# Patient Record
Sex: Female | Born: 1955 | Race: Black or African American | Hispanic: No | Smoking: Never smoker
Health system: Southern US, Community
[De-identification: ages and names within clinical notes are randomized; demographics above are authoritative.]

## PROBLEM LIST (undated history)

## (undated) DIAGNOSIS — F419 Anxiety disorder, unspecified: Secondary | ICD-10-CM

## (undated) DIAGNOSIS — Z124 Encounter for screening for malignant neoplasm of cervix: Secondary | ICD-10-CM

## (undated) DIAGNOSIS — M1990 Unspecified osteoarthritis, unspecified site: Secondary | ICD-10-CM

## (undated) DIAGNOSIS — K08109 Complete loss of teeth, unspecified cause, unspecified class: Secondary | ICD-10-CM

## (undated) DIAGNOSIS — G4730 Sleep apnea, unspecified: Secondary | ICD-10-CM

## (undated) DIAGNOSIS — K5730 Diverticulosis of large intestine without perforation or abscess without bleeding: Secondary | ICD-10-CM

## (undated) DIAGNOSIS — Z860100 Personal history of colon polyps, unspecified: Secondary | ICD-10-CM

## (undated) DIAGNOSIS — N760 Acute vaginitis: Secondary | ICD-10-CM

## (undated) DIAGNOSIS — E876 Hypokalemia: Secondary | ICD-10-CM

## (undated) DIAGNOSIS — K649 Unspecified hemorrhoids: Secondary | ICD-10-CM

## (undated) DIAGNOSIS — E785 Hyperlipidemia, unspecified: Secondary | ICD-10-CM

## (undated) DIAGNOSIS — I10 Essential (primary) hypertension: Secondary | ICD-10-CM

## (undated) DIAGNOSIS — S46819A Strain of other muscles, fascia and tendons at shoulder and upper arm level, unspecified arm, initial encounter: Secondary | ICD-10-CM

## (undated) DIAGNOSIS — B9689 Other specified bacterial agents as the cause of diseases classified elsewhere: Secondary | ICD-10-CM

## (undated) DIAGNOSIS — D509 Iron deficiency anemia, unspecified: Secondary | ICD-10-CM

## (undated) DIAGNOSIS — M79651 Pain in right thigh: Secondary | ICD-10-CM

## (undated) DIAGNOSIS — K8020 Calculus of gallbladder without cholecystitis without obstruction: Secondary | ICD-10-CM

## (undated) DIAGNOSIS — Z972 Presence of dental prosthetic device (complete) (partial): Secondary | ICD-10-CM

## (undated) DIAGNOSIS — G4733 Obstructive sleep apnea (adult) (pediatric): Secondary | ICD-10-CM

## (undated) DIAGNOSIS — M79652 Pain in left thigh: Secondary | ICD-10-CM

## (undated) DIAGNOSIS — G2581 Restless legs syndrome: Secondary | ICD-10-CM

## (undated) DIAGNOSIS — R0981 Nasal congestion: Secondary | ICD-10-CM

## (undated) DIAGNOSIS — E119 Type 2 diabetes mellitus without complications: Secondary | ICD-10-CM

## (undated) HISTORY — DX: Hypokalemia: E87.6

## (undated) HISTORY — PX: COLONOSCOPY WITH PROPOFOL: SHX5780

## (undated) HISTORY — DX: Pain in left thigh: M79.652

## (undated) HISTORY — DX: Nasal congestion: R09.81

## (undated) HISTORY — DX: Restless legs syndrome: G25.81

## (undated) HISTORY — DX: Other specified bacterial agents as the cause of diseases classified elsewhere: B96.89

## (undated) HISTORY — DX: Calculus of gallbladder without cholecystitis without obstruction: K80.20

## (undated) HISTORY — DX: Unspecified osteoarthritis, unspecified site: M19.90

## (undated) HISTORY — DX: Pain in right thigh: M79.651

## (undated) HISTORY — DX: Strain of other muscles, fascia and tendons at shoulder and upper arm level, unspecified arm, initial encounter: S46.819A

## (undated) HISTORY — DX: Encounter for screening for malignant neoplasm of cervix: Z12.4

## (undated) HISTORY — DX: Sleep apnea, unspecified: G47.30

## (undated) HISTORY — DX: Acute vaginitis: N76.0

## (undated) HISTORY — PX: ROTATOR CUFF REPAIR: SHX139

---

## 2009-05-30 ENCOUNTER — Emergency Department (HOSPITAL_COMMUNITY): Admission: EM | Admit: 2009-05-30 | Discharge: 2009-05-30 | Payer: Self-pay | Admitting: Emergency Medicine

## 2010-05-10 LAB — POCT I-STAT, CHEM 8
BUN: 7 mg/dL (ref 6–23)
Creatinine, Ser: 0.8 mg/dL (ref 0.4–1.2)
Hemoglobin: 12.2 g/dL (ref 12.0–15.0)
Potassium: 3.4 mEq/L — ABNORMAL LOW (ref 3.5–5.1)
Sodium: 143 mEq/L (ref 135–145)

## 2011-11-16 ENCOUNTER — Telehealth: Payer: Self-pay | Admitting: Pulmonary Disease

## 2011-11-16 NOTE — Telephone Encounter (Signed)
Forward 17 pages from [facility] Sleep Medicine to Dr. Marcelyn Bruins for review on 11-16-11 ym

## 2011-12-03 ENCOUNTER — Encounter: Payer: Self-pay | Admitting: Pulmonary Disease

## 2011-12-03 ENCOUNTER — Encounter: Payer: Self-pay | Admitting: *Deleted

## 2011-12-04 ENCOUNTER — Ambulatory Visit (INDEPENDENT_AMBULATORY_CARE_PROVIDER_SITE_OTHER): Payer: Medicaid Other | Admitting: Pulmonary Disease

## 2011-12-04 ENCOUNTER — Encounter: Payer: Self-pay | Admitting: Pulmonary Disease

## 2011-12-04 VITALS — BP 122/68 | HR 83 | Temp 98.1°F | Ht 61.25 in | Wt 252.6 lb

## 2011-12-04 DIAGNOSIS — G2581 Restless legs syndrome: Secondary | ICD-10-CM | POA: Insufficient documentation

## 2011-12-04 DIAGNOSIS — G4733 Obstructive sleep apnea (adult) (pediatric): Secondary | ICD-10-CM | POA: Insufficient documentation

## 2011-12-04 MED ORDER — ROPINIROLE HCL 0.5 MG PO TABS: ORAL_TABLET | ORAL | Status: DC

## 2011-12-04 NOTE — Assessment & Plan Note (Signed)
The patient has a history of moderate obstructive sleep apnea, and has done very well with CPAP in the past.  She is now having multiple issues with her mask, mechanical issues with her machine, and is overdue for different supplies.  I will send the order in to her durable medical equipment company to take care of this, and she will also need to change over to a full face mask in light of her history of mouth opening.  I have also stressed to her the importance of weight reduction.

## 2011-12-04 NOTE — Progress Notes (Signed)
Subjective:    Patient ID: Kristen Frost, female    DOB: 1956/01/22, 56 y.o.   MRN: [id number]  HPI The patient is a 56 year old female who comes in today as a self referral for management of sleep apnea and RLS.  She was diagnosed with moderate obstructive sleep apnea in 2011, with an AHI of 26 events per hour.  She was also found at that time to have large numbers of periodic limb movements, and a history that was consistent with the restless leg syndrome.  She underwent a CPAP titration study where her optimal pressure was 12 cm of water, and also was started on Mirapex with significant improvement in her leg symptoms.  The patient notes that she had significant improvement in her sleep and daytime alertness with treatment, but has moved from the [place] area and has not kept up with the maintenance on her machine.  She also is no longer taking a dopamine agonist.  The patient uses nasal pillows, but has great difficulty with mouth opening even with a chin strap.  Her mask is also worn with a hole in it.  The patient also is having difficulties with her heated humidifier, and is unsure how to operate the heating element.  She also complains that her humidifier may be leaking.  The patient feels that she was doing very well with CPAP until various mechanical issues began to develop.  She currently is not as rested in the mornings upon arising, and had some inappropriate daytime sleepiness.  She is also having issues with restless leg symptoms again, and even complains of significant pain.  I have explained to her that true leg pain is usually not associated with RLS.  The patient states that her weight is neutral from her original sleep study in 2011, and her Epworth score today is 10  Sleep Questionnaire: What time do you typically go to bed?( Between what hours) 9pm How long does it take you to fall asleep? 1hr How many times during the night do you wake up? 2 What time do you get out of bed to  start your day? 0600 Do you drive or operate heavy machinery in your occupation? No How much has your weight changed (up or down) over the past two years? (In pounds) 10 lb (4.536 kg) Have you ever had a sleep study before? Yes If yes, location of study? [place] Kitchen[facility]land KitchenDr [facility]asty Spillers If yes, date of study? approx 2010 Do you currently use CPAP? Yes If so, what pressure? 5 Do you wear oxygen at any time? No    Review of Systems  Constitutional: Positive for unexpected weight change. Negative for fever.  HENT: Negative for ear pain, nosebleeds, congestion, sore throat, rhinorrhea, sneezing, trouble swallowing, dental problem, postnasal drip and sinus pressure.   Eyes: Negative for redness and itching.  Respiratory: Positive for shortness of breath ( at rest//exertion). Negative for cough, chest tightness and wheezing.   Cardiovascular: Negative for palpitations and leg swelling.  Gastrointestinal: Negative for nausea and vomiting.       Acid reflux  Genitourinary: Negative for dysuria.  Musculoskeletal: Positive for joint swelling and arthralgias.  Skin: Negative for rash.  Neurological: Negative for headaches.  Hematological: Does not bruise/bleed easily.  Psychiatric/Behavioral: Positive for dysphoric mood. The patient is nervous/anxious.        Objective:   Physical Exam Constitutional:  Morbidly obese female, no acute distress  HENT:  Nares patent without discharge, mild septal deviation to the left  Oropharynx  without exudate, palate and uvula are thick and elongated.   Eyes:  Perrla, eomi, no scleral icterus  Neck:  No JVD, no TMG  Cardiovascular:  Normal rate, regular rhythm, no rubs or gallops.  No murmurs        Intact distal pulses  Pulmonary :  Normal breath sounds, no stridor or respiratory distress   No rales, rhonchi, or wheezing  Abdominal:  Soft, nondistended, bowel sounds present.  No tenderness noted.   Musculoskeletal:  mild lower extremity edema noted.  Lymph  Nodes:  No cervical lymphadenopathy noted  Skin:  No cyanosis noted  Neurologic:  Alert, appropriate, moves all 4 extremities without obvious deficit.         Assessment & Plan:

## 2011-12-04 NOTE — Assessment & Plan Note (Signed)
The patient has a history of periodic limb movements on her sleep study, and a history that is very suggestive of RLS.  She will need to be restarted on her dopamine agonist.

## 2011-12-04 NOTE — Patient Instructions (Addendum)
Will get you a new mask, filters, supplies, and get your humidifier checked. Work on [employer] loss Will start requip 0.5mg , take 1 or 2 AFTER DINNER each night. followup with me in 6 mos.

## 2012-06-04 ENCOUNTER — Ambulatory Visit: Payer: Medicaid Other | Admitting: Pulmonary Disease

## 2012-06-12 ENCOUNTER — Ambulatory Visit (INDEPENDENT_AMBULATORY_CARE_PROVIDER_SITE_OTHER): Payer: Medicaid Other | Admitting: Pulmonary Disease

## 2012-06-12 ENCOUNTER — Encounter: Payer: Self-pay | Admitting: Pulmonary Disease

## 2012-06-12 VITALS — BP 142/80 | HR 95 | Temp 98.0°F | Ht 61.5 in | Wt 261.2 lb

## 2012-06-12 DIAGNOSIS — G2581 Restless legs syndrome: Secondary | ICD-10-CM

## 2012-06-12 DIAGNOSIS — G4733 Obstructive sleep apnea (adult) (pediatric): Secondary | ICD-10-CM

## 2012-06-12 NOTE — Patient Instructions (Addendum)
Stay on cpap, and keep up with mask cushion changes and supplies. Increase heat on humidifier to increase moisture. Work on weight loss Please call if you have increased leg symptoms. followup with me in one year if doing well.

## 2012-06-12 NOTE — Assessment & Plan Note (Signed)
The patient feels that her symptoms have been adequately controlled since being on CPAP, and would like to stay off medications for now.  I have asked her to call me if her symptoms worsen.

## 2012-06-12 NOTE — Progress Notes (Signed)
  Subjective:    Patient ID: Kristen Frost, female    DOB: [birth year], 57 y.o.   MRN: [id number]  HPI The patient comes in today for followup of her obstructive sleep apnea.  She is wearing CPAP compliantly, and feels that she is doing well with the device.  Her only complaint is that of mouth dryness while wearing a full face mask, and she has not turned up the heat humidifier.  She was tried last visit on a dopamine agonist for her periodic limb movements, but she feels this problem has improved and does not require medication at this time.   Review of Systems  Constitutional: Negative for fever and unexpected weight change.  HENT: Positive for postnasal drip. Negative for ear pain, nosebleeds, congestion, sore throat, rhinorrhea, sneezing, trouble swallowing, dental problem and sinus pressure.   Eyes: Negative for redness and itching.  Respiratory: Negative for cough, chest tightness, shortness of breath and wheezing.        Mucus build up in throat in the mornings.  Cardiovascular: Negative for palpitations and leg swelling.  Gastrointestinal: Negative for nausea and vomiting.  Genitourinary: Negative for dysuria.  Musculoskeletal: Negative for joint swelling.  Skin: Negative for rash.  Neurological: Negative for headaches.  Hematological: Does not bruise/bleed easily.  Psychiatric/Behavioral: Positive for dysphoric mood. The patient is not nervous/anxious.        Objective:   Physical Exam Obese female in no acute distress Nose without purulence or discharge noted No skin breakdown or pressure necrosis from the CPAP mask Neck without lymphadenopathy or thyromegaly Lower extremities with minimal edema, no cyanosis Alert and oriented, moves all 4 extremities.  Does not appear to be sleepy.       Assessment & Plan:

## 2012-06-12 NOTE — Assessment & Plan Note (Signed)
The patient is doing well with CPAP overall, and has seen significant improvement in her sleep and daytime alertness.  She is having no issues with her mask or pressure, and I have asked her to increase the heat on her humidifier to see if this will improve.  I have also encouraged her work aggressively on weight loss.

## 2013-01-22 ENCOUNTER — Telehealth: Payer: Self-pay | Admitting: Pulmonary Disease

## 2013-01-22 NOTE — Telephone Encounter (Signed)
I called and made pt aware. Nothing further needed 

## 2013-01-22 NOTE — Telephone Encounter (Signed)
RLS typically does not cause leg PAIN, but rather a creepy crawly feeling that causes an urge to move the legs to improve. If she is feeling true pain, this is probably not RLS and needs to see primary md.  If she is having uncomfortable sensations or creepy crawlies, can try her back on the prior medication.

## 2013-01-22 NOTE — Telephone Encounter (Signed)
Spoke to pt. States that she is still having bilateral leg pain. Reports that the pain is worse during the day and at night. Wondering if she needs to be seen. Was advised by [facility] to call if her leg sx continued.  KC - please advise. Thanks.

## 2013-06-12 ENCOUNTER — Encounter (INDEPENDENT_AMBULATORY_CARE_PROVIDER_SITE_OTHER): Payer: Self-pay

## 2013-06-12 ENCOUNTER — Ambulatory Visit (INDEPENDENT_AMBULATORY_CARE_PROVIDER_SITE_OTHER): Payer: Medicaid Other | Admitting: Pulmonary Disease

## 2013-06-12 ENCOUNTER — Encounter: Payer: Self-pay | Admitting: Pulmonary Disease

## 2013-06-12 VITALS — BP 128/80 | HR 89 | Ht 61.5 in | Wt 251.6 lb

## 2013-06-12 DIAGNOSIS — G4733 Obstructive sleep apnea (adult) (pediatric): Secondary | ICD-10-CM

## 2013-06-12 DIAGNOSIS — G2581 Restless legs syndrome: Secondary | ICD-10-CM

## 2013-06-12 MED ORDER — ROPINIROLE HCL 0.5 MG PO TABS: 0.5000 mg | ORAL_TABLET | Freq: Three times a day (TID) | ORAL | Status: DC

## 2013-06-12 NOTE — Patient Instructions (Signed)
Will get you a new cpap machine with heated humidifier Will try you again on requip 0.5mg  one after dinner each night for next 4 weeks.  If this helps, and you wish to stay on the medication, we can call in a prescription for you Work on weight loss followup with me again in one year if doing well.

## 2013-06-12 NOTE — Assessment & Plan Note (Signed)
Patient has a history of RLS and has not wanted to take a dopamine agonist. She is now describing increased musculoskeletal pain, but still has her underlying restless legs as well. I have discussed this with her again, and she is willing to try a dopamine agonist. However, she understands this will not help her musculoskeletal discomfort.

## 2013-06-12 NOTE — Assessment & Plan Note (Signed)
The patient is wearing CPAP, but is having difficulties with dryness because her humidifier is not working. She tells me that she has an aged CPAP machine, and will need a replacement at this point. I've also encouraged her to work aggressively on weight loss.

## 2013-06-12 NOTE — Progress Notes (Signed)
   Subjective:    Patient ID: Kristen Frost, female    DOB: 02-Dec-1955, 58 y.o.   MRN: [id number]  HPI Patient comes in today for followup of her obstructive sleep apnea. She is wearing CPAP compliantly, but her heated humidifier is not functioning properly. This has led to an issue with dryness. She tells me that she has an aged device, and is in need of a replacement. She continues to have symptoms of RLS, but most recently has been having more musculoskeletal pain.   Review of Systems  Constitutional: Negative for fever and unexpected weight change.  HENT: Negative for congestion, dental problem, ear pain, nosebleeds, postnasal drip, rhinorrhea, sinus pressure, sneezing, sore throat and trouble swallowing.   Eyes: Negative for redness and itching.  Respiratory: Negative for cough, chest tightness, shortness of breath and wheezing.   Cardiovascular: Negative for palpitations and leg swelling.  Gastrointestinal: Negative for nausea and vomiting.  Genitourinary: Negative for dysuria.  Musculoskeletal: Negative for joint swelling.  Skin: Negative for rash.  Neurological: Negative for headaches.  Hematological: Does not bruise/bleed easily.  Psychiatric/Behavioral: Negative for dysphoric mood. The patient is not nervous/anxious.        Objective:   Physical Exam Overweight female in no acute distress Nose without purulence or discharge noted No skin breakdown or pressure necrosis from the CPAP mask Lower extremities without significant edema, no cyanosis Alert and oriented, moves all 4 extremities       Assessment & Plan:

## 2013-06-15 ENCOUNTER — Telehealth: Payer: Self-pay | Admitting: Pulmonary Disease

## 2013-06-15 NOTE — Telephone Encounter (Signed)
Why are they talking about fixing.  They are usually replaced yearly, and should be kept in stock so that pt can drop by to get one!!!  Do they not keep these in stock?

## 2013-06-15 NOTE — Telephone Encounter (Signed)
Spoke with Ivin Booty with [facility]  She states that they did receive our order for new CPAP machine with [facility]  However, she is not due for a new machine until Sept 2016 Her humidifier is broken on her current CPAP, and medicare will pay to fix this, but the pt has to pay for shipping  They have spoken with the pt and notified her of this, but she was unwilling to pay the 21 $ for shipping  This is just an FYI for [facility]

## 2013-06-17 NOTE — Telephone Encounter (Signed)
i am not talking about a cpap machine, but the heated humidifier which clicks into the front or side.  This are supposed to be replaced yearly!!  Pt thinks machine itself works fine!!

## 2013-06-17 NOTE — Telephone Encounter (Signed)
I called and spoke with Ivin Booty at [facility]; she states that Medicaid (and most all insurances) will cover a new CPAP machine every 5 years unless their current machine is not fixable or costs 50% or more of the cost of a new CPAP machine (Pt got her CPAP machine 10/2009). [facility] does have a warehouse they keep CPAP machines in stock to set patients up with same day. Also, pt would be given a loaner to use while her machine is being examined. Unfortunately there is no other option except patient to pay the cost of shipping to have her machine looked at to see if it can be fixed or if she needs a new one.   [place], I wanted to let you know this prior to me explaining this to the patient as well. Thanks.

## 2013-06-17 NOTE — Telephone Encounter (Signed)
Kristen Frost, I spoke with Ivin Booty at [facility] again; she states that the CPAP machine needs to be sent out to check because the connection to machine may be bad causing the sensor to the humidifier to not work. Won't know until the whole machine is checked.   I asked Ivin Booty if we could just give the patient a humidifier only to see if this will fix the problem and not have to send anything out as the patient feels her machine is fine;Ivin Booty states that they could not do that because Medicaid would question it and why the whole machine isn't looked at(why the patient need a new humidifier so soon-pt got her humidifier 10-2009).   I called patient to find out what type of machine she has-pt will need to speak with Joellen Jersey only please.

## 2013-06-19 NOTE — Telephone Encounter (Signed)
Called pt at [phone] - went directly to VM.   lmomtcb for pt

## 2013-06-23 NOTE — Telephone Encounter (Signed)
LMTCB-at patients number listed. I also called the EC on file-was told the person who picked up was the not the person on file nor did they have a way to contact the patient. When patient calls back she is to speak with Joellen Jersey. Thanks.

## 2013-06-24 NOTE — Telephone Encounter (Signed)
LMOMTCB x 2 listed number went straight to VM

## 2013-06-30 NOTE — Telephone Encounter (Signed)
I spoke with Ivin Booty at [facility] to see what was still needed with this patient. She states nothing is needed at this time. They arte going to take a look at the machine, humidifier and connection once the pt send the machine in. [place]on Bing, CMA

## 2014-06-14 ENCOUNTER — Ambulatory Visit: Payer: Medicaid Other | Admitting: Pulmonary Disease

## 2015-11-17 ENCOUNTER — Ambulatory Visit (INDEPENDENT_AMBULATORY_CARE_PROVIDER_SITE_OTHER): Payer: Medicaid Other | Admitting: Internal Medicine

## 2015-11-17 VITALS — BP 132/74 | HR 86 | Temp 98.6°F | Ht 61.5 in | Wt 258.5 lb

## 2015-11-17 DIAGNOSIS — Z9114 Patient's other noncompliance with medication regimen: Secondary | ICD-10-CM

## 2015-11-17 DIAGNOSIS — Z9989 Dependence on other enabling machines and devices: Secondary | ICD-10-CM | POA: Diagnosis not present

## 2015-11-17 DIAGNOSIS — K5900 Constipation, unspecified: Secondary | ICD-10-CM

## 2015-11-17 DIAGNOSIS — M79652 Pain in left thigh: Secondary | ICD-10-CM

## 2015-11-17 DIAGNOSIS — J069 Acute upper respiratory infection, unspecified: Secondary | ICD-10-CM | POA: Diagnosis not present

## 2015-11-17 DIAGNOSIS — M79651 Pain in right thigh: Secondary | ICD-10-CM

## 2015-11-17 DIAGNOSIS — K648 Other hemorrhoids: Secondary | ICD-10-CM | POA: Diagnosis not present

## 2015-11-17 DIAGNOSIS — I10 Essential (primary) hypertension: Secondary | ICD-10-CM

## 2015-11-17 DIAGNOSIS — G2581 Restless legs syndrome: Secondary | ICD-10-CM | POA: Diagnosis not present

## 2015-11-17 DIAGNOSIS — D649 Anemia, unspecified: Secondary | ICD-10-CM | POA: Diagnosis not present

## 2015-11-17 DIAGNOSIS — G4733 Obstructive sleep apnea (adult) (pediatric): Secondary | ICD-10-CM

## 2015-11-17 DIAGNOSIS — K649 Unspecified hemorrhoids: Secondary | ICD-10-CM

## 2015-11-17 MED ORDER — IBUPROFEN 800 MG PO TABS: 800.0000 mg | ORAL_TABLET | Freq: Three times a day (TID) | ORAL | 0 refills | Status: DC | PRN

## 2015-11-17 MED ORDER — GUAIFENESIN 200 MG PO TABS: 200.0000 mg | ORAL_TABLET | ORAL | 0 refills | Status: DC | PRN

## 2015-11-17 NOTE — Assessment & Plan Note (Addendum)
Unclear etiology. Obesity likely contributing. No weakness, no arthropathy. Occurs spontaneously and sporadically w/o clear inciting factors. Stabbing in nature. Will tx w/ NSAIDS, ibuprofen 81m TID. Check BMP and ESR.

## 2015-11-17 NOTE — Assessment & Plan Note (Signed)
Compliant w/ CPAP. Follows w/ Dr. Gwenette Greet.

## 2015-11-17 NOTE — Assessment & Plan Note (Signed)
Guaifenesin for post-nasal drip.

## 2015-11-17 NOTE — Patient Instructions (Signed)
Thanks so much for seeing me in the clinic today. It was really nice top meet you.  For your hemorrhoids and constipation, I would like to try taking a stool softener and laxative every day for several weeks. I've given your paper with a recommendation for senna-S, which you can find at [facility] for $5. Helping to control your constipation will help relieve pressure and allowing her body to heal the hemorrhoids. There were no signs of anal fissure on your exam today.  I do not think that your hemorrhoids are causing her to lose significant amounts of blood. I'd like to evaluate your blood count sincerely history of anemia. We will do some blood tests today and I will follow up on the results of those with you by phone.  In terms of your thigh pain, I would like you to continue taking the ibuprofen 800 mg as needed for pain. I'm not sure what's causing this but we will do some blood tests to try to help narrow down the cause. I think it will be reasonable to follow-up with you in several months unless her any abnormalities with your blood work.  Your blood pressure is doing well today. I don't think there is any reason to start you on a medication, but we will continue to follow your blood pressure at her next visit and if needed we can find an option to help lower your blood pressure that will not have any significant side effects.

## 2015-11-17 NOTE — Assessment & Plan Note (Signed)
H/o ropinerole. Not currently using and w/o significant complaint. Continue to monitor w/o therapy.

## 2015-11-17 NOTE — Assessment & Plan Note (Addendum)
H/o of medication with lisinopril and HCTZ. Currently non-compliant with both. Reports no tolerance of lisinopril d/t cough. Will continue to monitor as only mildly elevated today. Reassess at next visit and consider amlodipine if medication in indicated at that time. Will check BMP today.

## 2015-11-17 NOTE — Assessment & Plan Note (Signed)
H/o anemia w/ current Fe supplementation. Will recheck CBC today, Fe studies, B12. No apparent source of GI bleeding with negative FOBT today.

## 2015-11-17 NOTE — Assessment & Plan Note (Addendum)
Likely 2/2 constipation. Currently on Fe therapy which is contributing. Not painful except on occasion every 1-2 months relieved with Preparation H. No signs of fissures on exam, small internal hemorrhoids. Will relieve strain and constipation w/ Senna-S daily. Reassess at next visit in 3 months.

## 2015-11-17 NOTE — Progress Notes (Signed)
CC: Thigh pain, hemorrhoids, HTN, URI HPI: [name] is a 60 y.o. female with a h/o of RLS, OSA, HTN, anemia who presents to establish care and for evaluation of bilateral thigh pain, rectal bleeding, and post-nasal drip in addition to her chronic medical problems.  Please see Problem-based charting for HPI and the status of patient's chronic medical conditions.  Past Medical History:  Diagnosis Date  . Arthritis   . Restless leg syndrome   . Sleep apnea     Social Hx: Pt is currently unemployed and on a fixed income. She denies tobacco use. She reports attempting to be active and eat well, specifically she notes that she has a membership to [employer] which she hope to utilize more frequently.  Review of Systems: ROS in HPI. Otherwise: Review of Systems  Constitutional: Negative for chills, diaphoresis, fever, malaise/fatigue and weight loss.  HENT: Positive for sore throat (irritation 2/2 postnasal drip x 3 weeks). Negative for ear pain.   Respiratory: Negative for cough and shortness of breath.   Cardiovascular: Negative for chest pain and leg swelling.  Gastrointestinal: Positive for blood in stool (BRBPR small amount periodically). Negative for abdominal pain, constipation, diarrhea, nausea and vomiting.  Genitourinary: Negative for dysuria, frequency and urgency.  Musculoskeletal: Positive for myalgias (bilateral thigh pain).  Neurological: Negative for dizziness, tingling, focal weakness, weakness and headaches.    Physical Exam: Vitals:   11/17/15 1420  BP: 132/74  Pulse: 86  Temp: 98.6 F (37 C)  TempSrc: Oral  SpO2: 100%  Weight: 258 lb 8 oz (117.3 kg)  Height: 5' 1.5" (1.562 m)   Physical Exam  Constitutional: She is oriented to person, place, and time. She appears well-developed. She is cooperative. No distress.  Obese  HENT:  Right Ear: Hearing, tympanic membrane, external ear and ear canal normal.  Left Ear: Hearing normal. A foreign body  (unable to visualize TM 2/2 full blockage w/ cerumen) is present.  Nose: Nose normal.  Eyes: Conjunctivae and EOM are normal. Pupils are equal, round, and reactive to light.  Cardiovascular: Normal rate, regular rhythm, normal heart sounds and normal pulses.  Exam reveals no gallop.   No murmur heard. Pulmonary/Chest: Effort normal and breath sounds normal. No respiratory distress. She has no wheezes. She has no rhonchi. She has no rales. Breasts are symmetrical.  Abdominal: Soft. Bowel sounds are normal. There is no tenderness.  Genitourinary: Rectal exam shows internal hemorrhoid. Rectal exam shows no external hemorrhoid, no fissure, no mass, no tenderness, anal tone normal and guaiac negative stool.  Musculoskeletal: Normal range of motion. She exhibits tenderness (mild TTP over bilateral quadriceps). She exhibits no edema.  Neurological: She is alert and oriented to person, place, and time. She exhibits normal muscle tone.  Skin: Skin is warm and dry. No rash noted. No erythema.    Assessment & Plan:  See encounters tab for problem based medical decision making. Patient seen with Dr. Dareen Piano  URI (upper respiratory infection) Guaifenesin for post-nasal drip.  RLS (restless legs syndrome) H/o ropinerole. Not currently using and w/o significant complaint. Continue to monitor w/o therapy.  OSA (obstructive sleep apnea) Compliant w/ CPAP. Follows w/ Dr. Gwenette Greet.  Hemorrhoids Likely 2/2 constipation. Currently on Fe therapy which is contributing. Not painful except on occasion every 1-2 months relieved with Preparation H. No signs of fissures on exam, small internal hemorrhoids. Will relieve strain and constipation w/ Senna-S daily. Reassess at next visit in 3 months.  HTN (hypertension) H/o  of medication with lisinopril and HCTZ. Currently non-compliant with both. Reports no tolerance of lisinopril d/t cough. Will continue to monitor as only mildly elevated today. Reassess at next visit  and consider amlodipine if medication in indicated at that time. Will check BMP today.  Bilateral thigh pain Unclear etiology. Obesity likely contributing. No weakness, no arthropathy. Occurs spontaneously and sporadically w/o clear inciting factors. Stabbing in nature. Will tx w/ NSAIDS, ibuprofen 840m TID. Check BMP and ESR.  Anemia H/o anemia w/ current Fe supplementation. Will recheck CBC today, Fe studies, B12. No apparent source of GI bleeding with negative FOBT today.   Signed: BHolley Raring MD 11/17/2015, 6:46 PM  Pager: [phone]

## 2015-11-18 LAB — CBC
Hematocrit: 34.5 % (ref 34.0–46.6)
Hemoglobin: 11.3 g/dL (ref 11.1–15.9)
MCH: 27.6 pg (ref 26.6–33.0)
MCHC: 32.8 g/dL (ref 31.5–35.7)
MCV: 84 fL (ref 79–97)
PLATELETS: 283 10*3/uL (ref 150–379)
RBC: 4.1 x10E6/uL (ref 3.77–5.28)
RDW: 16.7 % — AB (ref 12.3–15.4)
WBC: 6.5 10*3/uL (ref 3.4–10.8)

## 2015-11-18 LAB — FERRITIN: Ferritin: 55 ng/mL (ref 15–150)

## 2015-11-18 LAB — BMP8+ANION GAP
Anion Gap: 15 mmol/L (ref 10.0–18.0)
BUN / CREAT RATIO: 12 (ref 12–28)
BUN: 13 mg/dL (ref 8–27)
CHLORIDE: 104 mmol/L (ref 96–106)
CO2: 25 mmol/L (ref 18–29)
Calcium: 9.4 mg/dL (ref 8.7–10.3)
Creatinine, Ser: 1.05 mg/dL — ABNORMAL HIGH (ref 0.57–1.00)
GFR calc Af Amer: 67 mL/min/{1.73_m2} (ref >59)
GFR calc non Af Amer: 58 mL/min/{1.73_m2} — ABNORMAL LOW (ref >59)
GLUCOSE: 100 mg/dL — AB (ref 65–99)
POTASSIUM: 3.8 mmol/L (ref 3.5–5.2)
Sodium: 144 mmol/L (ref 134–144)

## 2015-11-18 LAB — VITAMIN B12: VITAMIN B 12: 405 pg/mL (ref 211–946)

## 2015-11-18 LAB — IRON AND TIBC
IRON SATURATION: 21 % (ref 15–55)
Iron: 66 ug/dL (ref 27–159)
Total Iron Binding Capacity: 316 ug/dL (ref 250–450)
UIBC: 250 ug/dL (ref 131–425)

## 2015-11-18 LAB — SEDIMENTATION RATE: SED RATE: 5 mm/h (ref 0–40)

## 2015-11-21 NOTE — Progress Notes (Signed)
Internal Medicine Clinic Attending  I saw and evaluated the patient.  I personally confirmed the key portions of the history and exam documented by Dr. Strelow and I reviewed pertinent patient test results.  The assessment, diagnosis, and plan were formulated together and I agree with the documentation in the resident's note. 

## 2015-11-28 ENCOUNTER — Telehealth: Payer: Self-pay | Admitting: Licensed Clinical Social Worker

## 2015-11-28 ENCOUNTER — Telehealth: Payer: Self-pay | Admitting: Internal Medicine

## 2015-11-28 NOTE — Telephone Encounter (Signed)
Patient walked in today requesting a referral to a Psychologist. Please advise.

## 2015-11-28 NOTE — Telephone Encounter (Signed)
Thank you.  I will notify Ms. Curatola that she does not need a referral to access her Behavioral Health benefits through [employer].  I will assist with linking patient with providers.

## 2015-11-28 NOTE — Telephone Encounter (Signed)
Kristen Frost presented as a walk-in to [facility] requesting referral for behavioral health services.  CSW was not available at this time and attempted to contact Kristen Frost during [place] work hours.  CSW was unable to leave voice mail message. Pt will be informed no referral is needed to access her [place].  CSW will assist pt with providers and provide information to [facility] for additional referral information.

## 2015-11-30 NOTE — Telephone Encounter (Signed)
CSW placed call to [name].  Pt states she is looking to establish with a therapist.  [name] does not have a gender specific preference and would like someone close to her residence.  CSW offered to contact providers based on insurance provider directory or to send [name] information and listing for scheduling.  [name] prefers for CSW to send listing.  Pt notified referral is not required to establish with behavioral health providers.  Listing mailed today.

## 2016-01-03 ENCOUNTER — Encounter: Payer: Self-pay | Admitting: Pulmonary Disease

## 2016-01-03 ENCOUNTER — Ambulatory Visit (INDEPENDENT_AMBULATORY_CARE_PROVIDER_SITE_OTHER): Payer: Medicaid Other | Admitting: Pulmonary Disease

## 2016-01-03 VITALS — BP 130/74 | HR 84 | Ht 61.5 in | Wt 263.0 lb

## 2016-01-03 DIAGNOSIS — G4733 Obstructive sleep apnea (adult) (pediatric): Secondary | ICD-10-CM

## 2016-01-03 DIAGNOSIS — Z6841 Body Mass Index (BMI) 40.0 and over, adult: Secondary | ICD-10-CM | POA: Diagnosis not present

## 2016-01-03 NOTE — Progress Notes (Signed)
   Subjective:    Patient ID: Kristen Frost, female    DOB: 05-17-1955, 60 y.o.   MRN: [id number]  HPI    Review of Systems  Constitutional: Positive for unexpected weight change. Negative for fever.  HENT: Positive for postnasal drip. Negative for congestion, dental problem, ear pain, nosebleeds, rhinorrhea, sinus pressure, sneezing, sore throat and trouble swallowing.   Eyes: Negative for redness and itching.  Respiratory: Negative for cough, chest tightness, shortness of breath and wheezing.   Cardiovascular: Negative for palpitations and leg swelling.  Gastrointestinal: Negative for nausea and vomiting.  Genitourinary: Negative for dysuria.  Musculoskeletal: Negative for joint swelling.  Skin: Negative for rash.  Neurological: Negative for headaches.  Hematological: Does not bruise/bleed easily.  Psychiatric/Behavioral: Negative for dysphoric mood. The patient is nervous/anxious.        Objective:   Physical Exam        Assessment & Plan:

## 2016-01-03 NOTE — Patient Instructions (Signed)
Will arrange for in sleep study Will call to arrange for follow up after sleep study reviewed  

## 2016-01-03 NOTE — Progress Notes (Signed)
Current Outpatient Prescriptions on File Prior to Visit  Medication Sig  . ibuprofen (ADVIL,MOTRIN) 800 MG tablet Take 1 tablet (800 mg total) by mouth every 8 (eight) hours as needed.   No current facility-administered medications on file prior to visit.      Chief Complaint  Patient presents with  . sleep consult    fomer [place] pt last seen in 2015. pt last worn cpap over a year. pt c/o loud snoring, daytime sleepiness & restless sleep     Sleep tests PSG 08/20/09 >> AHI 26  Past medical history Arthritis, RLS  Past surgical history, Family history, Social history, Allergies all reviewed.  Vital Signs BP 130/74 (BP Location: Left Wrist, Cuff Size: Normal)   Pulse 84   Ht 5' 1.5" (1.562 m)   Wt 263 lb (119.3 kg)   SpO2 99%   BMI 48.89 kg/m   History of Present Illness Kristen Frost is a 59 y.o. female with obstructive sleep apnea.  She was previously seen by Dr. Gwenette Greet.  She has not worn CPAP for at least a year.  Her sleep has gotten worse.  She is snoring, and is restless at night.  She has been told she stops breathing while asleep.  She wakes up in the middle of dreams.  She goes to bed at 9 pm.  She falls asleep after 30 minutes.  She wakes up a few times to use the bathroom.  She gets up at 730 am.  She feels tired in the morning.  She has trouble sleeping on her back.  She is not using anything to help her sleep or stay awake.  Her Epworth 6 out of 24.  Physical Exam  General - No distress ENT - No sinus tenderness, no oral exudate, no LAN, MP 4, wears dentures Cardiac - s1s2 regular, no murmur Chest - No wheeze/rales/dullness Back - No focal tenderness Abd - Soft, non-tender Ext - No edema Neuro - Normal strength Skin - No rashes Psych - normal mood, and behavior   Assessment/Plan  Obstructive sleep apnea. - will arrange for in lab sleep study  Obesity. - discussed options to assist with weight loss   Patient Instructions  Will arrange for in  sleep study Will call to arrange for follow up after sleep study reviewed     Chesley Mires, MD [facility] Pager:  [phone] 01/03/2016, 2:08 PM

## 2016-01-05 ENCOUNTER — Other Ambulatory Visit: Payer: Self-pay | Admitting: Internal Medicine

## 2016-01-17 NOTE — Telephone Encounter (Signed)
Call from pt stating she's completely out of ibuprofen 800mg  .  Rx cheaper than buying over the counter.  Will send to attending for refill.Despina Hidden Cassady11/28/20173:30 PM

## 2016-02-26 ENCOUNTER — Encounter (HOSPITAL_BASED_OUTPATIENT_CLINIC_OR_DEPARTMENT_OTHER): Payer: Medicaid Other

## 2016-03-01 ENCOUNTER — Ambulatory Visit (INDEPENDENT_AMBULATORY_CARE_PROVIDER_SITE_OTHER): Payer: Medicaid Other | Admitting: Internal Medicine

## 2016-03-01 VITALS — BP 140/69 | HR 93 | Temp 97.6°F | Wt 265.1 lb

## 2016-03-01 DIAGNOSIS — Z6841 Body Mass Index (BMI) 40.0 and over, adult: Secondary | ICD-10-CM | POA: Diagnosis not present

## 2016-03-01 DIAGNOSIS — D649 Anemia, unspecified: Secondary | ICD-10-CM

## 2016-03-01 DIAGNOSIS — E119 Type 2 diabetes mellitus without complications: Secondary | ICD-10-CM | POA: Diagnosis not present

## 2016-03-01 DIAGNOSIS — G2581 Restless legs syndrome: Secondary | ICD-10-CM | POA: Diagnosis not present

## 2016-03-01 DIAGNOSIS — K649 Unspecified hemorrhoids: Secondary | ICD-10-CM

## 2016-03-01 DIAGNOSIS — G4733 Obstructive sleep apnea (adult) (pediatric): Secondary | ICD-10-CM

## 2016-03-01 DIAGNOSIS — I10 Essential (primary) hypertension: Secondary | ICD-10-CM | POA: Diagnosis not present

## 2016-03-01 DIAGNOSIS — M79651 Pain in right thigh: Secondary | ICD-10-CM

## 2016-03-01 DIAGNOSIS — M79652 Pain in left thigh: Secondary | ICD-10-CM

## 2016-03-01 LAB — GLUCOSE, CAPILLARY: Glucose-Capillary: 120 mg/dL — ABNORMAL HIGH (ref 65–99)

## 2016-03-01 LAB — POCT GLYCOSYLATED HEMOGLOBIN (HGB A1C): HEMOGLOBIN A1C: 6.6

## 2016-03-01 MED ORDER — MELOXICAM 7.5 MG PO TABS: 7.5000 mg | ORAL_TABLET | Freq: Every day | ORAL | 2 refills | Status: DC

## 2016-03-01 MED ORDER — ROPINIROLE HCL 0.5 MG PO TABS: 0.5000 mg | ORAL_TABLET | Freq: Every day | ORAL | 0 refills | Status: DC

## 2016-03-01 MED ORDER — AMLODIPINE BESYLATE 5 MG PO TABS: 5.0000 mg | ORAL_TABLET | Freq: Every day | ORAL | 11 refills | Status: DC

## 2016-03-01 NOTE — Assessment & Plan Note (Addendum)
Patient follows with Drs. new to pulmonology. Reportedly has not been wearing her CPAP since it broke some time ago. She has a split night sleep study scheduled for 04/25/2016. We will follow-up the results of this test. Given her long history of sleep apnea interval likely be positive. Uncontrolled sleep apnea may be contributing to her daytime sleepiness.  Plan: Follow-up sleep study

## 2016-03-01 NOTE — Assessment & Plan Note (Signed)
Patient is a long history of persistent syndrome including inability to keep her legs still, discomfort and jittery feeling which occurs specifically at night. She has a history of therapy with ropinirole 0.5 mg nightly and has had success from this. This was discontinued due to financial limitations several years ago. I discussed reinitiation of therapy given her significant daytime sleepiness which is at least in part caused by her restless leg symptoms. Patient is agreeable.  Plan: Initiate ropinirole 0.5 mg daily at bedtime

## 2016-03-01 NOTE — Assessment & Plan Note (Signed)
Blood count within normal limits 3 months ago, ferritin and B12 unremarkable. Patient complains of occasional hemorrhoidal bleeding. No significant anemia has resolved. Continue to follow.

## 2016-03-01 NOTE — Patient Instructions (Addendum)
We will check some blood work to look at your cholesterol and calculate your risk of heart attack and stroke.  Your blood pressures also mildly elevated and I would like to start a low-dose of pressure control medicine. This medicine is called Amlodipine he will take 5 mg 1 pill every day.  For your leg pain, please try a new medication called Meloxicam which you can take once daily when you have pain. You can also try several over-the-counter topical creams like icy-hot or capsicin cream.

## 2016-03-01 NOTE — Assessment & Plan Note (Signed)
Screening hemoglobin A1c showed 6.6. Patient qualifies for the diagnosis of mild diabetes. We will discuss with the patient possibility of starting oral insulin sensitizer and hypoglycemic with metformin 500 mg extended release.

## 2016-03-01 NOTE — Assessment & Plan Note (Addendum)
History of hypertension. Currently not on any antihypertensives. Blood pressure 140/69 today. The patient is not complaining of any chest pain, shortness of breath, headaches, changes in vision. We will plan to assess ASCVD risk with lipid panel and we will likely initiate a low-dose first-line non-dihydropyridine calcium channel blocker such as amlodipine 5 mg today. This and the results of her lipid panel that her ten-year risk profile. reported new jnc 2017 guidelines if her risk is greater than 10% we will plan to treat with a goal of less than 130/80.  Patient is currently not using CPAP for presumed OSA. Patient is strong history. This may be contributing to her mild elevated blood pressures. She is a sleep study scheduled as referenced above area and we will follow-up with the results of that.  Plan: Fasting lipid panel and hemoglobin A1c Start amlodipine 5 mg daily.  ADDENDUM: Nonfasting random glucose 120 and point-of-care hemoglobin A1c 6.6.

## 2016-03-01 NOTE — Assessment & Plan Note (Signed)
Patient continues to complain of intermittent episodes of bilateral thigh pain. This pain occurs in the front of her legs and generally presents bilaterally although occasionally can present with unilateral pain. She describes this pain as sharp and severe typically causing her to cry. She rates this pain almost as bad as childbirth. She reports episodes of this pain, as frequently as several times a week and as few as once a week. The symptoms have been going on for several years and do not appear to be worsening or resolving. The episodes typically come on abruptly and present with full magnitude of pain within 5-10 minutes. They typically resolve after 1-2 hours. She takes 800 mg ibuprofen which was prescribed at her last visit and feels that this might provide some relief limiting the total duration of each episode. She has not tried any other therapies. She denies any significant weakness in the legs, denies rash, denies any neuropathic symptoms such as numbness, tingling, burning. On review of systems patient has no other constitutional symptoms, or weight loss.  These episodes seem most consistent with acute musculoskeletal pain. It seems likely that there is some component of acute inflammatory reaction as they respond well to nonsteroidal anti-inflammatories, however at last evaluation face erythrocyte sedimentation rate was checked which was within normal limits. There are no electrolyte abnormalities on previous BMP. Seems unlikely to be related to myositis without weakness. Rheumatologic condition are possible but unlikely given the lack of other systemic involvement.  Plan: Switch from ibuprofen 800 mg to meloxicam 7.5 mg daily as needed for acute episodes of pain. Try over-the-counter topical therapy with icy hot and capsaicin cream. Use heat and cryotherapy.  Follow-up for success of these remedies. We would consider topical NSAID such as Voltaren gel if these over-the-counter remedies are  unsuccessful.  We also recommend continued focus on weight loss as there may be some component of chronic MSK trauma from significant obesity.

## 2016-03-01 NOTE — Assessment & Plan Note (Addendum)
Patient with a 7-1/2 pound weight gain since last visit 3 months ago. We spent roughly 7 minutes of the visit discussing weight loss strategies including dietary modification and exercise routines. We also discussed significant barriers to the patient's weight loss including lack of support from her family specifically her spouse. Reestablish some concrete goals to assist in the patient's weight loss and we'll plan to reassess these calls at her next visit. They are as follows:  15 min of exercise at least 5 days per week. Avoid sweet treats for snacks. Lose 10% of body wt. Roughly 27 pounds. Enroll in [employer].

## 2016-03-01 NOTE — Progress Notes (Signed)
CC: HTN f/u HPI: [name] is a 61 y.o. female with a h/o of h/o of RLS, OSA, HTN, anemia who presents for routine follow up of her BP.  Please see Problem-based charting for HPI and the status of patient's chronic medical conditions.  Past Medical History:  Diagnosis Date  . Arthritis   . Restless leg syndrome   . Sleep apnea    Social Hx: Social History  Substance Use Topics  . Smoking status: Never Smoker  . Smokeless tobacco: Not on file  . Alcohol use No   Review of Systems: ROS in HPI. Otherwise: Review of Systems  Constitutional: Positive for malaise/fatigue. Negative for chills, fever and weight loss.  Respiratory: Negative for cough and shortness of breath.   Cardiovascular: Negative for chest pain and leg swelling.  Gastrointestinal: Negative for abdominal pain, blood in stool, constipation, diarrhea, nausea and vomiting.  Genitourinary: Negative for dysuria, frequency and urgency.  Musculoskeletal: Positive for myalgias (bilateral thighs).  Neurological: Positive for tremors (RLS). Negative for weakness.  Psychiatric/Behavioral: The patient has insomnia.     Physical Exam: Vitals:   03/01/16 1341  BP: 140/69  Pulse: 93  Temp: 97.6 F (36.4 C)  TempSrc: Oral  SpO2: 100%  Weight: 265 lb 1.6 oz (120.2 kg)   Physical Exam  Constitutional: She appears well-developed. She is cooperative. No distress.  Obese  Cardiovascular: Normal rate, regular rhythm, normal heart sounds and normal pulses.  Exam reveals no gallop.   No murmur heard. Pulmonary/Chest: Effort normal and breath sounds normal. No respiratory distress. She has no wheezes. She has no rhonchi. She has no rales. Breasts are symmetrical.  Abdominal: Soft. Bowel sounds are normal. There is no tenderness.  Musculoskeletal: She exhibits no edema or tenderness.    Assessment & Plan:  See encounters tab for problem based medical decision making. Patient discussed with Dr. Dareen Piano  OSA  (obstructive sleep apnea) Patient follows with Drs. new to pulmonology. Reportedly has not been wearing her CPAP since it broke some time ago. She has a split night sleep study scheduled for 04/25/2016. We will follow-up the results of this test. Given her long history of sleep apnea interval likely be positive. Uncontrolled sleep apnea may be contributing to her daytime sleepiness.  Plan: Follow-up sleep study  HTN (hypertension) History of hypertension. Currently not on any antihypertensives. Blood pressure 140/69 today. The patient is not complaining of any chest pain, shortness of breath, headaches, changes in vision. We will plan to assess ASCVD risk with lipid panel and we will likely initiate a low-dose first-line non-dihydropyridine calcium channel blocker such as amlodipine 5 mg today. This and the results of her lipid panel that her ten-year risk profile. reported new jnc 2017 guidelines if her risk is greater than 10% we will plan to treat with a goal of less than 130/80.  Patient is currently not using CPAP for presumed OSA. Patient is strong history. This may be contributing to her mild elevated blood pressures. She is a sleep study scheduled as referenced above area and we will follow-up with the results of that.  Plan: Fasting lipid panel and hemoglobin A1c Start amlodipine 5 mg daily.  ADDENDUM: Nonfasting random glucose 120 and point-of-care hemoglobin A1c 6.6.  Anemia Blood count within normal limits 3 months ago, ferritin and B12 unremarkable. Patient complains of occasional hemorrhoidal bleeding. No significant anemia has resolved. Continue to follow.  Morbid obesity ([place]) Patient with a 7-1/2 pound weight gain since last visit  3 months ago. We spent roughly 7 minutes of the visit discussing weight loss strategies including dietary modification and exercise routines. We also discussed significant barriers to the patient's weight loss including lack of support from her family  specifically her spouse. Reestablish some concrete goals to assist in the patient's weight loss and we'll plan to reassess these calls at her next visit. They are as follows:  15 min of exercise at least 5 days per week. Avoid sweet treats for snacks. Lose 10% of body wt. Roughly 27 pounds. Enroll in [employer].  RLS (restless legs syndrome) Patient is a long history of persistent syndrome including inability to keep her legs still, discomfort and jittery feeling which occurs specifically at night. She has a history of therapy with ropinirole 0.5 mg nightly and has had success from this. This was discontinued due to financial limitations several years ago. I discussed reinitiation of therapy given her significant daytime sleepiness which is at least in part caused by her restless leg symptoms. Patient is agreeable.  Plan: Initiate ropinirole 0.5 mg daily at bedtime  Bilateral thigh pain Patient continues to complain of intermittent episodes of bilateral thigh pain. This pain occurs in the front of her legs and generally presents bilaterally although occasionally can present with unilateral pain. She describes this pain as sharp and severe typically causing her to cry. She rates this pain almost as bad as childbirth. She reports episodes of this pain, as frequently as several times a week and as few as once a week. The symptoms have been going on for several years and do not appear to be worsening or resolving. The episodes typically come on abruptly and present with full magnitude of pain within 5-10 minutes. They typically resolve after 1-2 hours. She takes 800 mg ibuprofen which was prescribed at her last visit and feels that this might provide some relief limiting the total duration of each episode. She has not tried any other therapies. She denies any significant weakness in the legs, denies rash, denies any neuropathic symptoms such as numbness, tingling, burning. On review of systems patient  has no other constitutional symptoms, or weight loss.  These episodes seem most consistent with acute musculoskeletal pain. It seems likely that there is some component of acute inflammatory reaction as they respond well to nonsteroidal anti-inflammatories, however at last evaluation face erythrocyte sedimentation rate was checked which was within normal limits. There are no electrolyte abnormalities on previous BMP. Seems unlikely to be related to myositis without weakness. Rheumatologic condition are possible but unlikely given the lack of other systemic involvement.  Plan: Switch from ibuprofen 800 mg to meloxicam 7.5 mg daily as needed for acute episodes of pain. Try over-the-counter topical therapy with icy hot and capsaicin cream. Use heat and cryotherapy.  Follow-up for success of these remedies. We would consider topical NSAID such as Voltaren gel if these over-the-counter remedies are unsuccessful.  We also recommend continued focus on weight loss as there may be some component of chronic MSK trauma from significant obesity.  T2DM (type 2 diabetes mellitus) (HCC) Screening hemoglobin A1c showed 6.6. Patient qualifies for the diagnosis of mild diabetes. We will discuss with the patient possibility of starting oral insulin sensitizer and hypoglycemic with metformin 500 mg extended release.   Signed: Holley Raring, MD 03/01/2016, 3:17 PM  Pager: [phone]

## 2016-03-02 ENCOUNTER — Telehealth: Payer: Self-pay | Admitting: Internal Medicine

## 2016-03-02 DIAGNOSIS — M79651 Pain in right thigh: Secondary | ICD-10-CM

## 2016-03-02 DIAGNOSIS — E119 Type 2 diabetes mellitus without complications: Secondary | ICD-10-CM

## 2016-03-02 DIAGNOSIS — E785 Hyperlipidemia, unspecified: Secondary | ICD-10-CM | POA: Insufficient documentation

## 2016-03-02 DIAGNOSIS — I10 Essential (primary) hypertension: Secondary | ICD-10-CM

## 2016-03-02 DIAGNOSIS — M79652 Pain in left thigh: Secondary | ICD-10-CM

## 2016-03-02 LAB — BMP8+ANION GAP
Anion Gap: 17 mmol/L (ref 10.0–18.0)
BUN / CREAT RATIO: 11 — AB (ref 12–28)
BUN: 13 mg/dL (ref 8–27)
CO2: 23 mmol/L (ref 18–29)
Calcium: 9.6 mg/dL (ref 8.7–10.3)
Chloride: 105 mmol/L (ref 96–106)
Creatinine, Ser: 1.23 mg/dL — ABNORMAL HIGH (ref 0.57–1.00)
GFR, EST AFRICAN AMERICAN: 55 mL/min/{1.73_m2} — AB (ref >59)
GFR, EST NON AFRICAN AMERICAN: 48 mL/min/{1.73_m2} — AB (ref >59)
Glucose: 106 mg/dL — ABNORMAL HIGH (ref 65–99)
POTASSIUM: 4.1 mmol/L (ref 3.5–5.2)
SODIUM: 145 mmol/L — AB (ref 134–144)

## 2016-03-02 LAB — LIPID PANEL
CHOL/HDL RATIO: 4.3 ratio (ref 0.0–4.4)
Cholesterol, Total: 209 mg/dL — ABNORMAL HIGH (ref 100–199)
HDL: 49 mg/dL (ref >39)
LDL CALC: 142 mg/dL — AB (ref 0–99)
TRIGLYCERIDES: 92 mg/dL (ref 0–149)
VLDL CHOLESTEROL CAL: 18 mg/dL (ref 5–40)

## 2016-03-02 MED ORDER — ATORVASTATIN CALCIUM 40 MG PO TABS: 40.0000 mg | ORAL_TABLET | Freq: Every day | ORAL | 11 refills | Status: DC

## 2016-03-02 MED ORDER — METFORMIN HCL 500 MG PO TABS
500.0000 mg | ORAL_TABLET | Freq: Two times a day (BID) | ORAL | 0 refills | Status: DC
Start: 2016-03-02 — End: 2016-03-15

## 2016-03-02 MED ORDER — ASPIRIN EC 81 MG PO TBEC: 81.0000 mg | DELAYED_RELEASE_TABLET | Freq: Every day | ORAL | 11 refills | Status: AC

## 2016-03-02 NOTE — Assessment & Plan Note (Signed)
Given worsening SCr to 1.25. Would recommend holding NSAIDS and favor topicals and APAP for thigh pain.

## 2016-03-02 NOTE — Telephone Encounter (Signed)
  Reason for call:   I placed an outgoing call to Ms. Obie Dredge at 11:45 AM regarding the results of her recent bloodwork.   Assessment/ Plan:   BMP demonstrates decline in renal function w/ SCr of 1.23 (normal BUN). I am concerned for potential iatrogenia in the setting of NSAID use for her recurrent thigh pain and also for possible HTN or DM damage. Screening A1c yesterday also revealed mild DM and lipid panel results in calculated 10-yr ASCVD risk of 16.8%.  All these results implicate the need for more aggressive management of cardiovascular risk and DM.  I recommend the following:  Metformin 500mg  BID w/ appropriate titration in 1 month  ASA 81mg  qD primary ppx  Atorvastatin 40mg  (would recommend close f/u in the setting of preexisting myalgias.  Urinalysis for evaluation of renal function decline and assessment of proteinuria.  Assessment of Vitamin D level prior to initiation of statin.  Hold oral NSAIDs, favor APAP and topicals for MSK pain.   Pt was scheduled for 6-8 wk f/u.   As always, pt is advised that if symptoms worsen or new symptoms arise, they should go to an urgent care facility or to to ER for further evaluation.   Holley Raring, MD   03/02/2016, 11:46 AM

## 2016-03-02 NOTE — Assessment & Plan Note (Signed)
ASCVD 16.8%. High intensity statin therapy indicated. ASA primary ppx indicated. Continue aggressive antihypertensive therapy for goal <130/80.  Plan: Atorva 40mg  qD ASA 81mg  qD

## 2016-03-02 NOTE — Telephone Encounter (Signed)
Patient has been scheduled first available with PCP on 05-17-16 @ 3:15 pm.  Appt mailed.

## 2016-03-02 NOTE — Progress Notes (Signed)
[facility] Attending  Case discussed with Dr. Strelow at the time of the visit.  We reviewed the resident's history and exam and pertinent patient test results.  I agree with the assessment, diagnosis, and plan of care documented in the resident's note.  

## 2016-03-06 ENCOUNTER — Other Ambulatory Visit (INDEPENDENT_AMBULATORY_CARE_PROVIDER_SITE_OTHER): Payer: Medicaid Other

## 2016-03-06 DIAGNOSIS — M79652 Pain in left thigh: Secondary | ICD-10-CM

## 2016-03-06 DIAGNOSIS — M79651 Pain in right thigh: Secondary | ICD-10-CM

## 2016-03-06 DIAGNOSIS — E785 Hyperlipidemia, unspecified: Secondary | ICD-10-CM | POA: Diagnosis present

## 2016-03-06 DIAGNOSIS — E119 Type 2 diabetes mellitus without complications: Secondary | ICD-10-CM

## 2016-03-07 LAB — URINALYSIS, ROUTINE W REFLEX MICROSCOPIC
BILIRUBIN UA: NEGATIVE
GLUCOSE, UA: NEGATIVE
KETONES UA: NEGATIVE
Leukocytes, UA: NEGATIVE
Nitrite, UA: NEGATIVE
PH UA: 5 (ref 5.0–7.5)
PROTEIN UA: NEGATIVE
RBC UA: NEGATIVE
Specific Gravity, UA: 1.015 (ref 1.005–1.030)
UUROB: 0.2 mg/dL (ref 0.2–1.0)

## 2016-03-07 LAB — CK: Total CK: 376 U/L — ABNORMAL HIGH (ref 24–173)

## 2016-03-07 LAB — VITAMIN D 25 HYDROXY (VIT D DEFICIENCY, FRACTURES): Vit D, 25-Hydroxy: 7.6 ng/mL — ABNORMAL LOW (ref 30.0–100.0)

## 2016-03-09 ENCOUNTER — Telehealth: Payer: Self-pay | Admitting: Internal Medicine

## 2016-03-09 DIAGNOSIS — M79652 Pain in left thigh: Principal | ICD-10-CM

## 2016-03-09 DIAGNOSIS — M79651 Pain in right thigh: Secondary | ICD-10-CM

## 2016-03-09 DIAGNOSIS — E559 Vitamin D deficiency, unspecified: Secondary | ICD-10-CM

## 2016-03-09 MED ORDER — VITAMIN D (ERGOCALCIFEROL) 1.25 MG (50000 UNIT) PO CAPS: 50000.0000 [IU] | ORAL_CAPSULE | ORAL | 0 refills | Status: AC

## 2016-03-09 MED ORDER — FERROUS SULFATE 325 (65 FE) MG PO TABS: 325.0000 mg | ORAL_TABLET | Freq: Every day | ORAL | 3 refills | Status: DC

## 2016-03-09 NOTE — Telephone Encounter (Signed)
  Reason for call:   I placed an outgoing call to [name] at 10:20 AM regarding her recent lab results.   Assessment/ Plan:   I spoke with the patient and informed her of her recent laboratory results. This included vitamin D level which was significantly low at 7.6. She has had mildly elevated CK at 376.  I suspect that these results may point towards an etiology for her muscle pain and we have spoken about the importance of repleting her vitamin D levels. This will be especially important in the setting of her new prescription for statin therapy, as statin related myalgias are leading cause of noncompliance.  Vitamin D deficiency Patient was found to have low vitamin D levels on laboratory evaluation.  Plan: High-dose vitamin D supplementation with 50,000 units weekly for 8 weeks.  Bilateral thigh pain Patient noted to have low vitamin D levels with mildly elevated CK. These laboratory values in the setting of her complaint of intermittent bilateral thigh muscle pain are suggestive of vitamin D deficiency myalgias.  Plan: We'll plan to replete vitamin D and recheck vitamin D levels and CK at follow-up visit in 8 weeks.   As always, pt is advised that if symptoms worsen or new symptoms arise, they should go to an urgent care facility or to to ER for further evaluation.   Holley Raring, MD   03/09/2016, 10:19 AM

## 2016-03-09 NOTE — Assessment & Plan Note (Signed)
Patient was found to have low vitamin D levels on laboratory evaluation.  Plan: High-dose vitamin D supplementation with 50,000 units weekly for 8 weeks.

## 2016-03-09 NOTE — Assessment & Plan Note (Signed)
Patient noted to have low vitamin D levels with mildly elevated CK. These laboratory values in the setting of her complaint of intermittent bilateral thigh muscle pain are suggestive of vitamin D deficiency myalgias.  Plan: We'll plan to replete vitamin D and recheck vitamin D levels and CK at follow-up visit in 8 weeks.

## 2016-03-15 ENCOUNTER — Other Ambulatory Visit: Payer: Self-pay | Admitting: Internal Medicine

## 2016-03-15 MED ORDER — METFORMIN HCL 500 MG PO TABS: 500.0000 mg | ORAL_TABLET | Freq: Two times a day (BID) | ORAL | 0 refills | Status: DC

## 2016-03-15 NOTE — Telephone Encounter (Signed)
metFORMIN (GLUCOPHAGE) 500 MG tablet [facility]

## 2016-03-29 ENCOUNTER — Other Ambulatory Visit: Payer: Self-pay | Admitting: Internal Medicine

## 2016-04-15 ENCOUNTER — Other Ambulatory Visit: Payer: Self-pay | Admitting: Internal Medicine

## 2016-04-17 NOTE — Telephone Encounter (Signed)
metFORMIN (GLUCOPHAGE) 500 MG tablet, refill request @ [facility] on [address].  

## 2016-04-25 ENCOUNTER — Ambulatory Visit (HOSPITAL_BASED_OUTPATIENT_CLINIC_OR_DEPARTMENT_OTHER): Payer: Medicaid Other | Attending: Pulmonary Disease | Admitting: Pulmonary Disease

## 2016-04-25 DIAGNOSIS — G4719 Other hypersomnia: Secondary | ICD-10-CM | POA: Insufficient documentation

## 2016-04-25 DIAGNOSIS — G4733 Obstructive sleep apnea (adult) (pediatric): Secondary | ICD-10-CM | POA: Diagnosis not present

## 2016-04-25 DIAGNOSIS — R0683 Snoring: Secondary | ICD-10-CM | POA: Insufficient documentation

## 2016-04-25 DIAGNOSIS — I10 Essential (primary) hypertension: Secondary | ICD-10-CM | POA: Diagnosis not present

## 2016-04-27 ENCOUNTER — Other Ambulatory Visit: Payer: Self-pay | Admitting: Internal Medicine

## 2016-05-03 ENCOUNTER — Other Ambulatory Visit: Payer: Self-pay | Admitting: *Deleted

## 2016-05-03 ENCOUNTER — Telehealth: Payer: Self-pay

## 2016-05-03 DIAGNOSIS — E559 Vitamin D deficiency, unspecified: Secondary | ICD-10-CM

## 2016-05-03 NOTE — Telephone Encounter (Signed)
Needs to speak with a nurse about vitamin D. Please call back.

## 2016-05-03 NOTE — Telephone Encounter (Signed)
Future lab order placed.  

## 2016-05-03 NOTE — Addendum Note (Signed)
Addended by: Holley Raring A on: 05/03/2016 02:16 PM   Modules accepted: Orders

## 2016-05-03 NOTE — Telephone Encounter (Signed)
Pt is coming tomorrow am for labs, please put lab orders in, thanks!

## 2016-05-03 NOTE — Telephone Encounter (Signed)
She needs to be seen for another Vit D level draw before deciding on refill.

## 2016-05-04 ENCOUNTER — Other Ambulatory Visit (INDEPENDENT_AMBULATORY_CARE_PROVIDER_SITE_OTHER): Payer: Medicaid Other

## 2016-05-04 DIAGNOSIS — E559 Vitamin D deficiency, unspecified: Secondary | ICD-10-CM

## 2016-05-04 NOTE — Telephone Encounter (Signed)
Have addressed this see note of 3/15

## 2016-05-05 LAB — VITAMIN D 25 HYDROXY (VIT D DEFICIENCY, FRACTURES): Vit D, 25-Hydroxy: 28.7 ng/mL — ABNORMAL LOW (ref 30.0–100.0)

## 2016-05-09 ENCOUNTER — Telehealth: Payer: Self-pay | Admitting: Internal Medicine

## 2016-05-09 DIAGNOSIS — E559 Vitamin D deficiency, unspecified: Secondary | ICD-10-CM

## 2016-05-09 MED ORDER — VITAMIN D 50 MCG (2000 UT) PO CAPS: 2000.0000 [IU] | ORAL_CAPSULE | Freq: Every day | ORAL | 0 refills | Status: DC

## 2016-05-09 NOTE — Assessment & Plan Note (Signed)
Recheck of Vit D level was 28 after high dose repletion. Will recommend continued supplementation at 2000 units daily.

## 2016-05-09 NOTE — Telephone Encounter (Signed)
  Reason for call:   I placed an outgoing call to [name] at 12:50 PM regarding her recent Vitamin D lab test.   Assessment/ Plan:   Pt answered the phone and I relayed the recent result of her Vit D level which was just below normal at 28. Pt has recently completed high dose supplementation w/ Vit D 50,000U qWk.  I recommended the pt continue daily supplementation with OTC Vit D at 2000U daily.  Questions were answered.  As always, pt is advised that if symptoms worsen or new symptoms arise, they should go to an urgent care facility or to to ER for further evaluation.   Holley Raring, MD   05/09/2016, 12:50 PM

## 2016-05-14 ENCOUNTER — Telehealth: Payer: Self-pay

## 2016-05-14 NOTE — Telephone Encounter (Signed)
Pt is having big problems w/ seasonal allergies, has appt wed will speak to dr then

## 2016-05-14 NOTE — Telephone Encounter (Signed)
Needs to speak with the nurse about allergy. Please call back.

## 2016-05-17 ENCOUNTER — Other Ambulatory Visit (HOSPITAL_COMMUNITY)
Admission: RE | Admit: 2016-05-17 | Discharge: 2016-05-17 | Disposition: A | Discharge status: Home / Self Care | Payer: Medicaid Other | Source: Ambulatory Visit | Attending: Oncology | Admitting: Oncology

## 2016-05-17 ENCOUNTER — Ambulatory Visit (INDEPENDENT_AMBULATORY_CARE_PROVIDER_SITE_OTHER): Payer: Medicaid Other | Admitting: Internal Medicine

## 2016-05-17 VITALS — BP 147/63 | HR 94 | Temp 98.1°F | Wt 260.3 lb

## 2016-05-17 DIAGNOSIS — J3489 Other specified disorders of nose and nasal sinuses: Secondary | ICD-10-CM | POA: Diagnosis not present

## 2016-05-17 DIAGNOSIS — Z8269 Family history of other diseases of the musculoskeletal system and connective tissue: Secondary | ICD-10-CM

## 2016-05-17 DIAGNOSIS — G479 Sleep disorder, unspecified: Secondary | ICD-10-CM

## 2016-05-17 DIAGNOSIS — Z0000 Encounter for general adult medical examination without abnormal findings: Secondary | ICD-10-CM | POA: Insufficient documentation

## 2016-05-17 DIAGNOSIS — M79651 Pain in right thigh: Secondary | ICD-10-CM | POA: Diagnosis not present

## 2016-05-17 DIAGNOSIS — Z6841 Body Mass Index (BMI) 40.0 and over, adult: Secondary | ICD-10-CM

## 2016-05-17 DIAGNOSIS — Z7984 Long term (current) use of oral hypoglycemic drugs: Secondary | ICD-10-CM

## 2016-05-17 DIAGNOSIS — E559 Vitamin D deficiency, unspecified: Secondary | ICD-10-CM

## 2016-05-17 DIAGNOSIS — I10 Essential (primary) hypertension: Secondary | ICD-10-CM

## 2016-05-17 DIAGNOSIS — Z825 Family history of asthma and other chronic lower respiratory diseases: Secondary | ICD-10-CM | POA: Diagnosis not present

## 2016-05-17 DIAGNOSIS — Z836 Family history of other diseases of the respiratory system: Secondary | ICD-10-CM

## 2016-05-17 DIAGNOSIS — Z820 Family history of epilepsy and other diseases of the nervous system: Secondary | ICD-10-CM | POA: Diagnosis not present

## 2016-05-17 DIAGNOSIS — Z124 Encounter for screening for malignant neoplasm of cervix: Secondary | ICD-10-CM | POA: Insufficient documentation

## 2016-05-17 DIAGNOSIS — Z840 Family history of diseases of the skin and subcutaneous tissue: Secondary | ICD-10-CM

## 2016-05-17 DIAGNOSIS — G4733 Obstructive sleep apnea (adult) (pediatric): Secondary | ICD-10-CM

## 2016-05-17 DIAGNOSIS — Z8249 Family history of ischemic heart disease and other diseases of the circulatory system: Secondary | ICD-10-CM | POA: Diagnosis not present

## 2016-05-17 DIAGNOSIS — Z811 Family history of alcohol abuse and dependence: Secondary | ICD-10-CM

## 2016-05-17 DIAGNOSIS — M79652 Pain in left thigh: Secondary | ICD-10-CM | POA: Diagnosis not present

## 2016-05-17 DIAGNOSIS — Z79899 Other long term (current) drug therapy: Secondary | ICD-10-CM

## 2016-05-17 DIAGNOSIS — Z823 Family history of stroke: Secondary | ICD-10-CM | POA: Diagnosis not present

## 2016-05-17 DIAGNOSIS — R0981 Nasal congestion: Secondary | ICD-10-CM | POA: Insufficient documentation

## 2016-05-17 DIAGNOSIS — E119 Type 2 diabetes mellitus without complications: Secondary | ICD-10-CM | POA: Diagnosis not present

## 2016-05-17 LAB — POCT GLYCOSYLATED HEMOGLOBIN (HGB A1C): HEMOGLOBIN A1C: 6.4

## 2016-05-17 LAB — GLUCOSE, CAPILLARY: GLUCOSE-CAPILLARY: 109 mg/dL — AB (ref 65–99)

## 2016-05-17 MED ORDER — METFORMIN HCL 500 MG PO TABS: ORAL_TABLET | ORAL | 0 refills | Status: DC

## 2016-05-17 MED ORDER — LISINOPRIL 5 MG PO TABS: 20.0000 mg | ORAL_TABLET | Freq: Every day | ORAL | 2 refills | Status: DC

## 2016-05-17 MED ORDER — FLUTICASONE PROPIONATE 50 MCG/ACT NA SUSP: 1.0000 | Freq: Every day | NASAL | 0 refills | Status: DC

## 2016-05-17 NOTE — Assessment & Plan Note (Signed)
Patient is a history of bilateral thigh pain which is unrelated to trauma. She had a normal CK. She has had low vitamin D levels may be contributing. We have tried meloxicam and ibuprofen in the past with limited success.  The patient's renal function has trended up on NSAID therapy and we discontinue this in favor of conservative therapies with rest ice and heat.  Assessment:   Plan: She may benefit from topical therapy such as lidocaine and as she had chills. We can also try topical NSAID therapy with Voltaren.

## 2016-05-17 NOTE — Assessment & Plan Note (Addendum)
Patient had low vitamin D levels and was started on high intensity vitamin D supplementations with 50,000 units weekly for 8 weeks. Recheck a vitamin D level showed mild deficiency and her therapy was transitioned to 2000 units daily. Patient reports she's been compliant with this new supplement regimen. Patient reports that her myalgias have resolved completely since starting her high dose vitamin D supplementation. We will plan to continue her on low-dose supplementation as her darkly pigmented skin and back of routine sun exposure put her at high risk for vitamin D deficiency chronically.  Assessment: Vitamin D deficiency with complaint of myalgias, now resolved, treated with vitamin D supplementation 2000 units daily  Plan: Continue current therapy

## 2016-05-17 NOTE — Assessment & Plan Note (Signed)
Was a 3 day history of sinus congestion. She denies any significant fevers, chills, cough, lung congestion. She reports some minor rhinorrhea. She has had some mild relief with Claritin, but was concerned about taking over-the-counter medications and increase her blood pressure. She was counseled on medications to avoid. She was also prescribed Flonase which she can use as needed for nasal congestion.  Assessment: Allergic versus viral sinusitis, will likely be self-limiting  Plan: Continue over-the-counter Claritin, and Flonase, prescription sent today.

## 2016-05-17 NOTE — Assessment & Plan Note (Addendum)
Patient was started on 5 mg amlodipine at her prior visit. Patient reports she has been compliant with this medication. She did note some mild lightheadedness initially when starting this medication, but reports that the symptoms have since resolved. Her blood pressure is still mildly elevated 147/67 today. She does resent and some mild anxiety for Pap smear which may be contributing to this. However the patient has tenderness of diabetes and would likely benefit from ACE inhibitor therapy both for control her blood pressure as well as for prevention of kidney disease.  Her creatinine had mild elevation at last check and we will plan to check microalbumin creatinine ratio today.  Assessment: Hypertension, uncontrolled currently on 5 mg amlodipine. Suspect some component of obstructive sleep apnea. Patient had sleep study scheduled for 04/25/2016.  Plan: Microalbumin and creatinine ratio. Start lisinopril 5 mg daily. Continue amlodipine 5 mg. Follow-up sleep study results.

## 2016-05-17 NOTE — Patient Instructions (Signed)
Glad to hear your thigh pain is improved. Please continue to take vitamin D supplements every day.  Congratulations on your weight loss 5 pounds is partial improvement please continue the good work. As you know, weight loss will help with all of your medical problems including her blood pressure diabetes and overall health.  I would like to add a new medication for your blood pressure in order to get this under better control as well as to protect her kidneys from your diabetes. This medication is called lisinopril you will take a low-dose, 5 mg, every day in addition to your other blood pressure medication. I would also like to increase your diabetes medication, metformin from 500 mg 2 times every day to 1000 mg 2 times every day. You can continue to use the pills you have an take 2 in the morning and 2 in the afternoon.  We will send her a letter with the results of her Pap smear. And I will update you with the results of the sleep study that you completed earlier this month when those tests results are available.

## 2016-05-17 NOTE — Addendum Note (Signed)
Addended by: Truddie Crumble on: 05/17/2016 04:25 PM   Modules accepted: Orders

## 2016-05-17 NOTE — Assessment & Plan Note (Addendum)
Patient has multiple clinical findings concerning for obstructive sleep apnea including a history of sleep apnea in 2013. She has morbid obesity, increased neck circumference, and complaint of daytime sleepiness. She had a sleep study scheduled for 04/25/2016.  Assessment: High clinical suspicion for obstructive sleep apnea. This may be contributing to her hypertension, diabetes, metabolic syndrome.  Plan: Follow-up results from sleep study, plan for probable CPAP.

## 2016-05-17 NOTE — Progress Notes (Signed)
Medicine attending: Medical history, presenting problems, physical findings, and medications, reviewed with resident physician Dr Holley Raring on the day of the patient visit and I concur with his evaluation and management plan.

## 2016-05-17 NOTE — Assessment & Plan Note (Addendum)
Patient is using sterile metformin 500 mg twice a day for A1c of 6.6. Patient has also been initiating lifestyle and dietary modifications and weight loss. She has lost 5 pounds since her last visit in January and is very motivated by this. We will plan to increase patient's metformin 2000 g twice a day in order to provide full dose insulin sensitivity. Patient very well and has been very compliant.  Assessment: Diabetes mellitus currently treated with metformin and lifestyle modifications  Plan: Point-of-care A1c. Plan to increase metformin to 1000 mg twice a day.

## 2016-05-17 NOTE — Progress Notes (Addendum)
CC: DM, HTN, OSA, RLS, Vit D Def HPI: [name] is a 61 y.o. female with a h/o of DM, HTN, OSA, RLS, Vit D Def who presents for follow up of the above conditions.  Please see Problem-based charting for HPI and the status of patient's chronic medical conditions.  Past Medical History:  Diagnosis Date  . Arthritis   . Restless leg syndrome   . Sleep apnea    Social History  Substance Use Topics  . Smoking status: Never Smoker  . Smokeless tobacco: Not on file  . Alcohol use No   Family History  Problem Relation Age of Onset  . Allergies Father   . Alcohol abuse Father   . Allergies Other     sibling  . Asthma Other     sibling  . Hypertension Other     sibling  . Migraines Other     sibling  . Rashes / Skin problems Other     sibling  . Gout Other     sibling  . Tuberculosis Father     deceased from TB   Review of Systems: ROS in HPI. Otherwise: Review of Systems  Constitutional: Negative for chills, fever and weight loss.  Respiratory: Negative for cough and shortness of breath.   Cardiovascular: Negative for chest pain and leg swelling.  Gastrointestinal: Negative for abdominal pain, constipation, diarrhea, nausea and vomiting.  Genitourinary: Negative for dysuria, frequency and urgency.   Physical Exam: Vitals:   05/17/16 1514  BP: (!) 147/63  Pulse: 94  Temp: 98.1 F (36.7 C)  TempSrc: Oral  SpO2: 98%  Weight: 260 lb 4.8 oz (118.1 kg)   Physical Exam  Constitutional: She appears well-developed. She is cooperative. No distress.  Cardiovascular: Normal rate, regular rhythm, normal heart sounds and normal pulses.  Exam reveals no gallop.   No murmur heard. Pulmonary/Chest: Effort normal and breath sounds normal. No respiratory distress. She has no wheezes. She has no rhonchi. She has no rales. Breasts are symmetrical.  Abdominal: Soft. Bowel sounds are normal. There is no tenderness.  Genitourinary: Vagina normal and uterus normal. Pelvic  exam was performed with patient supine. Cervix exhibits no motion tenderness and no discharge. No vaginal discharge found.  Musculoskeletal: She exhibits no edema.   Assessment & Plan:  See encounters tab for problem based medical decision making. Patient discussed with Dr. Beryle Beams  T2DM (type 2 diabetes mellitus) ([place]) Patient is using sterile metformin 500 mg twice a day for A1c of 6.6. Patient has also been initiating lifestyle and dietary modifications and weight loss. She has lost 5 pounds since her last visit in January and is very motivated by this. We will plan to increase patient's metformin 2000 g twice a day in order to provide full dose insulin sensitivity. Patient very well and has been very compliant.  Assessment: Diabetes mellitus currently treated with metformin and lifestyle modifications  Plan: Point-of-care A1c. Plan to increase metformin to 1000 mg twice a day.   Morbid obesity ([place]) Patient said several weight loss goals with specific lifestyle and dietary modifications to achieve these goals. She is motivated to this in order to help with her hypertension and diabetes and overall fitness. She has lost 5 pounds since her last visit in January 2018. She is very motivated by this. She was congratulated on his success and encouraged to continue this.  Assessment: Morbid obesity with recent 5 pound weight loss and good motivation  Plan: Continue his lifestyle and  dietary modifications continue to strive towards goals that were established in 03/01/2016 visit.  HTN (hypertension) Patient was started on 5 mg amlodipine at her prior visit. Patient reports she has been compliant with this medication. She did note some mild lightheadedness initially when starting this medication, but reports that the symptoms have since resolved. Her blood pressure is still mildly elevated 147/67 today. She does resent and some mild anxiety for Pap smear which may be contributing to this.  However the patient has tenderness of diabetes and would likely benefit from ACE inhibitor therapy both for control her blood pressure as well as for prevention of kidney disease.  Her creatinine had mild elevation at last check and we will plan to check microalbumin creatinine ratio today.  Assessment: Hypertension, uncontrolled currently on 5 mg amlodipine. Suspect some component of obstructive sleep apnea. Patient had sleep study scheduled for 04/25/2016.  Plan: Microalbumin and creatinine ratio. Start lisinopril 5 mg daily. Continue amlodipine 5 mg. Follow-up sleep study results.  Vitamin D deficiency Patient had low vitamin D levels and was started on high intensity vitamin D supplementations with 50,000 units weekly for 8 weeks. Recheck a vitamin D level showed mild deficiency and her therapy was transitioned to 2000 units daily. Patient reports she's been compliant with this new supplement regimen. Patient reports that her myalgias have resolved completely since starting her high dose vitamin D supplementation. We will plan to continue her on low-dose supplementation as her darkly pigmented skin and back of routine sun exposure put her at high risk for vitamin D deficiency chronically.  Assessment: Vitamin D deficiency with complaint of myalgias, now resolved, treated with vitamin D supplementation 2000 units daily  Plan: Continue current therapy  Bilateral thigh pain Patient is a history of bilateral thigh pain which is unrelated to trauma. She had a normal CK. She has had low vitamin D levels may be contributing. We have tried meloxicam and ibuprofen in the past with limited success.  The patient's renal function has trended up on NSAID therapy and we discontinue this in favor of conservative therapies with rest ice and heat.  Assessment:   Plan: She may benefit from topical therapy such as lidocaine and as she had chills. We can also try topical NSAID therapy with  Voltaren.  OSA (obstructive sleep apnea) Patient has multiple clinical findings concerning for obstructive sleep apnea including a history of sleep apnea in 2013. She has morbid obesity, increased neck circumference, and complaint of daytime sleepiness. She had a sleep study scheduled for 04/25/2016.  Assessment: High clinical suspicion for obstructive sleep apnea. This may be contributing to her hypertension, diabetes, metabolic syndrome.  Plan: Follow-up results from sleep study, plan for probable CPAP.  RLS (restless legs syndrome)    Pap smear for cervical cancer screening No recent Pap smear on file. Patient is 61 year old without significantly increased risk for cervical cancer. Should be screened every 2-3 years. Pap smear performed today and sent for analysis. No abnormalities on pelvic exam.  Assessment: Routine screening for cervical cancer w/ normal pelvic exam  Plan: f/u results and repeat screening in 2-3 years.  Sinus congestion Was a 3 day history of sinus congestion. She denies any significant fevers, chills, cough, lung congestion. She reports some minor rhinorrhea. She has had some mild relief with Claritin, but was concerned about taking over-the-counter medications and increase her blood pressure. She was counseled on medications to avoid. She was also prescribed Flonase which she can use as needed for nasal  congestion.  Assessment: Allergic versus viral sinusitis, will likely be self-limiting  Plan: Continue over-the-counter Claritin, and Flonase, prescription sent today.   Signed: Holley Raring, MD 05/17/2016, 4:00 PM  Pager: [phone]

## 2016-05-17 NOTE — Assessment & Plan Note (Signed)
No recent Pap smear on file. Patient is 61 year old without significantly increased risk for cervical cancer. Should be screened every 2-3 years. Pap smear performed today and sent for analysis. No abnormalities on pelvic exam.  Assessment: Routine screening for cervical cancer w/ normal pelvic exam  Plan: f/u results and repeat screening in 2-3 years.

## 2016-05-17 NOTE — Assessment & Plan Note (Addendum)
Patient said several weight loss goals with specific lifestyle and dietary modifications to achieve these goals. She is motivated to this in order to help with her hypertension and diabetes and overall fitness. She has lost 5 pounds since her last visit in January 2018. She is very motivated by this. She was congratulated on his success and encouraged to continue this.  Assessment: Morbid obesity with recent 5 pound weight loss and good motivation  Plan: Continue his lifestyle and dietary modifications continue to strive towards goals that were established in 03/01/2016 visit.

## 2016-05-18 LAB — CERVICOVAGINAL ANCILLARY ONLY: Wet Prep (BD Affirm): POSITIVE — AB

## 2016-05-18 LAB — MICROALBUMIN / CREATININE URINE RATIO
CREATININE, UR: 74.1 mg/dL
Microalb/Creat Ratio: 15.1 mg/g creat (ref 0.0–30.0)
Microalbumin, Urine: 11.2 ug/mL

## 2016-05-19 ENCOUNTER — Telehealth: Payer: Self-pay | Admitting: Internal Medicine

## 2016-05-19 DIAGNOSIS — B9689 Other specified bacterial agents as the cause of diseases classified elsewhere: Secondary | ICD-10-CM | POA: Insufficient documentation

## 2016-05-19 DIAGNOSIS — N760 Acute vaginitis: Principal | ICD-10-CM

## 2016-05-19 MED ORDER — METRONIDAZOLE 500 MG PO TABS: 2000.0000 mg | ORAL_TABLET | Freq: Once | ORAL | 0 refills | Status: AC

## 2016-05-19 NOTE — Assessment & Plan Note (Signed)
Found on wet prep. Fishy odor, but otherwise no significant symptoms. Will treat with one time dose of 2g Flagyl.

## 2016-05-19 NOTE — Telephone Encounter (Signed)
  Reason for call:   I placed an outgoing call to [name] at 12 PM regarding test results.   Assessment/ Plan:   She was notified of improvement in her Hgb A1c and congratulated on success at diet and exercise contributing to wt loss.  She was also notified of BV and Rx sent to pharmacy for one time 2g Flagyl.  Pap results to be mailed when available.  As always, pt is advised that if symptoms worsen or new symptoms arise, they should go to an urgent care facility or to to ER for further evaluation.   Kristen Raring, MD   05/19/2016, 11:58 AM

## 2016-05-22 ENCOUNTER — Encounter (HOSPITAL_COMMUNITY): Payer: Self-pay | Admitting: Internal Medicine

## 2016-05-22 LAB — CYTOLOGY - PAP: Diagnosis: NEGATIVE

## 2016-05-23 ENCOUNTER — Telehealth: Payer: Self-pay | Admitting: Pulmonary Disease

## 2016-05-23 NOTE — Telephone Encounter (Signed)
Dr. Elsworth Soho  Please Advise in VS's absence-  Pt called in wanting the results of her sleep study. Informed the pt that VS will not be in the office until next week and she stated she is has been waiting over a month and wanted to see if someone else will read the study. She is concerned because she needs a new machine due to her being broken.

## 2016-05-24 NOTE — Telephone Encounter (Signed)
Sleep study has not been read yet. Please call sleep lab and have them place in my box and I will try to read tomorrow

## 2016-05-24 NOTE — Telephone Encounter (Signed)
Sleep lab faxed over the study and this has been placed in RA look at box.

## 2016-05-24 NOTE — Telephone Encounter (Signed)
Called sleep lab to have sleep study placed in [facility] inbox.    Forwarding back to RA to follow up on.

## 2016-05-24 NOTE — Telephone Encounter (Signed)
Please ask them to place study in my '[facility] ' inbox

## 2016-05-24 NOTE — Telephone Encounter (Signed)
Called the sleep lab and they will fax this over to the triage fax.  Will place in RA box once received.

## 2016-05-29 NOTE — Telephone Encounter (Signed)
Not done yet Please call sleep lab again

## 2016-05-29 NOTE — Telephone Encounter (Signed)
Sorry - not in my inbox yet - cannot read until I get to see it

## 2016-05-29 NOTE — Telephone Encounter (Signed)
Called [facility] to have sleep study added to [employer]. Kya states that the results are in the box on her end.  Will send to Dr Elsworth Soho to make aware -- she is going to check with their tech to see if there is a glitch or something but she is showing they are in the box.

## 2016-05-29 NOTE — Telephone Encounter (Signed)
[place] for Kristen Frost to advise on why we still cannot see the sleep study

## 2016-05-30 NOTE — Telephone Encounter (Signed)
Spoke with Kristen Frost  She states the results should be there now  They are also in Epic under procedures  Please advise if you are still unable to see them

## 2016-06-01 NOTE — Telephone Encounter (Signed)
VS please advise of the sleep study results.  RA has been trying to get these results but they will not show up in his box.  thanks

## 2016-06-01 NOTE — Telephone Encounter (Signed)
Called and spoke with Coralyn Mark from the sleep lab.  She stated that she will route the sleep study to VS in EPIC and in [place].  Will forward back to VS to make him aware.

## 2016-06-01 NOTE — Telephone Encounter (Signed)
I do not have anything in my in box regarding this.  Please call to the sleep lab to have them route test to Dr. Elsworth Soho or to me.

## 2016-06-01 NOTE — Telephone Encounter (Signed)
Noted.  Will call patient once study reviewed.

## 2016-06-01 NOTE — Telephone Encounter (Signed)
Not in my box Pl send to dr sood since he is back onw

## 2016-06-05 ENCOUNTER — Emergency Department (HOSPITAL_COMMUNITY): Payer: Medicaid Other

## 2016-06-05 ENCOUNTER — Inpatient Hospital Stay (HOSPITAL_COMMUNITY): Payer: Medicaid Other

## 2016-06-05 ENCOUNTER — Inpatient Hospital Stay (HOSPITAL_COMMUNITY)
Admission: EM | Admit: 2016-06-05 | Discharge: 2016-06-09 | DRG: 418 | Disposition: A | Discharge status: Home / Self Care | Payer: Medicaid Other | Attending: Internal Medicine | Admitting: Internal Medicine

## 2016-06-05 ENCOUNTER — Encounter (HOSPITAL_COMMUNITY): Payer: Self-pay

## 2016-06-05 DIAGNOSIS — Z825 Family history of asthma and other chronic lower respiratory diseases: Secondary | ICD-10-CM

## 2016-06-05 DIAGNOSIS — G2581 Restless legs syndrome: Secondary | ICD-10-CM | POA: Diagnosis present

## 2016-06-05 DIAGNOSIS — E119 Type 2 diabetes mellitus without complications: Secondary | ICD-10-CM | POA: Diagnosis present

## 2016-06-05 DIAGNOSIS — E669 Obesity, unspecified: Secondary | ICD-10-CM | POA: Diagnosis not present

## 2016-06-05 DIAGNOSIS — J984 Other disorders of lung: Secondary | ICD-10-CM | POA: Diagnosis not present

## 2016-06-05 DIAGNOSIS — Z9049 Acquired absence of other specified parts of digestive tract: Secondary | ICD-10-CM | POA: Diagnosis not present

## 2016-06-05 DIAGNOSIS — E876 Hypokalemia: Secondary | ICD-10-CM | POA: Diagnosis present

## 2016-06-05 DIAGNOSIS — Z8269 Family history of other diseases of the musculoskeletal system and connective tissue: Secondary | ICD-10-CM

## 2016-06-05 DIAGNOSIS — Z8719 Personal history of other diseases of the digestive system: Secondary | ICD-10-CM | POA: Diagnosis not present

## 2016-06-05 DIAGNOSIS — K449 Diaphragmatic hernia without obstruction or gangrene: Secondary | ICD-10-CM | POA: Diagnosis present

## 2016-06-05 DIAGNOSIS — Z831 Family history of other infectious and parasitic diseases: Secondary | ICD-10-CM | POA: Diagnosis not present

## 2016-06-05 DIAGNOSIS — K8020 Calculus of gallbladder without cholecystitis without obstruction: Principal | ICD-10-CM | POA: Diagnosis present

## 2016-06-05 DIAGNOSIS — K760 Fatty (change of) liver, not elsewhere classified: Secondary | ICD-10-CM

## 2016-06-05 DIAGNOSIS — Z7982 Long term (current) use of aspirin: Secondary | ICD-10-CM | POA: Diagnosis not present

## 2016-06-05 DIAGNOSIS — E785 Hyperlipidemia, unspecified: Secondary | ICD-10-CM | POA: Diagnosis present

## 2016-06-05 DIAGNOSIS — M1990 Unspecified osteoarthritis, unspecified site: Secondary | ICD-10-CM | POA: Diagnosis not present

## 2016-06-05 DIAGNOSIS — Z6841 Body Mass Index (BMI) 40.0 and over, adult: Secondary | ICD-10-CM | POA: Diagnosis not present

## 2016-06-05 DIAGNOSIS — J9811 Atelectasis: Secondary | ICD-10-CM | POA: Diagnosis present

## 2016-06-05 DIAGNOSIS — Z7984 Long term (current) use of oral hypoglycemic drugs: Secondary | ICD-10-CM

## 2016-06-05 DIAGNOSIS — E559 Vitamin D deficiency, unspecified: Secondary | ICD-10-CM | POA: Diagnosis not present

## 2016-06-05 DIAGNOSIS — I959 Hypotension, unspecified: Secondary | ICD-10-CM | POA: Diagnosis not present

## 2016-06-05 DIAGNOSIS — Z794 Long term (current) use of insulin: Secondary | ICD-10-CM

## 2016-06-05 DIAGNOSIS — Z811 Family history of alcohol abuse and dependence: Secondary | ICD-10-CM

## 2016-06-05 DIAGNOSIS — K838 Other specified diseases of biliary tract: Secondary | ICD-10-CM

## 2016-06-05 DIAGNOSIS — Z8489 Family history of other specified conditions: Secondary | ICD-10-CM

## 2016-06-05 DIAGNOSIS — Z79899 Other long term (current) drug therapy: Secondary | ICD-10-CM | POA: Diagnosis not present

## 2016-06-05 DIAGNOSIS — R1011 Right upper quadrant pain: Secondary | ICD-10-CM

## 2016-06-05 DIAGNOSIS — I10 Essential (primary) hypertension: Secondary | ICD-10-CM | POA: Diagnosis not present

## 2016-06-05 DIAGNOSIS — Z820 Family history of epilepsy and other diseases of the nervous system: Secondary | ICD-10-CM

## 2016-06-05 DIAGNOSIS — G4733 Obstructive sleep apnea (adult) (pediatric): Secondary | ICD-10-CM | POA: Diagnosis present

## 2016-06-05 DIAGNOSIS — J309 Allergic rhinitis, unspecified: Secondary | ICD-10-CM | POA: Diagnosis not present

## 2016-06-05 HISTORY — DX: Essential (primary) hypertension: I10

## 2016-06-05 LAB — URINALYSIS, ROUTINE W REFLEX MICROSCOPIC
BILIRUBIN URINE: NEGATIVE
GLUCOSE, UA: NEGATIVE mg/dL
Ketones, ur: NEGATIVE mg/dL
LEUKOCYTES UA: NEGATIVE
NITRITE: NEGATIVE
PH: 5 (ref 5.0–8.0)
Protein, ur: NEGATIVE mg/dL
SPECIFIC GRAVITY, URINE: 1.012 (ref 1.005–1.030)

## 2016-06-05 LAB — CBC
HEMATOCRIT: 34.4 % — AB (ref 36.0–46.0)
Hemoglobin: 11.3 g/dL — ABNORMAL LOW (ref 12.0–15.0)
MCH: 25.5 pg — ABNORMAL LOW (ref 26.0–34.0)
MCHC: 32.8 g/dL (ref 30.0–36.0)
MCV: 77.7 fL — ABNORMAL LOW (ref 78.0–100.0)
PLATELETS: 289 10*3/uL (ref 150–400)
RBC: 4.43 MIL/uL (ref 3.87–5.11)
RDW: 16.6 % — AB (ref 11.5–15.5)
WBC: 8.3 10*3/uL (ref 4.0–10.5)

## 2016-06-05 LAB — COMPREHENSIVE METABOLIC PANEL
ALT: 104 U/L — ABNORMAL HIGH (ref 14–54)
AST: 204 U/L — AB (ref 15–41)
Albumin: 3.8 g/dL (ref 3.5–5.0)
Alkaline Phosphatase: 90 U/L (ref 38–126)
Anion gap: 12 (ref 5–15)
BILIRUBIN TOTAL: 1 mg/dL (ref 0.3–1.2)
BUN: 9 mg/dL (ref 6–20)
CO2: 25 mmol/L (ref 22–32)
Calcium: 9.4 mg/dL (ref 8.9–10.3)
Chloride: 102 mmol/L (ref 101–111)
Creatinine, Ser: 1.07 mg/dL — ABNORMAL HIGH (ref 0.44–1.00)
GFR calc Af Amer: >60 mL/min (ref >60)
GFR calc non Af Amer: 55 mL/min — ABNORMAL LOW (ref >60)
Glucose, Bld: 150 mg/dL — ABNORMAL HIGH (ref 65–99)
POTASSIUM: 3.6 mmol/L (ref 3.5–5.1)
SODIUM: 139 mmol/L (ref 135–145)
TOTAL PROTEIN: 7.3 g/dL (ref 6.5–8.1)

## 2016-06-05 LAB — GLUCOSE, CAPILLARY
Glucose-Capillary: 129 mg/dL — ABNORMAL HIGH (ref 65–99)
Glucose-Capillary: 116 mg/dL — ABNORMAL HIGH (ref 65–99)

## 2016-06-05 LAB — LIPASE, BLOOD: Lipase: 19 U/L (ref 11–51)

## 2016-06-05 MED ORDER — HYDROMORPHONE HCL 1 MG/ML IJ SOLN
0.5000 mg | INTRAMUSCULAR | Status: DC | PRN
  Administered 2016-06-05: 0.5 mg via INTRAVENOUS
  Filled 2016-06-05: qty 1

## 2016-06-05 MED ORDER — LORAZEPAM 2 MG/ML IJ SOLN
1.0000 mg | Freq: Once | INTRAMUSCULAR | Status: AC
  Administered 2016-06-05: 1 mg via INTRAVENOUS
  Filled 2016-06-05: qty 1

## 2016-06-05 MED ORDER — HYDROMORPHONE HCL 1 MG/ML IJ SOLN
1.0000 mg | Freq: Once | INTRAMUSCULAR | Status: AC
  Administered 2016-06-05: 1 mg via INTRAVENOUS
  Filled 2016-06-05: qty 1

## 2016-06-05 MED ORDER — ONDANSETRON HCL 4 MG/2ML IJ SOLN: 4.0000 mg | Freq: Four times a day (QID) | INTRAMUSCULAR | Status: DC | PRN

## 2016-06-05 MED ORDER — AMLODIPINE BESYLATE 5 MG PO TABS
5.0000 mg | ORAL_TABLET | Freq: Every day | ORAL | Status: DC
  Administered 2016-06-05 – 2016-06-09 (×4): 5 mg via ORAL
  Filled 2016-06-05 (×5): qty 1

## 2016-06-05 MED ORDER — SENNOSIDES-DOCUSATE SODIUM 8.6-50 MG PO TABS: 1.0000 | ORAL_TABLET | Freq: Every evening | ORAL | Status: DC | PRN

## 2016-06-05 MED ORDER — ONDANSETRON HCL 4 MG/2ML IJ SOLN
4.0000 mg | Freq: Once | INTRAMUSCULAR | Status: AC
  Administered 2016-06-05: 4 mg via INTRAVENOUS
  Filled 2016-06-05: qty 2

## 2016-06-05 MED ORDER — LISINOPRIL 20 MG PO TABS
20.0000 mg | ORAL_TABLET | Freq: Every day | ORAL | Status: DC
  Administered 2016-06-05 – 2016-06-07 (×3): 20 mg via ORAL
  Filled 2016-06-05 (×3): qty 1

## 2016-06-05 MED ORDER — GADOBENATE DIMEGLUMINE 529 MG/ML IV SOLN
20.0000 mL | Freq: Once | INTRAVENOUS | Status: AC | PRN
  Administered 2016-06-05: 20 mL via INTRAVENOUS

## 2016-06-05 MED ORDER — MORPHINE SULFATE (PF) 4 MG/ML IV SOLN
4.0000 mg | Freq: Once | INTRAVENOUS | Status: AC
  Administered 2016-06-05: 4 mg via INTRAVENOUS
  Filled 2016-06-05: qty 1

## 2016-06-05 MED ORDER — KETOROLAC TROMETHAMINE 15 MG/ML IJ SOLN
15.0000 mg | Freq: Four times a day (QID) | INTRAMUSCULAR | Status: DC | PRN
  Administered 2016-06-05: 15 mg via INTRAVENOUS
  Filled 2016-06-05: qty 1

## 2016-06-05 MED ORDER — INSULIN ASPART 100 UNIT/ML ~~LOC~~ SOLN
0.0000 [IU] | Freq: Three times a day (TID) | SUBCUTANEOUS | Status: DC
  Administered 2016-06-08 – 2016-06-09 (×4): 1 [IU] via SUBCUTANEOUS

## 2016-06-05 MED ORDER — ATORVASTATIN CALCIUM 40 MG PO TABS
40.0000 mg | ORAL_TABLET | Freq: Every day | ORAL | Status: DC
  Administered 2016-06-05 – 2016-06-09 (×5): 40 mg via ORAL
  Filled 2016-06-05 (×5): qty 1

## 2016-06-05 MED ORDER — SODIUM CHLORIDE 0.9 % IV BOLUS (SEPSIS)
1000.0000 mL | Freq: Once | INTRAVENOUS | Status: AC
  Administered 2016-06-05: 1000 mL via INTRAVENOUS

## 2016-06-05 MED ORDER — SODIUM CHLORIDE 0.9 % IV SOLN
INTRAVENOUS | Status: DC
  Administered 2016-06-05 – 2016-06-06 (×2): via INTRAVENOUS

## 2016-06-05 MED ORDER — ENOXAPARIN SODIUM 40 MG/0.4ML ~~LOC~~ SOLN
40.0000 mg | SUBCUTANEOUS | Status: DC
  Administered 2016-06-06 – 2016-06-07 (×2): 40 mg via SUBCUTANEOUS
  Filled 2016-06-05 (×2): qty 0.4

## 2016-06-05 MED ORDER — ONDANSETRON HCL 4 MG PO TABS: 4.0000 mg | ORAL_TABLET | Freq: Four times a day (QID) | ORAL | Status: DC | PRN

## 2016-06-05 NOTE — ED Notes (Signed)
Pt asked to be medicated prior to scan. EDP notified.

## 2016-06-05 NOTE — H&P (Signed)
Date: 06/05/2016               Patient Name:  Kristen Frost MRN: [id number]  DOB: Aug 23, 1955 Age / Sex: 61 y.o., female   PCP: Holley Raring, MD              Medical Service: Internal Medicine Teaching Service              Attending Physician: Dr. Aldine Contes, MD    First Contact: Malen Gauze, MS IV Pager: [phone]  Second Contact: Dr. Zada Finders Pager: [phone]            After Hours (After 5p/  First Contact Pager: [phone]  weekends / holidays): Second Contact Pager: [phone]   Chief Complaint: RUQ pain  History of Present Illness:  Kristen Frost is a 61 y.o female with PMHx HTN, T2DM, morbid obesity, arthritis, vitamin D deficiency, OSA, restless leg syndrome, who presents to the ED for RUQ pain that radiates to her back with an onset 11PM last night (15 hours ago). Patient was in her normal state of health until at onset when she awoke from sleep due to worsening lower back pain. Patient thought it was gas so she drank baking soda and water and ginger tea which did not alleviate her symptoms. Her back pain eventually radiated to her RUQ pain and worsened to 10/10 pain which did not improve at home. Her husband convinced her to come to the ED for evaluation. She reports she has had similar pain like this before which she attributed it to gas; that pain went away on its own and was less severe which is different in nature compared to current pain. She reports the current pain is not worsened with drinking water or ginger tea. Patient has not eaten since dinner last night. Reports her pain is alleviated by Dilaudid and morphine, and Toradol here in the ED; pain now currently 3/10. Patient still reports some nausea relieved by Zofran here in the ED. Reports non-bloody, non-bilious vomiting x 1 last night; reports it looked greasy like her fettuccine dinner last night. Reports diarrhea 2 days ago and watery stools earlier in the day yesterday. Denies dysuria. Denies fever at  home, chest pain, shortness of breath, headaches, vision changes. Patient does report dry mouth. Recently increased metformin dose from 500 mg bid to 1000 mg bid last month. Also recently started lisinopril 5 mg daily; already compliant on amlodipine 5 mg daily. No other recent medication changes. Denies any alcohol or tobacco use. No other medical complaints.  ED Course: Patient was afebrile 97.9, non-tachycardic 80, non-tachypneic 18, hypertensive 156/80, satting 100% on room air. Normal WBC 8.3. Transaminitis with AST 204, ALT 104, normal AP 90, normal total bili 1.0. Elevated Cr 1.07 but at baseline over the past 6 months. Lipase 19. UA unremarkable. Patient had limited abdominal U/S which showed cholelithiasis without sonographic evidence of acute cholecystitis, as well as a mildly dilated common bile duct measuring 99.8 mm of uncertain etiology without choledocholithiasis or obstructing mass identified. MRCP pending at time of evaluation. Patient was given dilaudid 1 mg x 4, morphine 4 mg x 2, toradol 15 mg x 1, Zofran 4 mg x 3, ativan 1 mg, NS 1L bolus x 2.   Meds: Current Facility-Administered Medications  Medication Dose Route Frequency Provider Last Rate Last Dose  . 0.9 %  sodium chloride infusion   Intravenous Continuous Zada Finders, MD      . amLODipine (NORVASC) tablet 5 mg  5 mg Oral Daily Zada Finders, MD      . atorvastatin (LIPITOR) tablet 40 mg  40 mg Oral Daily Zada Finders, MD      . enoxaparin (LOVENOX) injection 40 mg  40 mg Subcutaneous Q24H Zada Finders, MD      . HYDROmorphone (DILAUDID) injection 0.5 mg  0.5 mg Intravenous Q3H PRN Zada Finders, MD      . insulin aspart (novoLOG) injection 0-9 Units  0-9 Units Subcutaneous TID WC Zada Finders, MD      . ketorolac (TORADOL) 15 MG/ML injection 15 mg  15 mg Intravenous Q6H PRN Zada Finders, MD      . lisinopril (PRINIVIL,ZESTRIL) tablet 20 mg  20 mg Oral Daily Zada Finders, MD      . ondansetron (ZOFRAN) tablet 4 mg  4 mg Oral  Q6H PRN Zada Finders, MD       Or  . ondansetron (ZOFRAN) injection 4 mg  4 mg Intravenous Q6H PRN Zada Finders, MD      . senna-docusate (Senokot-S) tablet 1 tablet  1 tablet Oral QHS PRN Zada Finders, MD       Current Outpatient Prescriptions  Medication Sig Dispense Refill  . amLODipine (NORVASC) 5 MG tablet Take 1 tablet (5 mg total) by mouth daily. 30 tablet 11  . aspirin EC 81 MG tablet Take 1 tablet (81 mg total) by mouth daily. 30 tablet 11  . atorvastatin (LIPITOR) 40 MG tablet Take 1 tablet (40 mg total) by mouth daily. 30 tablet 11  . Cholecalciferol (VITAMIN D) 2000 units CAPS Take 1 capsule (2,000 Units total) by mouth daily. 30 capsule 0  . ferrous sulfate 325 (65 FE) MG tablet Take 1 tablet (325 mg total) by mouth daily with breakfast. 30 tablet 3  . fluticasone (FLONASE) 50 MCG/ACT nasal spray Place 1 spray into both nostrils daily. 16 g 0  . lisinopril (PRINIVIL,ZESTRIL) 5 MG tablet Take 4 tablets (20 mg total) by mouth daily. 30 tablet 2  . metFORMIN (GLUCOPHAGE) 500 MG tablet TAKE 2 TABLETS BY MOUTH TWICE DAILY WITH MEAL 180 tablet 0  . rOPINIRole (REQUIP) 0.5 MG tablet Take 1 tablet (0.5 mg total) by mouth at bedtime. 30 tablet 0    Allergies: Allergies as of 06/05/2016  . (No Known Allergies)   Past Medical History:  Diagnosis Date  . Arthritis   . Hypertension   . Restless leg syndrome   . Sleep apnea    Past Surgical History:  Procedure Laterality Date  . ROTATOR CUFF REPAIR  2007/2008   left   Family History  Problem Relation Age of Onset  . Allergies Father   . Alcohol abuse Father   . Tuberculosis Father     deceased from TB  . Allergies Other     sibling  . Asthma Other     sibling  . Hypertension Other     sibling  . Migraines Other     sibling  . Rashes / Skin problems Other     sibling  . Gout Other     sibling   Social History   Social History  . Marital status: Married    Spouse name: N/A  . Number of children: N/A  . Years of  education: N/A   Occupational History  . unemployeed    Social History Main Topics  . Smoking status: Never Smoker  . Smokeless tobacco: Never Used  . Alcohol use No  . Drug use: No  . Sexual  activity: Not on file   Other Topics Concern  . Not on file   Social History Narrative  . No narrative on file    Review of Systems: Pertinent items noted in HPI and remainder of comprehensive ROS otherwise negative.  Physical Exam: Blood pressure (!) 142/67, pulse 70, temperature 97.9 F (36.6 C), temperature source Oral, resp. rate 16, height 5\' 1"  (1.549 m), SpO2 92 %.  General: NAD on pain medication, conversational and pleasant HEENT: NCAT. PERRL with ROM intact. Dry MM. CV: RRR w/o m/r/g Pulm: CTAB w/o wheezing, rales, or rhonchi Abd: Moderate TTP RUQ. Negative Murphy's sign. Non-tender in all other quadrants. Normal bowel sounds. No rebound or guarding. No hepatosplenomegaly. No other palpable masses. Ext: No peripheral edema in all extremities. DP and PT pulses 2+ Neuro: A&O x 3 Skin: No skin lesions or rashes, no erythema or discoloration, no warmth to touch.  Lab results: Labs reviewed in EMR.  Imaging results:  Mr 3d Recon At Scanner  Result Date: 06/05/2016 CLINICAL DATA:  Right upper quadrant abdominal pain, vomiting, and diarrhea for several days. Cholelithiasis and biliary ductal dilatation seen on recent ultrasound. EXAM: MRI ABDOMEN WITHOUT AND WITH CONTRAST (INCLUDING MRCP) TECHNIQUE: Multiplanar multisequence MR imaging of the abdomen was performed both before and after the administration of intravenous contrast. Heavily T2-weighted images of the biliary and pancreatic ducts were obtained, and three-dimensional MRCP images were rendered by post processing. CONTRAST:  52mL MULTIHANCE GADOBENATE DIMEGLUMINE 529 MG/ML IV SOLN COMPARISON:  Ultrasound on 06/05/2016 FINDINGS: Lower chest: Bibasilar atelectasis noted. Hepatobiliary: No masses identified. Mild diffuse hepatic  steatosis. Gallbladder is mildly distended with several tiny gallstones in the gallbladder neck, however there is no evidence of significant gallbladder wall thickening or pericholecystic inflammatory changes. Mild diffuse biliary ductal dilatation is seen, with common bile duct measuring 12 mm. Smooth tapering of the distal common bile duct is seen, without evidence of choledocholithiasis. Pancreas: No mass or inflammatory changes. No evidence of pancreatic ductal dilatation or pancreas divisum. Spleen:  Within normal limits in size and appearance. Adrenals/Urinary Tract: No masses identified. No evidence of hydronephrosis. Stomach/Bowel: Small hiatal hernia.  Otherwise unremarkable. Vascular/Lymphatic: No pathologically enlarged lymph nodes identified. No abdominal aortic aneurysm. Other:  None. Musculoskeletal:  No suspicious bone lesions identified. IMPRESSION: Distended gallbladder with tiny gallstones. No other definite signs of acute cholecystitis. Mild diffuse biliary ductal dilatation, with common bile duct measuring 12 mm. No choledocholithiasis or other obstructing etiology visualized. Mild diffuse hepatic steatosis.  No liver mass identified. Small hiatal hernia. Bibasilar atelectasis. Electronically Signed   By: Earle Gell M.D.   On: 06/05/2016 16:35   Mr Abdomen Mrcp Moise Boring Contast  Result Date: 06/05/2016 CLINICAL DATA:  Right upper quadrant abdominal pain, vomiting, and diarrhea for several days. Cholelithiasis and biliary ductal dilatation seen on recent ultrasound. EXAM: MRI ABDOMEN WITHOUT AND WITH CONTRAST (INCLUDING MRCP) TECHNIQUE: Multiplanar multisequence MR imaging of the abdomen was performed both before and after the administration of intravenous contrast. Heavily T2-weighted images of the biliary and pancreatic ducts were obtained, and three-dimensional MRCP images were rendered by post processing. CONTRAST:  24mL MULTIHANCE GADOBENATE DIMEGLUMINE 529 MG/ML IV SOLN COMPARISON:   Ultrasound on 06/05/2016 FINDINGS: Lower chest: Bibasilar atelectasis noted. Hepatobiliary: No masses identified. Mild diffuse hepatic steatosis. Gallbladder is mildly distended with several tiny gallstones in the gallbladder neck, however there is no evidence of significant gallbladder wall thickening or pericholecystic inflammatory changes. Mild diffuse biliary ductal dilatation is seen, with common bile duct  measuring 12 mm. Smooth tapering of the distal common bile duct is seen, without evidence of choledocholithiasis. Pancreas: No mass or inflammatory changes. No evidence of pancreatic ductal dilatation or pancreas divisum. Spleen:  Within normal limits in size and appearance. Adrenals/Urinary Tract: No masses identified. No evidence of hydronephrosis. Stomach/Bowel: Small hiatal hernia.  Otherwise unremarkable. Vascular/Lymphatic: No pathologically enlarged lymph nodes identified. No abdominal aortic aneurysm. Other:  None. Musculoskeletal:  No suspicious bone lesions identified. IMPRESSION: Distended gallbladder with tiny gallstones. No other definite signs of acute cholecystitis. Mild diffuse biliary ductal dilatation, with common bile duct measuring 12 mm. No choledocholithiasis or other obstructing etiology visualized. Mild diffuse hepatic steatosis.  No liver mass identified. Small hiatal hernia. Bibasilar atelectasis. Electronically Signed   By: Earle Gell M.D.   On: 06/05/2016 16:35   US Abdomen Limited Ruq  Result Date: 06/05/2016 CLINICAL DATA:  Right upper quadrant pain. EXAM: US ABDOMEN LIMITED - RIGHT UPPER QUADRANT COMPARISON:  None. FINDINGS: Gallbladder: Cholelithiasis without gallbladder wall thickening or pericholecystic fluid. No sonographic Murphy sign noted by sonographer. Common bile duct: Diameter: 10.2 mm Liver: No focal lesion identified. Increased hepatic parenchymal echogenicity. IMPRESSION: 1. Cholelithiasis without sonographic evidence of acute cholecystitis. 2. Mildly dilated  common bile duct measuring 78.6 mm of uncertain etiology. No choledocholithiasis or obstructing mass identified. If there is further clinical concern, recommend MRI/MRCP. Electronically Signed   By: Kathreen Devoid   On: 06/05/2016 09:58    Assessment & Plan by Problem: Principal Problem:   Symptomatic cholelithiasis Active Problems:   HTN (hypertension)   T2DM (type 2 diabetes mellitus) ([place])  SKYLAH DELAUTER is a 61 y.o female with PMHx HTN, T2DM, morbid obesity, arthritis, vitamin D deficiency, OSA, restless leg syndrome, who presents with RUQ pain.  1. Cholelithiasis - Patient has worsening RUQ pain with radiation to the back without fever, jaundice, altered mental status, or hypotension. Normal WBC. Normal lipase. Transaminitis (AST/ALT) without elevated AP or total bilirubin. Limited abdominal U/S which showed cholelithiasis without sonographic evidence of acute cholecystitis, as well as a mildly dilated common bile duct measuring 76.7 mm of uncertain etiology without choledocholithiasis or obstructing mass identified. MRCP pending; depending on results we will do ERCP with GI vs. Cholecystectomy with General Surgery. For now, pain/nausea control, hydration, NPO. No indication for abx therapy. - MRCP results pending; will consult GI vs. Surgery pending results - NS 125 mL/hr - Pain control: Dilaudid 0.5 mg every 3 hours PRN, Toradol 15 mg every 6 hours PRN - Zofran prn - NPO - sips with meds - Trend CBC for WBC - Trend CMP for liver enzymes  2. Hypertension - elevated BP at presentation 156/80, likely due to pain. After pain medications, at goal 138/62. Cr at baseline 1.07. Will continue home medications and CTM BPs. - Continue home lisinopril 5 mg daily and amlodipine 5 mg  3. T2DM - A1C 6.4 in 04/2016 at goal. On home metformin 1000 mg bid; will d/c here in hospital and do SSI. - D/C metformin - SSI  4. OSA - We do not have records for CPAP titration as sleep study a few weeks ago  has not been read. Will hold off CPAP for now. No supplemental O2 at home.  DVT/PE ppx: Lovenox CODE: FULL FEN: NPO - sips with meds  This is a Careers information officer Note.  The care of the patient was discussed with Dr. Dareen Piano and the assessment and plan was formulated with their assistance.  Please see their note for  official documentation of the patient encounter.   Signed: Esaw Dace, Medical Student 06/05/2016, 5:15 PM   ADDENDUM 5:20PM: MRCP showed distended gallbladder with tiny gallstones without other definite signs nof acute cholecystitis; some mild diffuse biliary ductal dilatation, but choledocholithiasis. General surgery called who will see the patient.

## 2016-06-05 NOTE — Consult Note (Signed)
Surgical Consultation Requesting provider: Dr. Dareen Piano  CC: abdominal pain  HPI: 61yo woman with history of diabetes, HTN, obesity who presents with abdominal pain which woke her from sleep at 11pm last night. The pain is sharp and burning and located in the right flank, mid-back and upper abdomen. Some associated nausea and one episode of emesis. She recalls one prior similar episode which resolved on its own quickly.  She also notes some diarrhea in the last few days. Denies fevers or jaundice. No prior abdominal surgeries. SO far nothing has relieved the pain. She is no longer nauseaed and is requesting something to drink.   No Known Allergies  Past Medical History:  Diagnosis Date  . Arthritis   . Hypertension   . Restless leg syndrome   . Sleep apnea     Past Surgical History:  Procedure Laterality Date  . ROTATOR CUFF REPAIR  2007/2008   left    Family History  Problem Relation Age of Onset  . Allergies Father   . Alcohol abuse Father   . Tuberculosis Father     deceased from TB  . Allergies Other     sibling  . Asthma Other     sibling  . Hypertension Other     sibling  . Migraines Other     sibling  . Rashes / Skin problems Other     sibling  . Gout Other     sibling    Social History   Social History  . Marital status: Married    Spouse name: N/A  . Number of children: N/A  . Years of education: N/A   Occupational History  . unemployeed    Social History Main Topics  . Smoking status: Never Smoker  . Smokeless tobacco: Never Used  . Alcohol use No  . Drug use: No  . Sexual activity: Not Asked   Other Topics Concern  . None   Social History Narrative  . None    No current facility-administered medications on file prior to encounter.    Current Outpatient Prescriptions on File Prior to Encounter  Medication Sig Dispense Refill  . amLODipine (NORVASC) 5 MG tablet Take 1 tablet (5 mg total) by mouth daily. 30 tablet 11  . aspirin EC 81 MG  tablet Take 1 tablet (81 mg total) by mouth daily. 30 tablet 11  . atorvastatin (LIPITOR) 40 MG tablet Take 1 tablet (40 mg total) by mouth daily. 30 tablet 11  . Cholecalciferol (VITAMIN D) 2000 units CAPS Take 1 capsule (2,000 Units total) by mouth daily. 30 capsule 0  . ferrous sulfate 325 (65 FE) MG tablet Take 1 tablet (325 mg total) by mouth daily with breakfast. 30 tablet 3  . fluticasone (FLONASE) 50 MCG/ACT nasal spray Place 1 spray into both nostrils daily. 16 g 0  . lisinopril (PRINIVIL,ZESTRIL) 5 MG tablet Take 4 tablets (20 mg total) by mouth daily. 30 tablet 2  . metFORMIN (GLUCOPHAGE) 500 MG tablet TAKE 2 TABLETS BY MOUTH TWICE DAILY WITH MEAL 180 tablet 0  . rOPINIRole (REQUIP) 0.5 MG tablet Take 1 tablet (0.5 mg total) by mouth at bedtime. 30 tablet 0    Review of Systems: a complete, 10pt review of systems was completed with pertinent positives and negatives as documented in the HPI.   Physical Exam: Vitals:   06/05/16 1500 06/05/16 1716  BP: (!) 142/67 (!) 145/76  Pulse: 70 66  Resp:  18  Temp:     Gen: A&Ox3,  no distress  Head: normocephalic, atraumatic, EOMI, anicteric.  Neck: supple without mass or thyromegaly Chest: unlabored respirations, symmetrical air entry  Cardiovascular: RRR with palpable distal pulses, no pedal edema Abdomen: obese, no palpable mass or organomegaly. Focally tender in the right subcostal margin. No peritonitis.  Extremities: warm, without edema, no deformities  Neuro: grossly intact, moves all extremities equally Psych: appropriate mood and affect, normal insight Skin: no lesions or rashes on limited skin exam.    CBC Latest Ref Rng & Units 06/05/2016 11/17/2015 05/30/2009  WBC 4.0 - 10.5 K/uL 8.3 6.5 -  Hemoglobin 12.0 - 15.0 g/dL 11.3(L) - 12.2  Hematocrit 36.0 - 46.0 % 34.4(L) 34.5 36.0  Platelets 150 - 400 K/uL 289 283 -    CMP Latest Ref Rng & Units 06/05/2016 03/01/2016 11/17/2015  Glucose 65 - 99 mg/dL 150(H) 106(H) 100(H)  BUN  6 - 20 mg/dL 9 13 13   Creatinine 0.44 - 1.00 mg/dL 1.07(H) 1.23(H) 1.05(H)  Sodium 135 - 145 mmol/L 139 145(H) 144  Potassium 3.5 - 5.1 mmol/L 3.6 4.1 3.8  Chloride 101 - 111 mmol/L 102 105 104  CO2 22 - 32 mmol/L 25 23 25   Calcium 8.9 - 10.3 mg/dL 9.4 9.6 9.4  Total Protein 6.5 - 8.1 g/dL 7.3 - -  Total Bilirubin 0.3 - 1.2 mg/dL 1.0 - -  Alkaline Phos 38 - 126 U/L 90 - -  AST 15 - 41 U/L 204(H) - -  ALT 14 - 54 U/L 104(H) - -    No results found for: INR, PROTIME  Imaging: US ABDOMEN LIMITED - RIGHT UPPER QUADRANT  COMPARISON:  None.  FINDINGS: Gallbladder:  Cholelithiasis without gallbladder wall thickening or pericholecystic fluid. No sonographic Murphy sign noted by sonographer.  Common bile duct:  Diameter: 10.2 mm  Liver:  No focal lesion identified. Increased hepatic parenchymal echogenicity.  IMPRESSION: 1. Cholelithiasis without sonographic evidence of acute cholecystitis. 2. Mildly dilated common bile duct measuring 39.7 mm of uncertain etiology. No choledocholithiasis or obstructing mass identified. If there is further clinical concern, recommend MRI/MRCP.   MRI ABDOMEN WITHOUT AND WITH CONTRAST (INCLUDING MRCP)  TECHNIQUE: Multiplanar multisequence MR imaging of the abdomen was performed both before and after the administration of intravenous contrast. Heavily T2-weighted images of the biliary and pancreatic ducts were obtained, and three-dimensional MRCP images were rendered by post processing.  CONTRAST:  21mL MULTIHANCE GADOBENATE DIMEGLUMINE 529 MG/ML IV SOLN  COMPARISON:  Ultrasound on 06/05/2016  FINDINGS: Lower chest: Bibasilar atelectasis noted.  Hepatobiliary: No masses identified. Mild diffuse hepatic steatosis. Gallbladder is mildly distended with several tiny gallstones in the gallbladder neck, however there is no evidence of significant gallbladder wall thickening or pericholecystic inflammatory changes.  Mild  diffuse biliary ductal dilatation is seen, with common bile duct measuring 12 mm. Smooth tapering of the distal common bile duct is seen, without evidence of choledocholithiasis.  Pancreas: No mass or inflammatory changes. No evidence of pancreatic ductal dilatation or pancreas divisum.  Spleen:  Within normal limits in size and appearance.  Adrenals/Urinary Tract: No masses identified. No evidence of hydronephrosis.  Stomach/Bowel: Small hiatal hernia.  Otherwise unremarkable.  Vascular/Lymphatic: No pathologically enlarged lymph nodes identified. No abdominal aortic aneurysm.  Other:  None.  Musculoskeletal:  No suspicious bone lesions identified.  IMPRESSION: Distended gallbladder with tiny gallstones. No other definite signs of acute cholecystitis.  Mild diffuse biliary ductal dilatation, with common bile duct measuring 12 mm. No choledocholithiasis or other obstructing etiology visualized.  Mild diffuse hepatic  steatosis.  No liver mass identified.  Small hiatal hernia.  Bibasilar atelectasis.  A/P: 61yo woman with multiple comorbidities and intractable RUQ pain, suspected due to cholelithiasis. Plan for laparoscopic cholecystectomy with cholangiogram tomorrow with Dr. Grandville Silos, OR time permitting. We discussed the surgery, the risks of bleeding, infection, pain, scarring, conversion to open surgery, intraabdominal injury specifically CBD and sequelae, risks of general anesthesia including blood clots, heart attack, stroke, pneumonia; and small chance surgery will not resolve her symptoms. She asked appropriate questions which were answered.  -NPO midnight  Romana Juniper, MD [facility], [place] Pager [phone]

## 2016-06-05 NOTE — ED Provider Notes (Signed)
[facility] DEPT Provider Note   CSN: [id number] Arrival date & time: 06/05/16  9767     History   Chief Complaint Chief Complaint  Patient presents with  . Abdominal Pain    HPI Kristen Frost is a 61 y.o. female with history of hypertension, type 2 DM, sleep apnea who presents with right upper quadrant pain that began around 11 PM last evening. Patient reports her pain has been constant, and sharp since onset. Patient's pain is radiating to her right-sided flank and back and between her shoulder blades. Patient reports having pasta with Alfredo sauce yesterday for dinner around 7 PM. She has had nausea and one episode of vomiting. Patient reports she has had a few episodes of diarrhea days leading up, however she has not had a bowel movement since yesterday, which was normal. Patient had ginger tea and baking soda water prior to arrival without relief of her symptoms. Patient denies any fevers, chest pain, shortness of breath, urinary symptoms. Patient denies any history of gallbladder problems or abdominal surgeries.  HPI  Past Medical History:  Diagnosis Date  . Arthritis   . Hypertension   . Restless leg syndrome   . Sleep apnea     Patient Active Problem List   Diagnosis Date Noted  . Symptomatic cholelithiasis 06/05/2016  . BV (bacterial vaginosis) 05/19/2016  . Pap smear for cervical cancer screening 05/17/2016  . Sinus congestion 05/17/2016  . Vitamin D deficiency 03/09/2016  . Dyslipidemia 03/02/2016  . Morbid obesity ([place]) 03/01/2016  . T2DM (type 2 diabetes mellitus) ([place]) 03/01/2016  . HTN (hypertension) 11/17/2015  . Anemia 11/17/2015  . Bilateral thigh pain 11/17/2015  . OSA (obstructive sleep apnea) 12/04/2011  . RLS (restless legs syndrome) 12/04/2011    Past Surgical History:  Procedure Laterality Date  . ROTATOR CUFF REPAIR  2007/2008   left    OB History    No data available       Home Medications    Prior to Admission medications     Medication Sig Start Date End Date Taking? Authorizing Provider  amLODipine (NORVASC) 5 MG tablet Take 1 tablet (5 mg total) by mouth daily. 03/01/16 03/01/17 Yes Holley Raring, MD  aspirin EC 81 MG tablet Take 1 tablet (81 mg total) by mouth daily. 03/02/16 03/02/17 Yes Holley Raring, MD  atorvastatin (LIPITOR) 40 MG tablet Take 1 tablet (40 mg total) by mouth daily. 03/02/16 03/02/17 Yes Holley Raring, MD  Cholecalciferol (VITAMIN D) 2000 units CAPS Take 1 capsule (2,000 Units total) by mouth daily. 05/09/16  Yes Holley Raring, MD  ferrous sulfate 325 (65 FE) MG tablet Take 1 tablet (325 mg total) by mouth daily with breakfast. 03/09/16 03/09/17 Yes Holley Raring, MD  fluticasone (FLONASE) 50 MCG/ACT nasal spray Place 1 spray into both nostrils daily. 05/17/16 05/17/17 Yes Holley Raring, MD  lisinopril (PRINIVIL,ZESTRIL) 5 MG tablet Take 4 tablets (20 mg total) by mouth daily. 05/17/16 05/17/17 Yes Holley Raring, MD  metFORMIN (GLUCOPHAGE) 500 MG tablet TAKE 2 TABLETS BY MOUTH TWICE DAILY WITH MEAL 05/17/16  Yes Holley Raring, MD  rOPINIRole (REQUIP) 0.5 MG tablet Take 1 tablet (0.5 mg total) by mouth at bedtime. 03/01/16  Yes Holley Raring, MD    Family History Family History  Problem Relation Age of Onset  . Allergies Father   . Alcohol abuse Father   . Tuberculosis Father     deceased from TB  . Allergies Other     sibling  . Asthma Other  sibling  . Hypertension Other     sibling  . Migraines Other     sibling  . Rashes / Skin problems Other     sibling  . Gout Other     sibling    Social History Social History  Substance Use Topics  . Smoking status: Never Smoker  . Smokeless tobacco: Never Used  . Alcohol use No     Allergies   Patient has no known allergies.   Review of Systems Review of Systems  Constitutional: Negative for chills and fever.  HENT: Negative for facial swelling and sore throat.   Respiratory: Negative for shortness of breath.   Cardiovascular:  Negative for chest pain.  Gastrointestinal: Positive for abdominal pain, diarrhea, nausea and vomiting. Negative for blood in stool.  Genitourinary: Negative for dysuria.  Musculoskeletal: Negative for back pain.  Skin: Negative for rash and wound.  Neurological: Negative for headaches.  Psychiatric/Behavioral: The patient is not nervous/anxious.      Physical Exam Updated Vital Signs BP (!) 142/67   Pulse 70   Temp 97.9 F (36.6 C) (Oral)   Resp 16   Ht 5\' 1"  (1.549 m)   SpO2 92%   Physical Exam  Constitutional: She appears well-developed and well-nourished. No distress.  HENT:  Head: Normocephalic and atraumatic.  Mouth/Throat: Oropharynx is clear and moist. No oropharyngeal exudate.  Eyes: Conjunctivae are normal. Pupils are equal, round, and reactive to light. Right eye exhibits no discharge. Left eye exhibits no discharge. No scleral icterus.  Neck: Normal range of motion. Neck supple. No thyromegaly present.  Cardiovascular: Normal rate, regular rhythm, normal heart sounds and intact distal pulses.  Exam reveals no gallop and no friction rub.   No murmur heard. Pulmonary/Chest: Effort normal and breath sounds normal. No stridor. No respiratory distress. She has no wheezes. She has no rales.  Abdominal: Soft. Bowel sounds are normal. She exhibits no distension. There is tenderness in the right upper quadrant and epigastric area. There is positive Murphy's sign. There is no rebound, no guarding and no tenderness at McBurney's point.    Musculoskeletal: She exhibits no edema.  Lymphadenopathy:    She has no cervical adenopathy.  Neurological: She is alert. Coordination normal.  Skin: Skin is warm and dry. No rash noted. She is not diaphoretic. No pallor.  Psychiatric: She has a normal mood and affect.  Nursing note and vitals reviewed.    ED Treatments / Results  Labs (all labs ordered are listed, but only abnormal results are displayed) Labs Reviewed  COMPREHENSIVE  METABOLIC PANEL - Abnormal; Notable for the following:       Result Value   Glucose, Bld 150 (*)    Creatinine, Ser 1.07 (*)    AST 204 (*)    ALT 104 (*)    GFR calc non Af Amer 55 (*)    All other components within normal limits  CBC - Abnormal; Notable for the following:    Hemoglobin 11.3 (*)    HCT 34.4 (*)    MCV 77.7 (*)    MCH 25.5 (*)    RDW 16.6 (*)    All other components within normal limits  URINALYSIS, ROUTINE W REFLEX MICROSCOPIC - Abnormal; Notable for the following:    Hgb urine dipstick SMALL (*)    Bacteria, UA RARE (*)    Squamous Epithelial / LPF 0-5 (*)    All other components within normal limits  LIPASE, BLOOD  HIV ANTIBODY (ROUTINE TESTING)  HEPATITIS C ANTIBODY (REFLEX)    EKG  EKG Interpretation None       Radiology US Abdomen Limited Ruq  Result Date: 06/05/2016 CLINICAL DATA:  Right upper quadrant pain. EXAM: US ABDOMEN LIMITED - RIGHT UPPER QUADRANT COMPARISON:  None. FINDINGS: Gallbladder: Cholelithiasis without gallbladder wall thickening or pericholecystic fluid. No sonographic Murphy sign noted by sonographer. Common bile duct: Diameter: 10.2 mm Liver: No focal lesion identified. Increased hepatic parenchymal echogenicity. IMPRESSION: 1. Cholelithiasis without sonographic evidence of acute cholecystitis. 2. Mildly dilated common bile duct measuring 36.6 mm of uncertain etiology. No choledocholithiasis or obstructing mass identified. If there is further clinical concern, recommend MRI/MRCP. Electronically Signed   By: Kathreen Devoid   On: 06/05/2016 09:58    Procedures Procedures (including critical care time)  Medications Ordered in ED Medications  enoxaparin (LOVENOX) injection 40 mg (not administered)  ketorolac (TORADOL) 15 MG/ML injection 15 mg (not administered)  ondansetron (ZOFRAN) tablet 4 mg (not administered)    Or  ondansetron (ZOFRAN) injection 4 mg (not administered)  senna-docusate (Senokot-S) tablet 1 tablet (not  administered)  HYDROmorphone (DILAUDID) injection 0.5 mg (not administered)  0.9 %  sodium chloride infusion (not administered)  amLODipine (NORVASC) tablet 5 mg (not administered)  atorvastatin (LIPITOR) tablet 40 mg (not administered)  lisinopril (PRINIVIL,ZESTRIL) tablet 20 mg (not administered)  insulin aspart (novoLOG) injection 0-9 Units (not administered)  sodium chloride 0.9 % bolus 1,000 mL (0 mLs Intravenous Stopped 06/05/16 1450)  ondansetron (ZOFRAN) injection 4 mg (4 mg Intravenous Given 06/05/16 0908)  morphine 4 MG/ML injection 4 mg (4 mg Intravenous Given 06/05/16 0909)  HYDROmorphone (DILAUDID) injection 1 mg (1 mg Intravenous Given 06/05/16 1032)  HYDROmorphone (DILAUDID) injection 1 mg (1 mg Intravenous Given 06/05/16 1257)  LORazepam (ATIVAN) injection 1 mg (1 mg Intravenous Given 06/05/16 1501)  gadobenate dimeglumine (MULTIHANCE) injection 20 mL (20 mLs Intravenous Contrast Given 06/05/16 1622)     Initial Impression / Assessment and Plan / ED Course  I have reviewed the triage vital signs and the nursing notes.  Pertinent labs & imaging results that were available during my care of the patient were reviewed by me and considered in my medical decision making (see chart for details).     The CBC shows hGB11.3. CMP shows glucose 150, creatinine 1.07, AST 204, ALT 104. Lipase 19. UA shows small hematuria, rare bacteria. RUQ US shows cholelithiasis without sonographic evidence of acute cholecystitis; mildly evaluated, and bile duct measuring 29.4 mm of uncertain etiology; recommend MRI/MRCP if further clinical concern. MRCP pending after consultation with Azucena Freed, PA-C with [facility]. I consulted family medicine who will admit patient for further evaluation and follow up of MRCP. If MRCP shows choledocholithiasis, family medicine to consult GI; otherwise, consult general surgery. Patient's pain controlled with Dilaudid in the ED. Patient understands and agrees with plan.  Patient also evaluated by Dr. Venora Maples who guided the patient's management and agrees with plan.  Final Clinical Impressions(s) / ED Diagnoses   Final diagnoses:  RUQ pain  Common bile duct dilatation    New Prescriptions New Prescriptions   No medications on file     Frederica Kuster, Hershal Coria 06/05/16 [place], MD 06/06/16 1601

## 2016-06-05 NOTE — ED Notes (Signed)
Patient transported to MRI 

## 2016-06-05 NOTE — ED Notes (Signed)
Pt ambulated to restroom, pt had no complaints while ambulating.

## 2016-06-05 NOTE — Progress Notes (Signed)
Pt admitted to the unit at 1715. Pt mental status is A&O x 4. Pt oriented to room, staff, and call bell. Skin is intact except where otherwise charted. Full assessment charted in [facility]. Call bell within reach. Visitor guidelines reviewed w/ pt and/or family.

## 2016-06-05 NOTE — ED Notes (Signed)
Pt taken to US

## 2016-06-05 NOTE — H&P (Signed)
Date: 06/05/2016               Patient Name:  Kristen Frost MRN: [id number]  DOB: 19-Dec-1955 Age / Sex: 61 y.o., female   PCP: Holley Raring, MD         Medical Service: Internal Medicine Teaching Service         Attending Physician: Dr. Aldine Contes, MD    First Contact: Malen Gauze, MSIV Pager: [phone]  Second Contact: Dr. Zada Finders Pager: [phone]       After Hours (After 5p/  First Contact Pager: [phone]  weekends / holidays): Second Contact Pager: [phone]   Chief Complaint: Abdominal Pain  History of Present Illness: Kristen Frost is a 61 year old female with PMH of T2DM, HTN, suspected OSA, Restless Leg Syndrome, and Vitamin D deficiency who presents with abdominal pain. Patient states that she had episodes of occasional diarrhea two days ago which resolved. She woke from her sleep last night (4/16) around 11 pm with a right sided flank and right upper quadrant throbbing pain with a bloated sensation and 10/10 intensity without radiation to any other area. She felt nauseous and had one episode of "spitting up" the fettuccini she ate for dinner. She had a normal bowel movement prior to her pain onset, but no further BMs. She felt like her pain was due to retention of a large amount of gas, however her husband brought her to the ED as her pain did not resolve over the next few hours. She did feel like she needed to use the bathroom but was unable to pass stool. She reports one similar but milder experience in the past which she felt secondary to gas retention which resolved on its own. She says she recently increased the dose of her Metformin from 500 mg BID to 1000 mg BID. She denies any prior known history of gallstones. She denies any fevers, chills, diaphoresis, chest pain, dyspnea, jaundice, confusion, lightheadedness, dizziness, or dysuria.  In the ED, she was noted to be hypertensive to 156/80 with pulse 80, temp 97.9, satting 100% on room air. Labwork was  notable for serum creatinine 1.07, WBC 8.3, Hgb 11.3, AST/ALT 204/104, Lipase 19, and negative urinalysis. A RUQ U/S was obtained which showed cholelithiasis without evidence of cholecystitis. CBD was mildly dilated at 10.2 mm but without obvious choledocholithiasis or obstructive mass identified. She received 1 L NS bolus and Morphine and dilaudid for pain which improved to 2-3/10 on examination. GI were consulted by the ED provider with recommendations to obtain an MRCP. Patient has recently established with [facility] and IMTS were called for admission.  Meds:  Current Meds  Medication Sig  . amLODipine (NORVASC) 5 MG tablet Take 1 tablet (5 mg total) by mouth daily.  Marland Kitchen aspirin EC 81 MG tablet Take 1 tablet (81 mg total) by mouth daily.  Marland Kitchen atorvastatin (LIPITOR) 40 MG tablet Take 1 tablet (40 mg total) by mouth daily.  . Cholecalciferol (VITAMIN D) 2000 units CAPS Take 1 capsule (2,000 Units total) by mouth daily.  . ferrous sulfate 325 (65 FE) MG tablet Take 1 tablet (325 mg total) by mouth daily with breakfast.  . fluticasone (FLONASE) 50 MCG/ACT nasal spray Place 1 spray into both nostrils daily.  Marland Kitchen lisinopril (PRINIVIL,ZESTRIL) 5 MG tablet Take 4 tablets (20 mg total) by mouth daily.  . metFORMIN (GLUCOPHAGE) 500 MG tablet TAKE 2 TABLETS BY MOUTH TWICE DAILY WITH MEAL  . rOPINIRole (REQUIP) 0.5 MG  tablet Take 1 tablet (0.5 mg total) by mouth at bedtime.     Allergies: Allergies as of 06/05/2016  . (No Known Allergies)   Past Medical History:  Diagnosis Date  . Arthritis   . Hypertension   . Restless leg syndrome   . Sleep apnea     Family History:  Family History  Problem Relation Age of Onset  . Allergies Father   . Alcohol abuse Father   . Tuberculosis Father     deceased from TB  . Allergies Other     sibling  . Asthma Other     sibling  . Hypertension Other     sibling  . Migraines Other     sibling  . Rashes / Skin problems Other     sibling  . Gout Other      sibling     Social History:  Social History   Social History  . Marital status: Married    Spouse name: N/A  . Number of children: N/A  . Years of education: N/A   Occupational History  . unemployeed    Social History Main Topics  . Smoking status: Never Smoker  . Smokeless tobacco: Never Used  . Alcohol use No  . Drug use: No  . Sexual activity: Not Asked   Other Topics Concern  . None   Social History Narrative  . None     Review of Systems: A complete ROS was negative except as per HPI.   Physical Exam: Blood pressure (!) 142/67, pulse 70, temperature 97.9 F (36.6 C), temperature source Oral, resp. rate 16, height 5\' 1"  (1.549 m), SpO2 92 %. Physical Exam  Constitutional: She is oriented to person, place, and time. She appears well-developed and well-nourished.  Appears mildly uncomfortable  HENT:  Head: Normocephalic and atraumatic.  Eyes: No scleral icterus.  Cardiovascular: Normal rate, regular rhythm and intact distal pulses.   No murmur heard. Pulmonary/Chest: Effort normal. No respiratory distress. She has no wheezes.  Faint bibasilar crackles  Abdominal: Soft. She exhibits no distension and no mass. There is no rebound and no guarding.  Slight epigastric tenderness, mild right upper quadrant and right flank tenderness with negative CVA tenderness  Musculoskeletal: She exhibits no edema or tenderness.  Neurological: She is alert and oriented to person, place, and time.  Skin: Skin is warm. She is not diaphoretic.  Psychiatric: She has a normal mood and affect.     EKG: Not obtained  CXR: Not obtained  Assessment & Plan by Problem: Principal Problem:   Symptomatic cholelithiasis Active Problems:   HTN (hypertension)   T2DM (type 2 diabetes mellitus) ([place])  Kristen Frost is a 61 year old female with PMH of T2DM, HTN, suspected OSA, Restless Leg Syndrome, and Vitamin D deficiency who presents with abdominal pain.   Symptomatic  Cholelithiasis: Patient with RUQ pain and gallstones on U/S without evidence for cholecystitis. Mild CBD dilation raises concern for unobserved choledocholithiasis or obstruction. MRCP is ordered for further evaluation. If a stone is present, GI will continue to follow with possible ERCP. If no choledocholithiasis identified, then general surgery consultation will likely be needed for further evaluation and management. Patient is otherwise hemodynamically stable with fevers, jaundice, hypotension, or altered mental status to suggest ascending cholangitis. Will follow up on MRCP and proceed accordingly. - f/u MRCP - NPO - IV Dilaudid 0.5 mg q3h prn for severe pain - IV Toradol 15 mg q6h prn - NS @ 125 mL/hr for  16 hours - Zofran prn nausea - Monitor CBC, CMET  T2DM: Last Hgb A1c was 6.4 on 05/17/16. - SSI-S  HTN: - Resume home Amlodipine 5 mg daily and Lisinopril 20 mg daily   Dispo: Admit patient to Inpatient with expected length of stay greater than 2 midnights.  Signed: Zada Finders, MD 06/05/2016, 4:54 PM

## 2016-06-05 NOTE — ED Triage Notes (Signed)
Per PT, Pt is coming from home with complaints of RUQ pain that radiates from her back. Pt reports having diarrhea after she ate for the past three days and then having one episode of vomiting at 2300 last night. Reports continuous pain. Denies urinary symptoms.

## 2016-06-05 NOTE — ED Notes (Signed)
Pt wheeled back from waiting area. Assisted into gown and placed on bp and o2 monitor. Pt stated she was unable to give a urine sample at this time.

## 2016-06-06 ENCOUNTER — Telehealth: Payer: Self-pay | Admitting: Pulmonary Disease

## 2016-06-06 DIAGNOSIS — G4733 Obstructive sleep apnea (adult) (pediatric): Secondary | ICD-10-CM

## 2016-06-06 DIAGNOSIS — E785 Hyperlipidemia, unspecified: Secondary | ICD-10-CM

## 2016-06-06 LAB — COMPREHENSIVE METABOLIC PANEL
ALK PHOS: 104 U/L (ref 38–126)
ALT: 283 U/L — AB (ref 14–54)
AST: 391 U/L — ABNORMAL HIGH (ref 15–41)
Albumin: 3 g/dL — ABNORMAL LOW (ref 3.5–5.0)
Anion gap: 8 (ref 5–15)
BILIRUBIN TOTAL: 2.8 mg/dL — AB (ref 0.3–1.2)
BUN: 7 mg/dL (ref 6–20)
CALCIUM: 8.2 mg/dL — AB (ref 8.9–10.3)
CO2: 25 mmol/L (ref 22–32)
CREATININE: 1.03 mg/dL — AB (ref 0.44–1.00)
Chloride: 107 mmol/L (ref 101–111)
GFR, EST NON AFRICAN AMERICAN: 58 mL/min — AB (ref >60)
Glucose, Bld: 115 mg/dL — ABNORMAL HIGH (ref 65–99)
Potassium: 3.3 mmol/L — ABNORMAL LOW (ref 3.5–5.1)
Sodium: 140 mmol/L (ref 135–145)
Total Protein: 6.1 g/dL — ABNORMAL LOW (ref 6.5–8.1)

## 2016-06-06 LAB — GLUCOSE, CAPILLARY
GLUCOSE-CAPILLARY: 105 mg/dL — AB (ref 65–99)
GLUCOSE-CAPILLARY: 90 mg/dL (ref 65–99)
GLUCOSE-CAPILLARY: 99 mg/dL (ref 65–99)
Glucose-Capillary: 113 mg/dL — ABNORMAL HIGH (ref 65–99)

## 2016-06-06 LAB — CBC
HCT: 33.2 % — ABNORMAL LOW (ref 36.0–46.0)
Hemoglobin: 10.5 g/dL — ABNORMAL LOW (ref 12.0–15.0)
MCH: 24.5 pg — AB (ref 26.0–34.0)
MCHC: 31.6 g/dL (ref 30.0–36.0)
MCV: 77.4 fL — ABNORMAL LOW (ref 78.0–100.0)
PLATELETS: 238 10*3/uL (ref 150–400)
RBC: 4.29 MIL/uL (ref 3.87–5.11)
RDW: 16.5 % — ABNORMAL HIGH (ref 11.5–15.5)
WBC: 7.8 10*3/uL (ref 4.0–10.5)

## 2016-06-06 LAB — HEPATITIS C ANTIBODY (REFLEX): HCV Ab: <0.1 s/co ratio (ref 0.0–0.9)

## 2016-06-06 LAB — HIV ANTIBODY (ROUTINE TESTING W REFLEX): HIV Screen 4th Generation wRfx: NONREACTIVE

## 2016-06-06 LAB — SURGICAL PCR SCREEN
MRSA, PCR: NEGATIVE
Staphylococcus aureus: NEGATIVE

## 2016-06-06 LAB — HCV COMMENT:

## 2016-06-06 NOTE — Progress Notes (Signed)
[facility] Progress Note     Subjective: CC: abdominal pain  Pt is without pain at rest, mild pain with touch to her upper abdomen. No nausea, vomiting, fever or chills since admission.   Objective: Vital signs in last 24 hours: Temp:  [97.8 F (36.6 C)-98.7 F (37.1 C)] 98.7 F (37.1 C) (04/18 0636) Pulse Rate:  [66-83] 83 (04/18 0636) Resp:  [16-18] 18 (04/18 0636) BP: (128-156)/(58-80) 133/58 (04/18 0636) SpO2:  [92 %-100 %] 98 % (04/18 0636) Weight:  [256 lb 9.9 oz (116.4 kg)] 256 lb 9.9 oz (116.4 kg) (04/18 0629)    Intake/Output from previous day: 04/17 0701 - 04/18 0700 In: 2476.7 [P.O.:360; I.V.:1116.7; IV Piggyback:1000] Out: 1100 [Urine:1100] Intake/Output this shift: No intake/output data recorded.  PE: Gen:  Alert, NAD, pleasant, cooperative, well appearing Card:  RRR, no M/G/R heard Pulm:  CTA, no W/R/R, effort normal Abd: Soft, obese, not distended, +BS, mild TTP in RUQ and epigastric regions Skin: no rashes noted, warm and dry  Lab Results:   Recent Labs  06/05/16 0851 06/06/16 0516  WBC 8.3 7.8  HGB 11.3* 10.5*  HCT 34.4* 33.2*  PLT 289 238   BMET  Recent Labs  06/05/16 0851 06/06/16 0516  NA 139 140  K 3.6 3.3*  CL 102 107  CO2 25 25  GLUCOSE 150* 115*  BUN 9 7  CREATININE 1.07* 1.03*  CALCIUM 9.4 8.2*   PT/INR No results for input(s): LABPROT, INR in the last 72 hours. CMP     Component Value Date/Time   NA 140 06/06/2016 0516   NA 145 (H) 03/01/2016 1437   K 3.3 (L) 06/06/2016 0516   CL 107 06/06/2016 0516   CO2 25 06/06/2016 0516   GLUCOSE 115 (H) 06/06/2016 0516   BUN 7 06/06/2016 0516   BUN 13 03/01/2016 1437   CREATININE 1.03 (H) 06/06/2016 0516   CALCIUM 8.2 (L) 06/06/2016 0516   PROT 6.1 (L) 06/06/2016 0516   ALBUMIN 3.0 (L) 06/06/2016 0516   AST 391 (H) 06/06/2016 0516   ALT 283 (H) 06/06/2016 0516   ALKPHOS 104 06/06/2016 0516   BILITOT 2.8 (H) 06/06/2016 0516   GFRNONAA 58 (L) 06/06/2016 0516    GFRAA >60 06/06/2016 0516   Lipase     Component Value Date/Time   LIPASE 19 06/05/2016 0851       Studies/Results: Mr 3d Recon At Scanner  Result Date: 06/05/2016 CLINICAL DATA:  Right upper quadrant abdominal pain, vomiting, and diarrhea for several days. Cholelithiasis and biliary ductal dilatation seen on recent ultrasound. EXAM: MRI ABDOMEN WITHOUT AND WITH CONTRAST (INCLUDING MRCP) TECHNIQUE: Multiplanar multisequence MR imaging of the abdomen was performed both before and after the administration of intravenous contrast. Heavily T2-weighted images of the biliary and pancreatic ducts were obtained, and three-dimensional MRCP images were rendered by post processing. CONTRAST:  49mL MULTIHANCE GADOBENATE DIMEGLUMINE 529 MG/ML IV SOLN COMPARISON:  Ultrasound on 06/05/2016 FINDINGS: Lower chest: Bibasilar atelectasis noted. Hepatobiliary: No masses identified. Mild diffuse hepatic steatosis. Gallbladder is mildly distended with several tiny gallstones in the gallbladder neck, however there is no evidence of significant gallbladder wall thickening or pericholecystic inflammatory changes. Mild diffuse biliary ductal dilatation is seen, with common bile duct measuring 12 mm. Smooth tapering of the distal common bile duct is seen, without evidence of choledocholithiasis. Pancreas: No mass or inflammatory changes. No evidence of pancreatic ductal dilatation or pancreas divisum. Spleen:  Within normal limits in size and appearance. Adrenals/Urinary Tract: No  masses identified. No evidence of hydronephrosis. Stomach/Bowel: Small hiatal hernia.  Otherwise unremarkable. Vascular/Lymphatic: No pathologically enlarged lymph nodes identified. No abdominal aortic aneurysm. Other:  None. Musculoskeletal:  No suspicious bone lesions identified. IMPRESSION: Distended gallbladder with tiny gallstones. No other definite signs of acute cholecystitis. Mild diffuse biliary ductal dilatation, with common bile duct  measuring 12 mm. No choledocholithiasis or other obstructing etiology visualized. Mild diffuse hepatic steatosis.  No liver mass identified. Small hiatal hernia. Bibasilar atelectasis. Electronically Signed   By: Earle Gell M.D.   On: 06/05/2016 16:35   Mr Abdomen Mrcp Moise Boring Contast  Result Date: 06/05/2016 CLINICAL DATA:  Right upper quadrant abdominal pain, vomiting, and diarrhea for several days. Cholelithiasis and biliary ductal dilatation seen on recent ultrasound. EXAM: MRI ABDOMEN WITHOUT AND WITH CONTRAST (INCLUDING MRCP) TECHNIQUE: Multiplanar multisequence MR imaging of the abdomen was performed both before and after the administration of intravenous contrast. Heavily T2-weighted images of the biliary and pancreatic ducts were obtained, and three-dimensional MRCP images were rendered by post processing. CONTRAST:  24mL MULTIHANCE GADOBENATE DIMEGLUMINE 529 MG/ML IV SOLN COMPARISON:  Ultrasound on 06/05/2016 FINDINGS: Lower chest: Bibasilar atelectasis noted. Hepatobiliary: No masses identified. Mild diffuse hepatic steatosis. Gallbladder is mildly distended with several tiny gallstones in the gallbladder neck, however there is no evidence of significant gallbladder wall thickening or pericholecystic inflammatory changes. Mild diffuse biliary ductal dilatation is seen, with common bile duct measuring 12 mm. Smooth tapering of the distal common bile duct is seen, without evidence of choledocholithiasis. Pancreas: No mass or inflammatory changes. No evidence of pancreatic ductal dilatation or pancreas divisum. Spleen:  Within normal limits in size and appearance. Adrenals/Urinary Tract: No masses identified. No evidence of hydronephrosis. Stomach/Bowel: Small hiatal hernia.  Otherwise unremarkable. Vascular/Lymphatic: No pathologically enlarged lymph nodes identified. No abdominal aortic aneurysm. Other:  None. Musculoskeletal:  No suspicious bone lesions identified. IMPRESSION: Distended gallbladder with  tiny gallstones. No other definite signs of acute cholecystitis. Mild diffuse biliary ductal dilatation, with common bile duct measuring 12 mm. No choledocholithiasis or other obstructing etiology visualized. Mild diffuse hepatic steatosis.  No liver mass identified. Small hiatal hernia. Bibasilar atelectasis. Electronically Signed   By: Earle Gell M.D.   On: 06/05/2016 16:35   US Abdomen Limited Ruq  Result Date: 06/05/2016 CLINICAL DATA:  Right upper quadrant pain. EXAM: US ABDOMEN LIMITED - RIGHT UPPER QUADRANT COMPARISON:  None. FINDINGS: Gallbladder: Cholelithiasis without gallbladder wall thickening or pericholecystic fluid. No sonographic Murphy sign noted by sonographer. Common bile duct: Diameter: 10.2 mm Liver: No focal lesion identified. Increased hepatic parenchymal echogenicity. IMPRESSION: 1. Cholelithiasis without sonographic evidence of acute cholecystitis. 2. Mildly dilated common bile duct measuring 40.9 mm of uncertain etiology. No choledocholithiasis or obstructing mass identified. If there is further clinical concern, recommend MRI/MRCP. Electronically Signed   By: Kathreen Devoid   On: 06/05/2016 09:58    Anti-infectives: Anti-infectives    None       Assessment/Plan  DM HTN Obesity  Symptomatic Cholelithiasis - MRCP showed mild diffuse bilary ductal dilatation with CBD at 40mm. No choledocholithiasis. Distended gallbladder with tiny gallstones. No definite signs of cholecystitis. - Elevated LFT's (391 & 283) and Tbili (2.8) - No OR today, will likely take pt to OR tomorrow for lap chole - clears today and NPO at midnight.    LOS: 1 day    Kalman Drape , [facility] Surgery 06/06/2016, 7:57 AM Pager: [phone] Consults: [phone] Mon-Fri 7:00 am-4:30 pm Sat-Sun 7:00 am-11:30 am

## 2016-06-06 NOTE — Procedures (Signed)
   Patient Name: Kristen Frost, Hope Date: 04/25/2016 Gender: Female D.O.B: [birth year] Age (years): 59 Referring Provider: Chesley Mires MD, ABSM Height (inches): 62 Interpreting Physician: Chesley Mires MD, ABSM Weight (lbs): 263 RPSGT: Joni Reining BMI: 21 MRN: [id number] Neck Size: 15.00  CLINICAL INFORMATION Sleep Study Type: Split Night CPAP  Indication for sleep study: Excessive Daytime Sleepiness, Hypertension, OSA, Snoring  Epworth Sleepiness Score: 5  SLEEP STUDY TECHNIQUE As per the AASM Manual for the Scoring of Sleep and Associated Events v2.3 (April 2016) with a hypopnea requiring 4% desaturations.  The channels recorded and monitored were frontal, central and occipital EEG, electrooculogram (EOG), submentalis EMG (chin), nasal and oral airflow, thoracic and abdominal wall motion, anterior tibialis EMG, snore microphone, electrocardiogram, and pulse oximetry. Continuous positive airway pressure (CPAP) was initiated when the patient met split night criteria and was titrated according to treat sleep-disordered breathing.  MEDICATIONS Medications self-administered by patient taken the night of the study : N/A  RESPIRATORY PARAMETERS Diagnostic  Total AHI (/hr): 16.4 RDI (/hr): 19.8 OA Index (/hr): 1.5 CA Index (/hr): 0.0 REM AHI (/hr): N/A NREM AHI (/hr): 16.4 Supine AHI (/hr): 38.1 Non-supine AHI (/hr): 6.64 Min O2 Sat (%): 88.00 Mean O2 (%): 92.44 Time below 88% (min): 0.0   Titration  Optimal Pressure (cm): 9 AHI at Optimal Pressure (/hr): 0.0 Min O2 at Optimal Pressure (%): 92.0 Supine % at Optimal (%): 100 Sleep % at Optimal (%): 100    SLEEP ARCHITECTURE The recording time for the entire night was 431.6 minutes.  During a baseline period of 215.2 minutes, the patient slept for 157.3 minutes in REM and nonREM, yielding a sleep efficiency of 73.1%. Sleep onset after lights out was 26.4 minutes with a REM latency of N/A minutes. The patient spent 15.26% of  the night in stage N1 sleep, 82.20% in stage N2 sleep, 2.54% in stage N3 and 0.00% in REM.  During the titration period of 209.6 minutes, the patient slept for 88.2 minutes in REM and nonREM, yielding a sleep efficiency of 42.1%. Sleep onset after CPAP initiation was 118.9 minutes with a REM latency of 11.0 minutes. The patient spent 5.10% of the night in stage N1 sleep, 34.22% in stage N2 sleep, 0.00% in stage N3 and 60.68% in REM.  CARDIAC DATA The 2 lead EKG demonstrated sinus rhythm. The mean heart rate was N/A beats per minute. Other EKG findings include: None.  LEG MOVEMENT DATA The total Periodic Limb Movements of Sleep (PLMS) were 184. The PLMS index was 44.94 .  IMPRESSIONS - Moderate obstructive sleep apnea occurred during the diagnostic portion of the study(AHI = 16.4/hour). An optimal PAP pressure was selected for this patient ( 9 cm of water) - Mild oxygen desaturation was noted during the diagnostic portion of the study (Min O2 = 88.00%).  DIAGNOSIS - Obstructive Sleep Apnea (327.23 [G47.33 ICD-10])  RECOMMENDATIONS - Trial of CPAP therapy on 9 cm H2O - ?She was fitted with a Small size Resmed Nasal Pillow Mask AirFit P10 mask and heated humidification.  [Electronically signed] 06/06/2016 02:37 PM  Chesley Mires MD, [place], American Board of Sleep Medicine   NPI: [id number]

## 2016-06-06 NOTE — Telephone Encounter (Signed)
PSG 04/25/16 >> AHI 16.4, SpO2 low 88%.  CPAP 9 cm H2O.   Will have my nurse inform pt that sleep study shows moderate sleep apnea.  Options are 1) CPAP now, 2) ROV first.  If She is agreeable to CPAP, then please send order for auto CPAP range 5 to 15 cm H2O with heated humidity and mask of choice.  Have download sent 1 month after starting CPAP and set up ROV 2 months after starting CPAP.

## 2016-06-06 NOTE — Care Management Note (Addendum)
Case Management Note  Patient Details  Name: Kristen Frost MRN: [id number] Date of Birth: Aug 27, 1955  Subjective/Objective:   Symptomatic cholelithiasis, laparoscopic cholecystectomy 06/07/2016               Action/Plan: Discharge Planning: NCM spoke to pt and states she has sleep study last month and is waiting for her new CPAP machine. Will check oxygen sats prior to dc to make sure oxygen is not needed for home. Pt states she still drives to her appt. Husband at home to assist with care.   PCP Holley Raring MD   4/20 Addendum Carles Collet RN CM, patient weaned to RA.   Expected Discharge Date:                  Expected Discharge Plan:  Home/Self Care  In-House Referral:  NA  Discharge planning Services  CM Consult  Post Acute Care Choice:  NA Choice offered to:  NA  DME Arranged:  N/A DME Agency:  NA  HH Arranged:  NA HH Agency:  NA  Status of Service:  Completed, signed off  If discussed at [place] of Stay Meetings, dates discussed:    Additional Comments:  Erenest Rasher, RN 06/06/2016, 6:00 PM

## 2016-06-06 NOTE — Progress Notes (Signed)
   Subjective:  Patient feels improved compared to yesterday with less abdominal pain. Surgery plan for laparoscopic cholecystectomy tomorrow. She denies any fevers, chills, nausea, or vomiting. She reports passing gas this morning.  Objective:  Vital signs in last 24 hours: Vitals:   06/05/16 1716 06/05/16 2237 06/06/16 0629 06/06/16 0636  BP: (!) 145/76 128/64  (!) 133/58  Pulse: 66 76  83  Resp: 18 18  18   Temp:  97.8 F (36.6 C)  98.7 F (37.1 C)  TempSrc:      SpO2: 98% 94%  98%  Weight:   256 lb 9.9 oz (116.4 kg)   Height:   5' 1.5" (1.562 m)    General: resting in bed, no acute distress Cardiac: RRR, no rubs, murmurs or gallops Pulm: clear to auscultation bilaterally, moving normal volumes of air Abd: soft, mild tenderness right lateral abdomen, nondistended, hypoactive bowel sounds Ext: warm and well perfused, no pedal edema Neuro: alert and oriented X3   Assessment/Plan:  Principal Problem:   Symptomatic cholelithiasis Active Problems:   HTN (hypertension)   T2DM (type 2 diabetes mellitus) ([place])  [name] is a 61 year old female with PMH of T2DM, HTN, suspected OSA, Restless Leg Syndrome, and Vitamin D deficiency who presents with abdominal pain.  Symptomatic Cholelithiasis: Patient with RUQ pain and gallstones on U/S without evidence for cholecystitis. MRCP shows a mildly dilated CBD at 12 mm without evidence of choledocholithiasis, obstruction, or cholecystitis. Surgery is following and plan for laparoscopic cholecystectomy on 4/19.  - Clear diet today, NPO @ MN - Lap chole planned 4/19 - IV Dilaudid 0.5 mg q3h prn for severe pain - IV Toradol 15 mg q6h prn - Zofran prn nausea - Monitor CBC, CMET  T2DM: Last Hgb A1c was 6.4 on 05/17/16. - SSI-S  HTN: - Continue home Amlodipine 5 mg daily and Lisinopril 20 mg daily  HLD: - Continue home Atorvastatin 40 mg daily  Dispo: Anticipated discharge in approximately 2 day(s).   Zada Finders,  MD 06/06/2016, 10:21 AM

## 2016-06-06 NOTE — Progress Notes (Signed)
Subjective: [name]. Patient reports her RUQ pain is much improved with current pain regimen. Patient reports understanding of why cholecystectomy is needed. Denies n/v/d, chest pain, other abdominal pain, shortness of breath. Reports she is hungry.  Objective: Vital signs in last 24 hours: Vitals:   06/05/16 1716 06/05/16 2237 06/06/16 0629 06/06/16 0636  BP: (!) 145/76 128/64  (!) 133/58  Pulse: 66 76  83  Resp: 18 18  18   Temp:  97.8 F (36.6 C)  98.7 F (37.1 C)  TempSrc:      SpO2: 98% 94%  98%  Weight:   116.4 kg (256 lb 9.9 oz)   Height:   5' 1.5" (1.562 m)    Weight change:   Intake/Output Summary (Last 24 hours) at 06/06/16 1009 Last data filed at 06/06/16 0615  Gross per 24 hour  Intake          2476.67 ml  Output             1100 ml  Net          1376.67 ml   General: NAD on pain medication, conversational and pleasant HEENT: NCAT. PERRL with ROM intact. MMM. CV: RRR w/o m/r/g Pulm: CTAB w/o wheezing, rales, or rhonchi Abd: Mild TTP RUQ improved with pain meds. Negative Murphy's sign. Non-tender in all other quadrants. Normal bowel sounds. No rebound or guarding. No hepatosplenomegaly. No other palpable masses. Ext: No peripheral edema in all extremities. DP and PT pulses 2+ Neuro: A&O x 3. Normal strength and sensation in all extremities. Skin: No skin lesions or rashes, no erythema or discoloration, no warmth to touch.  Lab Results: Reviewed in EMR. Micro Results: Recent Results (from the past 240 hour(s))  Surgical pcr screen     Status: None   Collection Time: 06/05/16 10:39 PM  Result Value Ref Range Status   MRSA, PCR NEGATIVE NEGATIVE Final   Staphylococcus aureus NEGATIVE NEGATIVE Final    Comment:        The Xpert SA Assay (FDA approved for NASAL specimens in patients over 43 years of age), is one component of a comprehensive surveillance program.  Test performance has been validated by [facility] for patients greater than or equal to 72 year  old. It is not intended to diagnose infection nor to guide or monitor treatment.    Studies/Results: Mr 3d Recon At Scanner  Result Date: 06/05/2016 CLINICAL DATA:  Right upper quadrant abdominal pain, vomiting, and diarrhea for several days. Cholelithiasis and biliary ductal dilatation seen on recent ultrasound. EXAM: MRI ABDOMEN WITHOUT AND WITH CONTRAST (INCLUDING MRCP) TECHNIQUE: Multiplanar multisequence MR imaging of the abdomen was performed both before and after the administration of intravenous contrast. Heavily T2-weighted images of the biliary and pancreatic ducts were obtained, and three-dimensional MRCP images were rendered by post processing. CONTRAST:  59mL MULTIHANCE GADOBENATE DIMEGLUMINE 529 MG/ML IV SOLN COMPARISON:  Ultrasound on 06/05/2016 FINDINGS: Lower chest: Bibasilar atelectasis noted. Hepatobiliary: No masses identified. Mild diffuse hepatic steatosis. Gallbladder is mildly distended with several tiny gallstones in the gallbladder neck, however there is no evidence of significant gallbladder wall thickening or pericholecystic inflammatory changes. Mild diffuse biliary ductal dilatation is seen, with common bile duct measuring 12 mm. Smooth tapering of the distal common bile duct is seen, without evidence of choledocholithiasis. Pancreas: No mass or inflammatory changes. No evidence of pancreatic ductal dilatation or pancreas divisum. Spleen:  Within normal limits in size and appearance. Adrenals/Urinary Tract: No masses identified. No evidence of hydronephrosis.  Stomach/Bowel: Small hiatal hernia.  Otherwise unremarkable. Vascular/Lymphatic: No pathologically enlarged lymph nodes identified. No abdominal aortic aneurysm. Other:  None. Musculoskeletal:  No suspicious bone lesions identified. IMPRESSION: Distended gallbladder with tiny gallstones. No other definite signs of acute cholecystitis. Mild diffuse biliary ductal dilatation, with common bile duct measuring 12 mm. No  choledocholithiasis or other obstructing etiology visualized. Mild diffuse hepatic steatosis.  No liver mass identified. Small hiatal hernia. Bibasilar atelectasis. Electronically Signed   By: Earle Gell M.D.   On: 06/05/2016 16:35   Mr Abdomen Mrcp Moise Boring Contast  Result Date: 06/05/2016 CLINICAL DATA:  Right upper quadrant abdominal pain, vomiting, and diarrhea for several days. Cholelithiasis and biliary ductal dilatation seen on recent ultrasound. EXAM: MRI ABDOMEN WITHOUT AND WITH CONTRAST (INCLUDING MRCP) TECHNIQUE: Multiplanar multisequence MR imaging of the abdomen was performed both before and after the administration of intravenous contrast. Heavily T2-weighted images of the biliary and pancreatic ducts were obtained, and three-dimensional MRCP images were rendered by post processing. CONTRAST:  73mL MULTIHANCE GADOBENATE DIMEGLUMINE 529 MG/ML IV SOLN COMPARISON:  Ultrasound on 06/05/2016 FINDINGS: Lower chest: Bibasilar atelectasis noted. Hepatobiliary: No masses identified. Mild diffuse hepatic steatosis. Gallbladder is mildly distended with several tiny gallstones in the gallbladder neck, however there is no evidence of significant gallbladder wall thickening or pericholecystic inflammatory changes. Mild diffuse biliary ductal dilatation is seen, with common bile duct measuring 12 mm. Smooth tapering of the distal common bile duct is seen, without evidence of choledocholithiasis. Pancreas: No mass or inflammatory changes. No evidence of pancreatic ductal dilatation or pancreas divisum. Spleen:  Within normal limits in size and appearance. Adrenals/Urinary Tract: No masses identified. No evidence of hydronephrosis. Stomach/Bowel: Small hiatal hernia.  Otherwise unremarkable. Vascular/Lymphatic: No pathologically enlarged lymph nodes identified. No abdominal aortic aneurysm. Other:  None. Musculoskeletal:  No suspicious bone lesions identified. IMPRESSION: Distended gallbladder with tiny gallstones. No  other definite signs of acute cholecystitis. Mild diffuse biliary ductal dilatation, with common bile duct measuring 12 mm. No choledocholithiasis or other obstructing etiology visualized. Mild diffuse hepatic steatosis.  No liver mass identified. Small hiatal hernia. Bibasilar atelectasis. Electronically Signed   By: Earle Gell M.D.   On: 06/05/2016 16:35   US Abdomen Limited Ruq  Result Date: 06/05/2016 CLINICAL DATA:  Right upper quadrant pain. EXAM: US ABDOMEN LIMITED - RIGHT UPPER QUADRANT COMPARISON:  None. FINDINGS: Gallbladder: Cholelithiasis without gallbladder wall thickening or pericholecystic fluid. No sonographic Murphy sign noted by sonographer. Common bile duct: Diameter: 10.2 mm Liver: No focal lesion identified. Increased hepatic parenchymal echogenicity. IMPRESSION: 1. Cholelithiasis without sonographic evidence of acute cholecystitis. 2. Mildly dilated common bile duct measuring 54.0 mm of uncertain etiology. No choledocholithiasis or obstructing mass identified. If there is further clinical concern, recommend MRI/MRCP. Electronically Signed   By: Kathreen Devoid   On: 06/05/2016 09:58   Medications: I have reviewed the patient's current medications. Scheduled Meds: . amLODipine  5 mg Oral Daily  . atorvastatin  40 mg Oral Daily  . enoxaparin (LOVENOX) injection  40 mg Subcutaneous Q24H  . insulin aspart  0-9 Units Subcutaneous TID WC  . lisinopril  20 mg Oral Daily   Continuous Infusions: PRN Meds:.HYDROmorphone (DILAUDID) injection, ketorolac, ondansetron **OR** ondansetron (ZOFRAN) IV, senna-docusate Assessment/Plan: Principal Problem:   Symptomatic cholelithiasis Active Problems:   HTN (hypertension)   T2DM (type 2 diabetes mellitus) ([place])  Kristen Frost is a 61 y.o female with PMHx HTN, T2DM, morbid obesity, arthritis, vitamin D deficiency, OSA, restless leg syndrome, who  presents with RUQ pain.  1. Cholelithiasis - Patient has worsening RUQ pain with radiation to  the back without fever, jaundice, altered mental status, or hypotension. Normal WBC. Normal lipase. Transaminitis (AST/ALT) that has worsened this monring without elevated AP, now with elevated total bilirubin. Limited abdominal U/S which showed cholelithiasis without sonographic evidence of acute cholecystitis, as well as a mildly dilated common bile duct measuring 25.6 mm of uncertain etiology without choledocholithiasis or obstructing mass identified. MRCP showed distended gallbladder with tiny gallstones without other definite signs of acute cholecystitis; some mild diffuse biliary ductal dilatation, but not choledocholithiasis. General surgery on board to do cholecystectomy with cholangiogram which will happen tomorrow 4/19. Patient on clear liquid diet for today with NPO at midnight. For now, pain/nausea control and oral hydration. No indication for abx therapy. - Cholecystectomy with cholangiogram, tomorrow 4/19 - D/C IVFs; oral fluids until midnight - Pain control: Dilaudid 0.5 mg every 3 hours PRN, Toradol 15 mg every 6 hours PRN - Zofran prn - Liquid diet until midnight then NPO - sips with meds - Trend CBC for WBC - Trend CMP for liver enzymes  2. Hypertension - elevated BP at presentation 156/80, likely due to pain. After pain medications, at goal 138/62. Cr at baseline 1.07. Will continue home medications and CTM BPs. - Continue home lisinopril 5 mg daily and amlodipine 5 mg  3. T2DM - A1C 6.4 in 04/2016 at goal. On home metformin 1000 mg bid; will d/c here in hospital and do SSI. - D/C metformin - SSI  4. OSA - We do not have records for CPAP titration as sleep study a few weeks ago has not been read. Will hold off CPAP for now. No supplemental O2 at home.  DVT/PE ppx: Lovenox CODE: FULL FEN: NPO - sips with meds   This is a Careers information officer Note.  The care of the patient was discussed with Dr. Dareen Piano and the assessment and plan formulated with their assistance.  Please see  their attached note for official documentation of the daily encounter.   LOS: 1 day   Esaw Dace, Medical Student 06/06/2016, 10:09 AM

## 2016-06-07 ENCOUNTER — Encounter (HOSPITAL_COMMUNITY)
Admission: EM | Disposition: A | Discharge status: Home / Self Care | Payer: Self-pay | Source: Home / Self Care | Attending: Internal Medicine

## 2016-06-07 ENCOUNTER — Inpatient Hospital Stay (HOSPITAL_COMMUNITY): Payer: Medicaid Other | Admitting: Anesthesiology

## 2016-06-07 ENCOUNTER — Encounter (HOSPITAL_COMMUNITY): Payer: Self-pay | Admitting: Certified Registered Nurse Anesthetist

## 2016-06-07 DIAGNOSIS — E876 Hypokalemia: Secondary | ICD-10-CM

## 2016-06-07 HISTORY — PX: CHOLECYSTECTOMY: SHX55

## 2016-06-07 LAB — COMPREHENSIVE METABOLIC PANEL
ALBUMIN: 2.8 g/dL — AB (ref 3.5–5.0)
ALT: 254 U/L — ABNORMAL HIGH (ref 14–54)
ANION GAP: 9 (ref 5–15)
AST: 249 U/L — ABNORMAL HIGH (ref 15–41)
Alkaline Phosphatase: 142 U/L — ABNORMAL HIGH (ref 38–126)
BUN: 5 mg/dL — ABNORMAL LOW (ref 6–20)
CALCIUM: 8.7 mg/dL — AB (ref 8.9–10.3)
CO2: 27 mmol/L (ref 22–32)
Chloride: 108 mmol/L (ref 101–111)
Creatinine, Ser: 1.08 mg/dL — ABNORMAL HIGH (ref 0.44–1.00)
GFR calc Af Amer: >60 mL/min (ref >60)
GFR calc non Af Amer: 55 mL/min — ABNORMAL LOW (ref >60)
GLUCOSE: 101 mg/dL — AB (ref 65–99)
POTASSIUM: 2.9 mmol/L — AB (ref 3.5–5.1)
SODIUM: 144 mmol/L (ref 135–145)
Total Bilirubin: 2.5 mg/dL — ABNORMAL HIGH (ref 0.3–1.2)
Total Protein: 6.3 g/dL — ABNORMAL LOW (ref 6.5–8.1)

## 2016-06-07 LAB — POCT I-STAT EG7
ACID-BASE EXCESS: 3 mmol/L — AB (ref 0.0–2.0)
Bicarbonate: 28.3 mmol/L — ABNORMAL HIGH (ref 20.0–28.0)
CALCIUM ION: 1.22 mmol/L (ref 1.15–1.40)
HEMATOCRIT: 31 % — AB (ref 36.0–46.0)
Hemoglobin: 10.5 g/dL — ABNORMAL LOW (ref 12.0–15.0)
O2 SAT: 43 %
PH VEN: 7.421 (ref 7.250–7.430)
POTASSIUM: 3.4 mmol/L — AB (ref 3.5–5.1)
SODIUM: 148 mmol/L — AB (ref 135–145)
TCO2: 30 mmol/L (ref 0–100)
pCO2, Ven: 43.5 mmHg — ABNORMAL LOW (ref 44.0–60.0)
pO2, Ven: 24 mmHg — CL (ref 32.0–45.0)

## 2016-06-07 LAB — GLUCOSE, CAPILLARY
GLUCOSE-CAPILLARY: 99 mg/dL (ref 65–99)
GLUCOSE-CAPILLARY: 77 mg/dL (ref 65–99)
GLUCOSE-CAPILLARY: 165 mg/dL — AB (ref 65–99)
Glucose-Capillary: 88 mg/dL (ref 65–99)
Glucose-Capillary: 111 mg/dL — ABNORMAL HIGH (ref 65–99)

## 2016-06-07 LAB — SURGICAL PCR SCREEN
MRSA, PCR: NEGATIVE
Staphylococcus aureus: NEGATIVE

## 2016-06-07 SURGERY — LAPAROSCOPIC CHOLECYSTECTOMY WITH INTRAOPERATIVE CHOLANGIOGRAM
Anesthesia: General | Site: Abdomen

## 2016-06-07 MED ORDER — LACTATED RINGERS IV SOLN
Freq: Once | INTRAVENOUS | Status: AC
  Administered 2016-06-07: 11:00:00 via INTRAVENOUS

## 2016-06-07 MED ORDER — SUGAMMADEX SODIUM 200 MG/2ML IV SOLN
INTRAVENOUS | Status: DC | PRN
  Administered 2016-06-07: 200 mg via INTRAVENOUS

## 2016-06-07 MED ORDER — 0.9 % SODIUM CHLORIDE (POUR BTL) OPTIME
TOPICAL | Status: DC | PRN
  Administered 2016-06-07: 1000 mL

## 2016-06-07 MED ORDER — ONDANSETRON HCL 4 MG/2ML IJ SOLN
INTRAMUSCULAR | Status: AC
  Filled 2016-06-07: qty 2

## 2016-06-07 MED ORDER — FENTANYL CITRATE (PF) 250 MCG/5ML IJ SOLN
INTRAMUSCULAR | Status: AC
  Filled 2016-06-07: qty 5

## 2016-06-07 MED ORDER — HYDROMORPHONE HCL 1 MG/ML IJ SOLN: 0.2500 mg | INTRAMUSCULAR | Status: DC | PRN

## 2016-06-07 MED ORDER — CEFAZOLIN SODIUM-DEXTROSE 2-4 GM/100ML-% IV SOLN
INTRAVENOUS | Status: AC
  Filled 2016-06-07: qty 100

## 2016-06-07 MED ORDER — OXYCODONE HCL 5 MG PO TABS
5.0000 mg | ORAL_TABLET | ORAL | Status: DC | PRN
  Administered 2016-06-08 – 2016-06-09 (×4): 10 mg via ORAL
  Filled 2016-06-07 (×4): qty 2

## 2016-06-07 MED ORDER — HEMOSTATIC AGENTS (NO CHARGE) OPTIME
TOPICAL | Status: DC | PRN
  Administered 2016-06-07: 1 via TOPICAL

## 2016-06-07 MED ORDER — BUPIVACAINE HCL (PF) 0.25 % IJ SOLN
INTRAMUSCULAR | Status: DC | PRN
  Administered 2016-06-07: 21 mL

## 2016-06-07 MED ORDER — ONDANSETRON HCL 4 MG/2ML IJ SOLN
INTRAMUSCULAR | Status: DC | PRN
  Administered 2016-06-07: 4 mg via INTRAVENOUS

## 2016-06-07 MED ORDER — PHENYLEPHRINE HCL 10 MG/ML IJ SOLN
INTRAVENOUS | Status: DC | PRN
  Administered 2016-06-07: 40 ug/min via INTRAVENOUS

## 2016-06-07 MED ORDER — CEFAZOLIN SODIUM-DEXTROSE 2-3 GM-% IV SOLR
INTRAVENOUS | Status: DC | PRN
  Administered 2016-06-07: 2 g via INTRAVENOUS

## 2016-06-07 MED ORDER — PROPOFOL 10 MG/ML IV BOLUS
INTRAVENOUS | Status: AC
  Filled 2016-06-07: qty 20

## 2016-06-07 MED ORDER — DEXAMETHASONE SODIUM PHOSPHATE 10 MG/ML IJ SOLN
INTRAMUSCULAR | Status: DC | PRN
  Administered 2016-06-07: 10 mg via INTRAVENOUS

## 2016-06-07 MED ORDER — SODIUM CHLORIDE 0.9 % IV SOLN
INTRAVENOUS | Status: DC | PRN
  Administered 2016-06-07: 100 mL

## 2016-06-07 MED ORDER — SODIUM CHLORIDE 0.9 % IR SOLN
Status: DC | PRN
  Administered 2016-06-07: 1000 mL

## 2016-06-07 MED ORDER — LIDOCAINE HCL (CARDIAC) 20 MG/ML IV SOLN
INTRAVENOUS | Status: DC | PRN
  Administered 2016-06-07: 60 mg via INTRAVENOUS

## 2016-06-07 MED ORDER — LOSARTAN POTASSIUM 50 MG PO TABS
25.0000 mg | ORAL_TABLET | Freq: Every day | ORAL | Status: DC
  Administered 2016-06-08 – 2016-06-09 (×2): 25 mg via ORAL
  Filled 2016-06-07 (×2): qty 1

## 2016-06-07 MED ORDER — FENTANYL CITRATE (PF) 100 MCG/2ML IJ SOLN
INTRAMUSCULAR | Status: DC | PRN
  Administered 2016-06-07: 50 ug via INTRAVENOUS
  Administered 2016-06-07: 100 ug via INTRAVENOUS

## 2016-06-07 MED ORDER — MORPHINE SULFATE (PF) 2 MG/ML IV SOLN: 2.0000 mg | INTRAVENOUS | Status: DC | PRN

## 2016-06-07 MED ORDER — IOPAMIDOL (ISOVUE-300) INJECTION 61%
INTRAVENOUS | Status: AC
  Filled 2016-06-07: qty 50

## 2016-06-07 MED ORDER — ROCURONIUM BROMIDE 100 MG/10ML IV SOLN
INTRAVENOUS | Status: DC | PRN
  Administered 2016-06-07: 50 mg via INTRAVENOUS
  Administered 2016-06-07: 10 mg via INTRAVENOUS

## 2016-06-07 MED ORDER — BUPIVACAINE HCL (PF) 0.25 % IJ SOLN
INTRAMUSCULAR | Status: AC
  Filled 2016-06-07: qty 30

## 2016-06-07 MED ORDER — MIDAZOLAM HCL 5 MG/5ML IJ SOLN
INTRAMUSCULAR | Status: DC | PRN
  Administered 2016-06-07: 2 mg via INTRAVENOUS

## 2016-06-07 MED ORDER — MIDAZOLAM HCL 2 MG/2ML IJ SOLN
INTRAMUSCULAR | Status: AC
  Filled 2016-06-07: qty 2

## 2016-06-07 MED ORDER — LACTATED RINGERS IV SOLN
INTRAVENOUS | Status: DC | PRN
Start: 2016-06-07 — End: 2016-06-07
  Administered 2016-06-07 (×2): via INTRAVENOUS

## 2016-06-07 MED ORDER — SODIUM CHLORIDE 0.9 % IV SOLN
30.0000 meq | Freq: Once | INTRAVENOUS | Status: AC
  Administered 2016-06-07: 30 meq via INTRAVENOUS
  Filled 2016-06-07: qty 15

## 2016-06-07 MED ORDER — PROPOFOL 10 MG/ML IV BOLUS
INTRAVENOUS | Status: DC | PRN
  Administered 2016-06-07: 140 mg via INTRAVENOUS

## 2016-06-07 SURGICAL SUPPLY — 49 items
APPLIER CLIP 5 13 M/L LIGAMAX5 (MISCELLANEOUS) ×6
BLADE CLIPPER SURG (BLADE) IMPLANT
CANISTER SUCT 3000ML PPV (MISCELLANEOUS) ×3 IMPLANT
CATH REDDICK CHOLANGI 4FR 50CM (CATHETERS) ×3 IMPLANT
CHLORAPREP W/TINT 26ML (MISCELLANEOUS) ×3 IMPLANT
CLIP APPLIE 5 13 M/L LIGAMAX5 (MISCELLANEOUS) ×2 IMPLANT
COVER MAYO STAND STRL (DRAPES) ×3 IMPLANT
COVER SURGICAL LIGHT HANDLE (MISCELLANEOUS) ×3 IMPLANT
DERMABOND ADVANCED (GAUZE/BANDAGES/DRESSINGS) ×2
DERMABOND ADVANCED .7 DNX12 (GAUZE/BANDAGES/DRESSINGS) ×1 IMPLANT
DRAPE C-ARM 42X72 X-RAY (DRAPES) ×3 IMPLANT
ELECT REM PT RETURN 9FT ADLT (ELECTROSURGICAL) ×3
ELECTRODE REM PT RTRN 9FT ADLT (ELECTROSURGICAL) ×1 IMPLANT
FILTER SMOKE EVAC LAPAROSHD (FILTER) ×3 IMPLANT
GLOVE BIO SURGEON STRL SZ8 (GLOVE) ×3 IMPLANT
GLOVE BIOGEL PI IND STRL 8 (GLOVE) ×1 IMPLANT
GLOVE BIOGEL PI INDICATOR 8 (GLOVE) ×2
GOWN STRL REUS W/ TWL LRG LVL3 (GOWN DISPOSABLE) ×2 IMPLANT
GOWN STRL REUS W/ TWL XL LVL3 (GOWN DISPOSABLE) ×1 IMPLANT
GOWN STRL REUS W/TWL LRG LVL3 (GOWN DISPOSABLE) ×4
GOWN STRL REUS W/TWL XL LVL3 (GOWN DISPOSABLE) ×2
GRASPER SUT TROCAR 14GX15 (MISCELLANEOUS) ×3 IMPLANT
HEMOSTAT SNOW SURGICEL 2X4 (HEMOSTASIS) ×3 IMPLANT
IV CATH 14GX2 1/4 (CATHETERS) ×3 IMPLANT
KIT BASIN OR (CUSTOM PROCEDURE TRAY) ×3 IMPLANT
KIT ROOM TURNOVER OR (KITS) ×3 IMPLANT
L-HOOK LAP DISP 36CM (ELECTROSURGICAL) ×3
LHOOK LAP DISP 36CM (ELECTROSURGICAL) ×1 IMPLANT
NEEDLE 22X1 1/2 (OR ONLY) (NEEDLE) ×3 IMPLANT
NS IRRIG 1000ML POUR BTL (IV SOLUTION) ×3 IMPLANT
PAD ARMBOARD 7.5X6 YLW CONV (MISCELLANEOUS) ×3 IMPLANT
PENCIL BUTTON HOLSTER BLD 10FT (ELECTRODE) ×3 IMPLANT
POUCH RETRIEVAL ECOSAC 10 (ENDOMECHANICALS) ×1 IMPLANT
POUCH RETRIEVAL ECOSAC 10MM (ENDOMECHANICALS) ×2
SCISSORS LAP 5X35 DISP (ENDOMECHANICALS) ×3 IMPLANT
SET CHOLANGIOGRAPH 5 50 .035 (SET/KITS/TRAYS/PACK) ×3 IMPLANT
SET IRRIG TUBING LAPAROSCOPIC (IRRIGATION / IRRIGATOR) ×3 IMPLANT
SLEEVE ENDOPATH XCEL 5M (ENDOMECHANICALS) ×6 IMPLANT
SPECIMEN JAR SMALL (MISCELLANEOUS) ×3 IMPLANT
SUT VIC AB 4-0 PS2 27 (SUTURE) ×3 IMPLANT
SUT VICRYL 0 UR6 27IN ABS (SUTURE) ×6 IMPLANT
SUT VICRYL 4-0 PS2 18IN ABS (SUTURE) ×3 IMPLANT
TOWEL OR 17X24 6PK STRL BLUE (TOWEL DISPOSABLE) ×3 IMPLANT
TOWEL OR 17X26 10 PK STRL BLUE (TOWEL DISPOSABLE) IMPLANT
TRAY LAPAROSCOPIC MC (CUSTOM PROCEDURE TRAY) ×3 IMPLANT
TROCAR BLADELESS 11MM (ENDOMECHANICALS) ×3 IMPLANT
TROCAR XCEL BLUNT TIP 100MML (ENDOMECHANICALS) ×3 IMPLANT
TROCAR XCEL NON-BLD 5MMX100MML (ENDOMECHANICALS) ×3 IMPLANT
TUBING INSUFFLATION (TUBING) ×3 IMPLANT

## 2016-06-07 NOTE — Progress Notes (Signed)
Subjective: CC no pain  Objective: Vital signs in last 24 hours: Temp:  [98.6 F (37 C)-99.1 F (37.3 C)] 98.6 F (37 C) (04/19 0545) Pulse Rate:  [64-88] 64 (04/19 0545) Resp:  [18] 18 (04/19 0545) BP: (113-127)/(46-63) 113/49 (04/19 0545) SpO2:  [95 %-100 %] 95 % (04/19 0545) Weight:  [116.7 kg (257 lb 4.4 oz)] 116.7 kg (257 lb 4.4 oz) (04/19 0500) Last BM Date: 06/04/16  Intake/Output from previous day: 04/18 0701 - 04/19 0700 In: 1068 [P.O.:1068] Out: 300 [Urine:300] Intake/Output this shift: No intake/output data recorded.  General appearance: alert and cooperative Resp: clear to auscultation bilaterally Cardio: regular rate and rhythm GI: soft, NT  Lab Results:   Recent Labs  06/05/16 0851 06/06/16 0516  WBC 8.3 7.8  HGB 11.3* 10.5*  HCT 34.4* 33.2*  PLT 289 238   BMET  Recent Labs  06/06/16 0516 06/07/16 0404  NA 140 144  K 3.3* 2.9*  CL 107 108  CO2 25 27  GLUCOSE 115* 101*  BUN 7 5*  CREATININE 1.03* 1.08*  CALCIUM 8.2* 8.7*   PT/INR No results for input(s): LABPROT, INR in the last 72 hours. ABG No results for input(s): PHART, HCO3 in the last 72 hours.  Invalid input(s): PCO2, PO2  Studies/Results: Mr 3d Recon At Scanner  Result Date: 06/05/2016 CLINICAL DATA:  Right upper quadrant abdominal pain, vomiting, and diarrhea for several days. Cholelithiasis and biliary ductal dilatation seen on recent ultrasound. EXAM: MRI ABDOMEN WITHOUT AND WITH CONTRAST (INCLUDING MRCP) TECHNIQUE: Multiplanar multisequence MR imaging of the abdomen was performed both before and after the administration of intravenous contrast. Heavily T2-weighted images of the biliary and pancreatic ducts were obtained, and three-dimensional MRCP images were rendered by post processing. CONTRAST:  59mL MULTIHANCE GADOBENATE DIMEGLUMINE 529 MG/ML IV SOLN COMPARISON:  Ultrasound on 06/05/2016 FINDINGS: Lower chest: Bibasilar atelectasis noted. Hepatobiliary: No masses  identified. Mild diffuse hepatic steatosis. Gallbladder is mildly distended with several tiny gallstones in the gallbladder neck, however there is no evidence of significant gallbladder wall thickening or pericholecystic inflammatory changes. Mild diffuse biliary ductal dilatation is seen, with common bile duct measuring 12 mm. Smooth tapering of the distal common bile duct is seen, without evidence of choledocholithiasis. Pancreas: No mass or inflammatory changes. No evidence of pancreatic ductal dilatation or pancreas divisum. Spleen:  Within normal limits in size and appearance. Adrenals/Urinary Tract: No masses identified. No evidence of hydronephrosis. Stomach/Bowel: Small hiatal hernia.  Otherwise unremarkable. Vascular/Lymphatic: No pathologically enlarged lymph nodes identified. No abdominal aortic aneurysm. Other:  None. Musculoskeletal:  No suspicious bone lesions identified. IMPRESSION: Distended gallbladder with tiny gallstones. No other definite signs of acute cholecystitis. Mild diffuse biliary ductal dilatation, with common bile duct measuring 12 mm. No choledocholithiasis or other obstructing etiology visualized. Mild diffuse hepatic steatosis.  No liver mass identified. Small hiatal hernia. Bibasilar atelectasis. Electronically Signed   By: Earle Gell M.D.   On: 06/05/2016 16:35   Mr Abdomen Mrcp Moise Boring Contast  Result Date: 06/05/2016 CLINICAL DATA:  Right upper quadrant abdominal pain, vomiting, and diarrhea for several days. Cholelithiasis and biliary ductal dilatation seen on recent ultrasound. EXAM: MRI ABDOMEN WITHOUT AND WITH CONTRAST (INCLUDING MRCP) TECHNIQUE: Multiplanar multisequence MR imaging of the abdomen was performed both before and after the administration of intravenous contrast. Heavily T2-weighted images of the biliary and pancreatic ducts were obtained, and three-dimensional MRCP images were rendered by post processing. CONTRAST:  62mL MULTIHANCE GADOBENATE DIMEGLUMINE 529  MG/ML IV SOLN COMPARISON:  Ultrasound on 06/05/2016 FINDINGS: Lower chest: Bibasilar atelectasis noted. Hepatobiliary: No masses identified. Mild diffuse hepatic steatosis. Gallbladder is mildly distended with several tiny gallstones in the gallbladder neck, however there is no evidence of significant gallbladder wall thickening or pericholecystic inflammatory changes. Mild diffuse biliary ductal dilatation is seen, with common bile duct measuring 12 mm. Smooth tapering of the distal common bile duct is seen, without evidence of choledocholithiasis. Pancreas: No mass or inflammatory changes. No evidence of pancreatic ductal dilatation or pancreas divisum. Spleen:  Within normal limits in size and appearance. Adrenals/Urinary Tract: No masses identified. No evidence of hydronephrosis. Stomach/Bowel: Small hiatal hernia.  Otherwise unremarkable. Vascular/Lymphatic: No pathologically enlarged lymph nodes identified. No abdominal aortic aneurysm. Other:  None. Musculoskeletal:  No suspicious bone lesions identified. IMPRESSION: Distended gallbladder with tiny gallstones. No other definite signs of acute cholecystitis. Mild diffuse biliary ductal dilatation, with common bile duct measuring 12 mm. No choledocholithiasis or other obstructing etiology visualized. Mild diffuse hepatic steatosis.  No liver mass identified. Small hiatal hernia. Bibasilar atelectasis. Electronically Signed   By: Earle Gell M.D.   On: 06/05/2016 16:35   US Abdomen Limited Ruq  Result Date: 06/05/2016 CLINICAL DATA:  Right upper quadrant pain. EXAM: US ABDOMEN LIMITED - RIGHT UPPER QUADRANT COMPARISON:  None. FINDINGS: Gallbladder: Cholelithiasis without gallbladder wall thickening or pericholecystic fluid. No sonographic Murphy sign noted by sonographer. Common bile duct: Diameter: 10.2 mm Liver: No focal lesion identified. Increased hepatic parenchymal echogenicity. IMPRESSION: 1. Cholelithiasis without sonographic evidence of acute  cholecystitis. 2. Mildly dilated common bile duct measuring 16.1 mm of uncertain etiology. No choledocholithiasis or obstructing mass identified. If there is further clinical concern, recommend MRI/MRCP. Electronically Signed   By: Kathreen Devoid   On: 06/05/2016 09:58    Anti-infectives: Anti-infectives    None     Assessment/Plan  DM HTN Obesity  Symptomatic Cholelithiasis - MRCP showed mild diffuse bilary ductal dilatation with CBD at 50mm. No choledocholithiasis.  - To OR for lap chole IOC. Procedure risks and benefits discussed again. She agrees.     Zenovia Jarred [facility] Surgery 06/07/2016, 9:07 AM Pager: [phone] Consults: [phone] Mon-Fri 7:00 am-4:30 pm Sat-Sun 7:00 am-11:30 am    LOS: 2 days    Daylynn Stumpp E 06/07/2016

## 2016-06-07 NOTE — Progress Notes (Signed)
Transitions of Care Pharmacy Note  Plan:  Recommend discontinuing lisinopril and starting losartan in its place Educated on indications for medications Addressed concerns regarding   --------------------------------------------- Kristen Frost is an 61 y.o. female who presents with a chief complaint cholelithiasis. In anticipation of discharge, pharmacy has reviewed this patient's prior to admission medication history, as well as current inpatient medications listed per the [facility].  Current medication indications, dosing, frequency, and notable side effects reviewed with patient. patient verbalized understanding of current inpatient medication regimen and is aware that the After Visit Summary when presented, will represent the most accurate medication list at discharge.   Kristen Frost expressed concerns regarding a dry-hacking after she started lisinopril.    Assessment: Understanding of regimen: good Understanding of indications: good Potential of compliance: good Barriers to Obtaining Medications: No  Patient instructed to contact inpatient pharmacy team with further questions or concerns if needed.    Time spent preparing for discharge counseling: 30 minutes Time spent counseling patient: 10 minutes   Thank you for allowing pharmacy to be a part of this patient's care.  Dierdre Harness, Cain Sieve, PharmD Clinical Pharmacy Resident [phone] (Pager) 06/07/2016 5:48 PM

## 2016-06-07 NOTE — Anesthesia Procedure Notes (Signed)
Procedure Name: Intubation Date/Time: 06/07/2016 12:07 PM Performed by: Carney Living Pre-anesthesia Checklist: Patient identified, Emergency Drugs available, Suction available, Patient being monitored and Timeout performed Patient Re-evaluated:Patient Re-evaluated prior to inductionOxygen Delivery Method: Circle system utilized Preoxygenation: Pre-oxygenation with 100% oxygen Intubation Type: IV induction Ventilation: Mask ventilation without difficulty and Oral airway inserted - appropriate to patient size Laryngoscope Size: Mac and 4 Grade View: Grade I Tube type: Oral Tube size: 7.5 mm Number of attempts: 1 Airway Equipment and Method: Stylet Placement Confirmation: ETT inserted through vocal cords under direct vision,  positive ETCO2 and breath sounds checked- equal and bilateral Secured at: 20 cm Tube secured with: Tape Dental Injury: Teeth and Oropharynx as per pre-operative assessment

## 2016-06-07 NOTE — Progress Notes (Signed)
Subjective: [name]. Patient denies any RUQ pain with current pain regimen. Patient reports understanding of why cholecystectomy is needed. Denies n/v/d, chest pain, abdominal pain, shortness of breath.   Objective: Vital signs in last 24 hours: Vitals:   06/06/16 1403 06/06/16 2146 06/07/16 0500 06/07/16 0545  BP: (!) 121/46 127/63  (!) 113/49  Pulse: 88 84  64  Resp:  18  18  Temp: 99.1 F (37.3 C) 98.7 F (37.1 C)  98.6 F (37 C)  TempSrc: Oral     SpO2: 100% 100%  95%  Weight:   116.7 kg (257 lb 4.4 oz)   Height:       Weight change: 0.3 kg (10.6 oz)  Intake/Output Summary (Last 24 hours) at 06/07/16 0840 Last data filed at 06/06/16 2125  Gross per 24 hour  Intake             1068 ml  Output              300 ml  Net              768 ml   General: NAD on pain medication, conversational and pleasant HEENT: NCAT. PERRL with ROM intact. MMM. CV: RRR w/o m/r/g Pulm: CTAB w/o wheezing, rales, or rhonchi Abd: Mild TTP RUQ improved with pain meds. Negative Murphy's sign. Non-tender in all other quadrants. Normal bowel sounds. No rebound or guarding. No hepatosplenomegaly. No other palpable masses. Ext: No peripheral edema in all extremities. DP and PT pulses 2+ Neuro: A&O x 3. Normal strength and sensation in all extremities. Skin: No skin lesions or rashes, no erythema or discoloration, no warmth to touch.  Lab Results: Reviewed in EMR  Micro Results: Recent Results (from the past 240 hour(s))  Surgical pcr screen     Status: None   Collection Time: 06/05/16 10:39 PM  Result Value Ref Range Status   MRSA, PCR NEGATIVE NEGATIVE Final   Staphylococcus aureus NEGATIVE NEGATIVE Final    Comment:        The Xpert SA Assay (FDA approved for NASAL specimens in patients over 51 years of age), is one component of a comprehensive surveillance program.  Test performance has been validated by [facility] for patients greater than or equal to 90 year old. It is not  intended to diagnose infection nor to guide or monitor treatment.   Surgical pcr screen     Status: None   Collection Time: 06/06/16 10:54 PM  Result Value Ref Range Status   MRSA, PCR NEGATIVE NEGATIVE Final   Staphylococcus aureus NEGATIVE NEGATIVE Final    Comment:        The Xpert SA Assay (FDA approved for NASAL specimens in patients over 70 years of age), is one component of a comprehensive surveillance program.  Test performance has been validated by [facility] for patients greater than or equal to 9 year old. It is not intended to diagnose infection nor to guide or monitor treatment.    Studies/Results: Mr 3d Recon At Scanner  Result Date: 06/05/2016 CLINICAL DATA:  Right upper quadrant abdominal pain, vomiting, and diarrhea for several days. Cholelithiasis and biliary ductal dilatation seen on recent ultrasound. EXAM: MRI ABDOMEN WITHOUT AND WITH CONTRAST (INCLUDING MRCP) TECHNIQUE: Multiplanar multisequence MR imaging of the abdomen was performed both before and after the administration of intravenous contrast. Heavily T2-weighted images of the biliary and pancreatic ducts were obtained, and three-dimensional MRCP images were rendered by post processing. CONTRAST:  52mL MULTIHANCE GADOBENATE DIMEGLUMINE  529 MG/ML IV SOLN COMPARISON:  Ultrasound on 06/05/2016 FINDINGS: Lower chest: Bibasilar atelectasis noted. Hepatobiliary: No masses identified. Mild diffuse hepatic steatosis. Gallbladder is mildly distended with several tiny gallstones in the gallbladder neck, however there is no evidence of significant gallbladder wall thickening or pericholecystic inflammatory changes. Mild diffuse biliary ductal dilatation is seen, with common bile duct measuring 12 mm. Smooth tapering of the distal common bile duct is seen, without evidence of choledocholithiasis. Pancreas: No mass or inflammatory changes. No evidence of pancreatic ductal dilatation or pancreas divisum. Spleen:  Within  normal limits in size and appearance. Adrenals/Urinary Tract: No masses identified. No evidence of hydronephrosis. Stomach/Bowel: Small hiatal hernia.  Otherwise unremarkable. Vascular/Lymphatic: No pathologically enlarged lymph nodes identified. No abdominal aortic aneurysm. Other:  None. Musculoskeletal:  No suspicious bone lesions identified. IMPRESSION: Distended gallbladder with tiny gallstones. No other definite signs of acute cholecystitis. Mild diffuse biliary ductal dilatation, with common bile duct measuring 12 mm. No choledocholithiasis or other obstructing etiology visualized. Mild diffuse hepatic steatosis.  No liver mass identified. Small hiatal hernia. Bibasilar atelectasis. Electronically Signed   By: Earle Gell M.D.   On: 06/05/2016 16:35   Mr Abdomen Mrcp Moise Boring Contast  Result Date: 06/05/2016 CLINICAL DATA:  Right upper quadrant abdominal pain, vomiting, and diarrhea for several days. Cholelithiasis and biliary ductal dilatation seen on recent ultrasound. EXAM: MRI ABDOMEN WITHOUT AND WITH CONTRAST (INCLUDING MRCP) TECHNIQUE: Multiplanar multisequence MR imaging of the abdomen was performed both before and after the administration of intravenous contrast. Heavily T2-weighted images of the biliary and pancreatic ducts were obtained, and three-dimensional MRCP images were rendered by post processing. CONTRAST:  57mL MULTIHANCE GADOBENATE DIMEGLUMINE 529 MG/ML IV SOLN COMPARISON:  Ultrasound on 06/05/2016 FINDINGS: Lower chest: Bibasilar atelectasis noted. Hepatobiliary: No masses identified. Mild diffuse hepatic steatosis. Gallbladder is mildly distended with several tiny gallstones in the gallbladder neck, however there is no evidence of significant gallbladder wall thickening or pericholecystic inflammatory changes. Mild diffuse biliary ductal dilatation is seen, with common bile duct measuring 12 mm. Smooth tapering of the distal common bile duct is seen, without evidence of  choledocholithiasis. Pancreas: No mass or inflammatory changes. No evidence of pancreatic ductal dilatation or pancreas divisum. Spleen:  Within normal limits in size and appearance. Adrenals/Urinary Tract: No masses identified. No evidence of hydronephrosis. Stomach/Bowel: Small hiatal hernia.  Otherwise unremarkable. Vascular/Lymphatic: No pathologically enlarged lymph nodes identified. No abdominal aortic aneurysm. Other:  None. Musculoskeletal:  No suspicious bone lesions identified. IMPRESSION: Distended gallbladder with tiny gallstones. No other definite signs of acute cholecystitis. Mild diffuse biliary ductal dilatation, with common bile duct measuring 12 mm. No choledocholithiasis or other obstructing etiology visualized. Mild diffuse hepatic steatosis.  No liver mass identified. Small hiatal hernia. Bibasilar atelectasis. Electronically Signed   By: Earle Gell M.D.   On: 06/05/2016 16:35   US Abdomen Limited Ruq  Result Date: 06/05/2016 CLINICAL DATA:  Right upper quadrant pain. EXAM: US ABDOMEN LIMITED - RIGHT UPPER QUADRANT COMPARISON:  None. FINDINGS: Gallbladder: Cholelithiasis without gallbladder wall thickening or pericholecystic fluid. No sonographic Murphy sign noted by sonographer. Common bile duct: Diameter: 10.2 mm Liver: No focal lesion identified. Increased hepatic parenchymal echogenicity. IMPRESSION: 1. Cholelithiasis without sonographic evidence of acute cholecystitis. 2. Mildly dilated common bile duct measuring 16.1 mm of uncertain etiology. No choledocholithiasis or obstructing mass identified. If there is further clinical concern, recommend MRI/MRCP. Electronically Signed   By: Kathreen Devoid   On: 06/05/2016 09:58   Medications: I have  reviewed the patient's current medications. Scheduled Meds: . amLODipine  5 mg Oral Daily  . atorvastatin  40 mg Oral Daily  . enoxaparin (LOVENOX) injection  40 mg Subcutaneous Q24H  . insulin aspart  0-9 Units Subcutaneous TID WC  .  lisinopril  20 mg Oral Daily   Continuous Infusions: . potassium chloride (KCL MULTIRUN) 30 mEq in 265 mL IVPB 30 mEq (06/07/16 0835)   PRN Meds:.HYDROmorphone (DILAUDID) injection, ketorolac, ondansetron **OR** ondansetron (ZOFRAN) IV, senna-docusate Assessment/Plan: Principal Problem:   Symptomatic cholelithiasis Active Problems:   HTN (hypertension)   T2DM (type 2 diabetes mellitus) ([place])  INFANTOF VILLAGOMEZ is a 61 y.o female with PMHx HTN, T2DM, morbid obesity, arthritis, vitamin D deficiency, OSA, restless leg syndrome, who presents with RUQ pain.  1. Cholelithiasis - Patient presented with worsening RUQ pain with radiation to the back without fever, jaundice, altered mental status, or hypotension. Normal WBC. Normal lipase. Transaminitis (AST/ALT) resolving this morning without elevated AP; resolving elevated total bilirubin. Limited abdominal U/S which showed cholelithiasis without sonographic evidence of acute cholecystitis, as well as a mildly dilated common bile duct measuring 95.2 mm of uncertain etiology withoutcholedocholithiasis or obstructing mass identified. MRCP showed distended gallbladder with tiny gallstones without other definite signs of acute cholecystitis; some mild diffuse biliary ductal dilatation, but not choledocholithiasis. General surgery on board to do cholecystectomy with cholangiogram which will happen today 4/19. Patient became NPO at midnight. For now, pain/nausea control and oral hydration. No indication for abx therapy. - Cholecystectomy with cholangiogram, today 4/19 - D/C IVFs; oral fluids until midnight - Pain control: Dilaudid 0.5 mg every 3 hours PRN, Toradol 15 mg every 6 hours PRN - Zofran prn - NPO - sips with meds - Trend CBC for WBC - Trend CMP for liver enzymes  2. Hypertension - elevated BP at presentation 156/80, likely due to pain. After pain medications, at goal 138/62. Cr at baseline 1.07. Will continue home medications and CTM BPs. -  Continue home lisinopril 5 mg daily and amlodipine 5 mg  3. T2DM - A1C 6.4 in 04/2016 at goal. On home metformin 1000 mg bid; will d/c here in hospital and do SSI. - D/C metformin - SSI  4. OSA - We do not have records for CPAP titration as sleep study a few weeks ago has not been read. Will hold off CPAP for now. No supplemental O2 at home.  DVT/PE ppx: Lovenox CODE: FULL FEN: NPO - sips with meds  This is a Careers information officer Note.  The care of the patient was discussed with Dr. Lynnae January and the assessment and plan formulated with their assistance.  Please see their attached note for official documentation of the daily encounter.   LOS: 2 days   Esaw Dace, Medical Student 06/07/2016, 8:40 AM

## 2016-06-07 NOTE — Op Note (Signed)
06/05/2016 - 06/07/2016  1:55 PM  PATIENT:  Kristen Frost  61 y.o. female  PRE-OPERATIVE DIAGNOSIS:  biliary colic  POST-OPERATIVE DIAGNOSIS:  biliary colic  PROCEDURE:  Procedure(s): LAPAROSCOPIC CHOLECYSTECTOMY  SURGEON:  Surgeon(s): Georganna Skeans, MD  ASSISTANTS: none   ANESTHESIA:   Local, general  EBL:  No intake/output data recorded.  BLOOD ADMINISTERED:none  DRAINS: none   SPECIMEN:  Excision  DISPOSITION OF SPECIMEN:  PATHOLOGY  COUNTS:  YES  DICTATION: .Dragon Dictation Findings: Acute cholecystitis  Procedure in detail: Kristen Frost presents for cholecystectomy. She was identified the preop holding area. She received intravenous antibiotics. Informed consent was obtained. She was brought to the operating room and general endotracheal anesthesia was administered by the anesthesia staff. Her abdomen was prepped and draped in sterile fashion. Timeout procedure was performed.The supraumbilical region was infiltrated with local. supraumbilical incision was made. Subcutaneous tissues were dissected down revealing the anterior fascia. This was divided sharply along the midline. Peritoneal cavity was entered under direct vision without complication. A 0 Vicryl pursestring was placed around the fascial opening. Hassan trocar was inserted into the abdomen. The abdomen was insufflated with carbon dioxide in standard fashion. Under direct vision a 5 mm epigastric and 5 mm right lateral ports were placed. Local was used at each port site. The dome of the gallbladder was retracted superior medially. It was acutely inflamed. Some omental adhesions were swept down revealing the infundibulum. This was retracted inferiorly laterally. Dissection began laterally and progressed medially easily identified and an anterior branch of the cystic artery. This was clipped twice proximally once distally and divided. The cystic duct was then identified. It was fairly wide in diameter which is consistent  with her recent likely choledocholithiasis. Dissection continued into the critical view of safety was obtained 20 cystic duct, and the gallbladder, and the liver. We could also see the cystic duct common bile duct junction. A clip was placed on the infundibular cystic duct junction. Small neck was made in the cystic duct and a cholangiocatheter was inserted. We could not get the cholangiocatheter Tisseel in order to get a cholangiogram and despite multiple attempts. I initially tried a Cook catheter. Then I tried a reddick catheter. I could not get a cholangiogram and this was aborted. 3 clips were placed proximally on the cystic duct and it was divided. The gallbladder was taken off the liver bed with cautery. I encountered the posterior branch of the cystic artery. This was clipped twice proximally and divided. A couple stones fell out of the gallbladder. I upsized the epigastric port to an 11 mm. I used the stone grasper to remove the stones. The gallbladder was taken off the liver bed. He was placed in a bag and removed from the abdomen. It was sent to pathology. The liver bed was cauterized to get excellent hemostasis. I also placed a piece of Surgicel no due to the fatty changes in her liver and its friability. The abdomen was copiously irrigated with saline. Irrigation returned clear. Clips remaining good position. Hemostasis was good in the liver bed. I closed the supraumbilical fascia with interrupted 0 Vicryl passed laparoscopically using the PMI. Other ports were removed after releasing the pneumoperitoneum. All wounds were irrigated and the skin of each was closed with 4-0 Vicryl and Dermabond. All counts were correct. She tolerated the procedure well without apparent complication was taken recovery in stable condition.  PATIENT DISPOSITION:  PACU - hemodynamically stable.   Delay start of Pharmacological VTE agent (>24hrs) due  to surgical blood loss or risk of bleeding:  no  Georganna Skeans, MD,  MPH, FACS Pager: [phone]  4/19/20181:55 PM

## 2016-06-07 NOTE — Progress Notes (Signed)
RT set up CPAP at patient's bedside and educated patient on how to use. Patient stated she would put it on when she is ready to go to bed. RT advised patient to have RT called if she needs assistance. RT will monitor as needed.

## 2016-06-07 NOTE — Anesthesia Preprocedure Evaluation (Addendum)
Anesthesia Evaluation  Patient identified by MRN, date of birth, ID band Patient awake    Reviewed: Allergy & Precautions, H&P , NPO status , Patient's Chart, lab work & pertinent test results  Airway Mallampati: III  TM Distance: >3 FB Neck ROM: Full    Dental no notable dental hx. (+) Edentulous Upper, Edentulous Lower, Dental Advisory Given   Pulmonary sleep apnea and Continuous Positive Airway Pressure Ventilation ,    Pulmonary exam normal breath sounds clear to auscultation       Cardiovascular hypertension, Pt. on medications  Rhythm:Regular Rate:Normal     Neuro/Psych negative neurological ROS  negative psych ROS   GI/Hepatic negative GI ROS, Neg liver ROS,   Endo/Other  diabetes, Type 2, Oral Hypoglycemic AgentsMorbid obesity  Renal/GU negative Renal ROS  negative genitourinary   Musculoskeletal  (+) Arthritis , Osteoarthritis,    Abdominal   Peds  Hematology negative hematology ROS (+) anemia ,   Anesthesia Other Findings   Reproductive/Obstetrics negative OB ROS                           Anesthesia Physical Anesthesia Plan  ASA: III  Anesthesia Plan: General   Post-op Pain Management:    Induction: Intravenous  Airway Management Planned: Oral ETT  Additional Equipment:   Intra-op Plan:   Post-operative Plan: Extubation in OR  Informed Consent: I have reviewed the patients History and Physical, chart, labs and discussed the procedure including the risks, benefits and alternatives for the proposed anesthesia with the patient or authorized representative who has indicated his/her understanding and acceptance.   Dental advisory given  Plan Discussed with: CRNA, Anesthesiologist and Surgeon  Anesthesia Plan Comments:        Anesthesia Quick Evaluation

## 2016-06-07 NOTE — Transfer of Care (Signed)
Immediate Anesthesia Transfer of Care Note  Patient: Kristen Frost  Procedure(s) Performed: Procedure(s): LAPAROSCOPIC CHOLECYSTECTOMY (N/A)  Patient Location: PACU  Anesthesia Type:General  Level of Consciousness: awake, alert , oriented and patient cooperative  Airway & Oxygen Therapy: Patient Spontanous Breathing and Patient connected to nasal cannula oxygen  Post-op Assessment: Report given to RN, Post -op Vital signs reviewed and stable and Patient moving all extremities X 4  Post vital signs: Reviewed and stable  Last Vitals:  Vitals:   06/07/16 0545 06/07/16 1405  BP: (!) 113/49 128/65  Pulse: 64 79  Resp: 18   Temp: 37 C 36.6 C    Last Pain:  Vitals:   06/07/16 1405  TempSrc:   PainSc: 0-No pain         Complications: No apparent anesthesia complications

## 2016-06-07 NOTE — Progress Notes (Signed)
   Subjective:  Patient feels well, no acute events overnight. She is NPO for planned laparoscopic cholecystectomy today.   Objective:  Vital signs in last 24 hours: Vitals:   06/06/16 1403 06/06/16 2146 06/07/16 0500 06/07/16 0545  BP: (!) 121/46 127/63  (!) 113/49  Pulse: 88 84  64  Resp:  18  18  Temp: 99.1 F (37.3 C) 98.7 F (37.1 C)  98.6 F (37 C)  TempSrc: Oral     SpO2: 100% 100%  95%  Weight:   257 lb 4.4 oz (116.7 kg)   Height:       General: resting in bed, no acute distress Cardiac: RRR, no rubs, murmurs or gallops Pulm: clear to auscultation bilaterally, moving normal volumes of air Abd: soft, mild tenderness periumbilical region, nondistended, hypoactive bowel sounds Neuro: alert and oriented X3   Assessment/Plan:  Principal Problem:   Symptomatic cholelithiasis Active Problems:   HTN (hypertension)   T2DM (type 2 diabetes mellitus) ([place])  [name] is a 61 year old female with PMH of T2DM, HTN, suspected OSA, Restless Leg Syndrome, and Vitamin D deficiency who presents with abdominal pain.  Symptomatic Cholelithiasis: Patient with RUQ pain and gallstones on U/S without evidence for cholecystitis. MRCP showed a mildly dilated CBD at 12 mm without evidence of choledocholithiasis, obstruction, or cholecystitis. Surgery is following and plan for laparoscopic cholecystectomy today.  - NPO - Lap chole with cholangiogram planned today - IV Dilaudid 0.5 mg q3h prn for severe pain - IV Toradol 15 mg q6h prn - Zofran prn nausea - Monitor CBC, CMET  Hypokalemia: K 2.9 - IV KCl 30 mEq now while NPO - Monitor electrolytes, replete as needed  T2DM: Last Hgb A1c was 6.4 on 05/17/16. - SSI-S  HTN: - Continue home Amlodipine 5 mg daily and Lisinopril 20 mg daily when taking   HLD: - Continue home Atorvastatin 40 mg daily  Dispo: Anticipated discharge in approximately 1-2 day(s).   Zada Finders, MD 06/07/2016, 8:44 AM

## 2016-06-07 NOTE — Anesthesia Postprocedure Evaluation (Signed)
Anesthesia Post Note  Patient: Kristen Frost  Procedure(s) Performed: Procedure(s) (LRB): LAPAROSCOPIC CHOLECYSTECTOMY (N/A)  Patient location during evaluation: PACU Anesthesia Type: General Level of consciousness: awake Pain management: pain level controlled Vital Signs Assessment: post-procedure vital signs reviewed and stable Respiratory status: spontaneous breathing Cardiovascular status: stable Anesthetic complications: no       Last Vitals:  Vitals:   06/07/16 1513 06/07/16 1548  BP: (!) 153/61 (!) 145/74  Pulse: 76 69  Resp:  20  Temp:  36.9 C    Last Pain:  Vitals:   06/07/16 1455  TempSrc:   PainSc: 0-No pain                 Hitoshi Werts

## 2016-06-08 ENCOUNTER — Telehealth: Payer: Self-pay

## 2016-06-08 ENCOUNTER — Encounter (HOSPITAL_COMMUNITY): Payer: Self-pay | Admitting: General Surgery

## 2016-06-08 DIAGNOSIS — I10 Essential (primary) hypertension: Secondary | ICD-10-CM

## 2016-06-08 DIAGNOSIS — Z8719 Personal history of other diseases of the digestive system: Secondary | ICD-10-CM

## 2016-06-08 DIAGNOSIS — E119 Type 2 diabetes mellitus without complications: Secondary | ICD-10-CM

## 2016-06-08 DIAGNOSIS — Z79899 Other long term (current) drug therapy: Secondary | ICD-10-CM

## 2016-06-08 DIAGNOSIS — Z9049 Acquired absence of other specified parts of digestive tract: Secondary | ICD-10-CM

## 2016-06-08 DIAGNOSIS — Z7984 Long term (current) use of oral hypoglycemic drugs: Secondary | ICD-10-CM

## 2016-06-08 DIAGNOSIS — J309 Allergic rhinitis, unspecified: Secondary | ICD-10-CM

## 2016-06-08 DIAGNOSIS — E876 Hypokalemia: Secondary | ICD-10-CM

## 2016-06-08 DIAGNOSIS — E669 Obesity, unspecified: Secondary | ICD-10-CM

## 2016-06-08 LAB — CBC
HCT: 31.6 % — ABNORMAL LOW (ref 36.0–46.0)
Hemoglobin: 9.9 g/dL — ABNORMAL LOW (ref 12.0–15.0)
MCH: 24.4 pg — ABNORMAL LOW (ref 26.0–34.0)
MCHC: 31.3 g/dL (ref 30.0–36.0)
MCV: 77.8 fL — AB (ref 78.0–100.0)
PLATELETS: 271 10*3/uL (ref 150–400)
RBC: 4.06 MIL/uL (ref 3.87–5.11)
RDW: 16.7 % — AB (ref 11.5–15.5)
WBC: 10.3 10*3/uL (ref 4.0–10.5)

## 2016-06-08 LAB — COMPREHENSIVE METABOLIC PANEL
ALT: 215 U/L — ABNORMAL HIGH (ref 14–54)
ANION GAP: 8 (ref 5–15)
AST: 168 U/L — AB (ref 15–41)
Albumin: 2.9 g/dL — ABNORMAL LOW (ref 3.5–5.0)
Alkaline Phosphatase: 139 U/L — ABNORMAL HIGH (ref 38–126)
BILIRUBIN TOTAL: 1.2 mg/dL (ref 0.3–1.2)
BUN: 6 mg/dL (ref 6–20)
CALCIUM: 9 mg/dL (ref 8.9–10.3)
CHLORIDE: 109 mmol/L (ref 101–111)
CO2: 27 mmol/L (ref 22–32)
CREATININE: 1.1 mg/dL — AB (ref 0.44–1.00)
GFR calc Af Amer: >60 mL/min (ref >60)
GFR, EST NON AFRICAN AMERICAN: 53 mL/min — AB (ref >60)
Glucose, Bld: 130 mg/dL — ABNORMAL HIGH (ref 65–99)
Potassium: 3.6 mmol/L (ref 3.5–5.1)
Sodium: 144 mmol/L (ref 135–145)
TOTAL PROTEIN: 6.6 g/dL (ref 6.5–8.1)

## 2016-06-08 LAB — GLUCOSE, CAPILLARY
GLUCOSE-CAPILLARY: 140 mg/dL — AB (ref 65–99)
Glucose-Capillary: 126 mg/dL — ABNORMAL HIGH (ref 65–99)
Glucose-Capillary: 146 mg/dL — ABNORMAL HIGH (ref 65–99)
Glucose-Capillary: 123 mg/dL — ABNORMAL HIGH (ref 65–99)

## 2016-06-08 LAB — MAGNESIUM: Magnesium: 1.9 mg/dL (ref 1.7–2.4)

## 2016-06-08 MED ORDER — FLUTICASONE PROPIONATE 50 MCG/ACT NA SUSP
2.0000 | Freq: Every day | NASAL | Status: DC
  Administered 2016-06-08 – 2016-06-09 (×2): 2 via NASAL
  Filled 2016-06-08: qty 16

## 2016-06-08 MED ORDER — ACETAMINOPHEN 325 MG PO TABS: 650.0000 mg | ORAL_TABLET | Freq: Four times a day (QID) | ORAL | Status: DC | PRN

## 2016-06-08 MED ORDER — GUAIFENESIN ER 600 MG PO TB12
600.0000 mg | ORAL_TABLET | Freq: Two times a day (BID) | ORAL | Status: DC
  Administered 2016-06-08 – 2016-06-09 (×3): 600 mg via ORAL
  Filled 2016-06-08 (×3): qty 1

## 2016-06-08 MED ORDER — LORATADINE 10 MG PO TABS
10.0000 mg | ORAL_TABLET | Freq: Every day | ORAL | Status: DC
  Administered 2016-06-08 – 2016-06-09 (×2): 10 mg via ORAL
  Filled 2016-06-08 (×2): qty 1

## 2016-06-08 MED ORDER — ENOXAPARIN SODIUM 40 MG/0.4ML ~~LOC~~ SOLN
40.0000 mg | SUBCUTANEOUS | Status: DC
  Administered 2016-06-08: 40 mg via SUBCUTANEOUS
  Filled 2016-06-08: qty 0.4

## 2016-06-08 NOTE — Progress Notes (Signed)
Subjective: Patient is now s/p cholecystectomy yesterday. No complications with surgery. [name]. Denies any pain s/p surgery; only reports discomfort. Denies any drainage from surgery wounds. Otherwise, she is feeling well but wants to take things slow in terms of discharge.  Also, patient had pressure behind her eyes which she attributes to sinuses/allergies; second time this has happened within the past month; reports that Claritin helps at home. Denies worsening headache, lightheadedness, dizziness, vision changes, sneezing. Reports some coughing.  Objective: Vital signs in last 24 hours: Vitals:   06/07/16 2214 06/07/16 2225 06/08/16 0638 06/08/16 0714  BP:  134/74 133/77   Pulse: 72 81 69   Resp: 18 18 18    Temp:  99.4 F (37.4 C) 98.1 F (36.7 C)   TempSrc:  Oral    SpO2: 99% 93% 96%   Weight:    116.7 kg (257 lb 4.4 oz)  Height:       Weight change:   Intake/Output Summary (Last 24 hours) at 06/08/16 0957 Last data filed at 06/08/16 0446  Gross per 24 hour  Intake             1605 ml  Output             1725 ml  Net             -120 ml   General: NAD on pain medication, conversational and pleasant HEENT: NCAT. MMM. CV: RRR w/o m/r/g Pulm: CTAB w/o wheezing, rales, or rhonchi Abd: MildTTP RUQ improved with pain meds. Non-tender in all other quadrants. Normal bowel sounds. No rebound or guarding. No hepatosplenomegaly. No other palpable masses. Laparoscopic surgery sites x 4 healing well, closed without drainage and no surrounding erythema or warmth. Ext: No peripheral edema in all extremities. Neuro: A&O x 3. Normal strength and sensation in all extremities. Skin: No skin lesions or rashes, no erythema or discoloration, no warmth to touch.  Lab Results: Reviewed in EMR. Micro Results: Recent Results (from the past 240 hour(s))  Surgical pcr screen     Status: None   Collection Time: 06/05/16 10:39 PM  Result Value Ref Range Status   MRSA, PCR NEGATIVE NEGATIVE  Final   Staphylococcus aureus NEGATIVE NEGATIVE Final    Comment:        The Xpert SA Assay (FDA approved for NASAL specimens in patients over 85 years of age), is one component of a comprehensive surveillance program.  Test performance has been validated by [facility] for patients greater than or equal to 64 year old. It is not intended to diagnose infection nor to guide or monitor treatment.   Surgical pcr screen     Status: None   Collection Time: 06/06/16 10:54 PM  Result Value Ref Range Status   MRSA, PCR NEGATIVE NEGATIVE Final   Staphylococcus aureus NEGATIVE NEGATIVE Final    Comment:        The Xpert SA Assay (FDA approved for NASAL specimens in patients over 30 years of age), is one component of a comprehensive surveillance program.  Test performance has been validated by [facility] for patients greater than or equal to 40 year old. It is not intended to diagnose infection nor to guide or monitor treatment.    Studies/Results: No results found. Medications: I have reviewed the patient's current medications. Scheduled Meds: . amLODipine  5 mg Oral Daily  . atorvastatin  40 mg Oral Daily  . enoxaparin (LOVENOX) injection  40 mg Subcutaneous Q24H  . fluticasone  2  spray Each Nare Daily  . guaiFENesin  600 mg Oral BID  . insulin aspart  0-9 Units Subcutaneous TID WC  . loratadine  10 mg Oral Daily  . losartan  25 mg Oral Daily   Continuous Infusions: PRN Meds:.HYDROmorphone (DILAUDID) injection, ondansetron **OR** ondansetron (ZOFRAN) IV, oxyCODONE, senna-docusate Assessment/Plan: Principal Problem:   Symptomatic cholelithiasis Active Problems:   HTN (hypertension)   T2DM (type 2 diabetes mellitus) ([place])   Hypokalemia  Kristen Frost is a 62 y.o female with PMHx HTN, T2DM, morbid obesity, arthritis, vitamin D deficiency, OSA, restless leg syndrome, who presents with RUQ pain.  1. Cholelithiasis now s/p cholecystectomy - Patient presented with  worsening RUQ pain with radiation to the back without fever, jaundice, altered mental status, or hypotension. Normal WBC. Normal lipase. Transaminitis (AST/ALT) and hyperbilirubinemia resolving.Limited abdominal U/S which showed cholelithiasis without sonographic evidence of acute cholecystitis, as well as a mildly dilated common bile duct measuring 90.2 mm of uncertain etiology withoutcholedocholithiasis or obstructing mass identified. MRCP showed distended gallbladder with tiny gallstones without other definite signs of acute cholecystitis; some mild diffuse biliary ductal dilatation, but notcholedocholithiasis. Cholecystectomy with cholangiogram 4/09 without complications. For now, pain/nausea control and oralhydration. No indication for abx therapy. - Regular diet - PO pain meds - Zofran prn - Liquid diet, advance as tolerated - Trend CBC for H/H monitoring - Trend BMP for Cr and K  2. Hypertension - Patient HR was 59 with BP 110's/70's. Will hold amlodipine. Pharmacy came by and saw patient; noticed patient has had new persistent dry cough while taking lisinopril. Will switch lisinopril to losartan.  CTM BPs. - Stop lisinopril - Start losartan 25 mg daily - Hold amlodipine 5 mg today  3. T2DM - A1C 6.4 in 04/2016 at goal. On home metformin 1000 mg bid; will d/c here in hospital and do SSI. - D/C metformin - SSI  4. OSA - RRT titrated CPAP last night. No supplemental O2 needed here or at home. Satting well on RA. - Continue CPAP at night  5. Seasonal allergies - Patient had pressure behind her eyes which she attributes to sinuses/allergies; second time this has happened within the past month; reports that Claritin helps at home. Denies worsening headache, lightheadedness, dizziness, vision changes, sneezing. Reports some coughing. Will start Claritin prn. - loratadine 10 mg  DVT/PE ppx: Lovenox CODE: FULL FEN: Clear liquid diet  This is a Careers information officer Note.  The care of the  patient was discussed with Dr. Lynnae January and the assessment and plan formulated with their assistance.  Please see their attached note for official documentation of the daily encounter.   LOS: 3 days   Esaw Dace, Medical Student 06/08/2016, 9:57 AM

## 2016-06-08 NOTE — Progress Notes (Signed)
[facility] Progress Note  1 Day Post-Op  Subjective: CC: abdominal soreness Pt reports mild abdominal soreness but has not required any pain medication. Tolerating clears. +flatus. Small BM last night. Mobilizing to bathroom.  Objective: Vital signs in last 24 hours: Temp:  [97.9 F (36.6 C)-99.4 F (37.4 C)] 98.1 F (36.7 C) (04/20 7902) Pulse Rate:  [66-85] 69 (04/20 0638) Resp:  [18-21] 18 (04/20 4097) BP: (128-153)/(61-77) 133/77 (04/20 3532) SpO2:  [93 %-100 %] 96 % (04/20 9924) Weight:  [116.7 kg (257 lb 4.4 oz)] 116.7 kg (257 lb 4.4 oz) (04/20 0714) Last BM Date: 06/04/16  Intake/Output from previous day: 04/19 0701 - 04/20 0700 In: 2683 [P.O.:240; I.V.:1100; IV Piggyback:265] Out: 4196 [Urine:1725] Intake/Output this shift: No intake/output data recorded.  PE: Gen:  Alert, NAD, pleasant Card:  Regular rate and rhythm Pulm:  Normal effort, clear to auscultation bilaterally Abd: Soft, obese, appropriately tender, non-distended, bowel sounds present in all 4 quadrants, incisions C/D/I Psych: A&Ox3  Lab Results:   Recent Labs  06/06/16 0516 06/07/16 1153 06/08/16 0423  WBC 7.8  --  10.3  HGB 10.5* 10.5* 9.9*  HCT 33.2* 31.0* 31.6*  PLT 238  --  271   BMET  Recent Labs  06/07/16 0404 06/07/16 1153 06/08/16 0423  NA 144 148* 144  K 2.9* 3.4* 3.6  CL 108  --  109  CO2 27  --  27  GLUCOSE 101*  --  130*  BUN 5*  --  6  CREATININE 1.08*  --  1.10*  CALCIUM 8.7*  --  9.0   PT/INR No results for input(s): LABPROT, INR in the last 72 hours. CMP     Component Value Date/Time   NA 144 06/08/2016 0423   NA 145 (H) 03/01/2016 1437   K 3.6 06/08/2016 0423   CL 109 06/08/2016 0423   CO2 27 06/08/2016 0423   GLUCOSE 130 (H) 06/08/2016 0423   BUN 6 06/08/2016 0423   BUN 13 03/01/2016 1437   CREATININE 1.10 (H) 06/08/2016 0423   CALCIUM 9.0 06/08/2016 0423   PROT 6.6 06/08/2016 0423   ALBUMIN 2.9 (L) 06/08/2016 0423   AST 168 (H)  06/08/2016 0423   ALT 215 (H) 06/08/2016 0423   ALKPHOS 139 (H) 06/08/2016 0423   BILITOT 1.2 06/08/2016 0423   GFRNONAA 53 (L) 06/08/2016 0423   GFRAA >60 06/08/2016 0423   Lipase     Component Value Date/Time   LIPASE 19 06/05/2016 0851   Anti-infectives: Anti-infectives    Start     Dose/Rate Route Frequency Ordered Stop   06/07/16 1146  ceFAZolin (ANCEF) 2-4 GM/100ML-% IVPB  Status:  Discontinued    Comments:  Forte, Lindsi   : cabinet override      06/07/16 1146 06/07/16 1726     Assessment/Plan Symptomatic cholelithiasis S/P laparopscopic cholecystectomy 06/07/16, Dr. Georganna Skeans  Pt is tolerating PO, pain controlled, LFT's trending down, incisions clean/dry, bowel function returining and pt mobilizing. May advance diet to low fat as tolerated. I have arranged for patient to follow up in our office and placed discharge instructions in her chart. She does not require PO abx at discharge from a surgical perspective.  General surgery will sign off. Call with questions/concerns.   LOS: 3 days    [facility] 06/08/2016, 8:42 AM Pager: [phone] Consults: [phone] Mon-Fri 7:00 am-4:30 pm Sat-Sun 7:00 am-11:30 am

## 2016-06-08 NOTE — Discharge Instructions (Signed)

## 2016-06-08 NOTE — Progress Notes (Signed)
Patient already wearing CPAP when RT entered room. Humidifier has sterile water in it and patient is tolerating CPAP well at this time. RT will monitor as needed.

## 2016-06-08 NOTE — Telephone Encounter (Signed)
Hospital TOC per dr burns, discharge 06/09/2006, appt 06/22/2016.

## 2016-06-08 NOTE — Progress Notes (Signed)
   Subjective: Patient was seen and examined this morning. She had an episode of sinus pressure and congestion which scared her this morning. She has a history of allergies. She states her abdomen is sore, but denies pain, nausea, vomiting or diarrhea. She is passing gas. She tolerated her clear liquid diet this morning.   Objective: Vital signs in last 24 hours: Vitals:   06/07/16 2214 06/07/16 2225 06/08/16 0638 06/08/16 0714  BP:  134/74 133/77   Pulse: 72 81 69   Resp: 18 18 18    Temp:  99.4 F (37.4 C) 98.1 F (36.7 C)   TempSrc:  Oral    SpO2: 99% 93% 96%   Weight:    257 lb 4.4 oz (116.7 kg)  Height:       Physical Exam General: Vital signs reviewed.  Patient is obese, in no acute distress and cooperative with exam.  Cardiovascular: RRR Pulmonary/Chest: Clear to auscultation bilaterally, no wheezes, rales, or rhonchi. Abdominal: Soft, non-tender, non-distended, BS +, no guarding present. Surgical incisions C/D/I. Extremities: No lower extremity edema bilaterally, pulses symmetric and intact bilaterally. Neurological: Awake, alert, moving all extremities, answers all questions appropriately  Skin: Warm, dry and intact.   Assessment/Plan: Principal Problem:   Symptomatic cholelithiasis Active Problems:   HTN (hypertension)   T2DM (type 2 diabetes mellitus) ([place])   Hypokalemia  Ms. Mally is a 86 F with PMHx of T2DM, HTN, suspected OSA who presented on 4/17 with complaint of abdominal pain and found to have symptomatic cholelithiasis.  Symptomatic Cholelithiasis s/p Lap Chole: Patient is post-op day 1. Passing gas, had a small BM, tolerating clear liquids. Pain is controlled. No fever, chills, nausea, or vomiting. Incisions are C/D/I.  -Ambulate patient -Advance diet to [facility] -D/C dilaudid -Oxycodone 5-10 mg Q4H prn -Tylenol prn -Follow up outpatient with general surgery, surgery has signed off -Will likely d/c tomorrow morning if pain remains controlled, she ambulates  and tolerates a regular diet  Allergic Rhinitis: Patient reports history of allergic rhinitis with acute onset of sinus pressure and congestion this morning. She takes Claritin at home.  -Claritin QD -Mucinex 600 mg BID -Flonase  Hypokalemia: Potassium 3.6 this morning.  -Repeat tomorrow am  T2DM: Patient is on metformin 1000 mg BID at home.  -SSI-S -Holding home metformin  HTN: Normotensive. Patient is on amlodipine 5 mg QD and lisinopril at home. Due to concerns of dry hacking, pharmacy switched her lisinopril to losartan 25 mg QD. -Continue above medications  DVT/PE ppx: Lovenox SQ QD CODE: FULL FEN: HH  Dispo: Anticipated discharge in approximately 1 day(s).   LOS: 3 days   Martyn Malay, DO PGY-3 Internal Medicine Resident Pager # [phone] 06/08/2016 10:14 AM

## 2016-06-09 ENCOUNTER — Other Ambulatory Visit: Payer: Self-pay | Admitting: Internal Medicine

## 2016-06-09 LAB — COMPREHENSIVE METABOLIC PANEL
ALK PHOS: 125 U/L (ref 38–126)
ALT: 156 U/L — ABNORMAL HIGH (ref 14–54)
AST: 95 U/L — ABNORMAL HIGH (ref 15–41)
Albumin: 3.1 g/dL — ABNORMAL LOW (ref 3.5–5.0)
Anion gap: 10 (ref 5–15)
BUN: 10 mg/dL (ref 6–20)
CALCIUM: 9 mg/dL (ref 8.9–10.3)
CO2: 29 mmol/L (ref 22–32)
Chloride: 104 mmol/L (ref 101–111)
Creatinine, Ser: 1.14 mg/dL — ABNORMAL HIGH (ref 0.44–1.00)
GFR calc Af Amer: 59 mL/min — ABNORMAL LOW (ref >60)
GFR calc non Af Amer: 51 mL/min — ABNORMAL LOW (ref >60)
Glucose, Bld: 104 mg/dL — ABNORMAL HIGH (ref 65–99)
Potassium: 3.5 mmol/L (ref 3.5–5.1)
Sodium: 143 mmol/L (ref 135–145)
Total Bilirubin: 0.8 mg/dL (ref 0.3–1.2)
Total Protein: 6.9 g/dL (ref 6.5–8.1)

## 2016-06-09 LAB — CBC
HCT: 32.6 % — ABNORMAL LOW (ref 36.0–46.0)
HEMOGLOBIN: 10.2 g/dL — AB (ref 12.0–15.0)
MCH: 24.7 pg — ABNORMAL LOW (ref 26.0–34.0)
MCHC: 31.3 g/dL (ref 30.0–36.0)
MCV: 78.9 fL (ref 78.0–100.0)
Platelets: 269 10*3/uL (ref 150–400)
RBC: 4.13 MIL/uL (ref 3.87–5.11)
RDW: 17 % — ABNORMAL HIGH (ref 11.5–15.5)
WBC: 9.6 10*3/uL (ref 4.0–10.5)

## 2016-06-09 LAB — GLUCOSE, CAPILLARY
GLUCOSE-CAPILLARY: 112 mg/dL — AB (ref 65–99)
Glucose-Capillary: 87 mg/dL (ref 65–99)
Glucose-Capillary: 122 mg/dL — ABNORMAL HIGH (ref 65–99)

## 2016-06-09 MED ORDER — LOSARTAN POTASSIUM 25 MG PO TABS: 25.0000 mg | ORAL_TABLET | Freq: Every day | ORAL | 0 refills | Status: DC

## 2016-06-09 NOTE — Discharge Summary (Signed)
Name: Kristen Frost MRN: [id number] DOB: October 10, 1955 61 y.o. PCP: Holley Raring, MD  Date of Admission: 06/05/2016  8:35 AM Date of Discharge: 06/09/2016 Attending Physician: Bartholomew Crews, MD  Discharge Diagnosis: Principal Problem:   Symptomatic cholelithiasis Active Problems:   HTN (hypertension)   T2DM (type 2 diabetes mellitus) ([place])   Hypokalemia   Discharge Medications: Allergies as of 06/09/2016      Reactions   No Known Allergies       Medication List    STOP taking these medications   lisinopril 5 MG tablet Commonly known as:  PRINIVIL,ZESTRIL   rOPINIRole 0.5 MG tablet Commonly known as:  REQUIP     TAKE these medications   amLODipine 5 MG tablet Commonly known as:  NORVASC Take 1 tablet (5 mg total) by mouth daily.   aspirin EC 81 MG tablet Take 1 tablet (81 mg total) by mouth daily.   atorvastatin 40 MG tablet Commonly known as:  LIPITOR Take 1 tablet (40 mg total) by mouth daily.   ferrous sulfate 325 (65 FE) MG tablet Take 1 tablet (325 mg total) by mouth daily with breakfast.   fluticasone 50 MCG/ACT nasal spray Commonly known as:  FLONASE Place 1 spray into both nostrils daily.   losartan 25 MG tablet Commonly known as:  COZAAR Take 1 tablet (25 mg total) by mouth daily.   metFORMIN 500 MG tablet Commonly known as:  GLUCOPHAGE TAKE 2 TABLETS BY MOUTH TWICE DAILY WITH MEAL   Vitamin D 2000 units Caps Take 1 capsule (2,000 Units total) by mouth daily.       Disposition and follow-up:   Ms.Coreen D Devora was discharged from [facility] in Good condition.  At the hospital follow up visit please address:  1.   S/p Lap chole: Abdominal pain resolved after cholecystectomy, please assess for further pain.  HTN: Stopped lisinopril due to dry cough. Started losartan 25 mg daily. Continued amlodipine 5 mg daily.  2.  Labs / imaging needed at time of follow-up: N/A  3.  Pending labs/ test needing follow-up:  N/A  Follow-up Appointments: Follow-up [facility] Follow up.   Specialty:  General Surgery Why:  Our office is scheduling you a post-operative follow up appointment in 2-3 weeks. call to confirm appointment date/time. Contact information: [place] [phone]       Noah Delaine, MD Follow up on 06/22/2016.   Specialty:  Internal Medicine Why:  10:15 am Contact information: [place] [id number] [address] Hospital Course by problem list: Principal Problem:   Symptomatic cholelithiasis Active Problems:   HTN (hypertension)   T2DM (type 2 diabetes mellitus) ([place])   Hypokalemia   Kristen Frost is a 61 y.o female with PMHx HTN, T2DM, morbid obesity, arthritis, vitamin D deficiency, OSA, restless leg syndrome, who presented with RUQ pain.  Symptomatic Cholelithiasis: ED Course: Patient was afebrile, non-tachycardic, non-tachypneic, hypertensive 156/80, satting well on room air. Normal WBC 8.3. Transaminitis with AST 204, ALT 104, normal AP 90, normal total bili 1.0. Elevated Cr 1.07 but at baseline over the past 6 months. Lipase 19. UA unremarkable. Patient had limited abdominal U/S which showed cholelithiasis without sonographic evidence of acute cholecystitis, as well as a mildly dilated common bile duct measuring 00.1 mm of uncertain etiology without choledocholithiasis or obstructing mass identified. MRCP  showed distended gallbladder with tiny gallstones without other definite signs of acute cholecystitis; some mild diffuse biliary ductal dilatation, but not choledocholithiasis. Patient was admitted 06/05/2016 for symptomatic cholelithiasis warranting cholecystectomy.  During admission, patient was made NPO with adequate pain and nausea control and IVFs. Underwent Cholecystectomy without cholangiogram on 06/07/2016 without any complications. After surgery,  patient's RUQ pain improved with adequate pain regimen, transitioning from IV to PO pain meds. Patient's lap chole sites were clean, intact, closed, without drainage or surrounding erythema or warmth. Patient was able to advance from a clear liquid diet to a regular diet. Patient had bowel movement and passed gas. At discharge, patient was hemodynamically stable and satting well on room air. No indication for abx therapy throughout admission or at discharge.  HTN: Outside of her surgery, patient's HTN med regimen was adjusted. Patient reported new intermittent dry coughing, just started lisinopril a few weeks ago. Lisinopril changed to losartan 25 mg daily, continued home amlodipine. Patient was discharged on 06/09/2016 with follow-up appointments set with PCP and general surgery.  Discharge Vitals:   BP 127/64 (BP Location: Right Arm)   Pulse 69   Temp 98.2 F (36.8 C) (Oral)   Resp 18   Ht 5' 1.5" (1.562 m)   Wt 259 lb 14.8 oz (117.9 kg)   SpO2 95%   BMI 48.32 kg/m    Pertinent Labs, Studies, and Procedures:    Ref Range & Units 06/06/2016   Sodium 135 - 145 mmol/L 140   Potassium 3.5 - 5.1 mmol/L 3.3    Chloride 101 - 111 mmol/L 107   CO2 22 - 32 mmol/L 25   Glucose, Bld 65 - 99 mg/dL 115    BUN 6 - 20 mg/dL 7   Creatinine, Ser 0.44 - 1.00 mg/dL 1.03    Calcium 8.9 - 10.3 mg/dL 8.2    Total Protein 6.5 - 8.1 g/dL 6.1    Albumin 3.5 - 5.0 g/dL 3.0    AST 15 - 41 U/L 391    ALT 14 - 54 U/L 283    Alkaline Phosphatase 38 - 126 U/L 104   Total Bilirubin 0.3 - 1.2 mg/dL 2.8    GFR calc non Af Amer >60 mL/min 58    GFR calc Af Amer >60 mL/min >60       CLINICAL DATA:  Right upper quadrant abdominal pain, vomiting, and diarrhea for several days. Cholelithiasis and biliary ductal dilatation seen on recent ultrasound.  EXAM: MRI ABDOMEN WITHOUT AND WITH CONTRAST (INCLUDING MRCP)  TECHNIQUE: Multiplanar multisequence MR imaging of the abdomen was performed both before and  after the administration of intravenous contrast. Heavily T2-weighted images of the biliary and pancreatic ducts were obtained, and three-dimensional MRCP images were rendered by post processing.  CONTRAST:  78mL MULTIHANCE GADOBENATE DIMEGLUMINE 529 MG/ML IV SOLN  COMPARISON:  Ultrasound on 06/05/2016  FINDINGS: Lower chest: Bibasilar atelectasis noted.  Hepatobiliary: No masses identified. Mild diffuse hepatic steatosis. Gallbladder is mildly distended with several tiny gallstones in the gallbladder neck, however there is no evidence of significant gallbladder wall thickening or pericholecystic inflammatory changes.  Mild diffuse biliary ductal dilatation is seen, with common bile duct measuring 12 mm. Smooth tapering of the distal common bile duct is seen, without evidence of choledocholithiasis.  Pancreas: No mass or inflammatory changes. No evidence of pancreatic ductal dilatation or pancreas divisum.  Spleen:  Within normal limits in size and appearance.  Adrenals/Urinary Tract: No masses identified. No evidence of hydronephrosis.  Stomach/Bowel:  Small hiatal hernia.  Otherwise unremarkable.  Vascular/Lymphatic: No pathologically enlarged lymph nodes identified. No abdominal aortic aneurysm.  Other:  None.  Musculoskeletal:  No suspicious bone lesions identified.  IMPRESSION: Distended gallbladder with tiny gallstones. No other definite signs of acute cholecystitis.  Mild diffuse biliary ductal dilatation, with common bile duct measuring 12 mm. No choledocholithiasis or other obstructing etiology visualized.  Mild diffuse hepatic steatosis.  No liver mass identified.  Small hiatal hernia.  Bibasilar atelectasis.   Electronically Signed   By: Earle Gell M.D.   On: 06/05/2016 16:35  Discharge Instructions: Discharge Instructions    Call MD for:  difficulty breathing, headache or visual disturbances    Complete by:  As directed     Call MD for:  hives    Complete by:  As directed    Call MD for:  persistant dizziness or light-headedness    Complete by:  As directed    Call MD for:  persistant nausea and vomiting    Complete by:  As directed    Call MD for:  redness, tenderness, or signs of infection (pain, swelling, redness, odor or green/yellow discharge around incision site)    Complete by:  As directed    Call MD for:  severe uncontrolled pain    Complete by:  As directed    Call MD for:  temperature >100.4    Complete by:  As directed    Diet - low sodium heart healthy    Complete by:  As directed    Discharge instructions    Complete by:  As directed    Please assess for further abdominal pain. Her home Lisinopril was changed to Losartan 25 mg daily due to dry cough, please reassess for continued cough and blood pressure control.   Increase activity slowly    Complete by:  As directed       Signed: Zada Finders, MD 06/09/2016, 11:14 AM

## 2016-06-09 NOTE — Progress Notes (Signed)
Kristen Frost discharged Home with husband per MD order.  Discharge instructions reviewed and discussed with the patient, all questions and concerns answered. Copy of instructions and care notes given to patient, prescriptions sent to pt. Pharmacy.  Allergies as of 06/09/2016      Reactions   No Known Allergies       Medication List    STOP taking these medications   lisinopril 5 MG tablet Commonly known as:  PRINIVIL,ZESTRIL   rOPINIRole 0.5 MG tablet Commonly known as:  REQUIP     TAKE these medications   amLODipine 5 MG tablet Commonly known as:  NORVASC Take 1 tablet (5 mg total) by mouth daily.   aspirin EC 81 MG tablet Take 1 tablet (81 mg total) by mouth daily.   atorvastatin 40 MG tablet Commonly known as:  LIPITOR Take 1 tablet (40 mg total) by mouth daily.   ferrous sulfate 325 (65 FE) MG tablet Take 1 tablet (325 mg total) by mouth daily with breakfast.   fluticasone 50 MCG/ACT nasal spray Commonly known as:  FLONASE Place 1 spray into both nostrils daily.   losartan 25 MG tablet Commonly known as:  COZAAR Take 1 tablet (25 mg total) by mouth daily.   metFORMIN 500 MG tablet Commonly known as:  GLUCOPHAGE TAKE 2 TABLETS BY MOUTH TWICE DAILY WITH MEAL   Vitamin D 2000 units Caps Take 1 capsule (2,000 Units total) by mouth daily.       Patients skin is clean, dry and intact, no evidence of skin break down.  Surgical incisions CDI. IV site discontinued and catheter remains intact. Site without signs and symptoms of complications. Dressing and pressure applied.  Patient escorted to car by NT in a wheelchair,  no distress noted upon discharge.  Wynetta Emery, Nekeisha Aure C 06/09/2016 6:59 PM

## 2016-06-09 NOTE — Progress Notes (Signed)
   Subjective: Patient is feeling well this morning. Her presenting abdominal pain has resolved. She is eating without nausea or vomiting. She denies any fevers or chills. She had bowel movements yesterday.  Objective: Vital signs in last 24 hours: Vitals:   06/08/16 1456 06/08/16 2256 06/09/16 0228 06/09/16 0618  BP: 120/66   (!) 94/48  Pulse: 94 89  70  Resp: 18 18  18   Temp: 98.3 F (36.8 C)   98.2 F (36.8 C)  TempSrc: Oral   Oral  SpO2: 98% 99%  95%  Weight:   259 lb 14.8 oz (117.9 kg)   Height:       Physical Exam General: resting in bed, no acute distress Cardiac: RRR, no rubs, murmurs or gallops Pulm: clear to auscultation bilaterally, moving normal volumes of air Abd: soft obese abdomen, nontender, nondistended, BS present, surgical incisions C/D/I Ext: warm and well perfused, no pedal edema Neuro: alert and oriented X3   Assessment/Plan: Principal Problem:   Symptomatic cholelithiasis Active Problems:   HTN (hypertension)   T2DM (type 2 diabetes mellitus) (HCC)   Hypokalemia  Ms. Ogden is a 25 F with PMHx of T2DM, HTN, suspected OSA who presented on 4/17 with complaint of abdominal pain and found to have symptomatic cholelithiasis.  Symptomatic Cholelithiasis s/p Lap Chole on 4/19: Patient is feeling well. She is tolerating her diet and had bowel movements yesterday. Pain is controlled. No fever, chills, nausea, or vomiting. Incisions are C/D/I.  -Tylenol prn -Follow up outpatient with general surgery -Anticipate d/c today with [facility] follow up outpatient  Allergic Rhinitis: -Claritin QD -Mucinex 600 mg BID -Flonase  Hypokalemia: Repleted, Potassium 3.5 this morning.  T2DM:  -SSI-S -Continue home metformin on discharge  HTN: Normotensive. Patient is on amlodipine 5 mg QD and lisinopril at home. Due to concerns of dry hacking, pharmacy switched her lisinopril to losartan 25 mg QD. -Continue above medications  Dispo: Anticipated discharge today.   LOS: 4 days   Zada Finders, MD Internal Medicine PGY-2 06/09/2016 8:56 AM

## 2016-06-11 NOTE — Telephone Encounter (Signed)
Results have been explained to patient, pt expressed understanding.  Order placed for CPAP to DME  [facility] Appt scheduled with Dr Halford Chessman on  July 9th at 10am. Nothing further needed.

## 2016-06-11 NOTE — Anesthesia Postprocedure Evaluation (Signed)
Anesthesia Post Note  Patient: Kristen Frost  Procedure(s) Performed: Procedure(s) (LRB): LAPAROSCOPIC CHOLECYSTECTOMY (N/A)  Patient location during evaluation: PACU Anesthesia Type: General Level of consciousness: awake Pain management: pain level controlled Vital Signs Assessment: post-procedure vital signs reviewed and stable Cardiovascular status: stable Anesthetic complications: no       Last Vitals:  Vitals:   06/09/16 1108 06/09/16 1514  BP: 127/64 117/61  Pulse: 69 79  Resp:  18  Temp:  36.6 C    Last Pain:  Vitals:   06/09/16 1514  TempSrc: Oral  PainSc:                  Carolene Gitto

## 2016-06-17 ENCOUNTER — Other Ambulatory Visit: Payer: Self-pay | Admitting: Internal Medicine

## 2016-06-22 ENCOUNTER — Ambulatory Visit (INDEPENDENT_AMBULATORY_CARE_PROVIDER_SITE_OTHER): Payer: Medicaid Other | Admitting: Internal Medicine

## 2016-06-22 VITALS — BP 133/76 | HR 87 | Temp 98.1°F | Ht 61.0 in | Wt 251.7 lb

## 2016-06-22 DIAGNOSIS — Z8719 Personal history of other diseases of the digestive system: Secondary | ICD-10-CM

## 2016-06-22 DIAGNOSIS — Z9049 Acquired absence of other specified parts of digestive tract: Secondary | ICD-10-CM | POA: Diagnosis not present

## 2016-06-22 DIAGNOSIS — I10 Essential (primary) hypertension: Secondary | ICD-10-CM

## 2016-06-22 DIAGNOSIS — Z09 Encounter for follow-up examination after completed treatment for conditions other than malignant neoplasm: Secondary | ICD-10-CM

## 2016-06-22 DIAGNOSIS — K8020 Calculus of gallbladder without cholecystitis without obstruction: Secondary | ICD-10-CM

## 2016-06-22 NOTE — Progress Notes (Signed)
   CC: hospital follow up for symptomatic cholelithiasis  HPI:  Kristen Frost is a 61 y.o. with PMH as listed below is here for hospital follow up for symptomatic cholelithiasis.   She was admitted 4/17 to 4/21 for symptomatic cholelithiasis, s/p Lap cholecystectomy 06/07/16 without any complications. Her RUQ pain improved after the surgery.  Doing well today, no abd pain, no fevers, dysuria, cough, sob, eating and drinking fine. Has appt with surgery clinic next week.   HTN - lisinopril was stopped due to cough and was started on losartan 25mg  daily along with amlodipine 5mg  daily.    Past Medical History:  Diagnosis Date  . Arthritis   . Hypertension   . Restless leg syndrome   . Sleep apnea       Review of Systems:   Review of Systems  Constitutional: Negative for chills and fever.  Respiratory: Negative for cough, sputum production and shortness of breath.   Cardiovascular: Negative for chest pain and palpitations.  Gastrointestinal: Negative for abdominal pain, heartburn, nausea and vomiting.     Physical Exam:  Vitals:   06/22/16 1001  BP: 133/76  Pulse: 87  Temp: 98.1 F (36.7 C)  TempSrc: Oral  SpO2: 100%  Weight: 251 lb 11.2 oz (114.2 kg)  Height: 5\' 1"  (1.549 m)   Physical Exam  Constitutional: She is oriented to person, place, and time. She appears well-developed and well-nourished. No distress.  Overweight female.   Cardiovascular: Exam reveals no gallop and no friction rub.   No murmur heard. Respiratory: Effort normal and breath sounds normal.  GI:  surgical incision sites from laparoscopic cholecystectomy are C/D/I. No abdominal pain or tenderness, soft abdomen.   Musculoskeletal: Normal range of motion. She exhibits no edema.  Neurological: She is alert and oriented to person, place, and time.  Skin: She is not diaphoretic.  Psychiatric: She has a normal mood and affect.    Assessment & Plan:   See Encounters Tab for problem based  charting.  Patient discussed with Dr. Beryle Beams

## 2016-06-22 NOTE — Progress Notes (Signed)
Medicine attending: Medical history, presenting problems, physical findings, and medications, reviewed with resident physician Dr Tasrif Ahmed on the day of the patient visit and I concur with his evaluation and management plan. 

## 2016-06-22 NOTE — Assessment & Plan Note (Signed)
lisinopril was stopped due to cough and was started on losartan 25mg  daily along with amlodipine 5mg  daily.  Vitals:   06/22/16 1001  BP: 133/76  Pulse: 87  Temp: 98.1 F (36.7 C)   BP well controlled, continue current regimen.

## 2016-06-22 NOTE — Assessment & Plan Note (Signed)
s/p lap chole 4/19. Doing well, no symptoms today.  Has f/up with surgery clinic next week.  Resolved.

## 2016-06-22 NOTE — Patient Instructions (Signed)
I am glad you are doing well.  Keep your appt with the surgeons and the primary doctor.  Continue current medications.

## 2016-06-30 ENCOUNTER — Other Ambulatory Visit: Payer: Self-pay | Admitting: Internal Medicine

## 2016-07-06 NOTE — Telephone Encounter (Signed)
F/u w/ dr Genene Churn

## 2016-07-24 ENCOUNTER — Other Ambulatory Visit: Payer: Self-pay | Admitting: Internal Medicine

## 2016-07-27 ENCOUNTER — Encounter: Payer: Self-pay | Admitting: *Deleted

## 2016-07-29 ENCOUNTER — Other Ambulatory Visit: Payer: Self-pay | Admitting: Internal Medicine

## 2016-08-09 ENCOUNTER — Ambulatory Visit (INDEPENDENT_AMBULATORY_CARE_PROVIDER_SITE_OTHER): Payer: Medicaid Other | Admitting: Internal Medicine

## 2016-08-09 ENCOUNTER — Encounter: Payer: Self-pay | Admitting: Internal Medicine

## 2016-08-09 VITALS — BP 124/73 | HR 103 | Temp 98.0°F | Ht 61.0 in | Wt 250.0 lb

## 2016-08-09 DIAGNOSIS — Z6841 Body Mass Index (BMI) 40.0 and over, adult: Secondary | ICD-10-CM

## 2016-08-09 DIAGNOSIS — Z79899 Other long term (current) drug therapy: Secondary | ICD-10-CM | POA: Diagnosis not present

## 2016-08-09 DIAGNOSIS — G4733 Obstructive sleep apnea (adult) (pediatric): Secondary | ICD-10-CM

## 2016-08-09 DIAGNOSIS — E1122 Type 2 diabetes mellitus with diabetic chronic kidney disease: Secondary | ICD-10-CM | POA: Diagnosis not present

## 2016-08-09 DIAGNOSIS — Z840 Family history of diseases of the skin and subcutaneous tissue: Secondary | ICD-10-CM

## 2016-08-09 DIAGNOSIS — I129 Hypertensive chronic kidney disease with stage 1 through stage 4 chronic kidney disease, or unspecified chronic kidney disease: Secondary | ICD-10-CM

## 2016-08-09 DIAGNOSIS — Z8489 Family history of other specified conditions: Secondary | ICD-10-CM | POA: Diagnosis not present

## 2016-08-09 DIAGNOSIS — Z8249 Family history of ischemic heart disease and other diseases of the circulatory system: Secondary | ICD-10-CM

## 2016-08-09 DIAGNOSIS — Z8269 Family history of other diseases of the musculoskeletal system and connective tissue: Secondary | ICD-10-CM | POA: Diagnosis not present

## 2016-08-09 DIAGNOSIS — E785 Hyperlipidemia, unspecified: Secondary | ICD-10-CM | POA: Diagnosis not present

## 2016-08-09 DIAGNOSIS — Z825 Family history of asthma and other chronic lower respiratory diseases: Secondary | ICD-10-CM

## 2016-08-09 DIAGNOSIS — Z7984 Long term (current) use of oral hypoglycemic drugs: Secondary | ICD-10-CM | POA: Diagnosis not present

## 2016-08-09 DIAGNOSIS — N182 Chronic kidney disease, stage 2 (mild): Secondary | ICD-10-CM

## 2016-08-09 DIAGNOSIS — Z820 Family history of epilepsy and other diseases of the nervous system: Secondary | ICD-10-CM

## 2016-08-09 DIAGNOSIS — I10 Essential (primary) hypertension: Secondary | ICD-10-CM

## 2016-08-09 DIAGNOSIS — Z9989 Dependence on other enabling machines and devices: Secondary | ICD-10-CM

## 2016-08-09 DIAGNOSIS — Z836 Family history of other diseases of the respiratory system: Secondary | ICD-10-CM

## 2016-08-09 DIAGNOSIS — E119 Type 2 diabetes mellitus without complications: Secondary | ICD-10-CM

## 2016-08-09 LAB — POCT GLYCOSYLATED HEMOGLOBIN (HGB A1C): Hemoglobin A1C: 6.1

## 2016-08-09 LAB — GLUCOSE, CAPILLARY: GLUCOSE-CAPILLARY: 117 mg/dL — AB (ref 65–99)

## 2016-08-09 NOTE — Assessment & Plan Note (Signed)
Likely secondary to hypertension and diabetes. Currently controlled by addressing the underlying issues. Creatinine stable over the last several months.  Assessment: Stable kidney disease stage II  Plan: Continue management of diabetes and hypertension as above.

## 2016-08-09 NOTE — Assessment & Plan Note (Signed)
Patient has follow-up with Dr. Halford Chessman who has started her on CPAP. She is tolerating this very well and feels significantly improved in terms of her fatigue and overall well-being since starting this therapy. Patient is compliant and tolerates this mass likely better than she did her previous trial of CPAP many years ago.  Assessment: OSA, currently controlled on CPAP, follow with Dr. suit.  Plan: Continue CPAP

## 2016-08-09 NOTE — Patient Instructions (Signed)
I am so pleased to have seen you today and an very encouraged by your progress with your weight and blood pressure. Continue the great work.  We do not need to make any changes to your medications today. You can meet your new doctor at your next visit in 3 months.

## 2016-08-09 NOTE — Assessment & Plan Note (Signed)
Patient's hypertension is well controlled today with 120s over 72s. She reports she is compliant with her medications and has not had issues with cough since transitioning from lisinopril to losartan. Patient denies any chest pain, shortness of breath, headaches.  Assessment: Well controlled essential hypertension  Plan: Continue losartan 25 mg daily and amlodipine 5 mg daily. Continue lisinopril and dietary modifications.

## 2016-08-09 NOTE — Assessment & Plan Note (Addendum)
Patient reports continued dietary modification and exercise routine with weight loss now 2 pounds down from her last visit 6 weeks ago. Patient feels very good about this weight loss and lifestyle changes. She feels that she has control of her health again and this is encouraging. Patient continues to do very well in terms of her hyperglycemia with A1c of 6.1 today now on metformin 1000 mg twice daily. Patient denies any polydipsia or polyuria.  Assessment: Well-controlled type 2 diabetes mellitus  Plan: Continue metformin and lifestyle modifications. Patient should have a baseline diabetic eye screening which can be done either by ophthalmology, optometry or with retinal photographs in our clinic at her next visit.

## 2016-08-09 NOTE — Progress Notes (Signed)
CC: Diabetes, hypertension, who has a follow-up HPI: [name] is a 61 y.o. female with a h/o of ASCVD risk and hyperlipidemia, hypertension, type 2 diabetes, OSA, morbid obesity, CK D stage II who presents for routine follow-up of the above.  Please see Problem-based charting for HPI and the status of patient's chronic medical conditions.  Past Medical History:  Diagnosis Date  . Arthritis   . Hypertension   . Restless leg syndrome   . Sleep apnea    Social History  Substance Use Topics  . Smoking status: Never Smoker  . Smokeless tobacco: Never Used  . Alcohol use No   Family History  Problem Relation Age of Onset  . Allergies Father   . Alcohol abuse Father   . Tuberculosis Father        deceased from TB  . Allergies Other        sibling  . Asthma Other        sibling  . Hypertension Other        sibling  . Migraines Other        sibling  . Rashes / Skin problems Other        sibling  . Gout Other        sibling   Review of Systems: ROS in HPI. Otherwise: Review of Systems  Constitutional: Negative for chills, fever and weight loss.  Respiratory: Negative for cough and shortness of breath.   Cardiovascular: Negative for chest pain and leg swelling.  Gastrointestinal: Negative for abdominal pain, constipation, diarrhea, nausea and vomiting.  Genitourinary: Negative for dysuria, frequency and urgency.   Physical Exam: Vitals:   08/09/16 1348  BP: 124/73  Pulse: (!) 103  Temp: 98 F (36.7 C)  TempSrc: Oral  SpO2: 100%  Weight: 250 lb (113.4 kg)  Height: 5\' 1"  (1.549 m)   Physical Exam  Constitutional: She appears well-developed. She is cooperative. No distress.  Obese well appearing female in NAD.  Cardiovascular: Normal rate, regular rhythm, normal heart sounds and normal pulses.  Exam reveals no gallop.   No murmur heard. Pulmonary/Chest: Effort normal and breath sounds normal. No respiratory distress. She has no wheezes. She has no  rhonchi. She has no rales. Breasts are symmetrical.  Abdominal: Soft. Bowel sounds are normal. There is no tenderness.  Musculoskeletal: She exhibits no edema.    Assessment & Plan:  See encounters tab for problem based medical decision making. Patient discussed with Dr. Daryll Drown  CKD (chronic kidney disease) stage 2, GFR 60-89 ml/min Likely secondary to hypertension and diabetes. Currently controlled by addressing the underlying issues. Creatinine stable over the last several months.  Assessment: Stable kidney disease stage II  Plan: Continue management of diabetes and hypertension as above.  HTN (hypertension) Patient's hypertension is well controlled today with 120s over 79s. She reports she is compliant with her medications and has not had issues with cough since transitioning from lisinopril to losartan. Patient denies any chest pain, shortness of breath, headaches.  Assessment: Well controlled essential hypertension  Plan: Continue losartan 25 mg daily and amlodipine 5 mg daily. Continue lisinopril and dietary modifications.  OSA (obstructive sleep apnea) Patient has follow-up with Dr. Halford Chessman who has started her on CPAP. She is tolerating this very well and feels significantly improved in terms of her fatigue and overall well-being since starting this therapy. Patient is compliant and tolerates this mass likely better than she did her previous trial of CPAP many years ago.  Assessment: OSA, currently controlled on CPAP, follow with Dr. suit.  Plan: Continue CPAP  T2DM (type 2 diabetes mellitus) ([place]) Patient reports continued dietary modification and exercise routine with weight loss now 2 pounds down from her last visit 6 weeks ago. Patient feels very good about this weight loss and lifestyle changes. She feels that she has control of her health again and this is encouraging. Patient continues to do very well in terms of her hyperglycemia with A1c of 6.1 today now on metformin  1000 mg twice daily. Patient denies any polydipsia or polyuria.  Assessment: Well-controlled type 2 diabetes mellitus  Plan: Continue metformin and lifestyle modifications. Patient should have a baseline diabetic eye screening which can be done either by ophthalmology, optometry or with retinal photographs in our clinic at her next visit.   Signed: Holley Raring, MD 08/09/2016, 2:46 PM  Pager: [phone]

## 2016-08-09 NOTE — Progress Notes (Deleted)
   CC: *** HPI: [name] is a 61 y.o. female with a h/o of *** who presents ***  Please see Problem-based charting for HPI and the status of patient's chronic medical conditions.  Past Medical History:  Diagnosis Date  . Arthritis   . Hypertension   . Restless leg syndrome   . Sleep apnea    Social History  Substance Use Topics  . Smoking status: Never Smoker  . Smokeless tobacco: Never Used  . Alcohol use No   Family History  Problem Relation Age of Onset  . Allergies Father   . Alcohol abuse Father   . Tuberculosis Father        deceased from TB  . Allergies Other        sibling  . Asthma Other        sibling  . Hypertension Other        sibling  . Migraines Other        sibling  . Rashes / Skin problems Other        sibling  . Gout Other        sibling   Review of Systems: ROS in HPI. Otherwise: ROS Physical Exam: Vitals:   08/09/16 1348  BP: 124/73  Pulse: (!) 103  Temp: 98 F (36.7 C)  TempSrc: Oral  SpO2: 100%  Weight: 250 lb (113.4 kg)  Height: 5\' 1"  (1.549 m)   Physical Exam  Assessment & Plan:  See encounters tab for problem based medical decision making. Patient {GC/GE:3044014::"discussed with","seen with"} Dr. {NAMES:3044014::"Butcher","Granfortuna","E. Hoffman","Klima","Mullen","Narendra","Vincent"}  No problem-specific Assessment & Plan notes found for this encounter.   Signed: Holley Raring, MD 08/09/2016, 2:15 PM  Pager: [phone]

## 2016-08-12 NOTE — Progress Notes (Signed)
[facility] Attending  Case discussed with Dr. Strelow at the time of the visit.  We reviewed the resident's history and exam and pertinent patient test results.  I agree with the assessment, diagnosis, and plan of care documented in the resident's note.  

## 2016-08-27 ENCOUNTER — Ambulatory Visit: Payer: Medicaid Other | Admitting: Pulmonary Disease

## 2016-09-03 ENCOUNTER — Encounter: Payer: Self-pay | Admitting: Pulmonary Disease

## 2016-09-03 ENCOUNTER — Ambulatory Visit (INDEPENDENT_AMBULATORY_CARE_PROVIDER_SITE_OTHER): Payer: Medicaid Other | Admitting: Pulmonary Disease

## 2016-09-03 VITALS — BP 132/80 | HR 69 | Ht 61.5 in | Wt 241.1 lb

## 2016-09-03 DIAGNOSIS — Z6841 Body Mass Index (BMI) 40.0 and over, adult: Secondary | ICD-10-CM

## 2016-09-03 DIAGNOSIS — G4733 Obstructive sleep apnea (adult) (pediatric): Secondary | ICD-10-CM

## 2016-09-03 NOTE — Patient Instructions (Signed)
Follow up in 1 year.

## 2016-09-03 NOTE — Progress Notes (Signed)
Current Outpatient Prescriptions on File Prior to Visit  Medication Sig  . amLODipine (NORVASC) 5 MG tablet Take 1 tablet (5 mg total) by mouth daily.  Marland Kitchen aspirin EC 81 MG tablet Take 1 tablet (81 mg total) by mouth daily.  Marland Kitchen atorvastatin (LIPITOR) 40 MG tablet Take 1 tablet (40 mg total) by mouth daily.  . Cholecalciferol (VITAMIN D) 2000 units CAPS Take 1 capsule (2,000 Units total) by mouth daily.  . ferrous sulfate 325 (65 FE) MG tablet Take 1 tablet (325 mg total) by mouth daily with breakfast.  . fluticasone (FLONASE) 50 MCG/ACT nasal spray USE 1 SPRAY IN EACH NOSTRIL DAILY  . losartan (COZAAR) 25 MG tablet TAKE 1 TABLET BY MOUTH EVERY DAY  . metFORMIN (GLUCOPHAGE) 1000 MG tablet TAKE 1 TABLET BY MOUTH TWICE DAILY WITH A MEAL.   No current facility-administered medications on file prior to visit.      Chief Complaint  Patient presents with  . Follow-up    Wears CPAP nightly. Pt states that her mask feels to be leaking air at night. [facility]: [facility]      Sleep tests PSG 08/20/09 >> AHI 26 PSG 04/25/16 >> AHI 16.4, SpO2 low 88%.  CPAP 9 cm H2O Auto CPAP 08/04/16 to 09/02/16 >> used on 30 of 30 nights with average 7 hrs 21 min.  Average AHI 0.6 with median CPAP 12 and 95 th percentile CPAP 14 cm H2O  Past medical history Arthritis, RLS  Past surgical history, Family history, Social history, Allergies all reviewed.  Vital Signs BP 132/80 (BP Location: Right Arm, Cuff Size: Normal)   Pulse 69   Ht 5' 1.5" (1.562 m)   Wt 241 lb 1.6 oz (109.4 kg)   SpO2 98%   BMI 44.82 kg/m   History of Present Illness Kristen Frost is a 61 y.o. female with obstructive sleep apnea.  Since her last visit she had repeat sleep study.  This showed moderate OSA.  She was restarted on CPAP.  She has full face mask.  Her mask leaks occasionally when she rolls.  Otherwise she is sleeping better, and feels more energy.  She is exercising more and changed her diet.  She is optimistic that she will be able  to lose weight.  Physical Exam  General - pleasant Eyes - pupils reactive ENT - no sinus tenderness, no oral exudate, no LAN, MP 4, wears dentures Cardiac - regular, no murmur Chest - no wheeze, rales Abd - soft, non tender Ext - no edema Skin - no rashes Neuro - normal strength Psych - normal mood   Assessment/Plan  Obstructive sleep apnea. - she is compliant with CPAP and reports benefit from therapy - continue auto CPAP  Obesity. - encouraged her to keep up with her weight loss efforts   Patient Instructions  Follow up in 1 year    Chesley Mires, MD [place] Pager:  [phone] 09/03/2016, 10:30 AM

## 2016-10-23 ENCOUNTER — Ambulatory Visit (INDEPENDENT_AMBULATORY_CARE_PROVIDER_SITE_OTHER): Payer: Medicaid Other | Admitting: Internal Medicine

## 2016-10-23 VITALS — BP 145/69 | HR 74 | Temp 97.6°F | Wt 245.0 lb

## 2016-10-23 DIAGNOSIS — S46812A Strain of other muscles, fascia and tendons at shoulder and upper arm level, left arm, initial encounter: Secondary | ICD-10-CM | POA: Diagnosis not present

## 2016-10-23 DIAGNOSIS — X58XXXA Exposure to other specified factors, initial encounter: Secondary | ICD-10-CM

## 2016-10-23 DIAGNOSIS — S46819A Strain of other muscles, fascia and tendons at shoulder and upper arm level, unspecified arm, initial encounter: Secondary | ICD-10-CM | POA: Insufficient documentation

## 2016-10-23 MED ORDER — CYCLOBENZAPRINE HCL 5 MG PO TABS: 5.0000 mg | ORAL_TABLET | Freq: Three times a day (TID) | ORAL | Status: DC | PRN

## 2016-10-23 MED ORDER — MENTHOL (TOPICAL ANALGESIC) 5 % EX GEL: 4.0000 mL | Freq: Three times a day (TID) | CUTANEOUS | 0 refills | Status: AC

## 2016-10-23 MED ORDER — CYCLOBENZAPRINE HCL 5 MG PO TABS: 5.0000 mg | ORAL_TABLET | Freq: Three times a day (TID) | ORAL | 0 refills | Status: AC | PRN

## 2016-10-23 NOTE — Assessment & Plan Note (Signed)
Pt has increased left trapezius pain radiating to left shoulder and scapula, a palpable muscular knot is felt on the left trapezius and is tender to palpation.  I feel that the shoulder is not involved as pt is neer's, hawkins, obrien's and empty can negative.  Pt has pain with active range of motion only.  Specific movements that illicit pain are turning her head to the left or her body to the left.    -recommend prescription for flexaril -hot packs or a heating pad to help relax the muscle.   -staying well hydrated

## 2016-10-23 NOTE — Progress Notes (Signed)
   CC: left shoulder pain  HPI:  [name] is a 61 y.o.   Pt comes in with left shoulder and trapezius pain that has increased over the last two weeks.  Pt denies any trauma to the area, goes to the gym and thought maybe she overdid it but the pain never seemed to go away.  Pt has not taken anything for it.  It is better with massaging the muscle and aggravated by exercise.  Pt had rotator cuff surgery over 10 years ago in [facility] due to a tear pt cannot remember which muscle.    Past Medical History:  Diagnosis Date  . Arthritis   . Hypertension   . Restless leg syndrome   . Sleep apnea    Review of Systems:  ROS: Pulmonary: pt denies increased work of breathing, shortness of breath,  Cardiac: pt denies palpitations, chest pain,  Abdominal: pt denies abdominal pain, nausea, vomiting, or diarrhea  Physical Exam:  Vitals:   10/23/16 1015  BP: (!) 145/69  Pulse: 74  Temp: 97.6 F (36.4 C)  TempSrc: Oral  SpO2: 100%  Weight: 245 lb (111.1 kg)   Physical Exam  Constitutional: She appears well-developed and well-nourished.  HENT:  Head: Normocephalic and atraumatic.  Neck: Normal range of motion. Neck supple. No JVD present. No thyromegaly present.  Cardiovascular: Normal rate and regular rhythm.     Assessment & Plan:     See Encounters Tab for problem based charting.  Patient seen with Dr. Eppie Gibson

## 2016-10-23 NOTE — Patient Instructions (Signed)
Please take your flexeril as needed to help loosen up your trapezius muscle I have also prescribed some cream to put on it that will help A heating pad applied to the area will be helpful as well Please try to not lift any weights while this heals

## 2016-10-23 NOTE — Progress Notes (Signed)
I saw and evaluated the patient.  I personally confirmed the key portions of Dr. Winfrey's history and exam and reviewed pertinent patient test results.  The assessment, diagnosis, and plan were formulated together and I agree with the documentation in the resident's note. 

## 2016-10-23 NOTE — Addendum Note (Signed)
Addended by: Guadlupe Spanish B on: 10/23/2016 02:18 PM   Modules accepted: Orders

## 2016-12-25 ENCOUNTER — Other Ambulatory Visit: Payer: Self-pay | Admitting: *Deleted

## 2016-12-26 MED ORDER — METFORMIN HCL 1000 MG PO TABS: ORAL_TABLET | ORAL | 1 refills | Status: DC

## 2017-01-14 ENCOUNTER — Encounter: Payer: Self-pay | Admitting: Internal Medicine

## 2017-01-14 ENCOUNTER — Ambulatory Visit: Payer: Medicaid Other | Admitting: Internal Medicine

## 2017-01-14 ENCOUNTER — Other Ambulatory Visit: Payer: Self-pay | Admitting: *Deleted

## 2017-01-14 ENCOUNTER — Other Ambulatory Visit: Payer: Self-pay

## 2017-01-14 VITALS — BP 115/63 | HR 74 | Temp 97.9°F | Ht 61.5 in | Wt 247.4 lb

## 2017-01-14 DIAGNOSIS — S46812D Strain of other muscles, fascia and tendons at shoulder and upper arm level, left arm, subsequent encounter: Secondary | ICD-10-CM | POA: Diagnosis not present

## 2017-01-14 DIAGNOSIS — X58XXXD Exposure to other specified factors, subsequent encounter: Secondary | ICD-10-CM

## 2017-01-14 MED ORDER — CYCLOBENZAPRINE HCL 5 MG PO TABS: 5.0000 mg | ORAL_TABLET | Freq: Three times a day (TID) | ORAL | 0 refills | Status: DC | PRN

## 2017-01-14 NOTE — Patient Instructions (Signed)
Kristen Frost,  I think you likely have strained that muscle. Since it has persisted so long I think it would be a good idea to get some physical therapy to help get past this injury. I will also give you a short course of a muscle relaxer. Continue using ice or heat and the icy hot as needed.

## 2017-01-14 NOTE — Progress Notes (Signed)
Internal Medicine Clinic Attending  Case discussed with Dr. Boswell  at the time of the visit.  We reviewed the resident's history and exam and pertinent patient test results.  I agree with the assessment, diagnosis, and plan of care documented in the resident's note.  Alexander N Raines, MD   

## 2017-01-14 NOTE — Progress Notes (Signed)
   CC: Medication questions  HPI:  Kristen Frost is a 61 y.o. female with a past medical history listed below here today for follow up of her trapezius muscle strain.  She was seen 10/23/16 with complaints of left shoulder pain. Exam was reassuring at that time and thought to be secondary to trapezius muscle strain. She reports today that she has had continued pain in her left shoulder region since that time. She says that the pain is a burning, cramping pain. Reports it is nearly constant. Nothing aggravates the pain. She is able to use her left arm without any difficulty or exacerbation of her pain. No numbness, weakness, or pain in her left arm. No neck pain. She reports she has used the flexeril prescribed last visit with some help. Also notes some improvement with heat and icy hot cream.  Past Medical History:  Diagnosis Date  . Arthritis   . Hypertension   . Restless leg syndrome   . Sleep apnea    Review of Systems:   No chest pain or shortness of breath  Physical Exam:  Vitals:   01/14/17 0924  BP: 115/63  Pulse: 74  Temp: 97.9 F (36.6 C)  TempSrc: Oral  SpO2: 100%  Weight: 247 lb 6.4 oz (112.2 kg)  Height: 5' 1.5" (1.562 m)   Physical Exam  Constitutional: She is oriented to person, place, and time and well-developed, well-nourished, and in no distress.  HENT:  Head: Normocephalic and atraumatic.  Cardiovascular: Normal rate and regular rhythm.  Pulmonary/Chest: Effort normal and breath sounds normal.  Musculoskeletal:       Left shoulder: Normal. She exhibits normal range of motion, no tenderness, no bony tenderness, no swelling, no effusion, no crepitus and no pain.  Neurological: She is alert and oriented to person, place, and time. She has normal sensation, normal strength and normal reflexes.    Assessment & Plan:   See Encounters Tab for problem based charting.  Patient discussed with Dr. Rebeca Alert

## 2017-01-14 NOTE — Assessment & Plan Note (Signed)
Continued symptoms from previous visit. Exam reassuring with normal strength and sensation. Normal shoulder exam with normal strength and no pain on active or passive movement.   Symptoms not consistent with radiculopathy pain. No signs of rotator cuff tear or any bony abnormalities. Agree with previous assessment that this is most likely muscular in nature.  Will send in new Rx for flexeril. Continue ice/heat, icy/hot prn.  Since no improvement in the past 2 months, will refer to outpatient PT for further therapy.

## 2017-01-15 MED ORDER — LOSARTAN POTASSIUM 25 MG PO TABS: 25.0000 mg | ORAL_TABLET | Freq: Every day | ORAL | 1 refills | Status: DC

## 2017-01-22 ENCOUNTER — Encounter: Payer: Self-pay | Admitting: Physical Therapy

## 2017-01-22 ENCOUNTER — Ambulatory Visit: Payer: Medicaid Other | Attending: Internal Medicine | Admitting: Physical Therapy

## 2017-01-22 DIAGNOSIS — M542 Cervicalgia: Secondary | ICD-10-CM | POA: Diagnosis present

## 2017-01-22 DIAGNOSIS — G8929 Other chronic pain: Secondary | ICD-10-CM | POA: Diagnosis present

## 2017-01-22 DIAGNOSIS — M791 Myalgia, unspecified site: Secondary | ICD-10-CM | POA: Diagnosis present

## 2017-01-22 DIAGNOSIS — M25512 Pain in left shoulder: Secondary | ICD-10-CM | POA: Diagnosis present

## 2017-01-22 NOTE — Therapy (Signed)
[place], [place], [address] Phone: [phone]   Fax:  [phone]  Physical Therapy Evaluation  Patient Details  Name: Kristen Frost MRN: [id number] Date of Birth: Jul 19, 1955 Referring Provider: Dr. [place]   Encounter Date: 01/22/2017  PT End of Session - 01/22/17 0857    Visit Number  1    Number of Visits  15    Date for PT Re-Evaluation  03/19/17    PT Start Time  0848    PT Stop Time  0935    PT Time Calculation (min)  47 min    Activity Tolerance  Patient tolerated treatment well    Behavior During Therapy  [facility] for tasks assessed/performed       Past Medical History:  Diagnosis Date  . Arthritis   . Hypertension   . Restless leg syndrome   . Sleep apnea     Past Surgical History:  Procedure Laterality Date  . CHOLECYSTECTOMY N/A 06/07/2016   Procedure: LAPAROSCOPIC CHOLECYSTECTOMY;  Surgeon: Georganna Skeans, MD;  Location: [place];  Service: General;  Laterality: N/A;  . ROTATOR CUFF REPAIR  2007/2008   left    There were no vitals filed for this visit.   Subjective Assessment - 01/22/17 0851    Subjective  Pt reports with L muscular pain in scapula for approx.  3 mos, states she was at the gym doing and ab crunch exercise with machine.  She developed pain in L shoulder after that and it has continued since then.  She notices some puffiness in top of shoulder.  Pain at lateral arm and the top of her shoulder. She has pain with cervical rotation and finds it difficult to be comfortable with all mobility tasks.     Pertinent History  Lt. rotator cuff repair 2005, HTN, obesity, arthritis    Limitations  Lifting;House hold activities    Diagnostic tests  none     Patient Stated Goals  less pain, return to gym     Currently in Pain?  Yes    Pain Score  8     Pain Location  Shoulder    Pain Orientation  Left    Pain Descriptors / Indicators  Sharp;Burning;Aching    Pain Type   Chronic pain    Pain Radiating Towards  above elbow     Pain Onset  More than a month ago    Pain Frequency  Constant    Aggravating Factors   using arm, nothing in particular    Pain Relieving Factors  flexeril, hot water         [facility] PT Assessment - 01/22/17 0001      Assessment   Medical Diagnosis  L trapezius strain    Referring Provider  Dr. [place]    Onset Date/Surgical Date  -- 3 mos approx.  per pt.     Hand Dominance  Right    Prior Therapy  yes      Precautions   Precautions  None      Restrictions   Weight Bearing Restrictions  No      Balance Screen   Has the patient fallen in the past 6 months  No    Has the patient had a decrease in activity level because of a fear of falling?   No    Is the patient reluctant to leave their home because of a fear of falling?   No  [place]  Private residence    Living Arrangements  Spouse/significant other;Children    Type of [place]  Unemployed    Leisure  I need some fun       Observation/Other Assessments   Focus on Therapeutic Outcomes (FOTO)   NT due to MCD       Observation/Other Assessments-Edema    Edema  -- mildly.puffy superior aspect of L shoulder , low cervical      Sensation   Light Touch  Appears Intact      Posture/Postural Control   Posture/Postural Control  Postural limitations    Postural Limitations  Rounded Shoulders;Forward head;Flexed trunk    Posture Comments  elevated shoulder Rt >Lt,      AROM   Right Shoulder Flexion  155 Degrees    Left Shoulder Flexion  150 Degrees    Left Shoulder ABduction  150 Degrees    Left Shoulder Internal Rotation  -- FR reaches low back min pain     Left Shoulder External Rotation  -- FR to back of head with mild difficulty    Cervical Flexion  WFL    Cervical Extension  limited 15-25%    Cervical - Right Side Bend  WNL    Cervical - Left Side Bend  lacks 25% pain      Cervical - Right Rotation  WFL min stretch     Cervical - Left Rotation  lacks 25% pain L       PROM   Overall PROM Comments  WNL L UE all planes       Strength   Right/Left Shoulder  -- Rt.  4+/5     Left Shoulder Flexion  4+/5    Left Shoulder ABduction  3+/5      Palpation   Palpation comment  TTP L upper trap, levator scap proximally, subocciipitals       Special Tests    Special Tests  Cervical;Rotator Cuff Impingement    Cervical Tests  Spurling's;Dictraction    Rotator Cuff Impingment tests  Empty Can test      Spurling's   Findings  Positive    Side  Left    Comment  reproduced sx in post shoulder       Distraction Test   Findngs  Positive    side  Left    Comment  relieved pain       Empty Can test   Findings  Negative             Objective measurements completed on examination: See above findings.     PT Education - 01/22/17 1312    Education provided  Yes    Education Details  PT/POC, HEP, ROM, cervical spine, referred pain , MHP , posture     Person(s) Educated  Patient    Methods  Explanation;Handout    Comprehension  Verbalized understanding;Verbal cues required;Tactile cues required;Need further instruction       PT Short Term Goals - 01/22/17 1314      PT SHORT TERM GOAL #1   Title  Pt will be I with initial HEP     Baseline  unknown, given today     Time  2    Period  Weeks    Status  New    Target Date  02/05/17      PT SHORT TERM GOAL #2   Title  Pt will notice less pain with rotation of cervical spine to each side.     Baseline  pain to L. on L.    Time  2    Period  Weeks    Status  New    Target Date  02/05/17      PT SHORT TERM GOAL #3   Title  Pt will notice pain will become more intermittent and less intense.     Baseline  pain usually >6/10-8/10    Time  2    Period  Weeks    Status  New    Target Date  02/05/17        PT Long Term Goals - 01/22/17 1316      PT LONG TERM GOAL #1   Title  Pt will be able to  lift her L arm overhead with only min pain for improved reaching, ADLs    Baseline  pain severe     Time  8    Period  Weeks    Status  New    Target Date  03/19/17      PT LONG TERM GOAL #2   Title  Pt will be I with HEP for cervical stab, posture, ROM, UE strength     Baseline  unknown     Time  8    Period  Weeks    Status  New    Target Date  03/19/17      PT LONG TERM GOAL #3   Title  Pt will be able to carry items waist height without increasing pain in L UE     Baseline  avoids due to pain     Time  8    Period  Weeks    Status  New    Target Date  03/19/17      PT LONG TERM GOAL #4   Title  Pt will demo pain free AROM in cervical spine     Baseline  pain with L rot and lateral flexion     Time  8    Period  Weeks    Status  New    Target Date  03/19/17      PT LONG TERM GOAL #5   Title  Pt will be able to return to the gym without exacerbation of pain, modified routine.     Baseline  has not done in months    Time  8    Period  Weeks    Status  New    Target Date  03/19/17             Plan - 01/22/17 1323    Clinical Impression Statement  Pt presents for low complexity eval of L sided shoulder pain which is consistent with more of a cervical radiculopathy after completion of eval.  She has full ROM of L UE and is fairly strong.  Symptoms reproduced with compression of nerve roots on L side, improved with gentle distraction,.  She shoudl do well with PT intervention to provide education and corrective exercise to reduce neural tension on L side which is referring pain to L shoulder.     Clinical Presentation  Stable    Clinical Decision Making  Low    Rehab Potential  Excellent    PT Frequency  2x / week 1st auth includes 3 visits then 2x6 more weeks     PT Duration  8 weeks    PT Treatment/Interventions  ADLs/Self Care  Home Management;Electrical Stimulation;Functional mobility training;Neuromuscular  re-education;Taping;Cryotherapy;Ultrasound;Traction;Moist Heat;Therapeutic exercise;Therapeutic activities;Patient/family education;Manual techniques;Passive range of motion;Dry needling    PT Next Visit Plan  check HEP, manual, consider dry needling, tape to inhibit UT    PT Home Exercise Plan  cervical retract, scap retact, UT stretch , posture     Consulted and Agree with Plan of Care  Patient       Patient will benefit from skilled therapeutic intervention in order to improve the following deficits and impairments:  Decreased mobility, Hypomobility, Obesity, Improper body mechanics, Increased edema, Decreased range of motion, Decreased strength, Increased fascial restricitons, Impaired flexibility, Impaired UE functional use, Postural dysfunction, Pain  Visit Diagnosis: Cervicalgia  Chronic left shoulder pain  Myalgia     Problem List Patient Active Problem List   Diagnosis Date Noted  . Trapezius muscle strain 10/23/2016  . CKD (chronic kidney disease) stage 2, GFR 60-89 ml/min 08/09/2016  . Hypokalemia 06/07/2016  . Symptomatic cholelithiasis 06/05/2016  . BV (bacterial vaginosis) 05/19/2016  . Pap smear for cervical cancer screening 05/17/2016  . Sinus congestion 05/17/2016  . Vitamin D deficiency 03/09/2016  . Dyslipidemia 03/02/2016  . Morbid obesity ([place]) 03/01/2016  . T2DM (type 2 diabetes mellitus) ([place]) 03/01/2016  . HTN (hypertension) 11/17/2015  . Anemia 11/17/2015  . Bilateral thigh pain 11/17/2015  . OSA (obstructive sleep apnea) 12/04/2011  . RLS (restless legs syndrome) 12/04/2011    PAA,JENNIFER 01/22/2017, 1:30 PM  [facility] [address] [place], [place], [address] Phone: [phone]   Fax:  [phone]  Name: Kristen Frost MRN: [id number] Date of Birth: 08/26/1955   Raeford Razor, PT 01/22/17 1:30 PM Phone: [phone] Fax: [phone]

## 2017-02-04 ENCOUNTER — Encounter: Payer: Self-pay | Admitting: Physical Therapy

## 2017-02-04 ENCOUNTER — Ambulatory Visit: Payer: Medicaid Other | Admitting: Physical Therapy

## 2017-02-04 DIAGNOSIS — M542 Cervicalgia: Secondary | ICD-10-CM

## 2017-02-04 DIAGNOSIS — M25512 Pain in left shoulder: Secondary | ICD-10-CM

## 2017-02-04 DIAGNOSIS — G8929 Other chronic pain: Secondary | ICD-10-CM

## 2017-02-04 DIAGNOSIS — M791 Myalgia, unspecified site: Secondary | ICD-10-CM

## 2017-02-04 NOTE — Therapy (Signed)
[place], [place], [address] Phone: [phone]   Fax:  [phone]  Physical Therapy Treatment  Patient Details  Name: Kristen Frost MRN: [id number] Date of Birth: 01/01/1956 Referring Provider: Dr. [place]   Encounter Date: 02/04/2017  PT End of Session - 02/04/17 0858    Visit Number  2    Number of Visits  15    Date for PT Re-Evaluation  03/19/17    PT Start Time  0848    PT Stop Time  0938    PT Time Calculation (min)  50 min    Activity Tolerance  Patient tolerated treatment well    Behavior During Therapy  [facility] for tasks assessed/performed       Past Medical History:  Diagnosis Date  . Arthritis   . Hypertension   . Restless leg syndrome   . Sleep apnea     Past Surgical History:  Procedure Laterality Date  . CHOLECYSTECTOMY N/A 06/07/2016   Procedure: LAPAROSCOPIC CHOLECYSTECTOMY;  Surgeon: Georganna Skeans, MD;  Location: [place];  Service: General;  Laterality: N/A;  . ROTATOR CUFF REPAIR  2007/2008   left    There were no vitals filed for this visit.  Subjective Assessment - 02/04/17 0854    Subjective  That stretch you did really alleviated it.  I feel it in my L shouder (point). She says it is getting better.      Currently in Pain?  Yes    Pain Score  5     Pain Location  Shoulder    Pain Orientation  Left    Pain Descriptors / Indicators  Stabbing    Pain Type  Chronic pain    Pain Onset  More than a month ago    Pain Frequency  Intermittent eases a bit     Aggravating Factors   using arm reaching     Pain Relieving Factors  stretching, heat     Effect of Pain on Daily Activities  painful movement         [facility] Adult PT Treatment/Exercise - 02/04/17 0001      Neck Exercises: Supine   Neck Retraction  10 reps;5 secs    Capital Flexion  10 reps    Capital Flexion Limitations  used soft ball    Cervical Rotation  10 reps    Cervical Rotation Limitations   ball     Upper Extremity Flexion with Stabilization  Flexion;Extension;10 reps    Other Supine Exercise  scapular retraction , 5 sec x 10       Modalities   Modalities  Moist Heat      Moist Heat Therapy   Number Minutes Moist Heat  10 Minutes    Moist Heat Location  Cervical      Manual Therapy   Manual Therapy  Joint mobilization;Soft tissue mobilization;Myofascial release;Passive ROM;Manual Traction    Soft tissue mobilization  L upper trap, L levator scapula and bilateral post cervicals     Myofascial Release  L cervicals    Passive ROM  rotation and sidelbending     Manual Traction  gentle       Neck Exercises: Stretches   Upper Trapezius Stretch  3 reps;30 seconds    Levator Stretch  3 reps             PT Education - 02/04/17 0857    Education provided  Yes    Education Details  HEP,  correction    Person(s) Educated  Patient    Methods  Explanation    Comprehension  Verbalized understanding       PT Short Term Goals - 02/04/17 0933      PT SHORT TERM GOAL #1   Title  Pt will be I with initial HEP     Baseline  needed cues today     Status  On-going      PT SHORT TERM GOAL #2   Title  Pt will notice less pain with rotation of cervical spine to each side.     Baseline  pain to L. on L.    Status  On-going      PT SHORT TERM GOAL #3   Title  Pt will notice pain will become more intermittent and less intense.     Baseline  pain usually >6/10-8/10    Status  On-going        PT Long Term Goals - 02/04/17 0934      PT LONG TERM GOAL #1   Title  Pt will be able to lift her L arm overhead with only min pain for improved reaching, ADLs    Baseline  pain severe     Status  On-going      PT LONG TERM GOAL #2   Title  Pt will be I with HEP for cervical stab, posture, ROM, UE strength     Baseline  unknown     Status  On-going      PT LONG TERM GOAL #3   Title  Pt will be able to carry items waist height without increasing pain in L UE     Baseline   avoids due to pain     Status  On-going      PT LONG TERM GOAL #4   Title  Pt will demo pain free AROM in cervical spine     Baseline  pain with L rot and lateral flexion     Status  On-going      PT LONG TERM GOAL #5   Title  Pt will be able to return to the gym without exacerbation of pain, modified routine.     Baseline  has not done in months    Status  On-going            Plan - 02/04/17 0935    Clinical Impression Statement  Patient returns for 1st visit, pain is still constant but finding more comfort at rest.  She was able to do HEP with min cues.  Pain and tightness L neck and shoulder reproduced with manual work.  Looking forward to dry needling for pain relief.     PT Frequency  2x / week    PT Duration  8 weeks    PT Treatment/Interventions  ADLs/Self Care Home Management;Electrical Stimulation;Functional mobility training;Neuromuscular re-education;Taping;Cryotherapy;Ultrasound;Traction;Moist Heat;Therapeutic exercise;Therapeutic activities;Patient/family education;Manual techniques;Passive range of motion;Dry needling    PT Next Visit Plan  manual, consider dry needling, try tape to inhibit UT, cervical ROM and scap stabilization     PT Home Exercise Plan  cervical retract, scap retact, UT stretch , posture     Consulted and Agree with Plan of Care  Patient       Patient will benefit from skilled therapeutic intervention in order to improve the following deficits and impairments:  Decreased mobility, Hypomobility, Obesity, Improper body mechanics, Increased edema, Decreased range of motion, Decreased strength, Increased fascial restricitons, Impaired flexibility, Impaired UE functional use,  Postural dysfunction, Pain  Visit Diagnosis: Cervicalgia  Chronic left shoulder pain  Myalgia     Problem List Patient Active Problem List   Diagnosis Date Noted  . Trapezius muscle strain 10/23/2016  . CKD (chronic kidney disease) stage 2, GFR 60-89 ml/min 08/09/2016   . Hypokalemia 06/07/2016  . Symptomatic cholelithiasis 06/05/2016  . BV (bacterial vaginosis) 05/19/2016  . Pap smear for cervical cancer screening 05/17/2016  . Sinus congestion 05/17/2016  . Vitamin D deficiency 03/09/2016  . Dyslipidemia 03/02/2016  . Morbid obesity ([place]) 03/01/2016  . T2DM (type 2 diabetes mellitus) ([place]) 03/01/2016  . HTN (hypertension) 11/17/2015  . Anemia 11/17/2015  . Bilateral thigh pain 11/17/2015  . OSA (obstructive sleep apnea) 12/04/2011  . RLS (restless legs syndrome) 12/04/2011    Anthonie Lotito 02/04/2017, 9:40 AM  [facility] [address] [place], [place], [address] Phone: [phone]   Fax:  [phone]  Name: Kristen Frost MRN: [id number] Date of Birth: 05/26/1955   Raeford Razor, PT 02/04/17 9:41 AM Phone: [phone] Fax: [phone]

## 2017-02-06 ENCOUNTER — Encounter: Payer: Self-pay | Admitting: Physical Therapy

## 2017-02-06 ENCOUNTER — Ambulatory Visit: Payer: Medicaid Other | Admitting: Physical Therapy

## 2017-02-06 DIAGNOSIS — M791 Myalgia, unspecified site: Secondary | ICD-10-CM

## 2017-02-06 DIAGNOSIS — G8929 Other chronic pain: Secondary | ICD-10-CM

## 2017-02-06 DIAGNOSIS — M542 Cervicalgia: Secondary | ICD-10-CM

## 2017-02-06 DIAGNOSIS — M25512 Pain in left shoulder: Secondary | ICD-10-CM

## 2017-02-06 NOTE — Therapy (Addendum)
[place], [place], [address] Phone: [phone]   Fax:  [phone]  Physical Therapy Treatment  Patient Details  Name: Kristen Frost MRN: [id number] Date of Birth: [birth year] Referring Provider: Dr. [place]   Encounter Date: 02/06/2017  PT End of Session - 02/06/17 0931    Visit Number  3    Number of Visits  15    Date for PT Re-Evaluation  03/19/17    PT Start Time  0848    PT Stop Time  0938    PT Time Calculation (min)  50 min    Activity Tolerance  Patient tolerated treatment well    Behavior During Therapy  [facility] for tasks assessed/performed       Past Medical History:  Diagnosis Date  . Arthritis   . Hypertension   . Restless leg syndrome   . Sleep apnea     Past Surgical History:  Procedure Laterality Date  . CHOLECYSTECTOMY N/A 06/07/2016   Procedure: LAPAROSCOPIC CHOLECYSTECTOMY;  Surgeon: Georganna Skeans, MD;  Location: [place];  Service: General;  Laterality: N/A;  . ROTATOR CUFF REPAIR  2007/2008   left    There were no vitals filed for this visit.  Subjective Assessment - 02/06/17 0850    Subjective  "I am still feel sore after the last session but she really worked the spot last"    Currently in Pain?  Yes    Pain Score  6     Pain Location  Shoulder    Pain Orientation  Left    Pain Descriptors / Indicators  Aching;Stabbing    Pain Type  Chronic pain         [facility] PT Assessment - 02/06/17 0001      AROM   Cervical - Right Rotation  80    Cervical - Left Rotation  63                  [facility] Adult PT Treatment/Exercise - 02/06/17 0916      Neck Exercises: Supine   Neck Retraction  10 reps;10 secs chin tuck head lift    Other Supine Exercise  scapular retraction with ER bil 2 x 12 with red theraband      Shoulder Exercises: Supine   Protraction  10 reps;Theraband;Both;Strengthening    Theraband Level (Shoulder Protraction)  Level 2 (Red) with  sustained scapular abd      Manual Therapy   Manual Therapy  Taping    Manual therapy comments  skilled palpation and monitoring pt throughout TPDN.    Soft tissue mobilization  IASTM over L upper trap/ levator scapulae    Myofascial Release  fascial rolling/ stretching over the L upper trap/ levator scapulae    McConnell  L UT inhibition taping      Neck Exercises: Stretches   Upper Trapezius Stretch  3 reps;30 seconds contract/ relax with 10 sec contraction       Trigger Point Dry Needling - 02/06/17 0934    Consent Given?  Yes    Education Handout Provided  Yes    Muscles Treated Upper Body  Upper trapezius    Upper Trapezius Response  Twitch reponse elicited;Palpable increased muscle length L only           PT Education - 02/06/17 0930    Education provided  Yes    Education Details  anatomy of muscle and referral patterns. What TPDN is, what to expected, and  after care. benefits of taping and length.  performed before and throughout tx    Person(s) Educated  Patient    Methods  Explanation;Verbal cues    Comprehension  Verbalized understanding;Verbal cues required       PT Short Term Goals - 02/04/17 0933      PT SHORT TERM GOAL #1   Title  Pt will be I with initial HEP     Baseline  needed cues today     Status  On-going      PT SHORT TERM GOAL #2   Title  Pt will notice less pain with rotation of cervical spine to each side.     Baseline  pain to L. on L.    Status  On-going      PT SHORT TERM GOAL #3   Title  Pt will notice pain will become more intermittent and less intense.     Baseline  pain usually >6/10-8/10    Status  On-going        PT Long Term Goals - 02/04/17 0934      PT LONG TERM GOAL #1   Title  Pt will be able to lift her L arm overhead with only min pain for improved reaching, ADLs    Baseline  pain severe     Status  On-going      PT LONG TERM GOAL #2   Title  Pt will be I with HEP for cervical stab, posture, ROM, UE strength      Baseline  unknown     Status  On-going      PT LONG TERM GOAL #3   Title  Pt will be able to carry items waist height without increasing pain in L UE     Baseline  avoids due to pain     Status  On-going      PT LONG TERM GOAL #4   Title  Pt will demo pain free AROM in cervical spine     Baseline  pain with L rot and lateral flexion     Status  On-going      PT LONG TERM GOAL #5   Title  Pt will be able to return to the gym without exacerbation of pain, modified routine.     Baseline  has not done in months    Status  On-going            Plan - 02/06/17 0932    Clinical Impression Statement  pt reports continued pain and soreness inthe shoulder today and states she has been compliant with her HEP. educated and performed DN on the L upper trap followed with manual techniques and inhibition taping. She perfrom scapular stability exercises with no reprot of pain required verbal cues for proper form. utilized MHP post session to calm down soreness post session.     PT Next Visit Plan  manual, assess response to DN/ taping to inhibit UT, cervical ROM and scap stabilization     PT Home Exercise Plan  cervical retract, scap retact, UT stretch , posture     Consulted and Agree with Plan of Care  Patient       Patient will benefit from skilled therapeutic intervention in order to improve the following deficits and impairments:  Decreased mobility, Hypomobility, Obesity, Improper body mechanics, Increased edema, Decreased range of motion, Decreased strength, Increased fascial restricitons, Impaired flexibility, Impaired UE functional use, Postural dysfunction, Pain  Visit Diagnosis: Cervicalgia  Chronic left shoulder  pain  Myalgia     Problem List Patient Active Problem List   Diagnosis Date Noted  . Trapezius muscle strain 10/23/2016  . CKD (chronic kidney disease) stage 2, GFR 60-89 ml/min 08/09/2016  . Hypokalemia 06/07/2016  . Symptomatic cholelithiasis 06/05/2016  . BV  (bacterial vaginosis) 05/19/2016  . Pap smear for cervical cancer screening 05/17/2016  . Sinus congestion 05/17/2016  . Vitamin D deficiency 03/09/2016  . Dyslipidemia 03/02/2016  . Morbid obesity ([place]) 03/01/2016  . T2DM (type 2 diabetes mellitus) ([place]) 03/01/2016  . HTN (hypertension) 11/17/2015  . Anemia 11/17/2015  . Bilateral thigh pain 11/17/2015  . OSA (obstructive sleep apnea) 12/04/2011  . RLS (restless legs syndrome) 12/04/2011   Starr Lake PT, DPT, LAT, ATC  02/06/17  9:42 AM      [facility] [address] [place], [place], [address] Phone: [phone]   Fax:  [phone]  Name: Kristen Frost MRN: [id number] Date of Birth: Apr 26, 1955

## 2017-02-11 ENCOUNTER — Ambulatory Visit: Payer: Medicaid Other | Admitting: Physical Therapy

## 2017-02-11 DIAGNOSIS — M25512 Pain in left shoulder: Secondary | ICD-10-CM

## 2017-02-11 DIAGNOSIS — M542 Cervicalgia: Secondary | ICD-10-CM

## 2017-02-11 DIAGNOSIS — M791 Myalgia, unspecified site: Secondary | ICD-10-CM

## 2017-02-11 DIAGNOSIS — G8929 Other chronic pain: Secondary | ICD-10-CM

## 2017-02-11 NOTE — Patient Instructions (Signed)
Low Row: Thumbs Up    Face anchor, medium to wide stance. Thumbs up, pull arms back, squeezing shoulder blades together.  Repeat _10_ times per set. Do _1-2_ sets per session. Do _7_ sessions per week. Anchor Height: Waist  http://tub.exer.us/68   Copyright  VHI. All rights reserved.   EXTENSION: Standing - Resistance Band: Stable (Active)    Stand, right arm at side. Against yellow resistance band, draw arm backward, as far as possible, keeping elbow straight. Complete _1_ sets of __10_ repetitions. Perform __2_ sessions per day.  Copyright  VHI. All rights reserved.     Resisted Horizontal Abduction: Bilateral    Sit or stand, tubing in both hands, arms out in front. Keeping arms straight, pinch shoulder blades together and stretch arms out. Repeat __10__ times per set. Do ____1 sets per session. Do __2__ sessions per day.  http://orth.exer.us/969   Copyright  VHI. All rights reserved.

## 2017-02-11 NOTE — Therapy (Signed)
[place], [place], [address] Phone: [phone]   Fax:  [phone]  Physical Therapy Re-Evaluation  Patient Details  Name: Kristen Frost MRN: [id number] Date of Birth: August 22, 1955 Referring Provider: Dr. [place]   Encounter Date: 02/11/2017  PT End of Session - 02/11/17 0856    Visit Number  4    Number of Visits  15    Date for PT Re-Evaluation  03/19/17    PT Start Time  0846    PT Stop Time  0939    PT Time Calculation (min)  53 min    Activity Tolerance  Patient tolerated treatment well    Behavior During Therapy  [facility] for tasks assessed/performed       Past Medical History:  Diagnosis Date  . Arthritis   . Hypertension   . Restless leg syndrome   . Sleep apnea     Past Surgical History:  Procedure Laterality Date  . CHOLECYSTECTOMY N/A 06/07/2016   Procedure: LAPAROSCOPIC CHOLECYSTECTOMY;  Surgeon: Georganna Skeans, MD;  Location: [place];  Service: General;  Laterality: N/A;  . ROTATOR CUFF REPAIR  2007/2008   left    There were no vitals filed for this visit.   Subjective Assessment - 02/11/17 0849    Subjective  I feel like I am getting better.  I still have pain.  I can turn my neck better, more than before.     Currently in Pain?  Yes    Pain Score  5     Pain Location  Shoulder    Pain Orientation  Left    Pain Descriptors / Indicators  Sore    Pain Type  Chronic pain    Pain Onset  More than a month ago    Pain Frequency  Intermittent    Aggravating Factors   turning head     Pain Relieving Factors  stretch, heat, needling     Multiple Pain Sites  No         [facility] PT Assessment - 02/11/17 0001      AROM   Cervical - Right Rotation  80    Cervical - Left Rotation  60      Strength   Left Shoulder Flexion  4+/5    Left Shoulder ABduction  4+/5         Objective measurements completed on examination: See above findings.      [place] Adult PT  Treatment/Exercise - 02/11/17 0001      Shoulder Exercises: Standing   Horizontal ABduction  Strengthening;Both;10 reps    Theraband Level (Shoulder Horizontal ABduction)  Level 2 (Red)    External Rotation  Strengthening;Both;10 reps    Theraband Level (Shoulder External Rotation)  Level 2 (Red)    Extension  Strengthening;Both;10 reps    Theraband Level (Shoulder Extension)  Level 2 (Red)    Row  Strengthening;Both;15 reps    Theraband Level (Shoulder Row)  Level 2 (Red)    Other Standing Exercises  head pressed back into ball on wall       Manual Therapy   Soft tissue mobilization  IASTM over L upper trap/ levator scapulae lower cervicals L side     McConnell  L UT inhibition taping      Neck Exercises: Stretches   Upper Trapezius Stretch  3 reps;30 seconds    Levator Stretch  2 reps;30 seconds    Other Neck Stretches  chin tuck x 10 seated  5 sec              PT Education - 02/11/17 0853    Education provided  Yes    Education Details  renewal, new HEP for scap, UE strength     Person(s) Educated  Patient    Methods  Explanation    Comprehension  Returned demonstration;Verbalized understanding       PT Short Term Goals - 02/11/17 0857      PT SHORT TERM GOAL #1   Title  Pt will be I with initial HEP     Baseline  min cues for technique    Status  On-going      PT SHORT TERM GOAL #2   Title  Pt will notice less pain with rotation of cervical spine to each side.     Baseline  min pain only L rotation on L side     Status  Achieved      PT SHORT TERM GOAL #3   Title  Pt will notice pain will become more intermittent and less intense.     Baseline  has times when she has no pain, usually moderate     Status  Achieved        PT Long Term Goals - 02/11/17 0858      PT LONG TERM GOAL #1   Title  Pt will be able to lift her L arm overhead with only min pain for improved reaching, ADLs    Baseline  can do with min pain     Status  Achieved      PT LONG TERM  GOAL #2   Title  Pt will be I with HEP for cervical stab, posture, ROM, UE strength     Baseline  ongoing, knows initial HEP     Status  On-going      PT LONG TERM GOAL #3   Title  Pt will be able to carry items waist height without increasing pain in L UE     Baseline  can do light items, grocery bags     Status  On-going      PT LONG TERM GOAL #4   Title  Pt will demo pain free AROM in cervical spine     Baseline  pain with L rot and lateral flexion     Status  On-going      PT LONG TERM GOAL #5   Title  Pt will be able to return to the gym without exacerbation of pain, modified routine.     Baseline  has not done since initial injury    Status  On-going             Plan - 02/11/17 0900    Clinical Impression Statement  Patient has completed 2 visits. Today was a re-evlauation of her progress and status for 2019.  She has met some of her STGs as well as her LTGs.  Her pain is more intermittent and less intense. She has periods when she is not aware of any pain at all.  She will benefit fom skilled PT intervention to ensure safety with gym program and offer education and corrective exercises for maximal functional recovery.      Clinical Presentation  Stable    Clinical Decision Making  Low    Rehab Potential  Excellent    PT Frequency  2x / week    PT Duration  6 weeks    PT Treatment/Interventions  ADLs/Self Care  Home Management;Electrical Stimulation;Functional mobility training;Neuromuscular re-education;Taping;Cryotherapy;Ultrasound;Traction;Moist Heat;Therapeutic exercise;Therapeutic activities;Patient/family education;Manual techniques;Passive range of motion;Dry needling    PT Next Visit Plan  manual, assess response to DN/ taping to inhibit UT, cervical ROM and scap stabilization     PT Home Exercise Plan  cervical retract, scap retact, UT stretch , posture , standing red band: row, extension and horiz pull     Consulted and Agree with Plan of Care  Patient        Patient will benefit from skilled therapeutic intervention in order to improve the following deficits and impairments:  Decreased mobility, Hypomobility, Obesity, Improper body mechanics, Increased edema, Decreased range of motion, Decreased strength, Increased fascial restricitons, Impaired flexibility, Impaired UE functional use, Postural dysfunction, Pain  Visit Diagnosis: Cervicalgia  Chronic left shoulder pain  Myalgia     Problem List Patient Active Problem List   Diagnosis Date Noted  . Trapezius muscle strain 10/23/2016  . CKD (chronic kidney disease) stage 2, GFR 60-89 ml/min 08/09/2016  . Hypokalemia 06/07/2016  . Symptomatic cholelithiasis 06/05/2016  . BV (bacterial vaginosis) 05/19/2016  . Pap smear for cervical cancer screening 05/17/2016  . Sinus congestion 05/17/2016  . Vitamin D deficiency 03/09/2016  . Dyslipidemia 03/02/2016  . Morbid obesity ([place]) 03/01/2016  . T2DM (type 2 diabetes mellitus) ([place]) 03/01/2016  . HTN (hypertension) 11/17/2015  . Anemia 11/17/2015  . Bilateral thigh pain 11/17/2015  . OSA (obstructive sleep apnea) 12/04/2011  . RLS (restless legs syndrome) 12/04/2011    Georgeana Oertel 02/11/2017, 11:17 AM  [facility] [address] [place], [place], [address] Phone: [phone]   Fax:  [phone]  Name: MARIEANNE MARXEN MRN: [id number] Date of Birth: 1955-12-20   Raeford Razor, PT 02/11/17 11:18 AM Phone: [phone] Fax: [phone]

## 2017-02-15 ENCOUNTER — Ambulatory Visit: Payer: Medicaid Other | Admitting: Physical Therapy

## 2017-02-18 ENCOUNTER — Ambulatory Visit: Payer: Medicaid Other | Admitting: Physical Therapy

## 2017-02-20 ENCOUNTER — Encounter: Payer: Self-pay | Admitting: Physical Therapy

## 2017-02-20 ENCOUNTER — Ambulatory Visit: Payer: Medicaid Other | Attending: Internal Medicine | Admitting: Physical Therapy

## 2017-02-20 DIAGNOSIS — M542 Cervicalgia: Secondary | ICD-10-CM | POA: Diagnosis present

## 2017-02-20 DIAGNOSIS — G8929 Other chronic pain: Secondary | ICD-10-CM | POA: Diagnosis present

## 2017-02-20 DIAGNOSIS — M791 Myalgia, unspecified site: Secondary | ICD-10-CM | POA: Diagnosis present

## 2017-02-20 DIAGNOSIS — M25512 Pain in left shoulder: Secondary | ICD-10-CM | POA: Insufficient documentation

## 2017-02-20 NOTE — Therapy (Signed)
[place], [place], [address] Phone: [phone]   Fax:  [phone]  Physical Therapy Treatment  Patient Details  Name: Kristen Frost MRN: [id number] Date of Birth: 1955-11-09 Referring Provider: Dr. [place]   Encounter Date: 02/20/2017  PT End of Session - 02/20/17 0916    Visit Number  5    Number of Visits  15    Date for PT Re-Evaluation  03/19/17    PT Start Time  0901 pt arrived early, but wasn't checked in all the way    PT Stop Time  0941    PT Time Calculation (min)  40 min    Activity Tolerance  Patient tolerated treatment well    Behavior During Therapy  [facility] for tasks assessed/performed       Past Medical History:  Diagnosis Date  . Arthritis   . Hypertension   . Restless leg syndrome   . Sleep apnea     Past Surgical History:  Procedure Laterality Date  . CHOLECYSTECTOMY N/A 06/07/2016   Procedure: LAPAROSCOPIC CHOLECYSTECTOMY;  Surgeon: Georganna Skeans, MD;  Location: [place];  Service: General;  Laterality: N/A;  . ROTATOR CUFF REPAIR  2007/2008   left    There were no vitals filed for this visit.  Subjective Assessment - 02/20/17 0904    Subjective  "I am still sore, but I am feel like I am doing better"     Currently in Pain?  Yes    Pain Location  Shoulder    Pain Onset  More than a month ago    Pain Frequency  Intermittent    Aggravating Factors   turning head     Pain Relieving Factors  stretching, heat, needling                      [facility] Adult PT Treatment/Exercise - 02/20/17 0917      Neck Exercises: Seated   Other Seated Exercise  lower trap strengthening 2 x 10 with elbows on bolster using yellow theraband verbal cues for proper form and posture    Other Seated Exercise  seated thoracic rotation 2 x 10 bil, and seated thoracic extension1 x 10 over back of chair (verbal cues to keep chin tucked)      Shoulder Exercises: Standing   Row   Strengthening;Both;15 reps    Theraband Level (Shoulder Row)  Level 2 (Red)      Moist Heat Therapy   Number Minutes Moist Heat  10 Minutes    Moist Heat Location  Cervical      Manual Therapy   Manual Therapy  Joint mobilization    Joint Mobilization  grade 3-4 mobs T1-T6     Soft tissue mobilization  IASTM over L upper trap/ levator scapulae    McConnell  L UT inhibition taping      Neck Exercises: Stretches   Upper Trapezius Stretch  2 reps;30 seconds    Levator Stretch  2 reps;30 seconds       Trigger Point Dry Needling - 02/20/17 0906    Consent Given?  Yes    Education Handout Provided  No    Muscles Treated Upper Body  Levator scapulae    Upper Trapezius Response  Twitch reponse elicited;Palpable increased muscle length L     Levator Scapulae Response  Twitch response elicited;Palpable increased muscle length L only             PT  Short Term Goals - 02/11/17 0857      PT SHORT TERM GOAL #1   Title  Pt will be I with initial HEP     Baseline  min cues for technique    Status  On-going      PT SHORT TERM GOAL #2   Title  Pt will notice less pain with rotation of cervical spine to each side.     Baseline  min pain only L rotation on L side     Status  Achieved      PT SHORT TERM GOAL #3   Title  Pt will notice pain will become more intermittent and less intense.     Baseline  has times when she has no pain, usually moderate     Status  Achieved        PT Long Term Goals - 02/11/17 0858      PT LONG TERM GOAL #1   Title  Pt will be able to lift her L arm overhead with only min pain for improved reaching, ADLs    Baseline  can do with min pain     Status  Achieved      PT LONG TERM GOAL #2   Title  Pt will be I with HEP for cervical stab, posture, ROM, UE strength     Baseline  ongoing, knows initial HEP     Status  On-going      PT LONG TERM GOAL #3   Title  Pt will be able to carry items waist height without increasing pain in L UE     Baseline   can do light items, grocery bags     Status  On-going      PT LONG TERM GOAL #4   Title  Pt will demo pain free AROM in cervical spine     Baseline  pain with L rot and lateral flexion     Status  On-going      PT LONG TERM GOAL #5   Title  Pt will be able to return to the gym without exacerbation of pain, modified routine.     Baseline  has not done since initial injury    Status  On-going            Plan - 02/20/17 0932    Clinical Impression Statement  pt reports continued pain / tightness but improvement since previous sessions. Continued TPDN over the L upper trap/ levator scapulae. Followed DN with IASTM techniques and thoracic mobs. continued inhibition taping and scapular stability exercise. MHP post session which she reported decreased pain and tightness.     PT Treatment/Interventions  ADLs/Self Care Home Management;Electrical Stimulation;Functional mobility training;Neuromuscular re-education;Taping;Cryotherapy;Ultrasound;Traction;Moist Heat;Therapeutic exercise;Therapeutic activities;Patient/family education;Manual techniques;Passive range of motion;Dry needling    PT Next Visit Plan  manual, assess response to DN/ taping to inhibit UT, cervical ROM and scap stabilization     PT Home Exercise Plan  cervical retract, scap retact, UT stretch , posture , standing red band: row, extension and horiz pull , thoracic rotation/ extension over chair, lower trap strengthening.     Consulted and Agree with Plan of Care  Patient       Patient will benefit from skilled therapeutic intervention in order to improve the following deficits and impairments:  Decreased mobility, Hypomobility, Obesity, Improper body mechanics, Increased edema, Decreased range of motion, Decreased strength, Increased fascial restricitons, Impaired flexibility, Impaired UE functional use, Postural dysfunction, Pain  Visit Diagnosis: Cervicalgia  Chronic left shoulder pain  Myalgia     Problem  List Patient Active Problem List   Diagnosis Date Noted  . Trapezius muscle strain 10/23/2016  . CKD (chronic kidney disease) stage 2, GFR 60-89 ml/min 08/09/2016  . Hypokalemia 06/07/2016  . Symptomatic cholelithiasis 06/05/2016  . BV (bacterial vaginosis) 05/19/2016  . Pap smear for cervical cancer screening 05/17/2016  . Sinus congestion 05/17/2016  . Vitamin D deficiency 03/09/2016  . Dyslipidemia 03/02/2016  . Morbid obesity ([place]) 03/01/2016  . T2DM (type 2 diabetes mellitus) ([place]) 03/01/2016  . HTN (hypertension) 11/17/2015  . Anemia 11/17/2015  . Bilateral thigh pain 11/17/2015  . OSA (obstructive sleep apnea) 12/04/2011  . RLS (restless legs syndrome) 12/04/2011   Starr Lake PT, DPT, LAT, ATC  02/20/17  9:42 AM      [facility] [address] [place], [place], [address] Phone: [phone]   Fax:  [phone]  Name: Kristen Frost MRN: [id number] Date of Birth: [birth year]

## 2017-02-25 ENCOUNTER — Encounter: Payer: Self-pay | Admitting: Physical Therapy

## 2017-02-25 ENCOUNTER — Ambulatory Visit: Payer: Medicaid Other | Admitting: Physical Therapy

## 2017-02-25 DIAGNOSIS — G8929 Other chronic pain: Secondary | ICD-10-CM

## 2017-02-25 DIAGNOSIS — M542 Cervicalgia: Secondary | ICD-10-CM

## 2017-02-25 DIAGNOSIS — M25512 Pain in left shoulder: Secondary | ICD-10-CM

## 2017-02-25 DIAGNOSIS — M791 Myalgia, unspecified site: Secondary | ICD-10-CM

## 2017-02-25 NOTE — Therapy (Signed)
[place], [place], [address] Phone: [phone]   Fax:  [phone]  Physical Therapy Treatment  Patient Details  Name: Kristen Frost MRN: [id number] Date of Birth: [birth year] Referring Provider: Dr. [place]   Encounter Date: 02/25/2017  PT End of Session - 02/25/17 0929    Visit Number  6    Number of Visits  15    Date for PT Re-Evaluation  03/19/17    Authorization Type  MCD     PT Start Time  0845    PT Stop Time  0944    PT Time Calculation (min)  59 min    Activity Tolerance  Patient tolerated treatment well    Behavior During Therapy  [facility] for tasks assessed/performed       Past Medical History:  Diagnosis Date  . Arthritis   . Hypertension   . Restless leg syndrome   . Sleep apnea     Past Surgical History:  Procedure Laterality Date  . CHOLECYSTECTOMY N/A 06/07/2016   Procedure: LAPAROSCOPIC CHOLECYSTECTOMY;  Surgeon: Georganna Skeans, MD;  Location: [place];  Service: General;  Laterality: N/A;  . ROTATOR CUFF REPAIR  2007/2008   left    There were no vitals filed for this visit.  Subjective Assessment - 02/25/17 0849    Subjective  I was really sore the last session after needling.     Currently in Pain?  Yes    Pain Score  5     Pain Location  Shoulder    Pain Orientation  Left    Pain Descriptors / Indicators  Tightness;Sore    Pain Type  Chronic pain    Pain Onset  More than a month ago    Pain Frequency  Intermittent    Aggravating Factors   talking on the phone (lateral flexion),     Pain Relieving Factors  stretching, heat           [facility] Adult PT Treatment/Exercise - 02/25/17 0001      Neck Exercises: Seated   Other Seated Exercise  seated thoracic extension x 10       Shoulder Exercises: Seated   Horizontal ABduction  Strengthening;Both;10 reps    Theraband Level (Shoulder Horizontal ABduction)  Level 2 (Red)    External Rotation   Strengthening;Both;10 reps    Theraband Level (Shoulder External Rotation)  Level 2 (Red)    Other Seated Exercises  seated dynamic goal post (lower trap) x 10 red band       Shoulder Exercises: ROM/Strengthening   UBE (Upper Arm Bike)  4 min level 1, fatigued , pain incr       Moist Heat Therapy   Number Minutes Moist Heat  15 Minutes    Moist Heat Location  Cervical      Manual Therapy   Manual therapy comments  pin and stretch L side     Soft tissue mobilization  trigger point release L rhomboid, levator scap, mod pressure      Neck Exercises: Stretches   Upper Trapezius Stretch  2 reps;30 seconds    Levator Stretch  2 reps;30 seconds             PT Education - 02/25/17 0929    Education provided  Yes    Education Details  band exercises     Person(s) Educated  Patient    Methods  Explanation    Comprehension  Verbalized understanding;Returned demonstration;Verbal cues required  PT Short Term Goals - 02/25/17 0930      PT SHORT TERM GOAL #1   Title  Pt will be I with initial HEP     Status  Achieved      PT SHORT TERM GOAL #2   Title  Pt will notice less pain with rotation of cervical spine to each side.     Status  Achieved      PT SHORT TERM GOAL #3   Title  Pt will notice pain will become more intermittent and less intense.     Status  Achieved        PT Long Term Goals - 02/25/17 0930      PT LONG TERM GOAL #1   Title  Pt will be able to lift her L arm overhead with only min pain for improved reaching, ADLs    Status  Achieved      PT LONG TERM GOAL #2   Title  Pt will be I with HEP for cervical stab, posture, ROM, UE strength     Status  On-going      PT LONG TERM GOAL #3   Title  Pt will be able to carry items waist height without increasing pain in L UE     Status  Achieved      PT LONG TERM GOAL #4   Title  Pt will demo pain free AROM in cervical spine     Baseline  min pain with L rot and lateral flexion     Status  On-going       PT LONG TERM GOAL #5   Title  Pt will be able to return to the gym without exacerbation of pain, modified routine.     Status  On-going            Plan - 02/25/17 0931    Clinical Impression Statement  Pt improving , notably painful and tender in L upper trap, levator scap and into thoracic paraspinals.  Fatigues quickly with UE exercises.      PT Treatment/Interventions  ADLs/Self Care Home Management;Electrical Stimulation;Functional mobility training;Neuromuscular re-education;Taping;Cryotherapy;Ultrasound;Traction;Moist Heat;Therapeutic exercise;Therapeutic activities;Patient/family education;Manual techniques;Passive range of motion;Dry needling    PT Next Visit Plan  this is her last covered visit but asked for 12 more.  Needs scapular stab, use wall for posture, cont with stretching tight cervicals, dry needle?     PT Home Exercise Plan  cervical retract, scap retact, UT stretch , posture , standing red band: row, extension and horiz pull , thoracic rotation/ extension over chair, lower trap strengthening.     Consulted and Agree with Plan of Care  Patient       Patient will benefit from skilled therapeutic intervention in order to improve the following deficits and impairments:  Decreased mobility, Hypomobility, Obesity, Improper body mechanics, Increased edema, Decreased range of motion, Decreased strength, Increased fascial restricitons, Impaired flexibility, Impaired UE functional use, Postural dysfunction, Pain  Visit Diagnosis: Cervicalgia  Chronic left shoulder pain  Myalgia     Problem List Patient Active Problem List   Diagnosis Date Noted  . Trapezius muscle strain 10/23/2016  . CKD (chronic kidney disease) stage 2, GFR 60-89 ml/min 08/09/2016  . Hypokalemia 06/07/2016  . Symptomatic cholelithiasis 06/05/2016  . BV (bacterial vaginosis) 05/19/2016  . Pap smear for cervical cancer screening 05/17/2016  . Sinus congestion 05/17/2016  . Vitamin D deficiency  03/09/2016  . Dyslipidemia 03/02/2016  . Morbid obesity ([place]) 03/01/2016  . T2DM (type  2 diabetes mellitus) ([place]) 03/01/2016  . HTN (hypertension) 11/17/2015  . Anemia 11/17/2015  . Bilateral thigh pain 11/17/2015  . OSA (obstructive sleep apnea) 12/04/2011  . RLS (restless legs syndrome) 12/04/2011    Kristen Frost 02/25/2017, 2:20 PM  [facility] [address] [place], [place], [address] Phone: [phone]   Fax:  [phone]  Name: ORETHA WEISMANN MRN: [id number] Date of Birth: [birth year]   Raeford Razor, PT 02/25/17 2:20 PM Phone: [phone] Fax: [phone]

## 2017-02-27 ENCOUNTER — Ambulatory Visit: Payer: Medicaid Other | Admitting: Physical Therapy

## 2017-02-27 ENCOUNTER — Encounter: Payer: Self-pay | Admitting: Physical Therapy

## 2017-02-27 DIAGNOSIS — M542 Cervicalgia: Secondary | ICD-10-CM | POA: Diagnosis not present

## 2017-02-27 DIAGNOSIS — G8929 Other chronic pain: Secondary | ICD-10-CM

## 2017-02-27 DIAGNOSIS — M25512 Pain in left shoulder: Secondary | ICD-10-CM

## 2017-02-27 DIAGNOSIS — M791 Myalgia, unspecified site: Secondary | ICD-10-CM

## 2017-02-27 NOTE — Therapy (Signed)
[place], [place], [address] Phone: [phone]   Fax:  [phone]  Physical Therapy Treatment / Re-certification  Patient Details  Name: Kristen Frost MRN: [id number] Date of Birth: 05/06/1955 Referring Provider: Dr. [place]   Encounter Date: 02/27/2017  PT End of Session - 02/27/17 0916    Visit Number  7    Number of Visits  15    Date for PT Re-Evaluation  04/10/17    Authorization Type  MCD     PT Start Time  0846    PT Stop Time  0937    PT Time Calculation (min)  51 min    Activity Tolerance  Patient tolerated treatment well    Behavior During Therapy  [facility] for tasks assessed/performed       Past Medical History:  Diagnosis Date  . Arthritis   . Hypertension   . Restless leg syndrome   . Sleep apnea     Past Surgical History:  Procedure Laterality Date  . CHOLECYSTECTOMY N/A 06/07/2016   Procedure: LAPAROSCOPIC CHOLECYSTECTOMY;  Surgeon: Georganna Skeans, MD;  Location: [place];  Service: General;  Laterality: N/A;  . ROTATOR CUFF REPAIR  2007/2008   left    There were no vitals filed for this visit.  Subjective Assessment - 02/27/17 0848    Subjective  " I was more sore yesterday for some reason, but today I am about a 5/10"     Currently in Pain?  Yes    Pain Score  5     Pain Location  Shoulder    Pain Orientation  Left    Pain Descriptors / Indicators  Tightness    Pain Type  Chronic pain    Pain Onset  More than a month ago    Pain Frequency  Intermittent    Aggravating Factors   unsure    Pain Relieving Factors  massager,          [facility] PT Assessment - 02/27/17 0858      AROM   Left Shoulder Flexion  160 Degrees    Left Shoulder ABduction  150 Degrees    Left Shoulder Internal Rotation  75 Degrees assessed at 90/90    Left Shoulder External Rotation  70 Degrees assessed at 90/90    Cervical Flexion  55    Cervical Extension  40    Cervical - Right Side  Bend  40    Cervical - Left Side Bend  50    Cervical - Right Rotation  70    Cervical - Left Rotation  60      Strength   Left Shoulder Flexion  4+/5    Left Shoulder ABduction  4/5      Palpation   Palpation comment  TTP along L upper trap with tightness multiple knots noted.                   [facility] Adult PT Treatment/Exercise - 02/27/17 0909      Shoulder Exercises: Supine   Other Supine Exercises  supine thoracic / scapular seriess on rolled up towels along the spine ceiling punches, horizontal abduction/ add, alternating ceiling punches, back strok 1 x 15 ea.       Shoulder Exercises: Standing   External Rotation  Strengthening;15 reps;Theraband x 2sets    Internal Rotation  Strengthening;Left;Theraband;12 reps    Theraband Level (Shoulder Internal Rotation)  Level 2 (Red)    Row  Strengthening;Both;15 reps    Theraband Level (Shoulder Row)  Level 3 (Green)      Shoulder Exercises: ROM/Strengthening   UBE (Upper Arm Bike)  L1 x 6 min changing direction at 3 min      Moist Heat Therapy   Number Minutes Moist Heat  10 Minutes    Moist Heat Location  Cervical supine.       Manual Therapy   Soft tissue mobilization  trigger point release L rhomboid, levator scap, mod pressure how to perofrm at home using tool      Neck Exercises: Stretches   Upper Trapezius Stretch  2 reps;30 seconds    Levator Stretch  2 reps;30 seconds             PT Education - 02/27/17 0925    Education provided  Yes    Education Details  review HEP     Person(s) Educated  Patient    Methods  Explanation    Comprehension  Verbalized understanding       PT Short Term Goals - 02/27/17 0908      PT SHORT TERM GOAL #1   Title  Pt will be I with initial HEP     Time  2    Status  Achieved      PT SHORT TERM GOAL #2   Title  Pt will notice less pain with rotation of cervical spine to each side.     Period  Weeks    Status  Achieved      PT SHORT TERM GOAL #3   Title  Pt  will notice pain will become more intermittent and less intense.     Time  2    Period  Weeks    Status  Achieved        PT Long Term Goals - 02/27/17 0908      PT LONG TERM GOAL #1   Title  Pt will be able to lift her L arm overhead with only min pain for improved reaching, ADLs    Time  8    Period  Weeks    Status  Achieved      PT LONG TERM GOAL #2   Title  Pt will be I with HEP for cervical stab, posture, ROM, UE strength     Baseline  ongoing, knows initial HEP     Period  Weeks    Status  On-going      PT LONG TERM GOAL #3   Title  Pt will be able to carry items waist height without increasing pain in L UE     Time  8    Period  Weeks    Status  Achieved      PT LONG TERM GOAL #4   Title  Pt will demo pain free AROM in cervical spine     Baseline  min pain with L rot and lateral flexion     Time  8    Period  Weeks    Status  On-going      PT LONG TERM GOAL #5   Title  Pt will be able to return to the gym without exacerbation of pain, modified routine.     Baseline  has not done since initial injury    Time  8    Period  Weeks    Status  On-going              Plan - 02/27/17 7001  Clinical Impression Statement  [name] reports continued pain inthe R shoulder and neck. she is progressing with physical therapy improving her shoulder/ neck ROM to functional mobility. She continues to demonstrate weakness in the L shoulder and upper trap tightness with a palpable nodule. She is progressing with goals appropriately, and would benefit from continued physical therapy to decrease L shoulder pain and improve functional strength maximizing her function and work toward remaining goals and independent exercise.     PT Frequency  1x / week    PT Duration  6 weeks    PT Treatment/Interventions  ADLs/Self Care Home Management;Electrical Stimulation;Functional mobility training;Neuromuscular re-education;Taping;Cryotherapy;Ultrasound;Traction;Moist Heat;Therapeutic  exercise;Therapeutic activities;Patient/family education;Manual techniques;Passive range of motion;Dry needling    PT Next Visit Plan    Needs scapular stab, use wall for posture, cont with stretching tight cervicals, dry needle PRN,     PT Home Exercise Plan  cervical retract, scap retact, UT stretch , posture , standing red band: row, extension and horiz pull , thoracic rotation/ extension over chair, lower trap strengthening.     Consulted and Agree with Plan of Care  Patient       Patient will benefit from skilled therapeutic intervention in order to improve the following deficits and impairments:  Decreased mobility, Hypomobility, Obesity, Improper body mechanics, Increased edema, Decreased range of motion, Decreased strength, Increased fascial restricitons, Impaired flexibility, Impaired UE functional use, Postural dysfunction, Pain  Visit Diagnosis: Cervicalgia - Plan: PT plan of care cert/re-cert  Chronic left shoulder pain - Plan: PT plan of care cert/re-cert  Myalgia - Plan: PT plan of care cert/re-cert     Problem List Patient Active Problem List   Diagnosis Date Noted  . Trapezius muscle strain 10/23/2016  . CKD (chronic kidney disease) stage 2, GFR 60-89 ml/min 08/09/2016  . Hypokalemia 06/07/2016  . Symptomatic cholelithiasis 06/05/2016  . BV (bacterial vaginosis) 05/19/2016  . Pap smear for cervical cancer screening 05/17/2016  . Sinus congestion 05/17/2016  . Vitamin D deficiency 03/09/2016  . Dyslipidemia 03/02/2016  . Morbid obesity ([place]) 03/01/2016  . T2DM (type 2 diabetes mellitus) ([place]) 03/01/2016  . HTN (hypertension) 11/17/2015  . Anemia 11/17/2015  . Bilateral thigh pain 11/17/2015  . OSA (obstructive sleep apnea) 12/04/2011  . RLS (restless legs syndrome) 12/04/2011   Starr Lake PT, DPT, LAT, ATC  02/27/17  9:32 AM      [facility] [address] [place], [place], [address] Phone:  [phone]   Fax:  [phone]  Name: Kristen Frost MRN: [id number] Date of Birth: July 28, 1955

## 2017-02-28 ENCOUNTER — Ambulatory Visit: Payer: Medicaid Other | Admitting: Internal Medicine

## 2017-02-28 ENCOUNTER — Encounter: Payer: Self-pay | Admitting: Internal Medicine

## 2017-02-28 ENCOUNTER — Other Ambulatory Visit: Payer: Self-pay

## 2017-02-28 VITALS — BP 144/65 | HR 89 | Temp 97.9°F | Ht 61.5 in | Wt 248.2 lb

## 2017-02-28 DIAGNOSIS — E559 Vitamin D deficiency, unspecified: Secondary | ICD-10-CM

## 2017-02-28 DIAGNOSIS — E669 Obesity, unspecified: Secondary | ICD-10-CM | POA: Diagnosis not present

## 2017-02-28 DIAGNOSIS — E785 Hyperlipidemia, unspecified: Secondary | ICD-10-CM

## 2017-02-28 DIAGNOSIS — Z79899 Other long term (current) drug therapy: Secondary | ICD-10-CM | POA: Diagnosis not present

## 2017-02-28 DIAGNOSIS — E119 Type 2 diabetes mellitus without complications: Secondary | ICD-10-CM | POA: Diagnosis present

## 2017-02-28 DIAGNOSIS — Z7984 Long term (current) use of oral hypoglycemic drugs: Secondary | ICD-10-CM

## 2017-02-28 DIAGNOSIS — I10 Essential (primary) hypertension: Secondary | ICD-10-CM

## 2017-02-28 DIAGNOSIS — D649 Anemia, unspecified: Secondary | ICD-10-CM

## 2017-02-28 DIAGNOSIS — Z6841 Body Mass Index (BMI) 40.0 and over, adult: Secondary | ICD-10-CM | POA: Diagnosis not present

## 2017-02-28 LAB — POCT GLYCOSYLATED HEMOGLOBIN (HGB A1C): Hemoglobin A1C: 6.3

## 2017-02-28 LAB — GLUCOSE, CAPILLARY: GLUCOSE-CAPILLARY: 88 mg/dL (ref 65–99)

## 2017-02-28 MED ORDER — AMLODIPINE BESYLATE 10 MG PO TABS
10.0000 mg | ORAL_TABLET | Freq: Every day | ORAL | 5 refills | Status: DC
Start: 2017-02-28 — End: 2017-05-06

## 2017-02-28 NOTE — Patient Instructions (Addendum)
It was a pleasure to meet you today.   START taking Amlodipine 10 mg daily.   STOP the Losartan.   I will call you with the results of your lab work.   I will see you in 3 month.

## 2017-03-01 ENCOUNTER — Encounter: Payer: Self-pay | Admitting: Internal Medicine

## 2017-03-01 LAB — MICROALBUMIN / CREATININE URINE RATIO
Creatinine, Urine: 92.1 mg/dL
Microalb/Creat Ratio: 16 mg/g creat (ref 0.0–30.0)
Microalbumin, Urine: 14.7 ug/mL

## 2017-03-01 NOTE — Assessment & Plan Note (Signed)
Per chart review patient had a 10-year ASCVD score of 16.8%.  She was started on high intensity statin therapy, atorvastatin 40 mg daily.  She has not had a repeat lipid panel in approximately 1 year.  We will repeat her lipid panel today.

## 2017-03-01 NOTE — Progress Notes (Signed)
   CC: Follow-up for her hypertension and diabetes  HPI:  Kristen Frost is a 62 y.o. female with a history of hypertension and diabetes who presented today for continued evaluation and treatment of her chronic illnesses (hypertension and diabetes).  For detailed evaluation and management please refer to problem based charting below.  Past Medical History:  Diagnosis Date  . Arthritis   . Hypertension   . Restless leg syndrome   . Sleep apnea    Review of Systems:   12 point review of system performed.  All negative except for those mentioned in the HPI.  Physical Exam: Vitals:   02/28/17 1355  BP: (!) 144/65  Pulse: 89  Temp: 97.9 F (36.6 C)  TempSrc: Oral  SpO2: 100%  Weight: 248 lb 3.2 oz (112.6 kg)  Height: 5' 1.5" (1.562 m)   General: Obese female in no acute distress Pulm: Good air movement with no wheezing or crackles CV: Regular rate and rhythm with no murmurs or rubs Abdomen: Active bowel sounds, soft, nondistended with no tenderness to palpation Extremities: Warm and dry, no lower extremity edema Neuro: Alert and oriented x3  Assessment & Plan:   See Encounters Tab for problem based charting.  Patient discussed with Dr. Dareen Piano

## 2017-03-01 NOTE — Assessment & Plan Note (Signed)
HPI: Patient's was to be on amlodipine 5 mg and losartan 25 mg daily.  States that she saw the losartan could cause cancer on the news.  This made her very anxious so she stopped taking it.  She has been off the medication for approximately 2 weeks.  She has continued her amlodipine.  She states that she has not had any issues with headaches, visual changes, chest pain, shortness of breath, or abdominal pain.  She is open to trying a different medication but would prefer not to go back on losartan.  She does exercise daily and tries portion control with her meals.  Ultimately she would like to come off all medications.  We discussed that weight loss may help [place] to reduce the number of medications that she is on.  A/P: Discussed with the patient stopping her losartan and increasing her amlodipine to 10 mg daily.  She agrees with these changes.  Encouraged her to continue to exercise daily and work towards weight loss.  She agrees with the plan.

## 2017-03-01 NOTE — Assessment & Plan Note (Signed)
HPI: Patient's A1c is 6.3 today.  She continues to take metformin 1000 mg twice daily without any adverse effects.  She states that she exercises daily and does watch her diet.  She states that over the last couple weeks that she has been under significant amount of stress in her diet has become more relaxed.  Typically she tries to eat healthy food and control her portion sizes.  She has lost about 18 pounds in the past 12 months.  She denies polydipsia, polyuria, polyphagia, fatigue, or any hypoglycemic events.  She does not check her sugars daily.  A/P: Overall patient's diabetes is well controlled.  Encouraged her to continue to take her metformin as prescribed.  Encouraged her to continue to work towards weight loss.  She has done an excellent job losing 18 pounds in the past 12 months.

## 2017-03-01 NOTE — Assessment & Plan Note (Signed)
Patient has a known history of vitamin D deficiency.  We will recheck her vitamin D levels today.

## 2017-03-01 NOTE — Assessment & Plan Note (Signed)
Per chart review patient has a history of normocytic anemia with high RDW.  We will check labs to determine whether iron replacement is necessary.

## 2017-03-01 NOTE — Progress Notes (Signed)
Internal Medicine Clinic Attending  Case discussed with Dr. Helberg at the time of the visit.  We reviewed the resident's history and exam and pertinent patient test results.  I agree with the assessment, diagnosis, and plan of care documented in the resident's note.    

## 2017-03-01 NOTE — Addendum Note (Signed)
Addended by: Aldine Contes on: 03/01/2017 12:17 PM   Modules accepted: Level of Service

## 2017-03-04 LAB — IRON AND TIBC
IRON SATURATION: 15 % (ref 15–55)
Iron: 53 ug/dL (ref 27–139)
TIBC: 342 ug/dL (ref 250–450)
UIBC: 289 ug/dL (ref 118–369)

## 2017-03-04 LAB — LIPID PANEL
CHOL/HDL RATIO: 2.7 ratio (ref 0.0–4.4)
Cholesterol, Total: 140 mg/dL (ref 100–199)
HDL: 52 mg/dL (ref >39)
LDL CALC: 71 mg/dL (ref 0–99)
Triglycerides: 85 mg/dL (ref 0–149)
VLDL CHOLESTEROL CAL: 17 mg/dL (ref 5–40)

## 2017-03-04 LAB — VITAMIN D 25 HYDROXY (VIT D DEFICIENCY, FRACTURES): Vit D, 25-Hydroxy: 27.4 ng/mL — ABNORMAL LOW (ref 30.0–100.0)

## 2017-03-04 LAB — FERRITIN: Ferritin: 52 ng/mL (ref 15–150)

## 2017-03-11 ENCOUNTER — Ambulatory Visit: Payer: Medicaid Other | Admitting: Physical Therapy

## 2017-03-11 ENCOUNTER — Encounter: Payer: Self-pay | Admitting: Physical Therapy

## 2017-03-11 DIAGNOSIS — M542 Cervicalgia: Secondary | ICD-10-CM

## 2017-03-11 DIAGNOSIS — M25512 Pain in left shoulder: Secondary | ICD-10-CM

## 2017-03-11 DIAGNOSIS — G8929 Other chronic pain: Secondary | ICD-10-CM

## 2017-03-11 DIAGNOSIS — M791 Myalgia, unspecified site: Secondary | ICD-10-CM

## 2017-03-11 NOTE — Therapy (Signed)
[place], [place], [address] Phone: [phone]   Fax:  [phone]  Physical Therapy Treatment/ Re-evaluation and Discharge summary  Patient Details  Name: Kristen Frost MRN: [id number] Date of Birth: 1955-05-18 Referring Provider: Dr. [place]   Encounter Date: 03/11/2017  PT End of Session - 03/11/17 0926    Visit Number  8    Number of Visits  15    Date for PT Re-Evaluation  04/10/17    Authorization Type  MCD     PT Start Time  0845    PT Stop Time  0920    PT Time Calculation (min)  35 min    Activity Tolerance  Patient tolerated treatment well    Behavior During Therapy  [facility] for tasks assessed/performed       Past Medical History:  Diagnosis Date  . Arthritis   . Hypertension   . Restless leg syndrome   . Sleep apnea     Past Surgical History:  Procedure Laterality Date  . CHOLECYSTECTOMY N/A 06/07/2016   Procedure: LAPAROSCOPIC CHOLECYSTECTOMY;  Surgeon: Georganna Skeans, MD;  Location: [facility];  Service: General;  Laterality: N/A;  . ROTATOR CUFF REPAIR  2007/2008   left    There were no vitals filed for this visit.  Subjective Assessment - 03/11/17 0850    Subjective  " I feel like things have gotten much better since the last session, I haven't gotten the Theracane but plan to go buy one today"     Currently in Pain?  Yes    Pain Score  4     Pain Orientation  Left    Pain Descriptors / Indicators  Stabbing    Pain Type  Chronic pain    Pain Onset  More than a month ago    Pain Frequency  Intermittent    Aggravating Factors   N/A    Pain Relieving Factors  massager         [facility] PT Assessment - 03/11/17 0851      Assessment   Medical Diagnosis  L trapezius strain      AROM   Left Shoulder Flexion  162 Degrees    Left Shoulder ABduction  150 Degrees    Left Shoulder Internal Rotation  76 Degrees    Left Shoulder External Rotation  65 Degrees    Cervical  Flexion  75    Cervical Extension  50    Cervical - Right Side Bend  52    Cervical - Left Side Bend  50    Cervical - Right Rotation  73    Cervical - Left Rotation  64      Strength   Left Shoulder Flexion  4+/5    Left Shoulder ABduction  4+/5                  [facility] Adult PT Treatment/Exercise - 03/11/17 0905      Neck Exercises: Seated   Neck Retraction  5 secs;5 reps    Other Seated Exercise  lower trap strengthening 2 x 10 with elbows on bolster using yellow theraband    Other Seated Exercise  seated thoracic rotation / extension x 10       Manual Therapy   Manual therapy comments  sub-occipital release using tennis balls how to perform at home    Soft tissue mobilization  trigger point release levator scap,      Neck Exercises: Stretches  Upper Trapezius Stretch  2 reps;30 seconds    Levator Stretch  2 reps;30 seconds             PT Education - 03/11/17 0925    Education provided  Yes    Education Details  reviewed previously provided HEP, how to progress strength by increasing reps/ sets gradually, continued with good posture and resetting when she returns to poor posture. reviewed MTPR techniques.     Person(s) Educated  Patient    Methods  Explanation;Verbal cues;Handout    Comprehension  Verbalized understanding;Verbal cues required       PT Short Term Goals - 03/11/17 0855      PT SHORT TERM GOAL #1   Title  Pt will be I with initial HEP     Time  2    Period  Weeks    Status  Achieved      PT SHORT TERM GOAL #2   Title  Pt will notice less pain with rotation of cervical spine to each side.     Time  2    Period  Weeks    Status  Achieved      PT SHORT TERM GOAL #3   Title  Pt will notice pain will become more intermittent and less intense.     Time  2    Period  Weeks    Status  Achieved        PT Long Term Goals - 03/11/17 0856      PT LONG TERM GOAL #1   Title  Pt will be able to lift her L arm overhead with only min  pain for improved reaching, ADLs    Period  Weeks    Status  Achieved      PT LONG TERM GOAL #2   Title  Pt will be I with HEP for cervical stab, posture, ROM, UE strength     Time  8    Period  Weeks    Status  Achieved      PT LONG TERM GOAL #3   Title  Pt will be able to carry items waist height without increasing pain in L UE     Time  8    Period  Weeks    Status  Achieved      PT LONG TERM GOAL #4   Title  Pt will demo pain free AROM in cervical spine     Time  8    Period  Weeks    Status  Achieved      PT LONG TERM GOAL #5   Title  Pt will be able to return to the gym without exacerbation of pain, modified routine.     Baseline  pt reports she hasn't returned due to she goes with her daughter, but feels may not be able to return just yet     Time  8    Period  Weeks    Status  Partially Met            Plan - 03/11/17 3570    Clinical Impression Statement  pt reports significant relief since the last few sessions. reviewed previously provided HEP which she  performed well. she reports intermittent tightness and sorness which she is able to reduce with exercise/ stretching and Mtpr techniques.  pt met or partially met all goals today and is able to maintain / progress her current level of function independently and will be discharged from PT today.  PT Next Visit Plan  d/C    PT Home Exercise Plan  cervical retract, scap retact, UT stretch , posture , standing red band: row, extension and horiz pull , thoracic rotation/ extension over chair, lower trap strengthening in sitting/ standing.     Consulted and Agree with Plan of Care  Patient       Patient will benefit from skilled therapeutic intervention in order to improve the following deficits and impairments:     Visit Diagnosis: Cervicalgia  Chronic left shoulder pain  Myalgia     Problem List Patient Active Problem List   Diagnosis Date Noted  . Trapezius muscle strain 10/23/2016  . CKD (chronic  kidney disease) stage 2, GFR 60-89 ml/min 08/09/2016  . Hypokalemia 06/07/2016  . Symptomatic cholelithiasis 06/05/2016  . BV (bacterial vaginosis) 05/19/2016  . Pap smear for cervical cancer screening 05/17/2016  . Sinus congestion 05/17/2016  . Vitamin D deficiency 03/09/2016  . Dyslipidemia 03/02/2016  . Morbid obesity ([place]) 03/01/2016  . T2DM (type 2 diabetes mellitus) ([place]) 03/01/2016  . HTN (hypertension) 11/17/2015  . Anemia 11/17/2015  . Bilateral thigh pain 11/17/2015  . OSA (obstructive sleep apnea) 12/04/2011  . RLS (restless legs syndrome) 12/04/2011   Starr Lake PT, DPT, LAT, ATC  03/11/17  9:29 AM      [facility] [address] [place], [place], [address] Phone: [phone]   Fax:  [phone]  Name: Kristen Frost MRN: [id number] Date of Birth: 1955-10-04      PHYSICAL THERAPY DISCHARGE SUMMARY  Visits from Start of Care: 8  Current functional level related to goals / functional outcomes: See goals   Remaining deficits: See above assessment   Education / Equipment: HEP, theraband, posture, lifting mechanics, manual release techniques  Plan: Patient agrees to discharge.  Patient goals were partially met. Patient is being discharged due to being pleased with the current functional level.  ?????          Jeff Frieden PT, DPT, LAT, ATC  03/11/17  9:30 AM

## 2017-03-18 ENCOUNTER — Encounter: Payer: Medicaid Other | Admitting: Physical Therapy

## 2017-03-20 ENCOUNTER — Other Ambulatory Visit: Payer: Self-pay | Admitting: *Deleted

## 2017-03-21 MED ORDER — ATORVASTATIN CALCIUM 40 MG PO TABS: 40.0000 mg | ORAL_TABLET | Freq: Every day | ORAL | 11 refills | Status: DC

## 2017-03-25 ENCOUNTER — Encounter: Payer: Medicaid Other | Admitting: Physical Therapy

## 2017-04-01 ENCOUNTER — Encounter: Payer: Medicaid Other | Admitting: Physical Therapy

## 2017-04-11 ENCOUNTER — Encounter: Payer: Medicaid Other | Admitting: Internal Medicine

## 2017-05-06 ENCOUNTER — Other Ambulatory Visit: Payer: Self-pay | Admitting: Internal Medicine

## 2017-05-06 ENCOUNTER — Encounter: Payer: Self-pay | Admitting: Internal Medicine

## 2017-05-06 ENCOUNTER — Ambulatory Visit: Payer: Medicaid Other | Admitting: Internal Medicine

## 2017-05-06 ENCOUNTER — Other Ambulatory Visit: Payer: Self-pay

## 2017-05-06 VITALS — BP 123/59 | HR 87 | Temp 98.0°F | Ht 61.5 in | Wt 247.1 lb

## 2017-05-06 DIAGNOSIS — Z6841 Body Mass Index (BMI) 40.0 and over, adult: Secondary | ICD-10-CM | POA: Diagnosis not present

## 2017-05-06 DIAGNOSIS — Z7984 Long term (current) use of oral hypoglycemic drugs: Secondary | ICD-10-CM | POA: Diagnosis not present

## 2017-05-06 DIAGNOSIS — I10 Essential (primary) hypertension: Secondary | ICD-10-CM

## 2017-05-06 DIAGNOSIS — Z79899 Other long term (current) drug therapy: Secondary | ICD-10-CM | POA: Diagnosis not present

## 2017-05-06 DIAGNOSIS — D509 Iron deficiency anemia, unspecified: Secondary | ICD-10-CM

## 2017-05-06 DIAGNOSIS — E119 Type 2 diabetes mellitus without complications: Secondary | ICD-10-CM | POA: Diagnosis not present

## 2017-05-06 DIAGNOSIS — E785 Hyperlipidemia, unspecified: Secondary | ICD-10-CM

## 2017-05-06 DIAGNOSIS — E559 Vitamin D deficiency, unspecified: Secondary | ICD-10-CM

## 2017-05-06 DIAGNOSIS — R0981 Nasal congestion: Secondary | ICD-10-CM

## 2017-05-06 MED ORDER — ATORVASTATIN CALCIUM 40 MG PO TABS: 40.0000 mg | ORAL_TABLET | Freq: Every day | ORAL | 1 refills | Status: DC

## 2017-05-06 MED ORDER — VITAMIN D 50 MCG (2000 UT) PO CAPS: 2000.0000 [IU] | ORAL_CAPSULE | Freq: Every day | ORAL | 1 refills | Status: AC

## 2017-05-06 MED ORDER — METFORMIN HCL 1000 MG PO TABS: ORAL_TABLET | ORAL | 1 refills | Status: DC

## 2017-05-06 MED ORDER — FERROUS SULFATE 325 (65 FE) MG PO TABS: 325.0000 mg | ORAL_TABLET | Freq: Every day | ORAL | 1 refills | Status: DC

## 2017-05-06 MED ORDER — AMLODIPINE BESYLATE 10 MG PO TABS: 10.0000 mg | ORAL_TABLET | Freq: Every day | ORAL | 1 refills | Status: DC

## 2017-05-06 MED ORDER — FLUTICASONE PROPIONATE 50 MCG/ACT NA SUSP: 1.0000 | Freq: Every day | NASAL | 0 refills | Status: DC

## 2017-05-06 NOTE — Progress Notes (Signed)
   CC: Follow-up on her morbid obesity   HPI:  Kristen Frost is a 62 y.o. female who presented to the clinic for continued evaluation and management of her chronic medical illnesses. For a detailed evaluation and management plan please refer to problem based charting.   Past Medical History:  Diagnosis Date  . Arthritis   . Hypertension   . Restless leg syndrome   . Sleep apnea    Review of Systems:  Denies chest pain, SOB  Denies abdominal pain, N/V  Physical Exam: Vitals:   05/06/17 1321  BP: (!) 123/59  Pulse: 87  Temp: 98 F (36.7 C)  TempSrc: Oral  SpO2: 100%  Weight: 247 lb 1.6 oz (112.1 kg)  Height: 5' 1.5" (1.562 m)   General: Obese female in no acute distress Pulm: Good air movement with no wheezing or crackles  CV: RRR, no murmurs, no rubs  Abdomen: Active bowel sounds, soft, non-distended.  Assessment & Plan:   See Encounters Tab for problem based charting.  Patient discussed with Dr. Dareen Piano

## 2017-05-06 NOTE — Assessment & Plan Note (Addendum)
Well controlled on metformin 1000 mg BID. Doing well overall. Starting to exercise more consistently. Working on weight loss. Refill for metformin sent.

## 2017-05-06 NOTE — Assessment & Plan Note (Signed)
Symptoms well controlled with daily Flonase. Refill sent.

## 2017-05-06 NOTE — Progress Notes (Signed)
Internal Medicine Clinic Attending  Case discussed with Dr. Helberg at the time of the visit.  We reviewed the resident's history and exam and pertinent patient test results.  I agree with the assessment, diagnosis, and plan of care documented in the resident's note.    

## 2017-05-06 NOTE — Assessment & Plan Note (Signed)
Reviewed previous labs. Recent Vitamin D at 27.5. Given prescription for 2000 units vitamin D daily. Recheck at next visit.

## 2017-05-06 NOTE — Patient Instructions (Signed)
Thank you for allowing [place] to provide your care. I have sent out all your prescriptions.   Keep up the great work with the exercise and weight loss!

## 2017-05-06 NOTE — Assessment & Plan Note (Signed)
Patient working on weight loss. She has lost 5 lbs recently. She is exercising more consistently and has noticed her clothes are not as tight. I encouraged her to continue to work towards weight loss.

## 2017-05-06 NOTE — Assessment & Plan Note (Signed)
Discussed previous iron testing. Iron saturation and ferritin within normal limits. She is continuing to take daily iron without adverse effects. Refill sent.

## 2017-05-06 NOTE — Assessment & Plan Note (Signed)
Reviewed previous labs. LDL at 77. Continuing atorvastatin. Refill sent.

## 2017-05-06 NOTE — Assessment & Plan Note (Signed)
Well controlled today. Currently on Amlodipine 10 mg QD. Tolerating the medications without side effects. Refill for amlodipine sent.

## 2017-07-27 IMAGING — MR MR 3D RECON AT SCANNER
20 of 27 series · 20 of 27 positions shown · IV contrast (Yes MH)
Comparison: Ultrasound on 06/05/2016

CLINICAL DATA: Right upper quadrant abdominal pain, vomiting, and
diarrhea for several days. Cholelithiasis and biliary ductal
dilatation seen on recent ultrasound.

EXAM:
MRI ABDOMEN WITHOUT AND WITH CONTRAST (INCLUDING MRCP)
TECHNIQUE: Multiplanar multisequence MR imaging of the abdomen was performed
both before and after the administration of intravenous contrast.
Heavily T2-weighted images of the biliary and pancreatic ducts were
obtained, and three-dimensional MRCP images were rendered by post
processing.
CONTRAST:  20mL MULTIHANCE GADOBENATE DIMEGLUMINE 529 MG/ML IV SOLN

[Series 3: cor ssfse rt · coronal · 6.0mm · 0.88mm/px · 1 of 37 slices shown]
[im 1/37]
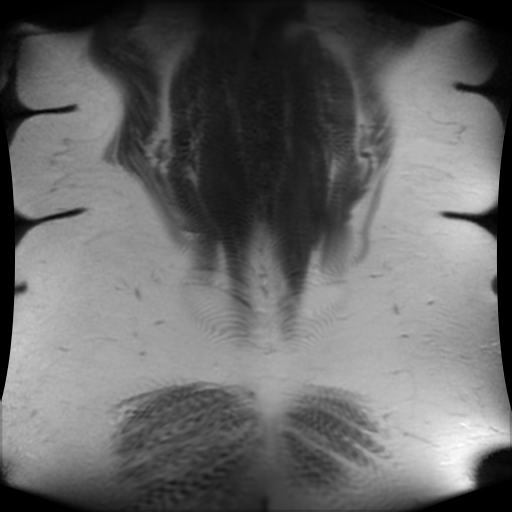

[Series 8: MRCP · coronal · 1.8mm · 0.62mm/px · 1 of 92 slices shown]
[im 1/92]
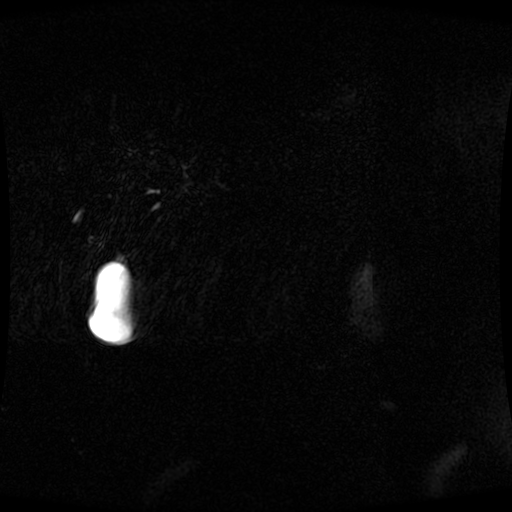

[Series 9: DWI b500 · axial · 8.0mm · 1.95mm/px · 1 of 48 slices shown]
[im 1/48]
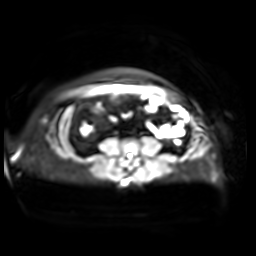

[Series 10: radial 2d thick · oblique · 50.0mm · 0.74mm/px · 1 of 6 slices shown]
[im 1/6]
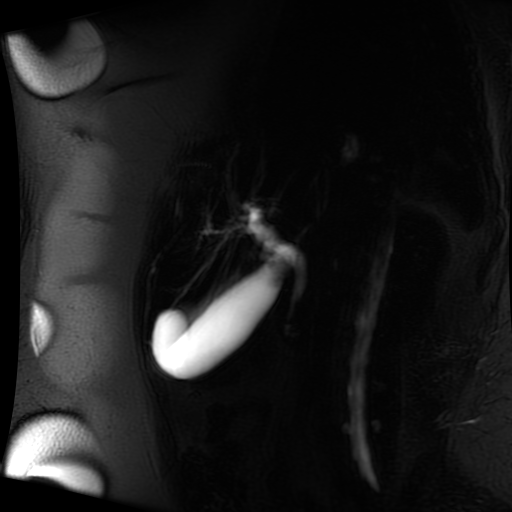

[Series 12: T2 fat-sat · axial · 6.0mm · 0.86mm/px · 1 of 40 slices shown]
[im 1/40]
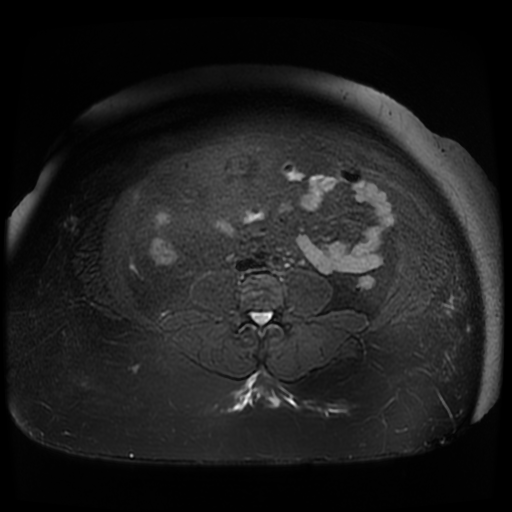

[Series 14: bSSFP · coronal · 6.0mm · 0.86mm/px · 1 of 42 slices shown]
[im 1/42]
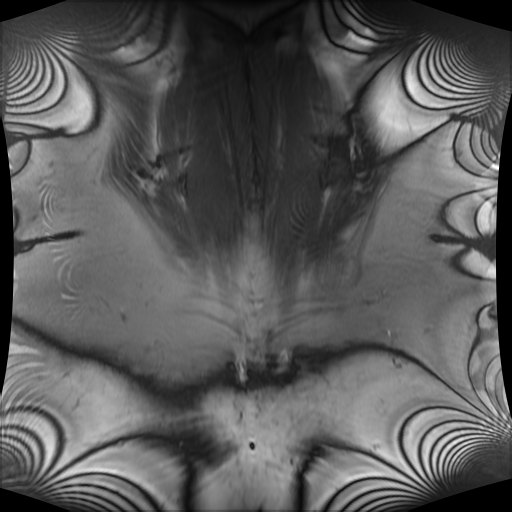

[Series 16: T1 dynamic · coronal · 4.0mm · 0.88mm/px · 1 of 60 slices shown (1 of 3)]
[im 1/60]
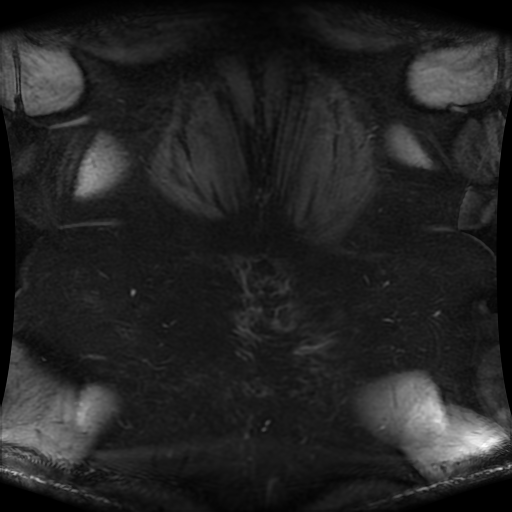

[Series 950: ADC · axial · 8.0mm · 1.95mm/px · 1 of 24 slices shown]
[im 1/24]
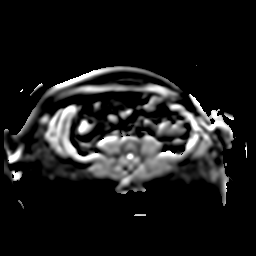

[Series 1301: T1 dynamic · axial · 4.0mm · 0.86mm/px · 1 of 66 slices shown (2 of 3)]
[im 1/66]
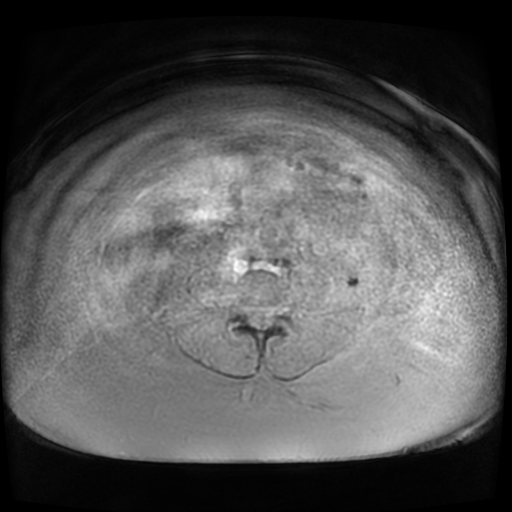

[Series 1302: T1 dynamic · axial · 4.0mm · 0.86mm/px · 1 of 66 slices shown (3 of 3)]
[im 1/66]
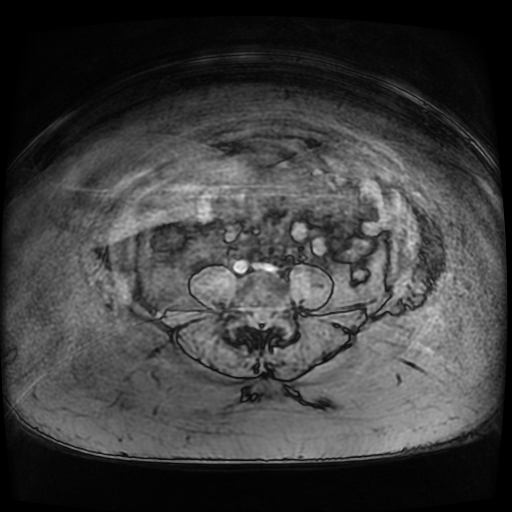

[Series 1501: T1 dynamic post-contrast · axial · non-contrast · 4.0mm · 0.86mm/px · 1 of 64 slices shown (1 of 7)]
[im 1/64]
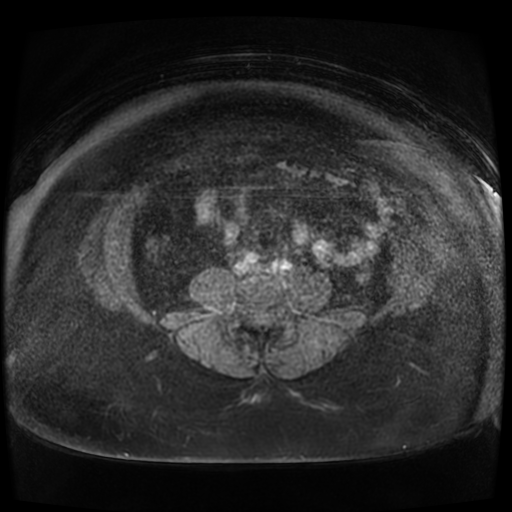

[Series 1502: T1 dynamic post-contrast · axial · non-contrast · 4.0mm · 0.86mm/px · 1 of 64 slices shown (2 of 7)]
[im 1/64]
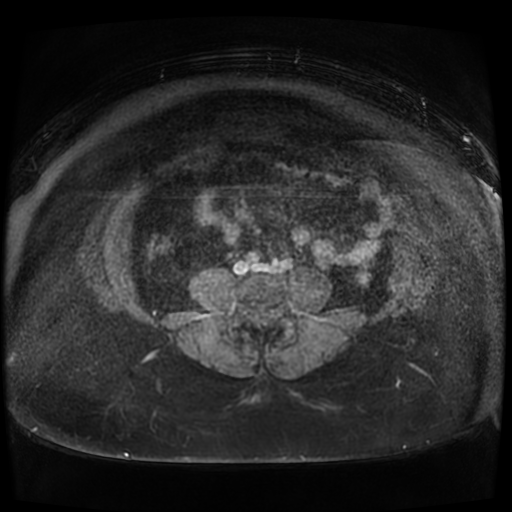

[Series 1503: T1 dynamic post-contrast · axial · non-contrast · 4.0mm · 0.86mm/px · 1 of 64 slices shown (3 of 7)]
[im 1/64]
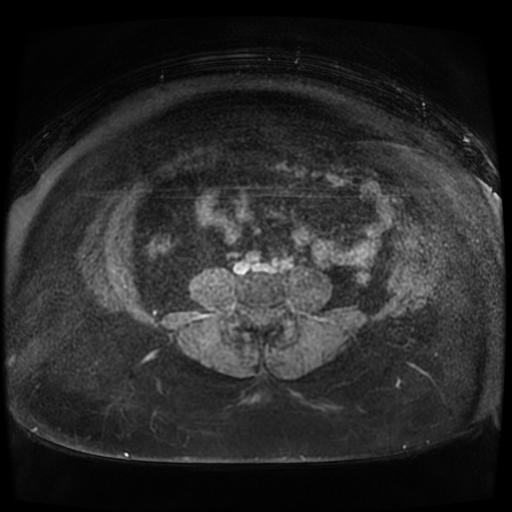

[((id)/(id)/1)-((id)/(id)/1) · axial · 4.0mm · 0.86mm/px · 1 of 64 slices shown (1 of 3)]
[im 1/64]
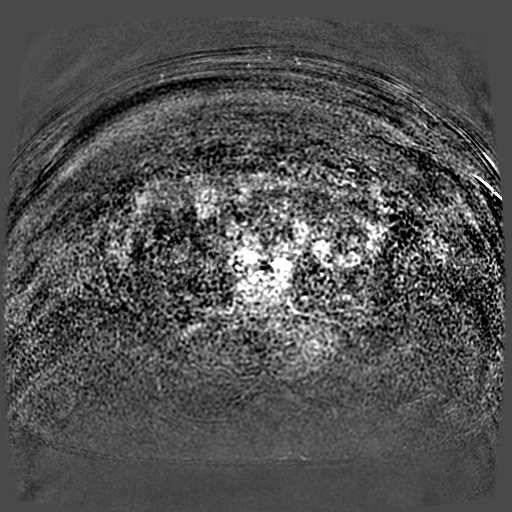

[((id)/(id)/1)-((id)/(id)/1) · axial · 4.0mm · 0.86mm/px · 1 of 64 slices shown (2 of 3)]
[im 1/64]
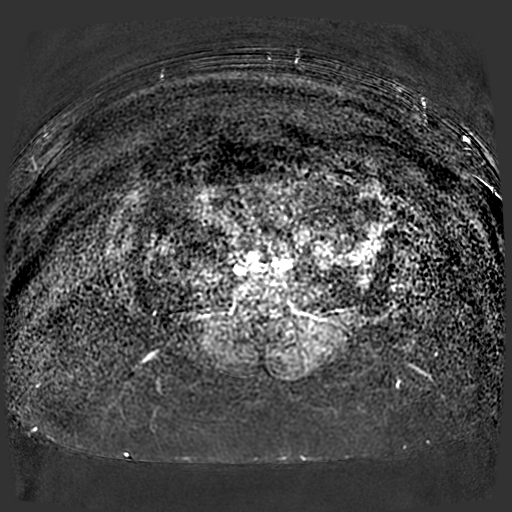

[((id)/(id)/1)-((id)/(id)/1) · axial · 4.0mm · 0.86mm/px · 1 of 64 slices shown (3 of 3)]
[im 1/64]
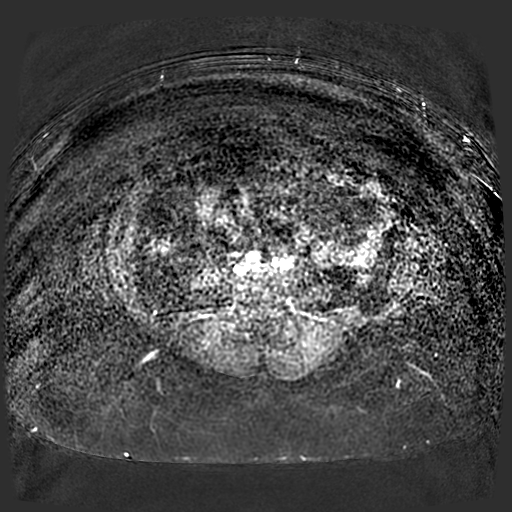

[T1 dynamic post-contrast · axial · non-contrast · 4.0mm · 0.86mm/px · 1 of 64 slices shown (4 of 7)]
[im 1/64]
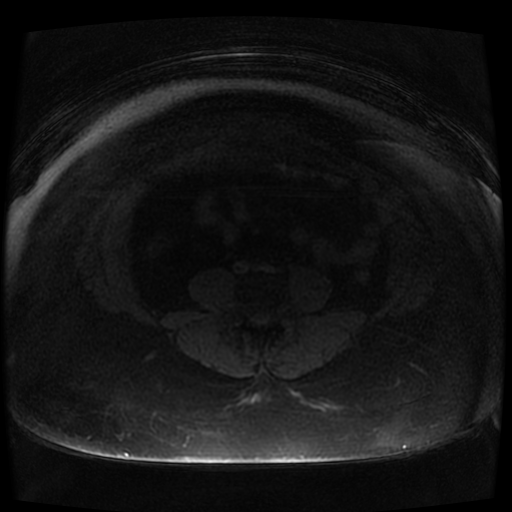

[T1 dynamic post-contrast · axial · non-contrast · 4.0mm · 0.86mm/px · 1 of 64 slices shown (5 of 7)]
[im 1/64]
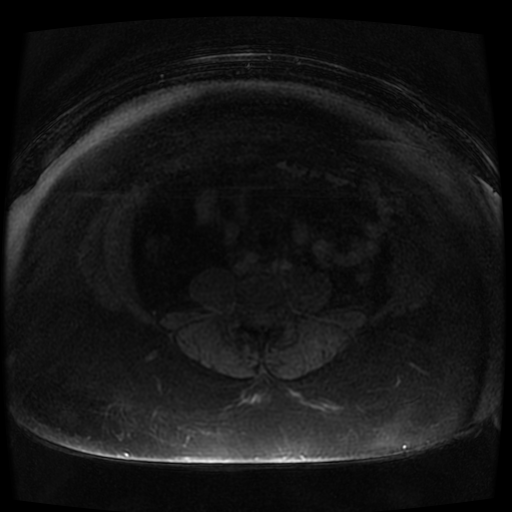

[T1 dynamic post-contrast · axial · non-contrast · 4.0mm · 0.86mm/px · 1 of 64 slices shown (6 of 7)]
[im 1/64]
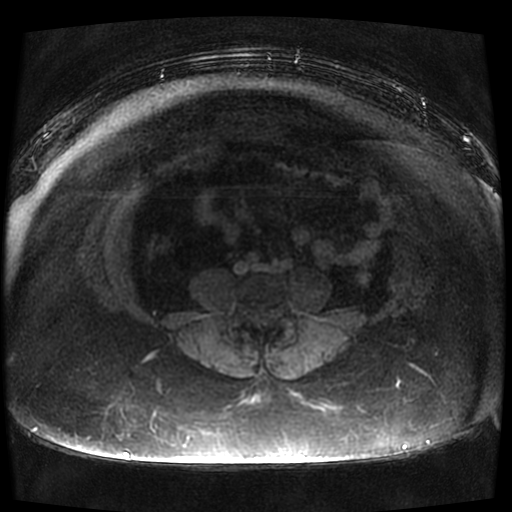

[T1 dynamic post-contrast · axial · non-contrast · 4.0mm · 0.86mm/px · 1 of 64 slices shown (7 of 7)]
[im 1/64]
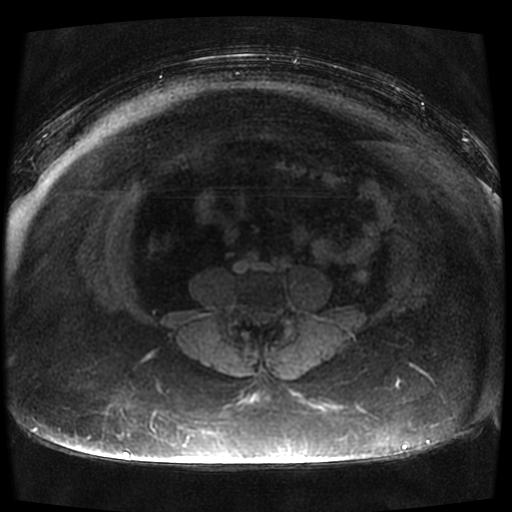

[20 of 27 positions shown; findings below may reference images not displayed]

FINDINGS: Lower chest: Bibasilar atelectasis noted.

Hepatobiliary: No masses identified. Mild diffuse hepatic steatosis.
Gallbladder is mildly distended with several tiny gallstones in the
gallbladder neck, however there is no evidence of significant
gallbladder wall thickening or pericholecystic inflammatory changes.

Mild diffuse biliary ductal dilatation is seen, with common bile
duct measuring 12 mm. Smooth tapering of the distal common bile duct
is seen, without evidence of choledocholithiasis.

Pancreas: No mass or inflammatory changes. No evidence of pancreatic
ductal dilatation or pancreas divisum.

Spleen:  Within normal limits in size and appearance.

Adrenals/Urinary Tract: No masses identified. No evidence of
hydronephrosis.

Stomach/Bowel: Small hiatal hernia.  Otherwise unremarkable.

Vascular/Lymphatic: No pathologically enlarged lymph nodes
identified. No abdominal aortic aneurysm.

Other:  None.

Musculoskeletal:  No suspicious bone lesions identified.
IMPRESSION: Distended gallbladder with tiny gallstones. No other definite signs
of acute cholecystitis.

Mild diffuse biliary ductal dilatation, with common bile duct
measuring 12 mm. No choledocholithiasis or other obstructing
etiology visualized.

Mild diffuse hepatic steatosis.  No liver mass identified.

Small hiatal hernia.

Bibasilar atelectasis.

## 2017-11-07 ENCOUNTER — Other Ambulatory Visit: Payer: Self-pay

## 2017-11-07 ENCOUNTER — Encounter: Payer: Self-pay | Admitting: Internal Medicine

## 2017-11-07 ENCOUNTER — Ambulatory Visit: Payer: Medicaid Other | Admitting: Internal Medicine

## 2017-11-07 VITALS — BP 130/73 | HR 85 | Temp 97.9°F | Ht 61.5 in | Wt 250.0 lb

## 2017-11-07 DIAGNOSIS — Z6841 Body Mass Index (BMI) 40.0 and over, adult: Secondary | ICD-10-CM

## 2017-11-07 DIAGNOSIS — N182 Chronic kidney disease, stage 2 (mild): Secondary | ICD-10-CM | POA: Diagnosis not present

## 2017-11-07 DIAGNOSIS — I129 Hypertensive chronic kidney disease with stage 1 through stage 4 chronic kidney disease, or unspecified chronic kidney disease: Secondary | ICD-10-CM

## 2017-11-07 DIAGNOSIS — I10 Essential (primary) hypertension: Secondary | ICD-10-CM

## 2017-11-07 DIAGNOSIS — Z7984 Long term (current) use of oral hypoglycemic drugs: Secondary | ICD-10-CM | POA: Diagnosis not present

## 2017-11-07 DIAGNOSIS — J069 Acute upper respiratory infection, unspecified: Secondary | ICD-10-CM

## 2017-11-07 DIAGNOSIS — Z79899 Other long term (current) drug therapy: Secondary | ICD-10-CM | POA: Diagnosis not present

## 2017-11-07 DIAGNOSIS — E1122 Type 2 diabetes mellitus with diabetic chronic kidney disease: Secondary | ICD-10-CM | POA: Diagnosis not present

## 2017-11-07 DIAGNOSIS — E119 Type 2 diabetes mellitus without complications: Secondary | ICD-10-CM

## 2017-11-07 LAB — POCT GLYCOSYLATED HEMOGLOBIN (HGB A1C): Hemoglobin A1C: 6.2 % — AB (ref 4.0–5.6)

## 2017-11-07 LAB — GLUCOSE, CAPILLARY: GLUCOSE-CAPILLARY: 81 mg/dL (ref 70–99)

## 2017-11-07 MED ORDER — METFORMIN HCL ER 500 MG PO TB24: 1000.0000 mg | ORAL_TABLET | Freq: Every day | ORAL | 1 refills | Status: DC

## 2017-11-07 NOTE — Assessment & Plan Note (Addendum)
DM is well controlled on metformin 1000 mg BID. Last A1c on 02/28/2017 was 6.2, and repeat today is 6.3. She is not checking her sugars daily. Her weight is stable since the last visit and over the past 12 months. She is having some N/V with the metformin so we will switch her to Glucophage XR 1000 mg QD. Recommended she continue to work on weight loss and exercise. She is currently on a statin and not on an ACE/ARB. Last microalbumin was WNL.

## 2017-11-07 NOTE — Assessment & Plan Note (Signed)
HTN well controlled on Amlodipine 10 mg QD. She is not experiencing any adverse effects secondary to the medication including constipation and LE edema. We will continue her current regimen without changes. Encouraged her to continue to work on weight loss and exercise.

## 2017-11-07 NOTE — Patient Instructions (Addendum)
Thank you for allowing [place] to provide your care. Continue to monitor your fatigue and muscle pain. If things get worse please come back to see me.   For the metformin we will switch you to the extended release 1000 mg once a day. This is typically well tolerated and does not cause as many side effects.   For your obesity I think that weight watchers is a great idea. Continue to work on exercise and weight loss, this ultimately could help you get off some of your medications.   I will see you back in 3 months or sooner if any issues arise. Have a great day!

## 2017-11-07 NOTE — Assessment & Plan Note (Addendum)
Patient's weight is stable around 250lbs since last visit and over past 12 months. Encouraged her to exercise and work on weight loss. Ultimately this may help her to come off medications. She is interested in [employer] and pursuing options to get involved.

## 2017-11-07 NOTE — Progress Notes (Signed)
   CC: Hypertension   HPI:  [name] is a 62 y.o. female who presented to the clinic for continued evaluation and management of her chronic medical illnesses. For a detailed assessment and plan please refer to problem based charting below.   Past Medical History:  Diagnosis Date  . Arthritis   . Bilateral thigh pain 11/17/2015  . BV (bacterial vaginosis) 05/19/2016  . Hypertension   . Hypokalemia 06/07/2016  . Pap smear for cervical cancer screening 05/17/2016  . Restless leg syndrome   . Sinus congestion 05/17/2016  . Sleep apnea   . Symptomatic cholelithiasis 06/05/2016  . Trapezius muscle strain 10/23/2016   Review of Systems:  12 point ROS preformed. All negative aside from those mentioned in the HPI.  Physical Exam: Vitals:   11/07/17 1330  BP: 130/73  Pulse: 85  Temp: 97.9 F (36.6 C)  TempSrc: Oral  Weight: 250 lb (113.4 kg)   General: Obese female in no acute distress Pulm: Good air movement with no wheezing or crackles  CV: RRR, no murmurs, no rubs  Abdomen: Soft, non-distended, no tenderness to palpation  Extremities: No LE edema   Assessment & Plan:   See Encounters Tab for problem based charting.  Patient discussed with Dr. Dareen Piano

## 2017-11-07 NOTE — Assessment & Plan Note (Addendum)
Patient doing well without any uremic symptoms. We will recheck her renal function today. Advised to avoid prolonged NSAIDs including; ibuprofen, aleve, mortin, goody powder, BC powder.

## 2017-11-07 NOTE — Assessment & Plan Note (Signed)
Patient feelign fatigued and muscle aches for the past 1 weeks. She has not tired any medication. Multiple family members. Feels like she gets better then gets something from a family member. She is not having cough, rhinorrhea, nasal congestion, abdominal pain, diarrhea, sore throat, ear pain, dysuria. On PE there is no erythema of the oropharynx, TM are clear with good light reflex bilaterally. No LAD. Lungs are clear to ausculation. Recommended symptomatic management. Advised to return if symptoms progress and she becomes febrile, develops a cough, SOB. Advised on good hand hygiene.

## 2017-11-08 LAB — BMP8+ANION GAP
Anion Gap: 14 mmol/L (ref 10.0–18.0)
BUN / CREAT RATIO: 11 — AB (ref 12–28)
BUN: 11 mg/dL (ref 8–27)
CALCIUM: 9.7 mg/dL (ref 8.7–10.3)
CHLORIDE: 105 mmol/L (ref 96–106)
CO2: 25 mmol/L (ref 20–29)
Creatinine, Ser: 1 mg/dL (ref 0.57–1.00)
GFR calc Af Amer: 70 mL/min/{1.73_m2} (ref >59)
GFR calc non Af Amer: 61 mL/min/{1.73_m2} (ref >59)
GLUCOSE: 90 mg/dL (ref 65–99)
POTASSIUM: 4.4 mmol/L (ref 3.5–5.2)
SODIUM: 144 mmol/L (ref 134–144)

## 2017-11-11 NOTE — Progress Notes (Signed)
Internal Medicine Clinic Attending  Case discussed with Dr. Helberg at the time of the visit.  We reviewed the resident's history and exam and pertinent patient test results.  I agree with the assessment, diagnosis, and plan of care documented in the resident's note.    

## 2017-12-13 ENCOUNTER — Other Ambulatory Visit: Payer: Self-pay | Admitting: Internal Medicine

## 2017-12-13 DIAGNOSIS — I10 Essential (primary) hypertension: Secondary | ICD-10-CM

## 2018-02-03 ENCOUNTER — Encounter: Payer: Self-pay | Admitting: Internal Medicine

## 2018-02-03 ENCOUNTER — Ambulatory Visit: Payer: Medicaid Other | Admitting: Internal Medicine

## 2018-02-03 ENCOUNTER — Other Ambulatory Visit: Payer: Self-pay

## 2018-02-03 VITALS — BP 133/65 | HR 96 | Temp 98.6°F | Wt 255.0 lb

## 2018-02-03 DIAGNOSIS — Z79899 Other long term (current) drug therapy: Secondary | ICD-10-CM | POA: Diagnosis not present

## 2018-02-03 DIAGNOSIS — Z7984 Long term (current) use of oral hypoglycemic drugs: Secondary | ICD-10-CM

## 2018-02-03 DIAGNOSIS — K649 Unspecified hemorrhoids: Secondary | ICD-10-CM

## 2018-02-03 DIAGNOSIS — E119 Type 2 diabetes mellitus without complications: Secondary | ICD-10-CM

## 2018-02-03 DIAGNOSIS — M25551 Pain in right hip: Secondary | ICD-10-CM | POA: Insufficient documentation

## 2018-02-03 DIAGNOSIS — D509 Iron deficiency anemia, unspecified: Secondary | ICD-10-CM | POA: Diagnosis not present

## 2018-02-03 DIAGNOSIS — D508 Other iron deficiency anemias: Secondary | ICD-10-CM | POA: Diagnosis not present

## 2018-02-03 DIAGNOSIS — I10 Essential (primary) hypertension: Secondary | ICD-10-CM

## 2018-02-03 DIAGNOSIS — Z8719 Personal history of other diseases of the digestive system: Secondary | ICD-10-CM

## 2018-02-03 DIAGNOSIS — Z0000 Encounter for general adult medical examination without abnormal findings: Secondary | ICD-10-CM

## 2018-02-03 DIAGNOSIS — D500 Iron deficiency anemia secondary to blood loss (chronic): Secondary | ICD-10-CM | POA: Diagnosis not present

## 2018-02-03 LAB — POCT GLYCOSYLATED HEMOGLOBIN (HGB A1C): HEMOGLOBIN A1C: 6.3 % — AB (ref 4.0–5.6)

## 2018-02-03 LAB — GLUCOSE, CAPILLARY: Glucose-Capillary: 98 mg/dL (ref 70–99)

## 2018-02-03 MED ORDER — CYCLOBENZAPRINE HCL 5 MG PO TABS: 5.0000 mg | ORAL_TABLET | Freq: Every evening | ORAL | 0 refills | Status: DC | PRN

## 2018-02-03 NOTE — Progress Notes (Signed)
   CC: Right hip pain and F/U HTN, DM  HPI:  [name] is a 62 y.o. female with PMHx listed below presenting for continued evaluation and management of her chronic medical illnesses. Please see the A&P for the status of the patient's chronic medical problems.  Past Medical History:  Diagnosis Date  . Arthritis   . Bilateral thigh pain 11/17/2015  . BV (bacterial vaginosis) 05/19/2016  . Hypertension   . Hypokalemia 06/07/2016  . Pap smear for cervical cancer screening 05/17/2016  . Restless leg syndrome   . Sinus congestion 05/17/2016  . Sleep apnea   . Symptomatic cholelithiasis 06/05/2016  . Trapezius muscle strain 10/23/2016   Review of Systems:  Performed and all others negative.  Physical Exam: Vitals:   02/03/18 1318  BP: 133/65  Pulse: 96  Temp: 98.6 F (37 C)  TempSrc: Oral  SpO2: 99%  Weight: 255 lb (115.7 kg)   General: Obese female in no acute distress Pulm: Good air movement with no wheezing or crackles  CV: RRR, no murmurs, no rubs  Abdomen: Active bowel sounds, soft, non-distended, no tenderness to palpation  Extremities: tenderness to palpation of the right hip, no pain elicited with active or passive range of motion, no hip drop, gait normal   Assessment & Plan:   See Encounters Tab for problem based charting.  Patient discussed with Dr. Angelia Mould

## 2018-02-03 NOTE — Patient Instructions (Signed)
Thank you for allowing me to provide your care. Today we are doing the following:  1. For your right hip pain, continue to massage and stretch. I have sent out a short course of muscle relaxers. Please take these only at night as they can make you very tired. If things are not improving over the next 1 to 2 weeks please call me and I will get you referred to physical therapy.  2. We are checking some blood work today because you have been anemic in the past. I will also get you referred to gastroenterology to evaluate your hemorrhoids.  3. I will put in a referral to get you a mammography.  Please come back to see me in six months or sooner if anything arises. I hope that you and your family have a blessed Christmas and New Year's.

## 2018-02-04 ENCOUNTER — Encounter: Payer: Self-pay | Admitting: Pulmonary Disease

## 2018-02-04 ENCOUNTER — Encounter: Payer: Self-pay | Admitting: Internal Medicine

## 2018-02-04 ENCOUNTER — Ambulatory Visit: Payer: Medicaid Other | Admitting: Pulmonary Disease

## 2018-02-04 VITALS — BP 154/78 | HR 76 | Ht 61.0 in | Wt 253.0 lb

## 2018-02-04 DIAGNOSIS — E669 Obesity, unspecified: Secondary | ICD-10-CM | POA: Diagnosis not present

## 2018-02-04 DIAGNOSIS — G4730 Sleep apnea, unspecified: Secondary | ICD-10-CM

## 2018-02-04 DIAGNOSIS — Z9989 Dependence on other enabling machines and devices: Secondary | ICD-10-CM | POA: Diagnosis not present

## 2018-02-04 DIAGNOSIS — G4733 Obstructive sleep apnea (adult) (pediatric): Secondary | ICD-10-CM | POA: Diagnosis not present

## 2018-02-04 LAB — CBC
HEMOGLOBIN: 12.1 g/dL (ref 11.1–15.9)
Hematocrit: 36.8 % (ref 34.0–46.6)
MCH: 26.8 pg (ref 26.6–33.0)
MCHC: 32.9 g/dL (ref 31.5–35.7)
MCV: 82 fL (ref 79–97)
Platelets: 319 10*3/uL (ref 150–450)
RBC: 4.51 x10E6/uL (ref 3.77–5.28)
RDW: 14.8 % (ref 12.3–15.4)
WBC: 7.1 10*3/uL (ref 3.4–10.8)

## 2018-02-04 NOTE — Progress Notes (Signed)
[facility]  Chief Complaint  Patient presents with  . Follow-up    Pt is doing well with cpap machine.    Constitutional:  BP (!) 154/78 (BP Location: Left Arm, Cuff Size: Normal)   Pulse 76   Ht 5\' 1"  (1.549 m)   Wt 253 lb (114.8 kg)   SpO2 100%   BMI 47.80 kg/m   Past Medical History:  Arthritis, RLS, Anemia, Cholelithiasis, HTN, DM  Brief Summary:  Kristen Frost is a 62 y.o. female with obstructive sleep apnea.  She got a cold recently.  Still has some nasal congestion and post nasal drip.  This is getting better.  Not having fever, cough, sputum, chest pain, or wheeze.  CPAP working well.  No issues with mask fit.  Normally does not have sinus congestion, dry mouth mouth, sore throat, or aerophagia.  Physical Exam:   Appearance - well kempt   ENMT - clear nasal mucosa, midline nasal  septum, no oral exudates, no LAN, trachea midline  Respiratory - normal chest wall, normal respiratory effort, no accessory muscle use, no wheeze/rales  CV - s1s2 regular rate and rhythm, no murmurs, no peripheral edema, radial pulses symmetric  GI - soft, non tender, no masses  Lymph - no adenopathy noted in neck and axillary areas  MSK - normal gait  Ext - no cyanosis, clubbing, or joint inflammation noted  Skin - no rashes, lesions, or ulcers  Neuro - normal strength, oriented x 3  Psych - normal mood and affect   Assessment/Plan:   Obstructive sleep apnea. - she is compliant with CPAP and reports benefit form therapy - continue auto CPAP  Obesity. - discussed importance of weight loss  Hypertension. - f/u with PCP   Patient Instructions  Will arrange for replacement CPAP mask and supplies  Follow up in 1 year    Chesley Mires, MD [place] Pager: [phone] 02/04/2018, 9:25 AM  Flow Sheet    Sleep tests:  PSG 08/20/09 >> AHI 26 PSG 04/25/16 >> AHI 16.4, SpO2 low 88%.  CPAP 9 cm  H2O Auto CPAP 01/04/18 to 02/02/18 >> used on 29 of 30 nights with average 5 hrs 47 min.  Average AHI 0.7 with median CPAP 10 and 95 th percentile CPAP 12 cm H2O.  Medications:   Allergies as of 02/04/2018      Reactions   No Known Allergies       Medication List       Accurate as of February 04, 2018  9:25 AM. Always use your most recent med list.        amLODipine 10 MG tablet Commonly known as:  NORVASC TAKE 1 TABLET BY MOUTH DAILY( DISCONTINUE PREVIOUS 5MG  DOSE AND DISCONTINUE LOSARTAN)   atorvastatin 40 MG tablet Commonly known as:  LIPITOR Take 1 tablet (40 mg total) by mouth daily.   cyclobenzaprine 5 MG tablet Commonly known as:  FLEXERIL Take 1 tablet (5 mg total) by mouth at bedtime as needed for muscle spasms.   ferrous sulfate 325 (65 FE) MG tablet Take 1 tablet (325 mg total) by mouth daily with breakfast.   fluticasone 50 MCG/ACT nasal spray Commonly known as:  FLONASE Place 1 spray into both nostrils daily.   metFORMIN 500 MG 24 hr tablet Commonly known as:  GLUCOPHAGE-XR Take 2 tablets (1,000 mg total) by mouth daily with breakfast.   Vitamin D 50 MCG (2000 UT) Caps Take 1 capsule (2,000 Units  total) by mouth daily.       Past Surgical History:  She  has a past surgical history that includes Rotator cuff repair (2007/2008) and Cholecystectomy (N/A, 06/07/2016).  Family History:  Her family history includes Alcohol abuse in her father; Allergies in her father and another family member; Asthma in an other family member; Gout in an other family member; Hypertension in an other family member; Migraines in an other family member; Rashes / Skin problems in an other family member; Tuberculosis in her father.  Social History:  She  reports that she has never smoked. She has never used smokeless tobacco. She reports that she does not drink alcohol or use drugs.

## 2018-02-04 NOTE — Patient Instructions (Signed)
Will arrange for replacement CPAP mask and supplies  Follow up in 1 year 

## 2018-02-04 NOTE — Assessment & Plan Note (Signed)
HPI: patient with known iron deficiency anemia felt to be secondary to decreased dietary intake and lower G.I. bleed secondary to hemorrhoids. She is doing well but continues to have rectal bleeding after bowel movements. She states that the blood is typically on the outside of the stool or on the toilet paper. She is continuing her iron supplementation with 325 mg daily. She reports her last colonoscopy was over 10 years ago. She denies excessive NSAID use. She denies abdominal pain, hematochezia, hematemesis, nausea/vomiting, weight loss. She endorses cravings for red meat.  Assessment/plans: Repeat CBC illustrates that the patient's hemoglobin is now normal with iron supplementation. She continues to have bright red blood per rectum felt to be secondary to hemorrhoids; however, she is do for screening colonoscopy we will refer her to G.I. for further evaluation. No changes to medical management at this point.

## 2018-02-04 NOTE — Assessment & Plan Note (Signed)
HPI: patient presents for continued evaluation and management of her well-controlled hypertension. She is currently on amlodipine 10 mg once daily. She is tolerating this medication without side effects including orthostatic changes. She denies headaches, visual changes, abdominal pain, shortness of breath, chest pain. She is not having any issue obtaining or taking her medication.  Assessment/plan: Patient is currently well controlled on amlodipine 10 mg once daily. Recent basic metabolic panel in September 2019 with normal renal function. Will continue current medical management.

## 2018-02-04 NOTE — Assessment & Plan Note (Signed)
For general health maintenance the patient was referred for colonoscopy and mammography. She needs an ophthalmologic exam for her diabetes. The patient declined vaccination against influenza, pneumonia, and Tdap.

## 2018-02-04 NOTE — Assessment & Plan Note (Signed)
HPI: patient presents for continued evaluation and management of her controlled type II diabetes mellitus. She is currently on metformin 1000 mg once daily. She is tolerating this medication without side effects including diarrhea. She has gained 5 pounds since her last visit and states that she has not been monitoring her diet as she normally does. Her daughter is a Lawyer and so she typically eats extravagant meals. She has started to exercise more by going to the [facility]. She is going to continue to work on her diet and exercise. She will continue to work on weight loss.  Assessment/plan: patient with well-controlled type II diabetes. Hemoglobin A1c of 6.3 today. She is on metformin 1000 mg once daily. She is also on high intensity statin therapy, atorvastatin 40 mg daily, for primary prevention of CVD. Most recent urine studies in January 2019 illustrate no excessive proteinuria so the patient would not benefit from ACE/ARB therapy. Continue current medical management. Follow-up in six months.

## 2018-02-04 NOTE — Assessment & Plan Note (Signed)
HPI: patient presents with a two week history of right hip pain. States that approximately two weeks ago she noticed acute onset right hip pain that seemed to improve with massage. Since that time she has noted that her pain is improving. She has never had this pain before. She is not tried any over-the-counter medications. She denies changes in her gait, weakness, numbness/tingling, abdominal pain. On physical exam she does have tenderness to palpation of the right buttock but otherwise the exam is unremarkable.  Assessment/plan: Based on the patient's HPI she is likely experiencing right hip pain of musculoskeletal etiology. I have prescribed her short course of muscle relaxers. If she feels her pain is not improving over the next 1 to 2 weeks she will contact the office and I will refer her for physical therapy.

## 2018-02-05 NOTE — Progress Notes (Signed)
Internal Medicine Clinic Attending  Case discussed with Dr. Helberg at the time of the visit.  We reviewed the resident's history and exam and pertinent patient test results.  I agree with the assessment, diagnosis, and plan of care documented in the resident's note.    

## 2018-02-06 ENCOUNTER — Encounter: Payer: Medicaid Other | Admitting: Internal Medicine

## 2018-02-21 ENCOUNTER — Encounter: Payer: Self-pay | Admitting: Gastroenterology

## 2018-02-26 ENCOUNTER — Other Ambulatory Visit: Payer: Self-pay | Admitting: Oncology

## 2018-02-26 DIAGNOSIS — I10 Essential (primary) hypertension: Secondary | ICD-10-CM

## 2018-03-13 ENCOUNTER — Ambulatory Visit
Admission: RE | Admit: 2018-03-13 | Discharge: 2018-03-13 | Disposition: A | Discharge status: Home / Self Care | Payer: Medicaid Other | Source: Ambulatory Visit | Attending: Internal Medicine | Admitting: Internal Medicine

## 2018-03-13 DIAGNOSIS — Z0000 Encounter for general adult medical examination without abnormal findings: Secondary | ICD-10-CM

## 2018-03-13 DIAGNOSIS — Z1231 Encounter for screening mammogram for malignant neoplasm of breast: Secondary | ICD-10-CM | POA: Diagnosis not present

## 2018-03-20 NOTE — Progress Notes (Signed)
[facility] Consult Note:  History: Kristen Frost 03/21/2018  Referring physician: Ina Homes, MD  Reason for consult/chief complaint: Anemia; Hemorrhoids; Anal Fissure; and Rectal Bleeding   Subjective  HPI:  This is a very pleasant 63 year old woman referred by primary care for rectal bleeding and concern of anal fissure.  She says a fissure was discovered on visits to urgent care and primary care.  Her bowel habits are mostly regular, but she will have bright red blood per rectum with occasional constipation where she figures "I must have to strain and do not realize it". She also describes lower abdominal/pelvic pressure she stands for very long.  That might also cause urinary urgency and frequency.  She was referred at a December 12 internal medicine clinic appointment noting microcytic anemia dating back to 2017.  However, most recent labs show normal hemoglobin and iron levels.  Iron levels were also normal in 2017.  She denies nausea, vomiting or dysphagia or weight loss. Mariabelen believes she had a colonoscopy elsewhere [place] about 2008, no report available.  ROS:  Review of Systems  Constitutional: Negative for appetite change and unexpected weight change.  HENT: Negative for mouth sores and voice change.   Eyes: Negative for pain and redness.  Respiratory: Negative for cough and shortness of breath.   Cardiovascular: Negative for chest pain and palpitations.  Genitourinary: Negative for dysuria and hematuria.  Musculoskeletal: Positive for arthralgias. Negative for myalgias.  Skin: Negative for pallor and rash.  Neurological: Negative for weakness and headaches.  Hematological: Negative for adenopathy.     Past Medical History: Past Medical History:  Diagnosis Date  . Arthritis   . Bilateral thigh pain 11/17/2015  . BV (bacterial vaginosis) 05/19/2016  . Hypertension   . Hypokalemia 06/07/2016  . Pap smear for cervical  cancer screening 05/17/2016  . Restless leg syndrome   . Sinus congestion 05/17/2016  . Sleep apnea   . Symptomatic cholelithiasis 06/05/2016  . Trapezius muscle strain 10/23/2016     Past Surgical History: Past Surgical History:  Procedure Laterality Date  . CHOLECYSTECTOMY N/A 06/07/2016   Procedure: LAPAROSCOPIC CHOLECYSTECTOMY;  Surgeon: Georganna Skeans, MD;  Location: [facility];  Service: General;  Laterality: N/A;  . ROTATOR CUFF REPAIR  2007/2008   left     Family History: Family History  Problem Relation Age of Onset  . Allergies Father   . Alcohol abuse Father   . Tuberculosis Father        deceased from TB  . Allergies Other        sibling  . Asthma Other        sibling  . Hypertension Other        sibling  . Migraines Other        sibling  . Rashes / Skin problems Other        sibling  . Gout Other        sibling    Social History: Social History   Socioeconomic History  . Marital status: Married    Spouse name: Not on file  . Number of children: Not on file  . Years of education: Not on file  . Highest education level: Not on file  Occupational History  . Occupation: unemployeed  Social Needs  . Financial resource strain: Not on file  . Food insecurity:    Worry: Not on file    Inability: Not on file  . Transportation needs:    Medical: Not on  file    Non-medical: Not on file  Tobacco Use  . Smoking status: Never Smoker  . Smokeless tobacco: Never Used  Substance and Sexual Activity  . Alcohol use: No  . Drug use: No  . Sexual activity: Not on file  Lifestyle  . Physical activity:    Days per week: Not on file    Minutes per session: Not on file  . Stress: Not on file  Relationships  . Social connections:    Talks on phone: Not on file    Gets together: Not on file    Attends religious service: Not on file    Active member of club or organization: Not on file    Attends meetings of clubs or organizations: Not on file    Relationship  status: Not on file  Other Topics Concern  . Not on file  Social History Narrative  . Not on file    Allergies: Allergies  Allergen Reactions  . No Known Allergies     Outpatient Meds: Current Outpatient Medications  Medication Sig Dispense Refill  . amLODipine (NORVASC) 10 MG tablet TAKE 1 TABLET BY MOUTH DAILY) DISCONTINUE 5MG  PREVIOUS DOSE) 90 tablet 0  . atorvastatin (LIPITOR) 40 MG tablet Take 1 tablet (40 mg total) by mouth daily. 90 tablet 1  . Cholecalciferol (VITAMIN D) 2000 units CAPS Take 1 capsule (2,000 Units total) by mouth daily. 90 capsule 1  . cyclobenzaprine (FLEXERIL) 5 MG tablet Take 1 tablet (5 mg total) by mouth at bedtime as needed for muscle spasms. 14 tablet 0  . ferrous sulfate 325 (65 FE) MG tablet Take 1 tablet (325 mg total) by mouth daily with breakfast. 90 tablet 1  . fluticasone (FLONASE) 50 MCG/ACT nasal spray Place 1 spray into both nostrils daily. 16 g 0  . hydrocortisone cream 1 % Apply 1 application topically as needed for itching.    . metFORMIN (GLUCOPHAGE-XR) 500 MG 24 hr tablet Take 2 tablets (1,000 mg total) by mouth daily with breakfast. 180 tablet 1  . PEG-KCl-NaCl-NaSulf-Na Asc-C (PLENVU) 140 g SOLR Take 140 g by mouth as directed. 1 each 0   No current facility-administered medications for this visit.       ___________________________________________________________________ Objective   Exam:  BP 124/72   Pulse 78   Ht 5\' 1"  (1.549 m)   Wt 256 lb 8 oz (116.3 kg)   BMI 48.47 kg/m  Exam chaperoned by Magdalene River, CMA  General: this is a(n) morbidly obese woman, not acutely ill-appearing  Eyes: sclera anicteric, no redness  ENT: oral mucosa moist without lesions, no cervical or supraclavicular lymphadenopathy  CV: RRR without murmur, S1/S2, no JVD, no peripheral edema  Resp: clear to auscultation bilaterally, normal RR and effort noted  GI: soft, no tenderness, with active bowel sounds.  Cannot assess mass or hepatomegaly  due to body habitus.  Skin; warm and dry, no rash or jaundice noted  Neuro: awake, alert and oriented x 3. Normal gross motor function and fluent speech Rectal: Normal external, no fissure or tenderness, no palpable internal lesions.  There is irregular texture to the anterior rectal wall but the tissue is soft and it is nontender.  It is bulging against finger on DRE as if there is vaginal prolapse/rectocele.  Labs:  CBC Latest Ref Rng & Units 02/03/2018 06/09/2016 06/08/2016  WBC 3.4 - 10.8 x10E3/uL 7.1 9.6 10.3  Hemoglobin 11.1 - 15.9 g/dL 12.1 10.2(L) 9.9(L)  Hematocrit 34.0 - 46.6 % 36.8 32.6(L)  31.6(L)  Platelets 150 - 450 x10E3/uL 319 269 271   CMP Latest Ref Rng & Units 11/07/2017 06/09/2016 06/08/2016  Glucose 65 - 99 mg/dL 90 104(H) 130(H)  BUN 8 - 27 mg/dL 11 10 6   Creatinine 0.57 - 1.00 mg/dL 1.00 1.14(H) 1.10(H)  Sodium 134 - 144 mmol/L 144 143 144  Potassium 3.5 - 5.2 mmol/L 4.4 3.5 3.6  Chloride 96 - 106 mmol/L 105 104 109  CO2 20 - 29 mmol/L 25 29 27   Calcium 8.7 - 10.3 mg/dL 9.7 9.0 9.0  Total Protein 6.5 - 8.1 g/dL - 6.9 6.6  Total Bilirubin 0.3 - 1.2 mg/dL - 0.8 1.2  Alkaline Phos 38 - 126 U/L - 125 139(H)  AST 15 - 41 U/L - 95(H) 168(H)  ALT 14 - 54 U/L - 156(H) 215(H)   GFR over 50  Iron/TIBC/Ferritin/ %Sat    Component Value Date/Time   IRON 53 02/28/2017 1443   TIBC 342 02/28/2017 1443   FERRITIN 52 02/28/2017 1443   IRONPCTSAT 15 02/28/2017 1443   Iron levels also normal in 2017   Assessment: Encounter Diagnoses  Name Primary?  . Rectal bleeding Yes  . Pelvic pain     Bleeding sounds most likely to be hemorrhoidal or other benign anal rectal bleeding related to intermittent constipation.  It should be investigated further with colonoscopy.  She is agreeable after discussion of procedure and risks.  Increased risk procedure due to her BMI  The benefits and risks of the planned procedure were described in detail with the patient or (when  appropriate) their health care proxy.  Risks were outlined as including, but not limited to, bleeding, infection, perforation, adverse medication reaction leading to cardiac or pulmonary decompensation, or pancreatitis (if ERCP).  The limitation of incomplete mucosal visualization was also discussed.  No guarantees or warranties were given.  Pelvic pressure seems likely to be bladder and/or uterine prolapse, advised gynecologic follow-up.   Thank you for the courtesy of this consult.  Please call me with any questions or concerns.  Nelida Meuse III  CC: Referring provider noted above

## 2018-03-21 ENCOUNTER — Encounter: Payer: Self-pay | Admitting: Gastroenterology

## 2018-03-21 ENCOUNTER — Ambulatory Visit: Payer: Medicaid Other | Admitting: Gastroenterology

## 2018-03-21 VITALS — BP 124/72 | HR 78 | Ht 61.0 in | Wt 256.5 lb

## 2018-03-21 DIAGNOSIS — K625 Hemorrhage of anus and rectum: Secondary | ICD-10-CM

## 2018-03-21 DIAGNOSIS — R102 Pelvic and perineal pain: Secondary | ICD-10-CM

## 2018-03-21 MED ORDER — PEG-KCL-NACL-NASULF-NA ASC-C 140 G PO SOLR: 140.0000 g | ORAL | 0 refills | Status: DC

## 2018-03-21 NOTE — Patient Instructions (Signed)
If you are age 63 or older, your body mass index should be between 23-30. Your Body mass index is 48.47 kg/m. If this is out of the aforementioned range listed, please consider follow up with your Primary Care Provider.  If you are age 70 or younger, your body mass index should be between 19-25. Your Body mass index is 48.47 kg/m. If this is out of the aformentioned range listed, please consider follow up with your Primary Care Provider.   You have been scheduled for a colonoscopy. Please follow written instructions given to you at your visit today.  Please pick up your prep supplies at the pharmacy within the next 1-3 days. If you use inhalers (even only as needed), please bring them with you on the day of your procedure. Your physician has requested that you go to www.startemmi.com and enter the access code given to you at your visit today. This web site gives a general overview about your procedure. However, you should still follow specific instructions given to you by our office regarding your preparation for the procedure.  It was a pleasure to see you today!  Dr. Loletha Carrow

## 2018-04-10 ENCOUNTER — Ambulatory Visit (AMBULATORY_SURGERY_CENTER): Payer: Medicaid Other | Admitting: Gastroenterology

## 2018-04-10 ENCOUNTER — Encounter: Payer: Self-pay | Admitting: Gastroenterology

## 2018-04-10 VITALS — BP 125/83 | HR 83 | Temp 96.9°F | Resp 13 | Ht 65.0 in | Wt 256.0 lb

## 2018-04-10 DIAGNOSIS — D122 Benign neoplasm of ascending colon: Secondary | ICD-10-CM | POA: Diagnosis not present

## 2018-04-10 DIAGNOSIS — K620 Anal polyp: Secondary | ICD-10-CM

## 2018-04-10 DIAGNOSIS — K625 Hemorrhage of anus and rectum: Secondary | ICD-10-CM

## 2018-04-10 DIAGNOSIS — K622 Anal prolapse: Secondary | ICD-10-CM | POA: Diagnosis not present

## 2018-04-10 DIAGNOSIS — K629 Disease of anus and rectum, unspecified: Secondary | ICD-10-CM

## 2018-04-10 MED ORDER — SODIUM CHLORIDE 0.9 % IV SOLN: 500.0000 mL | Freq: Once | INTRAVENOUS | Status: DC

## 2018-04-10 NOTE — Progress Notes (Signed)
PT taken to PACU. Monitors in place. VSS. Report given to RN. 

## 2018-04-10 NOTE — Patient Instructions (Signed)
YOU HAD AN ENDOSCOPIC PROCEDURE TODAY AT [facility]:   Refer to the procedure report that was given to you for any specific questions about what was found during the examination.  If the procedure report does not answer your questions, please call your gastroenterologist to clarify.  If you requested that your care partner not be given the details of your procedure findings, then the procedure report has been included in a sealed envelope for you to review at your convenience later.  YOU SHOULD EXPECT: Some feelings of bloating in the abdomen. Passage of more gas than usual.  Walking can help get rid of the air that was put into your GI tract during the procedure and reduce the bloating. If you had a lower endoscopy (such as a colonoscopy or flexible sigmoidoscopy) you may notice spotting of blood in your stool or on the toilet paper. If you underwent a bowel prep for your procedure, you may not have a normal bowel movement for a few days.  Please Note:  You might notice some irritation and congestion in your nose or some drainage.  This is from the oxygen used during your procedure.  There is no need for concern and it should clear up in a day or so.  SYMPTOMS TO REPORT IMMEDIATELY:   Following lower endoscopy (colonoscopy or flexible sigmoidoscopy):  Excessive amounts of blood in the stool  Significant tenderness or worsening of abdominal pains  Swelling of the abdomen that is new, acute  Fever of 100F or higher   For urgent or emergent issues, a gastroenterologist can be reached at any hour by calling [phone].   DIET:  We do recommend a small meal at first, but then you may proceed to your regular diet.  Drink plenty of fluids but you should avoid alcoholic beverages for 24 hours.  MEDICATIONS: Continue present medications.  Follow up with colo-rectal surgeon at appointment to be scheduled. Dr. Loletha Carrow' office will call you to schedule this appointment.  Please see  handouts given to you by your recovery nurse.  ACTIVITY:  You should plan to take it easy for the rest of today and you should NOT DRIVE or use heavy machinery until tomorrow (because of the sedation medicines used during the test).    FOLLOW UP: Our staff will call the number listed on your records the next business day following your procedure to check on you and address any questions or concerns that you may have regarding the information given to you following your procedure. If we do not reach you, we will leave a message.  However, if you are feeling well and you are not experiencing any problems, there is no need to return our call.  We will assume that you have returned to your regular daily activities without incident.  If any biopsies were taken you will be contacted by phone or by letter within the next 1-3 weeks.  Please call us at [phone] if you have not heard about the biopsies in 3 weeks.   Thank you for allowing [place] to provide for your healthcare needs today.  SIGNATURES/CONFIDENTIALITY: You and/or your care partner have signed paperwork which will be entered into your electronic medical record.  These signatures attest to the fact that that the information above on your After Visit Summary has been reviewed and is understood.  Full responsibility of the confidentiality of this discharge information lies with you and/or your care-partner.

## 2018-04-10 NOTE — Progress Notes (Signed)
Called to room to assist during endoscopic procedure.  Patient ID and intended procedure confirmed with present staff. Received instructions for my participation in the procedure from the performing physician.  

## 2018-04-10 NOTE — Op Note (Signed)
[facility] Patient Name: Kristen Frost Procedure Date: 04/10/2018 3:24 PM MRN: [id number] Endoscopist: Mallie Mussel L. Loletha Carrow , MD Age: 63 Referring MD:  Date of Birth: 10/06/1955 Gender: Female Account #: [id number] Procedure:                Colonoscopy Indications:              Rectal bleeding, Rectal pain Medicines:                Monitored Anesthesia Care Procedure:                Pre-Anesthesia Assessment:                           - Prior to the procedure, a History and Physical                            was performed, and patient medications and                            allergies were reviewed. The patient's tolerance of                            previous anesthesia was also reviewed. The risks                            and benefits of the procedure and the sedation                            options and risks were discussed with the patient.                            All questions were answered, and informed consent                            was obtained. Prior Anticoagulants: The patient has                            taken no previous anticoagulant or antiplatelet                            agents. ASA Grade Assessment: III - A patient with                            severe systemic disease. After reviewing the risks                            and benefits, the patient was deemed in                            satisfactory condition to undergo the procedure.                           After obtaining informed consent, the colonoscope  was passed under direct vision. Throughout the                            procedure, the patient's blood pressure, pulse, and                            oxygen saturations were monitored continuously. The                            Model CF-HQ190L [id number]) scope was introduced                            through the anus and advanced to the the cecum,                            identified by appendiceal  orifice and ileocecal                            valve. The colonoscopy was performed without                            difficulty. The patient tolerated the procedure                            well. The quality of the bowel preparation was                            excellent. The ileocecal valve, appendiceal                            orifice, and rectum were photographed. Scope In: 3:52:41 PM Scope Out: 4:08:27 PM Scope Withdrawal Time: 0 hours 13 minutes 6 seconds  Total Procedure Duration: 0 hours 15 minutes 46 seconds  Findings:                 The perianal exam findings include a large,                            anterior internal hemorrhoid column with inflaed,                            friable tissue that bled with palpation. The tissue                            continued to prolapse after manual reduction.                           A diminutive polyp was found in the ascending                            colon. The polyp was flat. The polyp was removed                            with a piecemeal technique using a cold biopsy  forceps. Resection and retrieval were complete.                           Diverticula were found in the sigmoid colon.                           Internal hemorrhoids were found, seen best on                            retroflexion. The anterior column of hemorrhoids                            was large and inflamed with friable, irregular                            overlying mucosa (the lesion that had prolapsed                            seen on perianal exam). Biopsies were taken with a                            cold forceps for histology.                           The exam was otherwise without abnormality. Complications:            No immediate complications. Estimated Blood Loss:     Estimated blood loss was minimal. Impression:               - Internal hemorrhoids that prolapse with                            straining,  but require manual replacement into the                            anal canal (Grade III) found on perianal exam.                           - One diminutive polyp in the ascending colon,                            removed piecemeal using a cold biopsy forceps.                            Resected and retrieved.                           - Diverticulosis in the sigmoid colon.                           - Internal hemorrhoids. Biopsied.                           - The examination was otherwise normal. Recommendation:           - Patient has a contact number available  for                            emergencies. The signs and symptoms of potential                            delayed complications were discussed with the                            patient. Return to normal activities tomorrow.                            Written discharge instructions were provided to the                            patient.                           - Resume previous diet.                           - Continue present medications.                           - Await pathology results.                           - Repeat colonoscopy is recommended for                            surveillance. The colonoscopy date will be                            determined after pathology results from today's                            exam become available for review.                           - Refer to a colo-rectal surgeon at appointment to                            be scheduled. Ragen Laver L. Loletha Carrow, MD 04/10/2018 4:23:23 PM This report has been signed electronically.

## 2018-04-11 ENCOUNTER — Telehealth: Payer: Self-pay

## 2018-04-11 ENCOUNTER — Telehealth: Payer: Self-pay | Admitting: Gastroenterology

## 2018-04-11 NOTE — Telephone Encounter (Signed)
Kristen Frost,  Please send a referral to colorectal surgery, Dr. Marcello Moores or [facility] at [facility] for this patient.  Diagnosis: Large symptomatic internal hemorrhoid  Please send my recent office note and colonoscopy report.  Pathology is pending.

## 2018-04-11 NOTE — Telephone Encounter (Signed)
  Follow up Call-  Call back number 04/10/2018  Post procedure Call Back phone  # [phone]  Permission to leave phone message Yes  Some recent data might be hidden     Patient questions:  Do you have a fever, pain , or abdominal swelling? No. Pain Score  0 *  Have you tolerated food without any problems? Yes.  Have you been able to return to your normal activities? Yes.  Do you have any questions about your discharge instructions: Diet   No. Medications  No. Follow up visit  No.  Do you have questions or concerns about your Care? No.  Actions: * If pain score is 4 or above: No action needed, pain <4.

## 2018-04-11 NOTE — Telephone Encounter (Signed)
Referral faxed to [facility]

## 2018-04-15 ENCOUNTER — Telehealth: Payer: Self-pay

## 2018-04-15 NOTE — Telephone Encounter (Signed)
-----   Message from Mila Merry sent at 04/15/2018  4:41 PM EST ----- Regarding: RE: Referral for hemorrhoids Appt 05/15/18 at 11:20 Dr.Thomas ----- Message ----- From: Algernon Huxley, RN Sent: 04/14/2018   3:17 PM EST To: Mila Merry Subject: Referral for hemorrhoids                       Please let me know when this pt is scheduled to be seen.

## 2018-04-17 ENCOUNTER — Encounter: Payer: Self-pay | Admitting: Gastroenterology

## 2018-04-24 ENCOUNTER — Other Ambulatory Visit: Payer: Self-pay | Admitting: *Deleted

## 2018-04-24 DIAGNOSIS — E785 Hyperlipidemia, unspecified: Secondary | ICD-10-CM

## 2018-04-24 MED ORDER — ATORVASTATIN CALCIUM 40 MG PO TABS: 40.0000 mg | ORAL_TABLET | Freq: Every day | ORAL | 3 refills | Status: DC

## 2018-05-13 ENCOUNTER — Telehealth: Payer: Self-pay | Admitting: Gastroenterology

## 2018-05-13 NOTE — Telephone Encounter (Signed)
Pt was referred to Dr. Marcello Moores for hemorrhoids but reported that she has lost the information sheet regarding her appointment.  Pt requested a phone call.

## 2018-05-13 NOTE — Telephone Encounter (Signed)
Pt lost her appt information for [facility]. Pt given their phone number to call and find out her appt information.

## 2018-06-25 ENCOUNTER — Other Ambulatory Visit: Payer: Self-pay | Admitting: *Deleted

## 2018-06-25 DIAGNOSIS — E119 Type 2 diabetes mellitus without complications: Secondary | ICD-10-CM

## 2018-06-25 MED ORDER — METFORMIN HCL ER 500 MG PO TB24: 1000.0000 mg | ORAL_TABLET | Freq: Every day | ORAL | 0 refills | Status: DC

## 2018-06-25 NOTE — Telephone Encounter (Signed)
Next appt scheduled 6/18 with PCP.

## 2018-08-07 ENCOUNTER — Encounter: Payer: Medicaid Other | Admitting: Internal Medicine

## 2018-08-12 DIAGNOSIS — K641 Second degree hemorrhoids: Secondary | ICD-10-CM | POA: Diagnosis not present

## 2018-08-19 ENCOUNTER — Other Ambulatory Visit: Payer: Self-pay | Admitting: *Deleted

## 2018-08-19 DIAGNOSIS — I10 Essential (primary) hypertension: Secondary | ICD-10-CM

## 2018-08-19 DIAGNOSIS — R0981 Nasal congestion: Secondary | ICD-10-CM

## 2018-08-19 NOTE — Telephone Encounter (Signed)
Next appt scheduled 8/6 with PCP. 

## 2018-08-21 MED ORDER — FLUTICASONE PROPIONATE 50 MCG/ACT NA SUSP: 1.0000 | Freq: Every day | NASAL | 0 refills | Status: DC

## 2018-08-21 MED ORDER — AMLODIPINE BESYLATE 10 MG PO TABS: ORAL_TABLET | ORAL | 3 refills | Status: DC

## 2018-09-01 DIAGNOSIS — K641 Second degree hemorrhoids: Secondary | ICD-10-CM | POA: Diagnosis not present

## 2018-09-25 ENCOUNTER — Other Ambulatory Visit: Payer: Self-pay

## 2018-09-25 ENCOUNTER — Encounter: Payer: Self-pay | Admitting: Internal Medicine

## 2018-09-25 ENCOUNTER — Ambulatory Visit (INDEPENDENT_AMBULATORY_CARE_PROVIDER_SITE_OTHER): Payer: Medicaid Other | Admitting: Internal Medicine

## 2018-09-25 VITALS — BP 112/64 | HR 89 | Temp 98.3°F | Ht 61.5 in | Wt 261.6 lb

## 2018-09-25 DIAGNOSIS — Z7984 Long term (current) use of oral hypoglycemic drugs: Secondary | ICD-10-CM

## 2018-09-25 DIAGNOSIS — I10 Essential (primary) hypertension: Secondary | ICD-10-CM | POA: Diagnosis not present

## 2018-09-25 DIAGNOSIS — Z6841 Body Mass Index (BMI) 40.0 and over, adult: Secondary | ICD-10-CM | POA: Diagnosis not present

## 2018-09-25 DIAGNOSIS — E119 Type 2 diabetes mellitus without complications: Secondary | ICD-10-CM | POA: Diagnosis not present

## 2018-09-25 DIAGNOSIS — Z79899 Other long term (current) drug therapy: Secondary | ICD-10-CM | POA: Diagnosis not present

## 2018-09-25 LAB — POCT GLYCOSYLATED HEMOGLOBIN (HGB A1C): Hemoglobin A1C: 6.8 % — AB (ref 4.0–5.6)

## 2018-09-25 LAB — GLUCOSE, CAPILLARY: Glucose-Capillary: 95 mg/dL (ref 70–99)

## 2018-09-25 MED ORDER — LIRAGLUTIDE 18 MG/3ML ~~LOC~~ SOPN: PEN_INJECTOR | SUBCUTANEOUS | 2 refills | Status: DC

## 2018-09-25 NOTE — Patient Instructions (Signed)
Thank you for allowing me to provide your care. Today we discussed weight loss. Continue to work on diet and exercise. We will start you on a new medication to assist you in the weight loss process. Please let me know if this medication is too expensive or upsets your stomach.   Please come back in 3 months or sooner if anything arises.

## 2018-09-25 NOTE — Progress Notes (Signed)
   CC: F/u Diabetes and morbid obesity   HPI:  [name] is a 63 y.o. female with PMHx listed below presenting for F/u Diabetes and morbid obesity. Please see the A&P for the status of the patient's chronic medical problems.  Past Medical History:  Diagnosis Date  . Arthritis   . Bilateral thigh pain 11/17/2015  . BV (bacterial vaginosis) 05/19/2016  . Hypertension   . Hypokalemia 06/07/2016  . Pap smear for cervical cancer screening 05/17/2016  . Restless leg syndrome   . Sinus congestion 05/17/2016  . Sleep apnea   . Symptomatic cholelithiasis 06/05/2016  . Trapezius muscle strain 10/23/2016   Review of Systems:  Performed and all others negative.  Physical Exam: Vitals:   09/25/18 1356 09/25/18 1420  BP:  112/64  Pulse:  89  Temp:  98.3 F (36.8 C)  SpO2:  99%  Weight: 261 lb 9.6 oz (118.7 kg)   Height: 5' 1.5" (1.562 m)    General: Obese female in no acute distress Pulm: Good air movement with no wheezing or crackles  CV: RRR, no murmurs, no rubs   Assessment & Plan:   See Encounters Tab for problem based charting.  Patient discussed with Dr. Rebeca Alert

## 2018-09-26 ENCOUNTER — Encounter: Payer: Self-pay | Admitting: Internal Medicine

## 2018-09-26 LAB — MICROALBUMIN / CREATININE URINE RATIO
Creatinine, Urine: 115.1 mg/dL
Microalb/Creat Ratio: 35 mg/g creat — ABNORMAL HIGH (ref 0–29)
Microalbumin, Urine: 40.8 ug/mL

## 2018-09-26 LAB — BMP8+ANION GAP
Anion Gap: 16 mmol/L (ref 10.0–18.0)
BUN/Creatinine Ratio: 11 — ABNORMAL LOW (ref 12–28)
BUN: 14 mg/dL (ref 8–27)
CO2: 20 mmol/L (ref 20–29)
Calcium: 9.4 mg/dL (ref 8.7–10.3)
Chloride: 106 mmol/L (ref 96–106)
Creatinine, Ser: 1.22 mg/dL — ABNORMAL HIGH (ref 0.57–1.00)
GFR calc Af Amer: 55 mL/min/{1.73_m2} — ABNORMAL LOW (ref >59)
GFR calc non Af Amer: 48 mL/min/{1.73_m2} — ABNORMAL LOW (ref >59)
Glucose: 92 mg/dL (ref 65–99)
Potassium: 4.1 mmol/L (ref 3.5–5.2)
Sodium: 142 mmol/L (ref 134–144)

## 2018-09-26 NOTE — Assessment & Plan Note (Signed)
HPI:  Patient with well-controlled hypertension. She is currently only on amlodipine 10 mg daily. She is not experiencing any adverse effects including constipation or lower extremity edema.  A/P: - BMP stable, slight increase in her creatinine. We will recheck at her next visit. - Continue amlodipine 10 mg daily

## 2018-09-26 NOTE — Assessment & Plan Note (Signed)
HPI:  Patient with morbid obesity and metabolic complications. She is interested in weight loss. She is not exercising and has not changed her diet.   A/P: - Discussed that diet and exercise is the foundation of weight loss and weight maintance.  - She would qualify for surgery given her BMI and metabolic complications but she is not interested at this point. -We discussed medications that can assist her. She is interested in doing a medication. I will start her on Victoza 0.6 mg daily and escalate her to 1.2 mg daily

## 2018-09-26 NOTE — Assessment & Plan Note (Addendum)
HPI:  Patient with well-controlled type II diabetes. She is currently on metformin 1000 mg BID. She does not exercise. She is conscious of her diet but has not modified it. She tries to modify her portions.   A/P: - A1c 6.8  - Continue Metformin 1000 mg BID - Urine Microalbumin is elevated will recheck at next visit. If elevated again will need ACE/ARB  - On statin therapy.

## 2018-09-29 ENCOUNTER — Telehealth: Payer: Self-pay | Admitting: Internal Medicine

## 2018-09-29 NOTE — Telephone Encounter (Signed)
Pt needs a prescription for needles for her victoza ; pt contact [facility], [place] AT [place]

## 2018-09-30 DIAGNOSIS — K641 Second degree hemorrhoids: Secondary | ICD-10-CM | POA: Diagnosis not present

## 2018-09-30 NOTE — Progress Notes (Signed)
Internal Medicine Clinic Attending  Case discussed with Dr. Helberg at the time of the visit.  We reviewed the resident's history and exam and pertinent patient test results.  I agree with the assessment, diagnosis, and plan of care documented in the resident's note.  Alexander Raines, M.D., Ph.D.  

## 2018-09-30 NOTE — Telephone Encounter (Signed)
Pt calling back about her Refill Request on yesterday and would like a call back.  liraglutide (VICTOZA) 18 MG/3ML SOPN   [place], [place] AT [place]

## 2018-10-02 MED ORDER — LIRAGLUTIDE 18 MG/3ML ~~LOC~~ SOPN: PEN_INJECTOR | SUBCUTANEOUS | 2 refills | Status: DC

## 2018-10-02 MED ORDER — PEN NEEDLES 31G X 5 MM MISC: 1.0000 "application " | Freq: Every day | 1 refills | Status: DC

## 2018-10-16 ENCOUNTER — Other Ambulatory Visit: Payer: Self-pay | Admitting: *Deleted

## 2018-10-16 DIAGNOSIS — E119 Type 2 diabetes mellitus without complications: Secondary | ICD-10-CM

## 2018-10-16 NOTE — Telephone Encounter (Signed)
Next appt scheduled 11/5 with PCP. 

## 2018-10-18 MED ORDER — METFORMIN HCL ER 500 MG PO TB24: 1000.0000 mg | ORAL_TABLET | Freq: Every day | ORAL | 0 refills | Status: DC

## 2018-10-27 IMAGING — US US ABDOMEN LIMITED
1 series · 14 of 25 positions shown · non-contrast
Comparison: None.

CLINICAL DATA: Right upper quadrant pain.

EXAM:
US ABDOMEN LIMITED - RIGHT UPPER QUADRANT

[Series 1: us abdomen limited · 0.36mm/px · 14 of 50 slices shown]
[im 1/50]
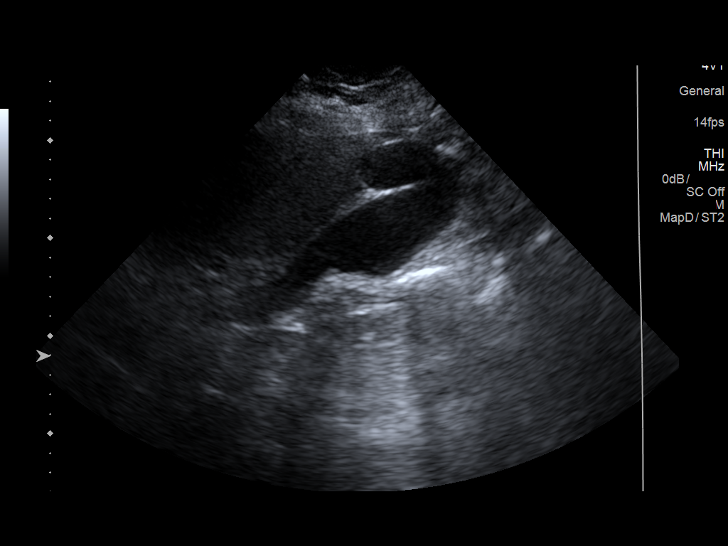
[im 5/50]
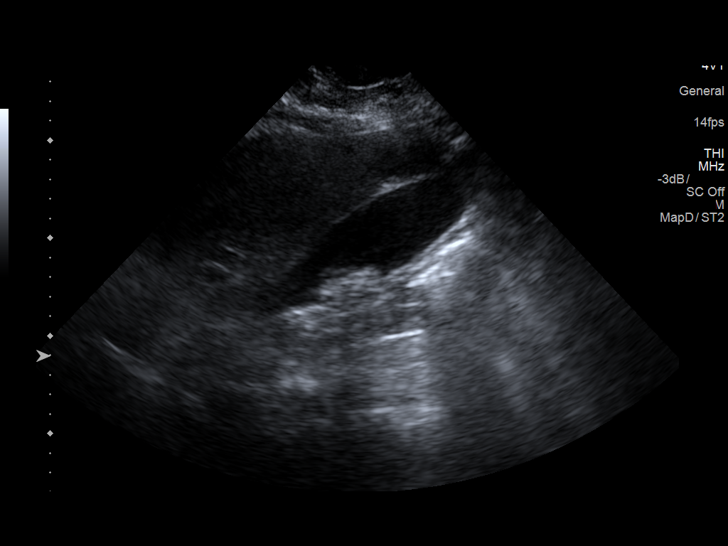
[im 9/50]
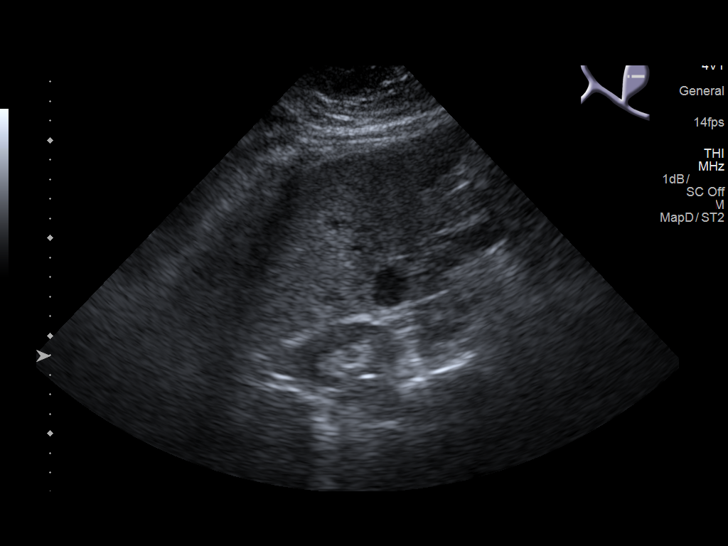
[im 13/50]
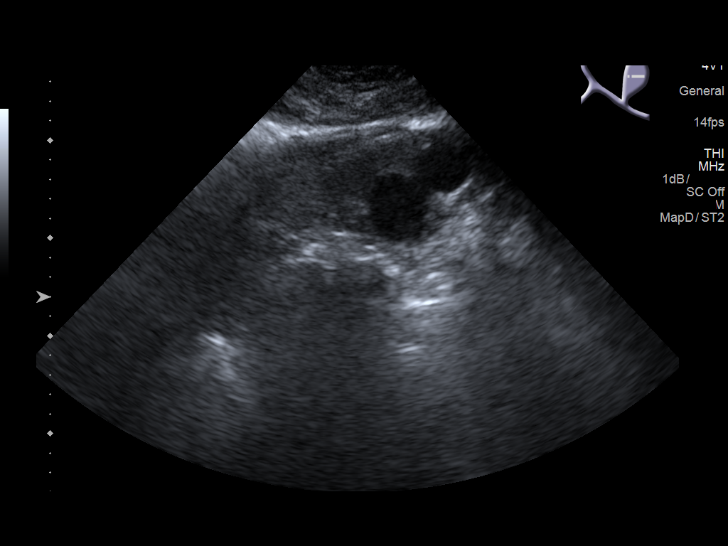
[im 17/50]
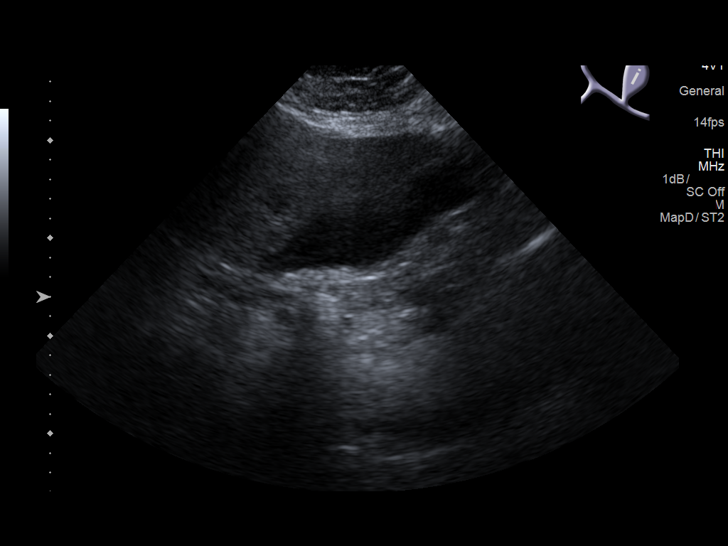
[im 19/50]
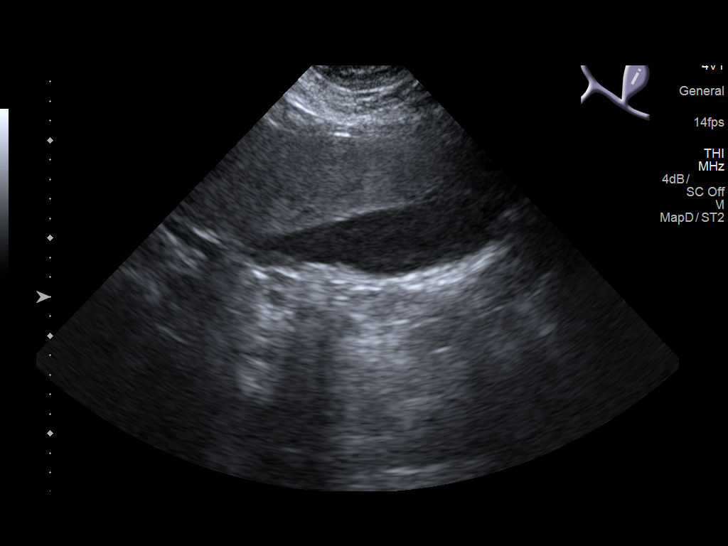
[im 23/50]
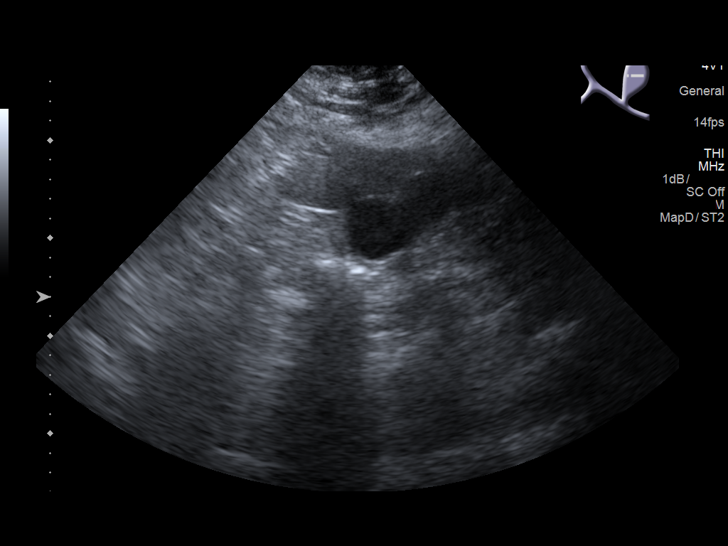
[im 27/50]
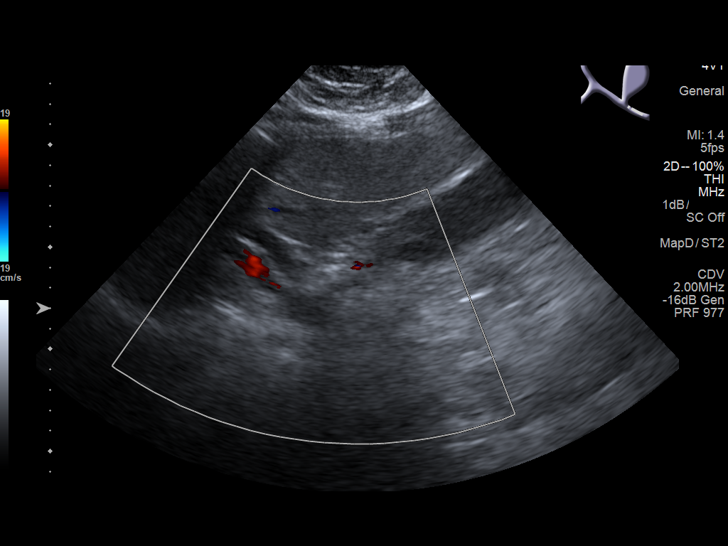
[im 31/50]
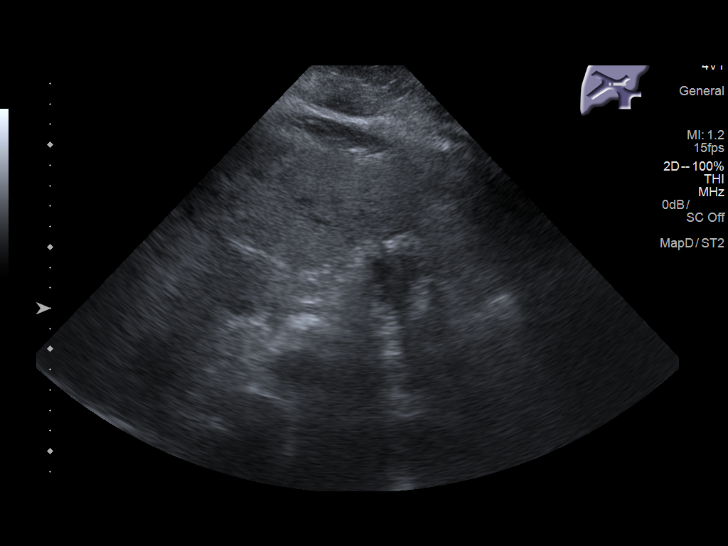
[im 33/50]
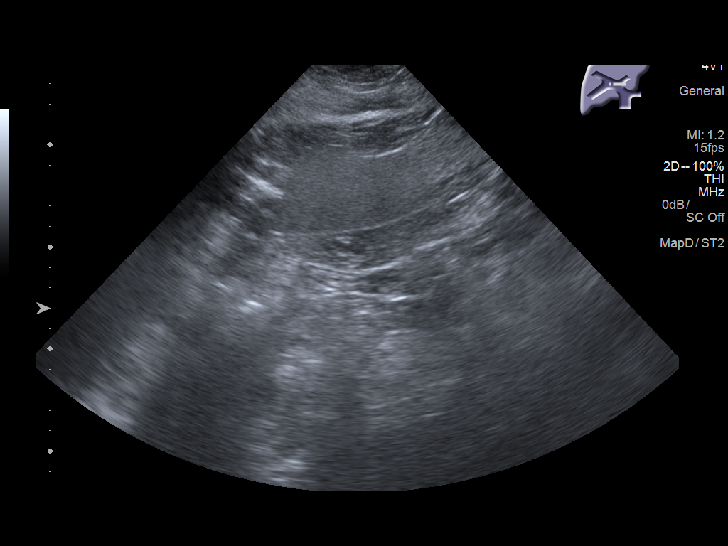
[im 37/50]
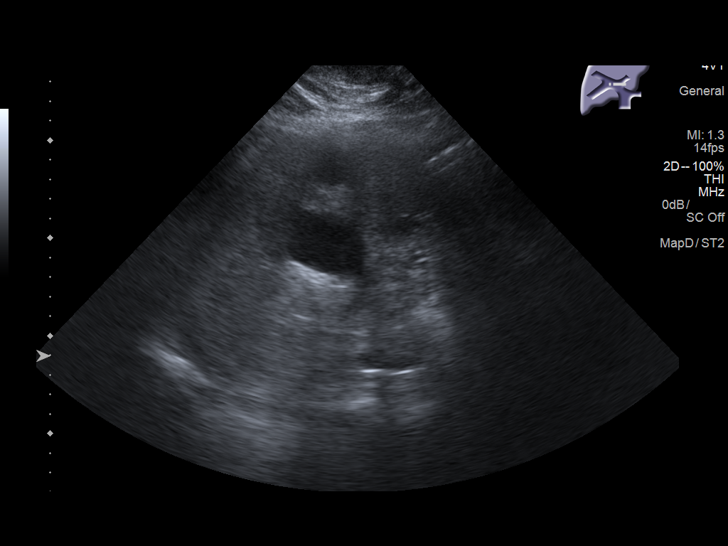
[im 41/50]
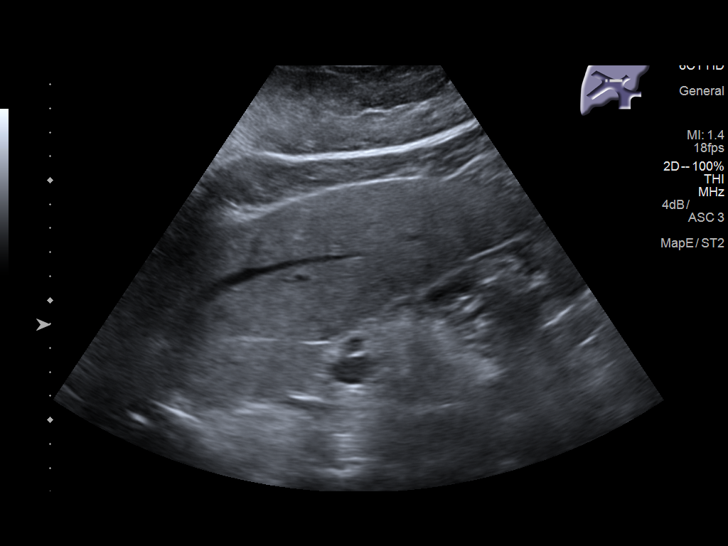
[im 45/50]
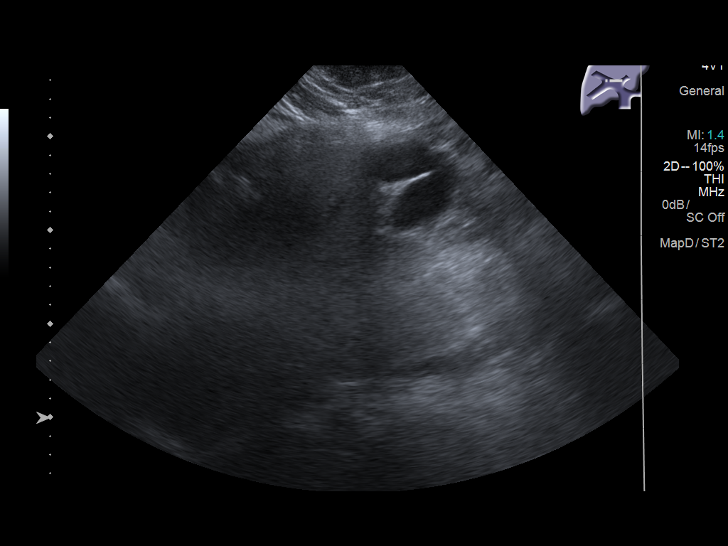
[im 50/50]
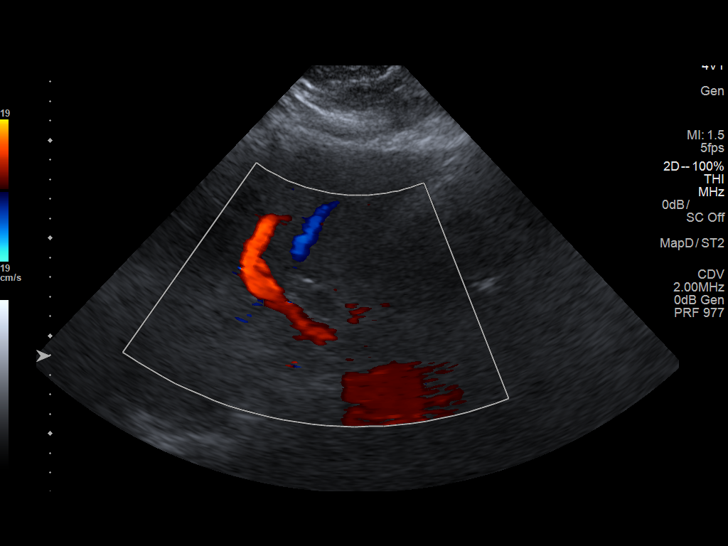

[14 of 25 positions shown; findings below may reference images not displayed]

FINDINGS: Gallbladder:

Cholelithiasis without gallbladder wall thickening or
pericholecystic fluid. No sonographic Murphy sign noted by
sonographer.

Common bile duct:

Diameter: 10.2 mm

Liver:

No focal lesion identified. Increased hepatic parenchymal
echogenicity.
IMPRESSION: 1. Cholelithiasis without sonographic evidence of acute
cholecystitis.
2. Mildly dilated common bile duct measuring 10.2 mm of uncertain
etiology. No choledocholithiasis or obstructing mass identified. If
there is further clinical concern, recommend MRI/MRCP.

## 2018-11-11 ENCOUNTER — Other Ambulatory Visit: Payer: Self-pay | Admitting: *Deleted

## 2018-11-11 DIAGNOSIS — R0981 Nasal congestion: Secondary | ICD-10-CM

## 2018-11-11 MED ORDER — FLUTICASONE PROPIONATE 50 MCG/ACT NA SUSP: 1.0000 | Freq: Every day | NASAL | 0 refills | Status: DC

## 2018-11-17 ENCOUNTER — Other Ambulatory Visit: Payer: Self-pay | Admitting: *Deleted

## 2018-11-17 DIAGNOSIS — E119 Type 2 diabetes mellitus without complications: Secondary | ICD-10-CM

## 2018-11-17 MED ORDER — METFORMIN HCL ER 500 MG PO TB24: 1000.0000 mg | ORAL_TABLET | Freq: Every day | ORAL | 0 refills | Status: DC

## 2018-11-21 ENCOUNTER — Telehealth: Payer: Self-pay | Admitting: Internal Medicine

## 2018-11-21 NOTE — Telephone Encounter (Signed)
That sounds good. Thank you. 

## 2018-11-21 NOTE — Telephone Encounter (Signed)
Pt feels she is needing to talk to a therapist she is very anxious. appt 10/8 at 1000. Dr Tarri Abernethy are you agreeable?

## 2018-11-21 NOTE — Telephone Encounter (Signed)
Pt requesting a callback from nurse [phone]

## 2018-11-25 ENCOUNTER — Other Ambulatory Visit: Payer: Self-pay

## 2018-11-25 ENCOUNTER — Ambulatory Visit (INDEPENDENT_AMBULATORY_CARE_PROVIDER_SITE_OTHER): Payer: Medicaid Other | Admitting: Licensed Clinical Social Worker

## 2018-11-25 DIAGNOSIS — F419 Anxiety disorder, unspecified: Secondary | ICD-10-CM

## 2018-11-25 NOTE — BH Specialist Note (Signed)
Integrated Behavioral Health Initial Visit  MRN: [id number] Name: Kristen Frost  Number of [place] Clinician visits:: 1/6 Session Start time: 10:00  Session End time: 10:30 Total time: 30 minutes  Type of Service: [place] Interpretor:No.       SUBJECTIVE: Kristen Frost is a 63 y.o. female accompanied by whom attended the session indiviudally.  Patient was referred by Dr. Tarri Abernethy for . Patient reports the following symptoms/concerns: anxiety, family stressors, and sadness. Duration of problem: recurrent since 2010; Severity of problem: moderate  OBJECTIVE: Mood: Anxious and Affect: Appropriate Risk of harm to self or others: No plan to harm self or others  LIFE CONTEXT: Family and Social: Patient is married, and lives with her husband. Patient's husband was incarcerated in 2010 for six years, and the patient moved from [place] to [facility] for family support.   School/Work: Not currently working.  Self-Care: Needs improvement.   GOALS ADDRESSED: Patient will: 1. Reduce symptoms of: anxiety, depression and stress 2. Increase knowledge and/or ability of: coping skills, healthy habits and stress reduction  3. Demonstrate ability to: Increase healthy adjustment to current life circumstances and Increase adequate support systems for patient/family  INTERVENTIONS: Interventions utilized: Motivational Interviewing, Mindfulness or Relaxation Training, Brief CBT and Supportive Counseling  Standardized Assessments completed: assessed for SI, HI, and self-harm.  ASSESSMENT: Patient currently experiencing moderate levels of anxiety and mild depression. Patient processed life challenges that have occurred over the past 10 years due to her husband being incarcerated. Patient processed how she had to move from [place] to [place] due to financial strain from her husband being in jail. Her spouse had 2 children out of wedlock, and the patient  is continuing to cope with that betrayal from her partner. Patient processed how she lost herself and identity over the past 10 years, and would like to gain back her self-confidence. Patient has not built friendships over the past 10 years of living in [place], and identified that she is guarded. Patient processed anger and sadness towards her marriage in regards to feeling that she is not a priority. Patient wants to purchase a home with her husband, and feels that he is not making this a priority. Patient identified that she wants to work on understanding how to cope with her feelings/emotions in a healthy way.     Patient may benefit from counseling.  PLAN: 1. Follow up with behavioral health clinician on : two weeks. [name], [facility], [place]

## 2018-11-26 ENCOUNTER — Encounter: Payer: Self-pay | Admitting: Licensed Clinical Social Worker

## 2018-12-02 ENCOUNTER — Ambulatory Visit (INDEPENDENT_AMBULATORY_CARE_PROVIDER_SITE_OTHER): Payer: Medicaid Other | Admitting: Licensed Clinical Social Worker

## 2018-12-02 ENCOUNTER — Ambulatory Visit: Payer: Medicaid Other | Admitting: Licensed Clinical Social Worker

## 2018-12-02 ENCOUNTER — Other Ambulatory Visit: Payer: Self-pay

## 2018-12-02 ENCOUNTER — Encounter: Payer: Self-pay | Admitting: Licensed Clinical Social Worker

## 2018-12-02 DIAGNOSIS — F419 Anxiety disorder, unspecified: Secondary | ICD-10-CM

## 2018-12-02 NOTE — BH Specialist Note (Signed)
Integrated Behavioral Health Follow Up Visit  MRN: [id number] Name: Kristen Frost  Number of [place] Clinician visits: 2/6 Session Start time: 9:00  Session End time: 10:00 Total time: 1 hour  Type of Service: [place] Interpretor:No.   SUBJECTIVE: Kristen Frost is a 63 y.o. female  whom attended individually.  Patient was referred by Dr.Helberg for anxiety. Patient reports the following symptoms/concerns: anxiety, family stressors, and sadness. Duration of problem: recurrent since 2010; Severity of problem: moderate  OBJECTIVE: Mood: Anxious and Affect: Appropriate Risk of harm to self or others: No plan to harm self or others  GOALS ADDRESSED: Patient will: 1.  Reduce symptoms of: agitation, anxiety, depression and stress  2.  Increase knowledge and/or ability of: coping skills, healthy habits and stress reduction  3.  Demonstrate ability to: Increase healthy adjustment to current life circumstances and Increase adequate support systems for patient/family  INTERVENTIONS: Interventions utilized:  Motivational Interviewing, Brief CBT and Supportive Counseling Standardized Assessments completed: Not Needed  ASSESSMENT: Patient currently experiencing ongoing levels of anxiety. Patient is processing her past, and how she has made significant changes to move forward through tough situations. Patient is reporting feelings of ressentiment, and how she is trying to continuing to let go of some of the negative thoughts that take over her mind at times. Patient has a desire to feel appreciated by her spouse, and feels this is an area for improvement. Patient has made great strides in dealing with her anger towards her spouse, and the negativity he has contributed to her life in the past. Patient established some goals that she wants to accomplish, and plans to share those with her spouse.   Patient may benefit from  counseling.  PLAN: 1. Follow up with behavioral health clinician on : one week.   [name], [facility], [place]

## 2018-12-03 ENCOUNTER — Telehealth: Payer: Self-pay | Admitting: Dietician

## 2018-12-03 DIAGNOSIS — E119 Type 2 diabetes mellitus without complications: Secondary | ICD-10-CM

## 2018-12-04 DIAGNOSIS — K641 Second degree hemorrhoids: Secondary | ICD-10-CM | POA: Diagnosis not present

## 2018-12-04 NOTE — Telephone Encounter (Signed)
Kristen Frost returned my call. She says she does not have an eye doctor. She has not had an eye exam for "Over 2 years". She agrees to have a referral made.

## 2018-12-05 NOTE — Addendum Note (Signed)
Addended by: Ina Homes T on: 12/05/2018 09:06 AM   Modules accepted: Orders

## 2018-12-11 ENCOUNTER — Other Ambulatory Visit: Payer: Self-pay

## 2018-12-11 ENCOUNTER — Ambulatory Visit (INDEPENDENT_AMBULATORY_CARE_PROVIDER_SITE_OTHER): Payer: Medicaid Other | Admitting: Licensed Clinical Social Worker

## 2018-12-11 DIAGNOSIS — F419 Anxiety disorder, unspecified: Secondary | ICD-10-CM

## 2018-12-11 NOTE — BH Specialist Note (Signed)
Integrated Behavioral Health Follow Up Visit  MRN: [id number] Name: Kristen Frost  Number of [place] Clinician visits: 3/6 Session Start time: 9:00  Session End time: 9:50 Total time: 50   Type of Service: [place] Interpretor:No.   SUBJECTIVE: Kristen Frost is a 63 y.o. female whom attended the session indivdiually.  Patient was referred by Dr. Tarri Abernethy for anxiety. Patient reports the following symptoms/concerns: anxiety, family stressors, and sadness. Duration of problem: recurrent since 2010; Severity of problem: moderate  OBJECTIVE: Mood: Anxious and Affect: Appropriate Risk of harm to self or others: No plan to harm self or others  LIFE CONTEXT: Family and Social: Patient processed her relationship with her spouse, and what improvements have been made over the years. Patient identified areas for improvement.   GOALS ADDRESSED: Patient will: 1.  Reduce symptoms of: agitation, anxiety, depression and stress  2.  Increase knowledge and/or ability of: coping skills, healthy habits and stress reduction  3.  Demonstrate ability to: Increase healthy adjustment to current life circumstances and Increase adequate support systems for patient/family  INTERVENTIONS: Interventions utilized:  Motivational Interviewing, Solution-Focused Strategies, Mindfulness or Psychologist, educational, Brief CBT and Supportive Counseling Standardized Assessments completed: Not Needed  ASSESSMENT: Patient currently experiencing moderate levels of depression and anxiety. 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 Patient wants to increase independence, freedom, less judgement from others, and decrease the feelings of being a burden. Patient identified communication issues in her marriage that are problematic. Patient feels she is unable to express herself without being judged or dismissed at times.   Patient is addimit  that she wants to purchase a home of her own. Patient discussed plans to help her move towards that goal.   Patient may benefit from counseling.   PLAN: 1. Follow up with behavioral health clinician on : one week.   [name], [facility], [place]

## 2018-12-12 DIAGNOSIS — H0288A Meibomian gland dysfunction right eye, upper and lower eyelids: Secondary | ICD-10-CM | POA: Diagnosis not present

## 2018-12-12 DIAGNOSIS — E119 Type 2 diabetes mellitus without complications: Secondary | ICD-10-CM | POA: Diagnosis not present

## 2018-12-12 DIAGNOSIS — H25813 Combined forms of age-related cataract, bilateral: Secondary | ICD-10-CM | POA: Diagnosis not present

## 2018-12-12 DIAGNOSIS — H524 Presbyopia: Secondary | ICD-10-CM | POA: Diagnosis not present

## 2018-12-12 DIAGNOSIS — H5203 Hypermetropia, bilateral: Secondary | ICD-10-CM | POA: Diagnosis not present

## 2018-12-12 DIAGNOSIS — H1045 Other chronic allergic conjunctivitis: Secondary | ICD-10-CM | POA: Diagnosis not present

## 2018-12-12 DIAGNOSIS — H0288B Meibomian gland dysfunction left eye, upper and lower eyelids: Secondary | ICD-10-CM | POA: Diagnosis not present

## 2018-12-12 LAB — HM DIABETES EYE EXAM

## 2018-12-15 ENCOUNTER — Encounter: Payer: Self-pay | Admitting: *Deleted

## 2018-12-15 DIAGNOSIS — H5213 Myopia, bilateral: Secondary | ICD-10-CM | POA: Diagnosis not present

## 2018-12-17 ENCOUNTER — Encounter: Payer: Self-pay | Admitting: Licensed Clinical Social Worker

## 2018-12-23 ENCOUNTER — Encounter: Payer: Self-pay | Admitting: Licensed Clinical Social Worker

## 2018-12-23 ENCOUNTER — Other Ambulatory Visit: Payer: Self-pay

## 2018-12-23 ENCOUNTER — Ambulatory Visit (INDEPENDENT_AMBULATORY_CARE_PROVIDER_SITE_OTHER): Payer: Medicaid Other | Admitting: Licensed Clinical Social Worker

## 2018-12-23 DIAGNOSIS — F419 Anxiety disorder, unspecified: Secondary | ICD-10-CM

## 2018-12-23 NOTE — BH Specialist Note (Signed)
Integrated Behavioral Health Follow Up Visit  MRN: [id number] Name: Kristen Frost  Number of [place] Clinician visits: 4/6 Session Start time: 9:20  Session End time: 10:00 Total time: 40   Type of Service: [place] Interpretor:No.   SUBJECTIVE: Kristen Frost is a 63 y.o. female accompanied by whom attended the session individually.  Patient was referred by Dr. Tarri Abernethy for anxiety and mild sadness. Patient reports the following symptoms/concerns: anxiety, family stressors, and sadness.  Duration of problem: recurrent since 2010; Severity of problem: moderate  OBJECTIVE: Mood: Anxious and Affect: Appropriate Risk of harm to self or others: No plan to harm self or others  LIFE CONTEXT: Family and Social: Patient continues to report feeling unheard in her relationship with her spouse.   GOALS ADDRESSED: Patient will: 1.  Reduce symptoms of: anxiety, depression and stress  2.  Increase knowledge and/or ability of: coping skills, healthy habits and stress reduction  3.  Demonstrate ability to: Increase healthy adjustment to current life circumstances and Increase adequate support systems for patient/family  INTERVENTIONS: Interventions utilized:  Motivational Interviewing, Solution-Focused Strategies and Supportive Counseling Standardized Assessments completed: Not Needed  ASSESSMENT: Patient currently experiencing moderate levels of anxiety with mild depressive symptoms. Patient is declining any panic. Patient reported feeling sad and the need to cry at times, but she is unable to release any tears. Patient identified that he "needs to move into a house of her own", and this will resolve many of her stressors. Patient reported that she does not feel heard in her marriage, and provided examples of how her spouse places emphasis on others needs before her own. Patient identified her marriage strife as her main trigger for  anxiety and sadness. Patient processed ways that she can increase her independence, and goals for her future.   Patient may benefit from counseling.  PLAN: 1. Follow up with behavioral health clinician on : one week.   [name], [facility], [place]

## 2018-12-25 ENCOUNTER — Encounter: Payer: Self-pay | Admitting: Internal Medicine

## 2018-12-25 ENCOUNTER — Ambulatory Visit: Payer: Medicaid Other | Admitting: Internal Medicine

## 2018-12-25 ENCOUNTER — Other Ambulatory Visit: Payer: Self-pay

## 2018-12-25 VITALS — BP 124/73 | HR 91 | Temp 98.0°F | Ht 61.5 in | Wt 253.6 lb

## 2018-12-25 DIAGNOSIS — Z6841 Body Mass Index (BMI) 40.0 and over, adult: Secondary | ICD-10-CM | POA: Diagnosis not present

## 2018-12-25 DIAGNOSIS — Z7984 Long term (current) use of oral hypoglycemic drugs: Secondary | ICD-10-CM | POA: Diagnosis not present

## 2018-12-25 DIAGNOSIS — Z79899 Other long term (current) drug therapy: Secondary | ICD-10-CM | POA: Diagnosis not present

## 2018-12-25 DIAGNOSIS — I10 Essential (primary) hypertension: Secondary | ICD-10-CM | POA: Diagnosis not present

## 2018-12-25 DIAGNOSIS — E119 Type 2 diabetes mellitus without complications: Secondary | ICD-10-CM

## 2018-12-25 LAB — POCT GLYCOSYLATED HEMOGLOBIN (HGB A1C): Hemoglobin A1C: 6.1 % — AB (ref 4.0–5.6)

## 2018-12-25 LAB — GLUCOSE, CAPILLARY: Glucose-Capillary: 98 mg/dL (ref 70–99)

## 2018-12-25 MED ORDER — LIRAGLUTIDE 18 MG/3ML ~~LOC~~ SOPN: 1.2000 mg | PEN_INJECTOR | Freq: Every day | SUBCUTANEOUS | 4 refills | Status: DC

## 2018-12-25 MED ORDER — LIRAGLUTIDE 18 MG/3ML ~~LOC~~ SOPN: 1.8000 mg | PEN_INJECTOR | Freq: Every day | SUBCUTANEOUS | 4 refills | Status: DC

## 2018-12-25 MED ORDER — PEN NEEDLES 31G X 5 MM MISC: 1.0000 "application " | Freq: Every day | 1 refills | Status: DC

## 2018-12-25 NOTE — Assessment & Plan Note (Signed)
HPI:  patient with well-controlled hypertension. She is currently on amlodipine. She is not experiencing any adverse effects including constipation or lower extremity edema. Reviewed her last BMP with stable renal function.  A/P: - Continue Amlodipine 10 mg QD

## 2018-12-25 NOTE — Assessment & Plan Note (Signed)
Patient was seen in August for morbid obesity. She had expressed interest in weight loss. She started walking for greater than 30 minutes a day and doing chair exercises while watching TV. She is attempting to change her diet little by little. She is cut out beverages that have hidden calories. She is not interested in bariatric surgery. At her last visit we started her on Victoza with directions to increase to 1.2 mg daily. Since her last visit she is tolerating this medication well without any adverse effects including upset stomach.  On chart review she has lost 8 pounds since her last visit.  A/P: - Discussed continuing lifestyle modifications including diet and exercise. - Congratulated her on her weight loss. - Increase the Victoza to 1.8 mg QD - Follow-up in 3 months

## 2018-12-25 NOTE — Patient Instructions (Signed)
Thank you for allowing me to provide your care. Keep up the great work with your diet and exercise. Today we are going to increase your Victoza to 1.8 mg once a day. I will send out refills. I will also look into these different cosmetic options.  Continue all your other medications as prescribed. We will check your A1c and your urine today. I will call you if we need to make any changes to her medications.  I would like to see you back in three months so we can continue to work on your weight loss together. If you have any questions or concerns before then please do not hesitate to call.

## 2018-12-25 NOTE — Progress Notes (Signed)
   CC: DM, HTN, Obesity  HPI:  [name] is a 63 y.o. female with PMHx listed below presenting for DM, HTN, Obesity. Please see the A&P for the status of the patient's chronic medical problems.  Past Medical History:  Diagnosis Date  . Arthritis   . Bilateral thigh pain 11/17/2015  . BV (bacterial vaginosis) 05/19/2016  . Hypertension   . Hypokalemia 06/07/2016  . Pap smear for cervical cancer screening 05/17/2016  . Restless leg syndrome   . Sinus congestion 05/17/2016  . Sleep apnea   . Symptomatic cholelithiasis 06/05/2016  . Trapezius muscle strain 10/23/2016   Review of Systems:  Performed and all others negative.  Physical Exam: Vitals:   12/25/18 1324  BP: 124/73  Pulse: 91  Temp: 98 F (36.7 C)  TempSrc: Oral  SpO2: 98%  Weight: 253 lb 9.6 oz (115 kg)  Height: 5' 1.5" (1.562 m)   General: Morbidly obese female in no acute distress Pulm: Good air movement with no wheezing or crackles  CV: RRR, no murmurs, no rubs   Assessment & Plan:   See Encounters Tab for problem based charting.  Patient discussed with Dr. Dareen Piano

## 2018-12-25 NOTE — Assessment & Plan Note (Signed)
HPI:  Patient with well-controlled type II diabetes. She is currently on metformin 1000 mg once daily and Victoza 1.2 mg QD. The Victoza was started at our last visit and she is tolerating it well without any upset stomach or G.I. complaints. She continues to work on diet and is trying to cut out beverages with calories in them. She started to walk daily and as chair exercises when watching TV. She has not had any symptoms consistent with hypoglycemia.  A/P: - Controlled. A1c 6.1 today.  - Continue Metformin 1000 mg once daily - Increase Victoza to 1.8mg  for added weight loss - Recheck your urine microalbumin. If it is elevated she will need to start ACE/ARB therapy - Continue Atorvastatin 40 mg QD

## 2018-12-26 LAB — MICROALBUMIN / CREATININE URINE RATIO
Creatinine, Urine: 123.5 mg/dL
Microalb/Creat Ratio: 32 mg/g creat — ABNORMAL HIGH (ref 0–29)
Microalbumin, Urine: 39.5 ug/mL

## 2018-12-26 NOTE — Progress Notes (Signed)
Internal Medicine Clinic Attending  Case discussed with Dr. Helberg at the time of the visit.  We reviewed the resident's history and exam and pertinent patient test results.  I agree with the assessment, diagnosis, and plan of care documented in the resident's note.    

## 2018-12-30 ENCOUNTER — Ambulatory Visit: Payer: Medicaid Other | Admitting: Licensed Clinical Social Worker

## 2019-01-13 ENCOUNTER — Encounter: Payer: Self-pay | Admitting: Internal Medicine

## 2019-02-03 DIAGNOSIS — H524 Presbyopia: Secondary | ICD-10-CM | POA: Diagnosis not present

## 2019-02-04 ENCOUNTER — Other Ambulatory Visit: Payer: Self-pay

## 2019-02-04 ENCOUNTER — Ambulatory Visit (INDEPENDENT_AMBULATORY_CARE_PROVIDER_SITE_OTHER): Payer: Medicaid Other | Admitting: Licensed Clinical Social Worker

## 2019-02-04 ENCOUNTER — Encounter: Payer: Self-pay | Admitting: Licensed Clinical Social Worker

## 2019-02-04 DIAGNOSIS — F419 Anxiety disorder, unspecified: Secondary | ICD-10-CM

## 2019-02-04 NOTE — BH Specialist Note (Signed)
Integrated Behavioral Health Visit via Telemedicine (Telephone)  02/04/2019 [place] Riling [id number]   Session Start time: 11:00  Session End time: 11:30 Total time: 30 minutes  Referring Provider: Dr. Tarri Abernethy Type of Visit: Telephonic Patient location: Home [facility] Provider location: Office All persons participating in visit: Patient and [facility]  Confirmed patient's address: Yes  Confirmed patient's phone number: Yes  Any changes to demographics: No   Discussed confidentiality: Yes    The following statements were read to the patient and/or legal guardian that are established with the [facility] Provider.  "The purpose of this phone visit is to provide behavioral health care while limiting exposure to the coronavirus (COVID19).  There is a possibility of technology failure and discussed alternative modes of communication if that failure occurs."  "By engaging in this telephone visit, you consent to the provision of healthcare.  Additionally, you authorize for your insurance to be billed for the services provided during this telephone visit."   Patient and/or legal guardian consented to telephone visit: Yes   PRESENTING CONCERNS: Patient and/or family reports the following symptoms/concerns: anxiety, family stressors, and sadness. Duration of problem: recurrent since 2010; Severity of problem: moderate  GOALS ADDRESSED: Patient will: 1.  Reduce symptoms of: agitation, anxiety, depression and stress  2.  Increase knowledge and/or ability of: coping skills, healthy habits and stress reduction  3.  Demonstrate ability to: Increase healthy adjustment to current life circumstances and Increase adequate support systems for patient/family  INTERVENTIONS: Interventions utilized:  Motivational Interviewing, Solution-Focused Strategies, Brief CBT and Supportive Counseling Standardized Assessments completed: Not Needed  ASSESSMENT: Patient currently experiencing increased  anxiety levels due to environmental stressors. Patient was recently re-triggered with the fear of losing her home. Patient processed her frustrations towards her living situation and dynamics with her spouse. Patient is tired of giving, and feels her needs are ignored by her natural supports. Patient continues to want to move, and feels stuck in her home. Patient identified that she needs additional income, and has established that as a goal.   Patient may benefit from counseling.  PLAN: 1. Follow up with behavioral health clinician on : two weeks.  [name], [facility], [place]

## 2019-02-05 ENCOUNTER — Other Ambulatory Visit: Payer: Self-pay

## 2019-02-05 ENCOUNTER — Encounter: Payer: Self-pay | Admitting: Pulmonary Disease

## 2019-02-05 ENCOUNTER — Ambulatory Visit: Payer: Medicaid Other | Admitting: Pulmonary Disease

## 2019-02-05 VITALS — BP 120/68 | HR 101 | Temp 98.2°F | Ht 61.5 in | Wt 249.0 lb

## 2019-02-05 DIAGNOSIS — E669 Obesity, unspecified: Secondary | ICD-10-CM

## 2019-02-05 DIAGNOSIS — Z9989 Dependence on other enabling machines and devices: Secondary | ICD-10-CM | POA: Diagnosis not present

## 2019-02-05 DIAGNOSIS — Z7189 Other specified counseling: Secondary | ICD-10-CM | POA: Diagnosis not present

## 2019-02-05 DIAGNOSIS — G4733 Obstructive sleep apnea (adult) (pediatric): Secondary | ICD-10-CM

## 2019-02-05 DIAGNOSIS — G4730 Sleep apnea, unspecified: Secondary | ICD-10-CM

## 2019-02-05 NOTE — Progress Notes (Signed)
[place]  Chief Complaint  Patient presents with  . Follow-up    Patient is here for ROV. Patient is feeling good overall.    Constitutional:  BP 120/68 (BP Location: Right Arm, Patient Position: Sitting, Cuff Size: Large)   Pulse (!) 101   Temp 98.2 F (36.8 C) (Temporal)   Ht 5' 1.5" (1.562 m)   Wt 249 lb (112.9 kg)   SpO2 96% Comment: on RA  BMI 46.29 kg/m   Past Medical History:  Arthritis, RLS, Anemia, Cholelithiasis, HTN, DM  Brief Summary:  Kristen Frost is a 63 y.o. female with obstructive sleep apnea.  She has been doing well with CPAP.  Has full face mask.  No issues with sinus congestion, sore throat, dry mouth, or aerophagia.  Sleeps through the night.  Feels rested during the day.  Was started on victoza recently for DM.  This has helped her lose a few pounds.   Physical Exam:   Appearance - well kempt   ENMT - no sinus tenderness, no nasal discharge, no oral exudate, wears dentures  Neck - no masses, trachea midline, no thyromegaly, no elevation in JVP  Respiratory - normal appearance of chest wall, normal respiratory effort w/o accessory muscle use, no dullness on percussion, no wheezing or rales  CV - s1s2 regular rate and rhythm, no murmurs, no peripheral edema, radial pulses symmetric  GI - soft, non tender  Lymph - no adenopathy noted in neck and axillary areas  MSK - normal gait  Ext - no cyanosis, clubbing, or joint inflammation noted  Skin - no rashes, lesions, or ulcers  Neuro - normal strength, oriented x 3  Psych - normal mood and affect    Assessment/Plan:   Obstructive sleep apnea. - she is compliant with CPAP and reports benefit from therapy - continue auto CPAP  Obesity. - encouraged her to keep up with weight loss efforts  Advice regarding COVID 19. - discussed pros/cons and known side effect profile for various approved COVID 19 vaccines - she plans to defer vaccination  until more information is learned about response and side effects   Patient Instructions  Follow up in 1 year    Chesley Mires, MD [place] Pager: [phone] 02/05/2019, 10:06 AM  Flow Sheet    Sleep tests:  PSG 08/20/09 >> AHI 26 PSG 04/25/16 >> AHI 16.4, SpO2 low 88%.  CPAP 9 cm H2O Auto CPAP 01/05/19 to 02/03/19 >> used on 29 of 30 nights with average 6 hrs 55 min.  Average AHI 0.7 with median CPAP 10 and 95 th percentile CPAP 13 cm H2O.  Medications:   Allergies as of 02/05/2019      Reactions   No Known Allergies       Medication List       Accurate as of February 05, 2019 10:06 AM. If you have any questions, ask your nurse or doctor.        STOP taking these medications   hydrocortisone cream 1 % Stopped by: Chesley Mires, MD     TAKE these medications   amLODipine 10 MG tablet Commonly known as: NORVASC TAKE 1 TABLET BY MOUTH DAILY   atorvastatin 40 MG tablet Commonly known as: Lipitor Take 1 tablet (40 mg total) by mouth daily.   ferrous sulfate 325 (65 FE) MG tablet Take 1 tablet (325 mg total) by mouth daily with breakfast.   fluticasone 50 MCG/ACT nasal spray Commonly known as:  FLONASE Place 1 spray into both nostrils daily.   liraglutide 18 MG/3ML Sopn Commonly known as: VICTOZA Inject 0.3 mLs (1.8 mg total) into the skin daily.   metFORMIN 500 MG 24 hr tablet Commonly known as: GLUCOPHAGE-XR Take 2 tablets (1,000 mg total) by mouth daily with breakfast.   Pen Needles 31G X 5 MM Misc 1 application by Does not apply route daily.   Vitamin D 50 MCG (2000 UT) Caps Take 1 capsule (2,000 Units total) by mouth daily.       Past Surgical History:  She  has a past surgical history that includes Rotator cuff repair (2007/2008) and Cholecystectomy (N/A, 06/07/2016).  Family History:  Her family history includes Alcohol abuse in her father; Allergies in her father and another family member; Asthma in an other family  member; Gout in an other family member; Hypertension in an other family member; Migraines in an other family member; Rashes / Skin problems in an other family member; Tuberculosis in her father.  Social History:  She  reports that she has never smoked. She has never used smokeless tobacco. She reports that she does not drink alcohol or use drugs.

## 2019-02-05 NOTE — Patient Instructions (Signed)
Follow up in 1 year.

## 2019-02-18 ENCOUNTER — Ambulatory Visit: Payer: Medicaid Other | Admitting: Licensed Clinical Social Worker

## 2019-02-18 ENCOUNTER — Telehealth: Payer: Self-pay | Admitting: Licensed Clinical Social Worker

## 2019-02-18 NOTE — Telephone Encounter (Signed)
Patient was called for her scheduled visit. Patient needed to be rescheduled. A future appointment was made for 1/7.

## 2019-02-24 ENCOUNTER — Other Ambulatory Visit: Payer: Self-pay | Admitting: Internal Medicine

## 2019-02-24 DIAGNOSIS — E119 Type 2 diabetes mellitus without complications: Secondary | ICD-10-CM

## 2019-02-24 DIAGNOSIS — K641 Second degree hemorrhoids: Secondary | ICD-10-CM | POA: Diagnosis not present

## 2019-02-24 NOTE — Telephone Encounter (Signed)
Refill Request   metFORMIN (GLUCOPHAGE-XR) 500 MG 24 hr tablet  [facility] [place], [place] - [address] AT [place]

## 2019-02-25 MED ORDER — METFORMIN HCL ER 500 MG PO TB24: 1000.0000 mg | ORAL_TABLET | Freq: Every day | ORAL | 1 refills | Status: DC

## 2019-02-26 ENCOUNTER — Encounter: Payer: Self-pay | Admitting: Licensed Clinical Social Worker

## 2019-02-26 ENCOUNTER — Ambulatory Visit: Payer: Self-pay | Admitting: General Surgery

## 2019-02-26 ENCOUNTER — Ambulatory Visit (INDEPENDENT_AMBULATORY_CARE_PROVIDER_SITE_OTHER): Payer: Medicaid Other | Admitting: Licensed Clinical Social Worker

## 2019-02-26 ENCOUNTER — Other Ambulatory Visit: Payer: Self-pay

## 2019-02-26 DIAGNOSIS — F419 Anxiety disorder, unspecified: Secondary | ICD-10-CM

## 2019-02-26 NOTE — BH Specialist Note (Signed)
Integrated Behavioral Health Visit via Telemedicine (Telephone)  02/26/2019 Haskell Riling [id number]   Session Start time: 12:35  Session End time: 1:05 Total time: 30 minutes  Referring Provider: Dr. Tarri Abernethy Type of Visit: Telephonic Patient location: Home [facility] Provider location: Office All persons participating in visit: Patient and [facility]  Confirmed patient's address: Yes  Confirmed patient's phone number: Yes  Any changes to demographics: No   Discussed confidentiality: Yes    The following statements were read to the patient and/or legal guardian that are established with the [facility] Provider.  "The purpose of this phone visit is to provide behavioral health care while limiting exposure to the coronavirus (COVID19).  There is a possibility of technology failure and discussed alternative modes of communication if that failure occurs."  "By engaging in this telephone visit, you consent to the provision of healthcare.  Additionally, you authorize for your insurance to be billed for the services provided during this telephone visit."   Patient and/or legal guardian consented to telephone visit: Yes   PRESENTING CONCERNS: Patient and/or family reports the following symptoms/concerns: anxiety, family stressors, and sadness. Duration of problem: recurrent since 2010; Severity of problem: mild   GOALS ADDRESSED: Patient will: 1.  Reduce symptoms of: anxiety, depression and stress  2.  Increase knowledge and/or ability of: coping skills, healthy habits and stress reduction  3.  Demonstrate ability to: Increase healthy adjustment to current life circumstances and Increase adequate support systems for patient/family  INTERVENTIONS: Interventions utilized:  Motivational Interviewing, Solution-Focused Strategies, Brief CBT and Supportive Counseling Standardized Assessments completed: Not Needed  ASSESSMENT: Patient currently experiencing decreased anxiety and sadness  since the previous session. Patient implemented healthy communication to discuss difficult topics with her spouse, and they both were able to identify an agreeable solution.  Patient reported feeling relief from this open communication and feeling heard by her spouse.    Patient is more hopeful regarding the future, and processed new goals she has put into place.   Patient may benefit from counseling.  PLAN: 1. Follow up with behavioral health clinician on : one week.   [name], [facility], [place]

## 2019-03-05 ENCOUNTER — Other Ambulatory Visit: Payer: Self-pay

## 2019-03-05 ENCOUNTER — Encounter: Payer: Self-pay | Admitting: Licensed Clinical Social Worker

## 2019-03-05 ENCOUNTER — Ambulatory Visit (INDEPENDENT_AMBULATORY_CARE_PROVIDER_SITE_OTHER): Payer: Medicaid Other | Admitting: Licensed Clinical Social Worker

## 2019-03-05 DIAGNOSIS — F419 Anxiety disorder, unspecified: Secondary | ICD-10-CM

## 2019-03-05 NOTE — BH Specialist Note (Signed)
Integrated Behavioral Health Visit via Telemedicine (Telephone)  03/05/2019 Kristen Frost [id number]   Session Start time: 10:00  Session End time: 10:30 Total time: 30 minutes  Referring Provider: Dr. Tarri Abernethy Type of Visit: Telephonic Patient location: Home [facility] Provider location: Office All persons participating in visit: Patient and [facility]  Confirmed patient's address: Yes  Confirmed patient's phone number: Yes  Any changes to demographics: No   Discussed confidentiality: Yes    The following statements were read to the patient and/or legal guardian that are established with the [facility] Provider.  "The purpose of this phone visit is to provide behavioral health care while limiting exposure to the coronavirus (COVID19).  There is a possibility of technology failure and discussed alternative modes of communication if that failure occurs."  "By engaging in this telephone visit, you consent to the provision of healthcare.  Additionally, you authorize for your insurance to be billed for the services provided during this telephone visit."   Patient and/or legal guardian consented to telephone visit: Yes   PRESENTING CONCERNS: Patient and/or family reports the following symptoms/concerns: anxiety, family stressors, and sadness.  Duration of problem: recurrent since 2010; Severity of problem: mild  STRENGTHS (Protective Factors/Coping Skills): Created healthy goals recently.   GOALS ADDRESSED: Patient will: 1.  Reduce symptoms of: agitation, anxiety, depression and stress  2.  Increase knowledge and/or ability of: coping skills, healthy habits, self-management skills and stress reduction  3.  Demonstrate ability to: Increase healthy adjustment to current life circumstances and Increase adequate support systems for patient/family  INTERVENTIONS: Interventions utilized:  Motivational Interviewing, Solution-Focused Strategies, Behavioral Activation, Brief CBT and  Supportive Counseling Standardized Assessments completed: Not Needed  ASSESSMENT: Patient currently experiencing decreased sadness and anxiety. Patient is focused on improving herself, and make strides towards fulfilling her personal goals.  Patient is letting go of ressentiment, and she has increased her communication with others in a positive way. Patient is very focused, and goal-oriented.    Patient may benefit from counseling.  PLAN: 1. Follow up with behavioral health clinician on : one week.    [name], [facility], [place]

## 2019-03-18 ENCOUNTER — Ambulatory Visit (INDEPENDENT_AMBULATORY_CARE_PROVIDER_SITE_OTHER): Payer: Medicaid Other | Admitting: Licensed Clinical Social Worker

## 2019-03-18 ENCOUNTER — Other Ambulatory Visit: Payer: Self-pay

## 2019-03-18 ENCOUNTER — Encounter: Payer: Self-pay | Admitting: Licensed Clinical Social Worker

## 2019-03-18 DIAGNOSIS — F419 Anxiety disorder, unspecified: Secondary | ICD-10-CM

## 2019-03-18 NOTE — BH Specialist Note (Signed)
Integrated Behavioral Health Visit via Telemedicine (Telephone)  03/18/2019 Haskell Riling [id number]   Session Start time: 9:00  Session End time: 9:30 Total time: 30 minutes  Referring Provider: Dr. Tarri Abernethy Type of Visit: Telephonic Patient location: Home [facility] Provider location: Office All persons participating in visit: Patient and [facility]  Confirmed patient's address: Yes  Confirmed patient's phone number: Yes  Any changes to demographics: No   Discussed confidentiality: Yes    The following statements were read to the patient and/or legal guardian that are established with the [facility] Provider.  "The purpose of this phone visit is to provide behavioral health care while limiting exposure to the coronavirus (COVID19).  There is a possibility of technology failure and discussed alternative modes of communication if that failure occurs."  "By engaging in this telephone visit, you consent to the provision of healthcare.  Additionally, you authorize for your insurance to be billed for the services provided during this telephone visit."   Patient and/or legal guardian consented to telephone visit: Yes   PRESENTING CONCERNS: Patient and/or family reports the following symptoms/concerns: anxiety, family stressors, and sadness.  Duration of problem: recurrent since 2010; Severity of problem: mild  STRENGTHS (Protective Factors/Coping Skills): Working on building her faith. Started [employer].  GOALS ADDRESSED: Patient will: 1.  Reduce symptoms of: anxiety, depression and stress  2.  Increase knowledge and/or ability of: coping skills, healthy habits and stress reduction  3.  Demonstrate ability to: Increase healthy adjustment to current life circumstances and Increase adequate support systems for patient/family  INTERVENTIONS: Interventions utilized:  Motivational Interviewing, Solution-Focused Strategies, Brief CBT and Supportive Counseling Standardized Assessments  completed: Not Needed  ASSESSMENT: Patient currently experiencing continued progress with managing her anxiety and sadness. Patient is continuing to focus on improving her self, and finding new hobbies/goals. Patient is setting realistic goals, and practicing flexibility. Patient's relationships are improving with her family. Patient is reporting very little anxiety, distress, or sadness for the past several weeks.   Patient may benefit from counseling.  PLAN: 1. Follow up with behavioral health clinician on : three weeks.  [name], [facility], [place]

## 2019-03-26 ENCOUNTER — Encounter: Payer: Medicaid Other | Admitting: Internal Medicine

## 2019-03-28 ENCOUNTER — Other Ambulatory Visit (HOSPITAL_COMMUNITY): Payer: Medicaid Other

## 2019-04-07 ENCOUNTER — Telehealth: Payer: Self-pay | Admitting: Licensed Clinical Social Worker

## 2019-04-07 ENCOUNTER — Ambulatory Visit: Payer: Medicaid Other | Admitting: Licensed Clinical Social Worker

## 2019-04-07 NOTE — Telephone Encounter (Signed)
Patient was called on both numbers. No answer, no vm available.

## 2019-04-09 ENCOUNTER — Encounter: Payer: Medicaid Other | Admitting: Internal Medicine

## 2019-04-14 ENCOUNTER — Telehealth: Payer: Self-pay

## 2019-04-14 NOTE — Telephone Encounter (Signed)
Requesting to speak with yoou ASAP. Please call pt back.

## 2019-04-16 ENCOUNTER — Encounter: Payer: Medicaid Other | Admitting: Internal Medicine

## 2019-04-22 ENCOUNTER — Encounter: Payer: Self-pay | Admitting: Licensed Clinical Social Worker

## 2019-04-22 ENCOUNTER — Other Ambulatory Visit: Payer: Self-pay

## 2019-04-22 ENCOUNTER — Ambulatory Visit (INDEPENDENT_AMBULATORY_CARE_PROVIDER_SITE_OTHER): Payer: Medicaid Other | Admitting: Licensed Clinical Social Worker

## 2019-04-22 DIAGNOSIS — F419 Anxiety disorder, unspecified: Secondary | ICD-10-CM

## 2019-04-22 NOTE — BH Specialist Note (Signed)
Integrated Behavioral Health Visit via Telemedicine (Telephone)  04/22/2019 Haskell Riling [id number]   Session Start time: 9:37  Session End time: 10:00 Total time: 23 minutes  Referring Provider: Dr. Tarri Abernethy Type of Visit: Telephonic Patient location: Home [facility] Provider location: Office All persons participating in visit: Patient, [facility], and [facility] intern  Confirmed patient's address: Yes  Confirmed patient's phone number: Yes  Any changes to demographics: No   Discussed confidentiality: Yes    The following statements were read to the patient and/or legal guardian that are established with the [facility] Provider.  "The purpose of this phone visit is to provide behavioral health care while limiting exposure to the coronavirus (COVID19).  There is a possibility of technology failure and discussed alternative modes of communication if that failure occurs."  "By engaging in this telephone visit, you consent to the provision of healthcare.  Additionally, you authorize for your insurance to be billed for the services provided during this telephone visit."   Patient and/or legal guardian consented to telephone visit: Yes   PRESENTING CONCERNS: Patient and/or family reports the following symptoms/concerns: anxiety, changes in the family system, family stressors, and sadness. Duration of problem: recurrent since 2010, increased over the past two weeks; Severity of problem: mild-moderate  GOALS ADDRESSED: Patient will: 1.  Reduce symptoms of: anxiety, depression and stress  2.  Increase knowledge and/or ability of: coping skills, healthy habits and stress reduction  3.  Demonstrate ability to: Increase healthy adjustment to current life circumstances and Increase adequate support systems for patient/family  INTERVENTIONS: Interventions utilized:  Brief CBT and Supportive Counseling Standardized Assessments completed: Not Needed  ASSESSMENT: Patient currently experiencing  mild to moderate levels of anxiety. Patient just recently had to take on temporary emergency custody of her step-son, and this has caused an increase in stress/adjustment of life style.  Patient processed recent stressors that she has experienced due this change in her family. Patient identified limits she is setting with others to reduce unnecessary stress. Patient is using prayer to help her move through this challenging time. Patient is happy and accepting this situation for "what it is'.   Patient may benefit from counseling.  PLAN: 1. Follow up with behavioral health clinician on : two weeks.   [name], [facility], [place]

## 2019-04-30 ENCOUNTER — Encounter (HOSPITAL_BASED_OUTPATIENT_CLINIC_OR_DEPARTMENT_OTHER): Payer: Self-pay | Admitting: General Surgery

## 2019-04-30 ENCOUNTER — Encounter: Payer: Medicaid Other | Admitting: Internal Medicine

## 2019-04-30 ENCOUNTER — Other Ambulatory Visit: Payer: Self-pay

## 2019-04-30 NOTE — Progress Notes (Signed)
Spoke w/ via phone for pre-op interview--- PT Lab needs dos---- Istat 8 and EKG              Lab results----  no COVID test ------ 05-04-2019 @ 1515 Arrive at ------- 0730 NPO after ------ [place] Medications to take morning of surgery ----- Lipitor, Norvasc w/ sips of water Diabetic medication ----- do not take metformin or do victoza morning of surgery Patient Special Instructions ----- asked to bring cpap/ mask/ tubing dos Pre-Op special Istructions ----- n/a Patient verbalized understanding of instructions that were given at this phone interview. Patient denies shortness of breath, chest pain, fever, cough a this phone interview.   Anesthesia Review:  BMI 46.47 (5'1.5"  250 lb), hx HTN, DM2  PCP:  Dr Posey Pronto (lov 12-25-2018 epic) Cardiologist : no Chest x-ray : no EKG :  no Echo : no Stress test:  Per pt had a stress test approx  2005 , told normal (not available in epic) Cardiac Cath :  no Sleep Study/ CPAP :  YES/ YES Followed by Dr Halford Chessman (lov 02-05-2019 epic)  Fasting Blood Sugar :      / Checks Blood Sugar -- times a day:    Pt stated does not check blood sugar  (last A1c in epic 6.1 on 12-25-2018) Blood Thinner/ Instructions /Last Dose: NO  ASA / Instructions/ Last Dose :  ASA 81mg /  Pt stated was not given any instructions from Dr Marcello Moores whether to stop or not (no instruction in Dr Marcello Moores in her orders either).  Pt verbalized understanding to call Dr Marcello Moores office to get instructions

## 2019-05-04 ENCOUNTER — Other Ambulatory Visit (HOSPITAL_COMMUNITY)
Admission: RE | Admit: 2019-05-04 | Discharge: 2019-05-04 | Disposition: A | Discharge status: Home / Self Care | Payer: Medicaid Other | Source: Ambulatory Visit | Attending: General Surgery | Admitting: General Surgery

## 2019-05-04 ENCOUNTER — Other Ambulatory Visit: Payer: Self-pay | Admitting: *Deleted

## 2019-05-04 DIAGNOSIS — Z20822 Contact with and (suspected) exposure to covid-19: Secondary | ICD-10-CM | POA: Insufficient documentation

## 2019-05-04 DIAGNOSIS — Z01812 Encounter for preprocedural laboratory examination: Secondary | ICD-10-CM | POA: Diagnosis not present

## 2019-05-04 DIAGNOSIS — E785 Hyperlipidemia, unspecified: Secondary | ICD-10-CM

## 2019-05-04 LAB — SARS CORONAVIRUS 2 (TAT 6-24 HRS): SARS Coronavirus 2: NEGATIVE

## 2019-05-04 IMAGING — MG DIGITAL SCREENING BILATERAL MAMMOGRAM WITH CAD
4 series · 4 of 4 positions shown · non-contrast
Comparison: None.

ACR Breast Density Category a: The breast tissue is almost entirely
fatty.

CLINICAL DATA: Screening.

EXAM:
DIGITAL SCREENING BILATERAL MAMMOGRAM WITH CAD

[L MLO]
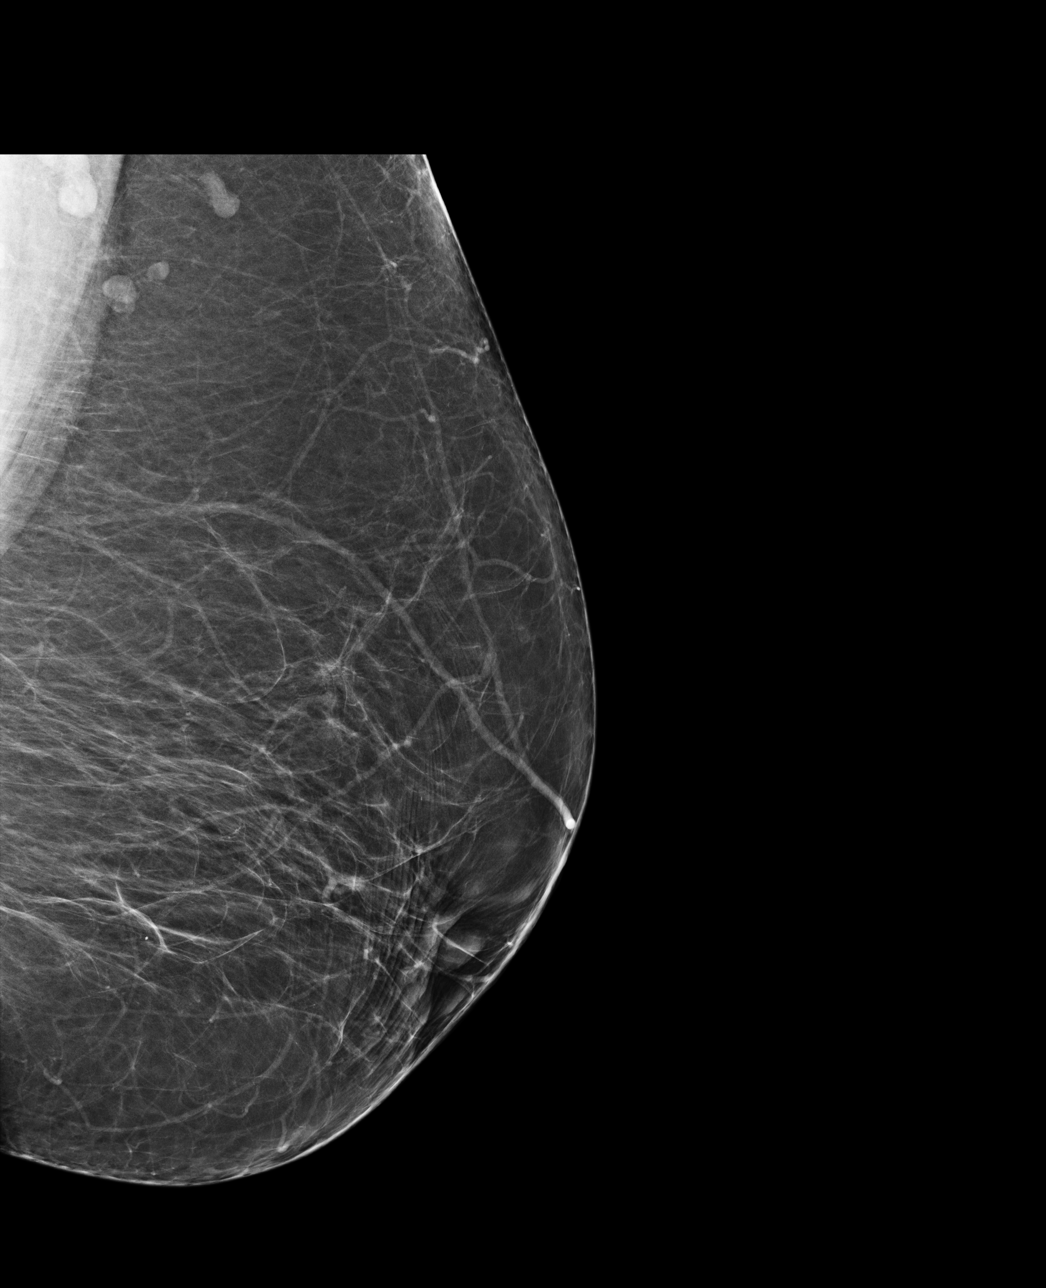

[R CC]
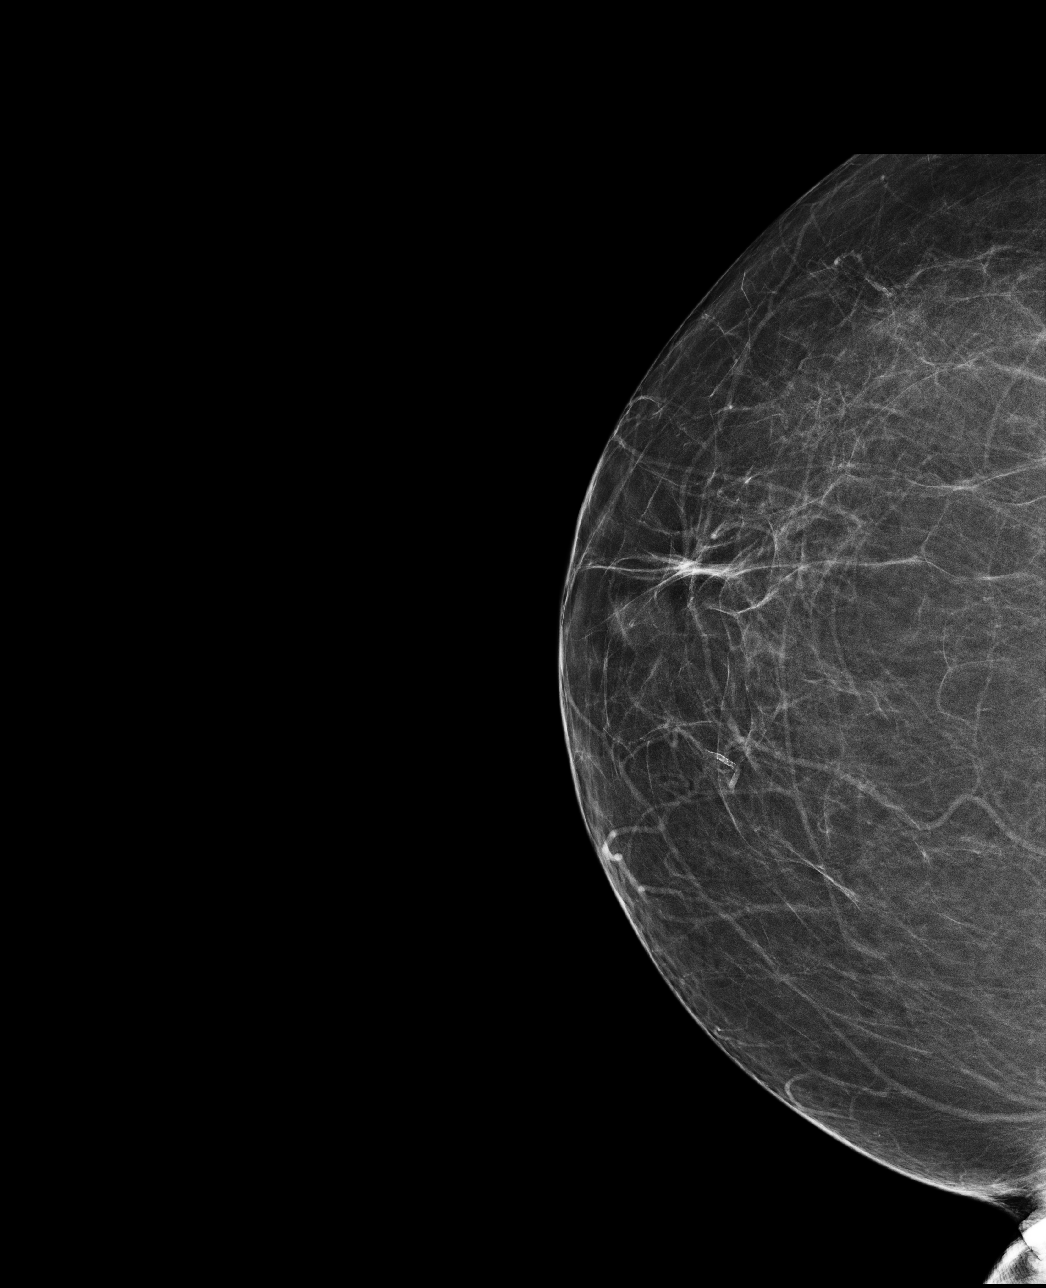

[L CC]
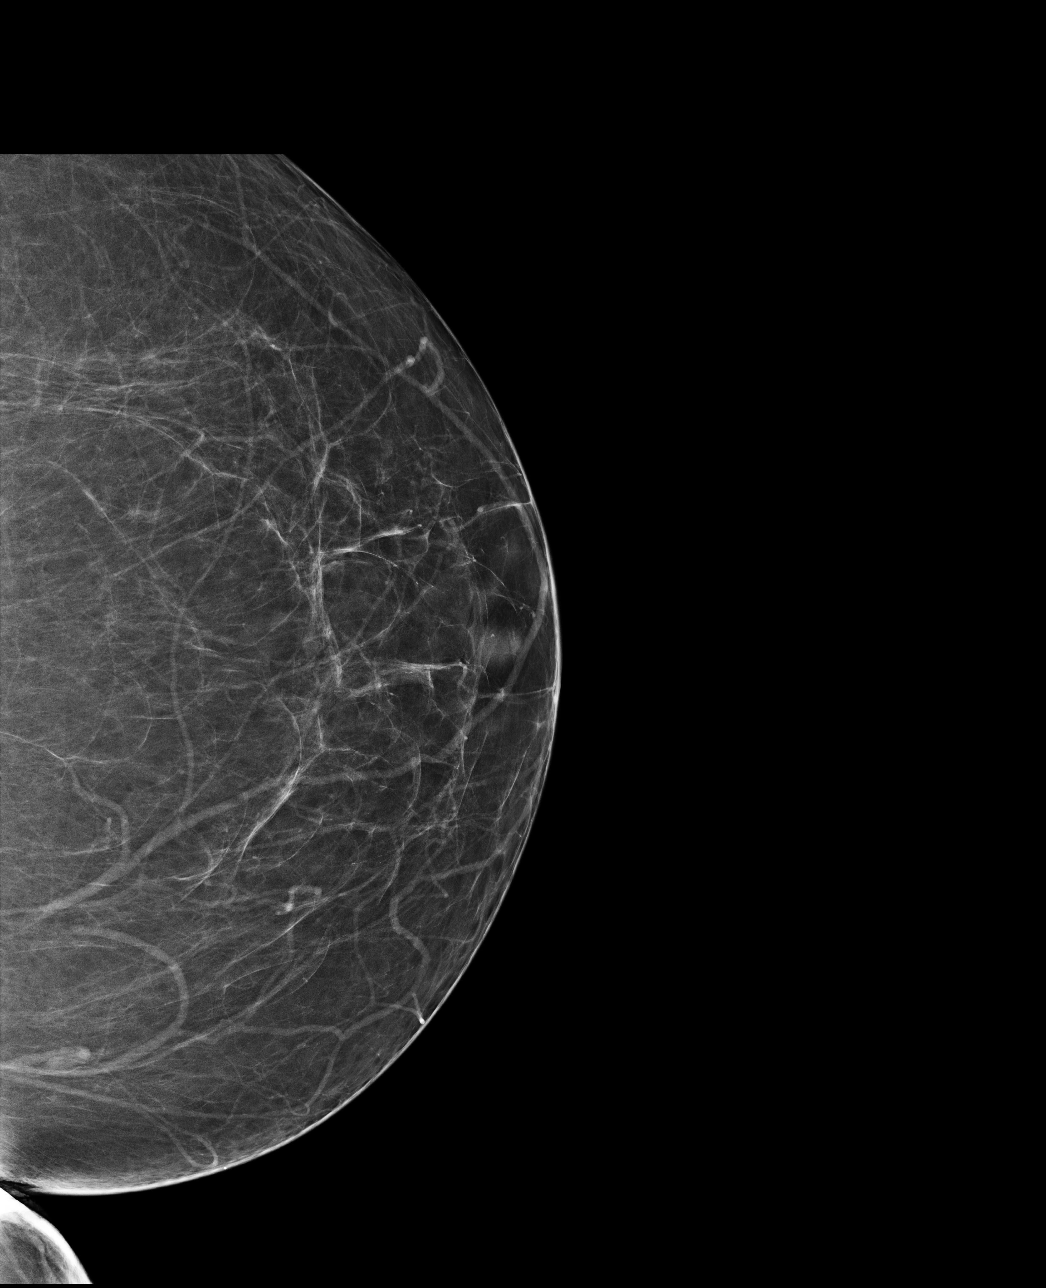

[R MLO]
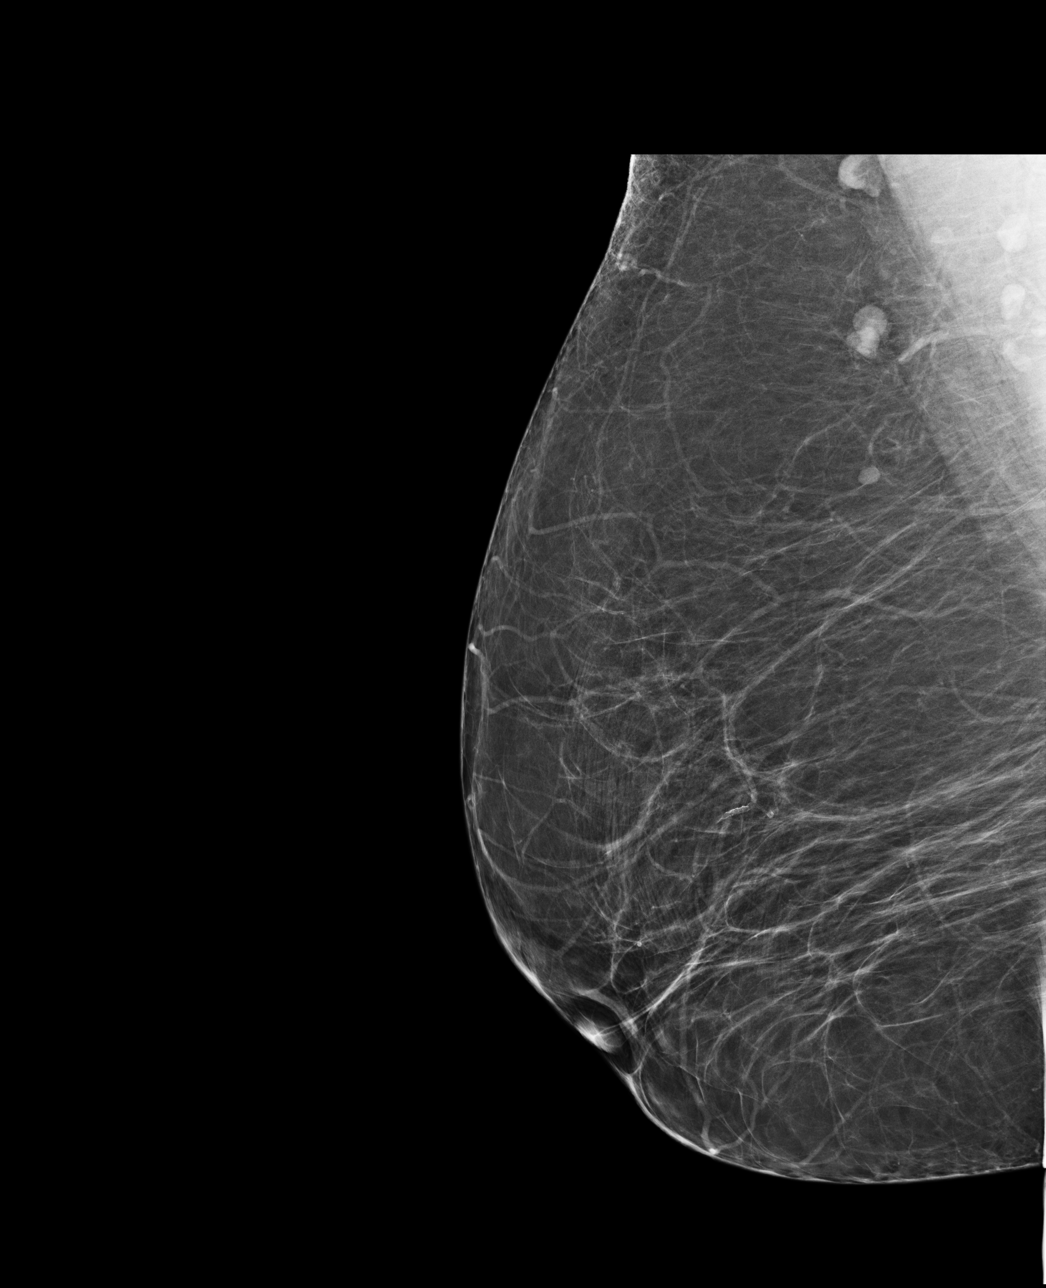

[4 of 4 positions shown; findings below may reference images not displayed]

FINDINGS: There are no findings suspicious for malignancy. Images were
processed with CAD.
IMPRESSION: No mammographic evidence of malignancy. A result letter of this
screening mammogram will be mailed directly to the patient.

RECOMMENDATION:
Screening mammogram in one year. (Code:[id number]-[id number])

BI-RADS CATEGORY  1: Negative.

## 2019-05-04 MED ORDER — ATORVASTATIN CALCIUM 40 MG PO TABS: 40.0000 mg | ORAL_TABLET | Freq: Every day | ORAL | 3 refills | Status: DC

## 2019-05-05 ENCOUNTER — Other Ambulatory Visit: Payer: Self-pay

## 2019-05-05 ENCOUNTER — Ambulatory Visit (INDEPENDENT_AMBULATORY_CARE_PROVIDER_SITE_OTHER): Payer: Medicaid Other | Admitting: Licensed Clinical Social Worker

## 2019-05-05 DIAGNOSIS — F419 Anxiety disorder, unspecified: Secondary | ICD-10-CM

## 2019-05-05 NOTE — BH Specialist Note (Signed)
Integrated Behavioral Health Visit via Telemedicine (Telephone)  05/05/2019 Haskell Riling [id number]   Session Start time: 11:35 Session End time: 12:00 Total time: 25 minutes  Referring Provider: Dr. Tarri Abernethy Type of Visit: Telephonic Patient location: Home [facility] Provider location: Office All persons participating in visit: Patient and [facility]  Confirmed patient's address: Yes  Confirmed patient's phone number: Yes  Any changes to demographics: No   Discussed confidentiality: Yes    The following statements were read to the patient and/or legal guardian that are established with the [facility] Provider.  "The purpose of this phone visit is to provide behavioral health care while limiting exposure to the coronavirus (COVID19).  There is a possibility of technology failure and discussed alternative modes of communication if that failure occurs."  "By engaging in this telephone visit, you consent to the provision of healthcare.  Additionally, you authorize for your insurance to be billed for the services provided during this telephone visit."   Patient and/or legal guardian consented to telephone visit: Yes   PRESENTING CONCERNS: Patient and/or family reports the following symptoms/concerns: anxiety, family stressors, sadness, and recent family dynamic changes.  Duration of problem: recurrent since 2010; Severity of problem: mild  GOALS ADDRESSED: Patient will: 1.  Reduce symptoms of: anxiety, depression and stress  2.  Increase knowledge and/or ability of: coping skills, healthy habits and stress reduction  3.  Demonstrate ability to: Increase healthy adjustment to current life circumstances and Increase adequate support systems for patient/family  INTERVENTIONS: Interventions utilized:  Motivational Interviewing, Brief CBT and Supportive Counseling Standardized Assessments completed: Not Needed  ASSESSMENT: Patient currently experiencing mild to moderate levels of  anxiety.  Patient is continuing to adjust to her step-son moving into their home. Patient is continuing to communicate her concerns with her husband. Patient discussed moving forward with her upcoming surgery.  Patient discussed the importance of her not ignoring her own personal needs, and using this theory allowed her to move forward with her surgery.   Patient may benefit from counseling.  PLAN: 1. Follow up with behavioral health clinician on : two weeks.  [name], [facility], [place]

## 2019-05-07 ENCOUNTER — Ambulatory Visit (HOSPITAL_BASED_OUTPATIENT_CLINIC_OR_DEPARTMENT_OTHER): Payer: Medicaid Other | Admitting: Anesthesiology

## 2019-05-07 ENCOUNTER — Encounter (HOSPITAL_BASED_OUTPATIENT_CLINIC_OR_DEPARTMENT_OTHER)
Admission: RE | Disposition: A | Discharge status: Home / Self Care | Payer: Self-pay | Source: Home / Self Care | Attending: General Surgery

## 2019-05-07 ENCOUNTER — Other Ambulatory Visit: Payer: Self-pay

## 2019-05-07 ENCOUNTER — Encounter (HOSPITAL_BASED_OUTPATIENT_CLINIC_OR_DEPARTMENT_OTHER): Payer: Self-pay | Admitting: General Surgery

## 2019-05-07 ENCOUNTER — Ambulatory Visit (HOSPITAL_BASED_OUTPATIENT_CLINIC_OR_DEPARTMENT_OTHER)
Admission: RE | Admit: 2019-05-07 | Discharge: 2019-05-07 | Disposition: A | Discharge status: Home / Self Care | Payer: Medicaid Other | Attending: General Surgery | Admitting: General Surgery

## 2019-05-07 DIAGNOSIS — G4730 Sleep apnea, unspecified: Secondary | ICD-10-CM | POA: Diagnosis not present

## 2019-05-07 DIAGNOSIS — Z7984 Long term (current) use of oral hypoglycemic drugs: Secondary | ICD-10-CM | POA: Insufficient documentation

## 2019-05-07 DIAGNOSIS — Z6841 Body Mass Index (BMI) 40.0 and over, adult: Secondary | ICD-10-CM | POA: Insufficient documentation

## 2019-05-07 DIAGNOSIS — N182 Chronic kidney disease, stage 2 (mild): Secondary | ICD-10-CM | POA: Diagnosis not present

## 2019-05-07 DIAGNOSIS — K641 Second degree hemorrhoids: Secondary | ICD-10-CM | POA: Diagnosis not present

## 2019-05-07 DIAGNOSIS — I10 Essential (primary) hypertension: Secondary | ICD-10-CM | POA: Insufficient documentation

## 2019-05-07 DIAGNOSIS — E1122 Type 2 diabetes mellitus with diabetic chronic kidney disease: Secondary | ICD-10-CM | POA: Diagnosis not present

## 2019-05-07 DIAGNOSIS — Z79899 Other long term (current) drug therapy: Secondary | ICD-10-CM | POA: Insufficient documentation

## 2019-05-07 DIAGNOSIS — K642 Third degree hemorrhoids: Secondary | ICD-10-CM | POA: Diagnosis not present

## 2019-05-07 DIAGNOSIS — I129 Hypertensive chronic kidney disease with stage 1 through stage 4 chronic kidney disease, or unspecified chronic kidney disease: Secondary | ICD-10-CM | POA: Diagnosis not present

## 2019-05-07 HISTORY — DX: Diverticulosis of large intestine without perforation or abscess without bleeding: K57.30

## 2019-05-07 HISTORY — DX: Personal history of colon polyps, unspecified: Z86.0100

## 2019-05-07 HISTORY — DX: Hyperlipidemia, unspecified: E78.5

## 2019-05-07 HISTORY — DX: Anxiety disorder, unspecified: F41.9

## 2019-05-07 HISTORY — DX: Personal history of colonic polyps: Z86.010

## 2019-05-07 HISTORY — PX: TRANSANAL HEMORRHOIDAL DEARTERIALIZATION: SHX6136

## 2019-05-07 HISTORY — DX: Type 2 diabetes mellitus without complications: E11.9

## 2019-05-07 HISTORY — DX: Unspecified hemorrhoids: K64.9

## 2019-05-07 HISTORY — DX: Iron deficiency anemia, unspecified: D50.9

## 2019-05-07 HISTORY — DX: Complete loss of teeth, unspecified cause, unspecified class: K08.109

## 2019-05-07 HISTORY — DX: Complete loss of teeth, unspecified cause, unspecified class: Z97.2

## 2019-05-07 HISTORY — DX: Obstructive sleep apnea (adult) (pediatric): G47.33

## 2019-05-07 LAB — POCT I-STAT, CHEM 8
BUN: 10 mg/dL (ref 8–23)
Calcium, Ion: 1.27 mmol/L (ref 1.15–1.40)
Chloride: 106 mmol/L (ref 98–111)
Creatinine, Ser: 0.8 mg/dL (ref 0.44–1.00)
Glucose, Bld: 95 mg/dL (ref 70–99)
HCT: 39 % (ref 36.0–46.0)
Hemoglobin: 13.3 g/dL (ref 12.0–15.0)
Potassium: 3.4 mmol/L — ABNORMAL LOW (ref 3.5–5.1)
Sodium: 144 mmol/L (ref 135–145)
TCO2: 28 mmol/L (ref 22–32)

## 2019-05-07 SURGERY — TRANSANAL HEMORRHOIDAL DEARTERIALIZATION
Anesthesia: Monitor Anesthesia Care | Site: Rectum

## 2019-05-07 MED ORDER — SODIUM CHLORIDE 0.9% FLUSH
3.0000 mL | INTRAVENOUS | Status: DC | PRN
  Filled 2019-05-07: qty 3

## 2019-05-07 MED ORDER — MIDAZOLAM HCL 2 MG/2ML IJ SOLN
INTRAMUSCULAR | Status: AC
  Filled 2019-05-07: qty 2

## 2019-05-07 MED ORDER — BUPIVACAINE LIPOSOME 1.3 % IJ SUSP
20.0000 mL | Freq: Once | INTRAMUSCULAR | Status: DC
  Filled 2019-05-07: qty 20

## 2019-05-07 MED ORDER — OXYCODONE HCL 5 MG/5ML PO SOLN
5.0000 mg | Freq: Once | ORAL | Status: DC | PRN
  Filled 2019-05-07: qty 5

## 2019-05-07 MED ORDER — ACETAMINOPHEN 650 MG RE SUPP
650.0000 mg | RECTAL | Status: DC | PRN
  Filled 2019-05-07: qty 1

## 2019-05-07 MED ORDER — LIDOCAINE 5 % EX OINT
TOPICAL_OINTMENT | CUTANEOUS | Status: DC | PRN
  Administered 2019-05-07: 1

## 2019-05-07 MED ORDER — FENTANYL CITRATE (PF) 100 MCG/2ML IJ SOLN
INTRAMUSCULAR | Status: DC | PRN
  Administered 2019-05-07: 50 ug via INTRAVENOUS

## 2019-05-07 MED ORDER — MIDAZOLAM HCL 2 MG/2ML IJ SOLN
INTRAMUSCULAR | Status: DC | PRN
  Administered 2019-05-07: 2 mg via INTRAVENOUS

## 2019-05-07 MED ORDER — GABAPENTIN 300 MG PO CAPS
ORAL_CAPSULE | ORAL | Status: AC
  Filled 2019-05-07: qty 1

## 2019-05-07 MED ORDER — KETAMINE HCL 10 MG/ML IJ SOLN
INTRAMUSCULAR | Status: AC
  Filled 2019-05-07: qty 1

## 2019-05-07 MED ORDER — MEPERIDINE HCL 25 MG/ML IJ SOLN
6.2500 mg | INTRAMUSCULAR | Status: DC | PRN
  Filled 2019-05-07: qty 1

## 2019-05-07 MED ORDER — PROPOFOL 500 MG/50ML IV EMUL
INTRAVENOUS | Status: AC
  Filled 2019-05-07: qty 50

## 2019-05-07 MED ORDER — OXYCODONE HCL 5 MG PO TABS
5.0000 mg | ORAL_TABLET | ORAL | Status: DC | PRN
  Filled 2019-05-07: qty 2

## 2019-05-07 MED ORDER — ACETAMINOPHEN 160 MG/5ML PO SOLN
325.0000 mg | ORAL | Status: DC | PRN
  Filled 2019-05-07: qty 20.3

## 2019-05-07 MED ORDER — LIDOCAINE 2% (20 MG/ML) 5 ML SYRINGE
INTRAMUSCULAR | Status: DC | PRN
  Administered 2019-05-07: 50 mg via INTRAVENOUS

## 2019-05-07 MED ORDER — SODIUM CHLORIDE 0.9 % IV SOLN
INTRAVENOUS | Status: DC | PRN
  Administered 2019-05-07: 20 mg/kg/min via INTRAVENOUS

## 2019-05-07 MED ORDER — BUPIVACAINE LIPOSOME 1.3 % IJ SUSP
INTRAMUSCULAR | Status: DC | PRN
  Administered 2019-05-07: 20 mL

## 2019-05-07 MED ORDER — ACETAMINOPHEN 500 MG PO TABS
1000.0000 mg | ORAL_TABLET | ORAL | Status: AC
  Administered 2019-05-07: 1000 mg via ORAL
  Filled 2019-05-07: qty 2

## 2019-05-07 MED ORDER — ONDANSETRON HCL 4 MG/2ML IJ SOLN
4.0000 mg | Freq: Once | INTRAMUSCULAR | Status: DC | PRN
  Filled 2019-05-07: qty 2

## 2019-05-07 MED ORDER — ONDANSETRON HCL 4 MG/2ML IJ SOLN
INTRAMUSCULAR | Status: DC | PRN
  Administered 2019-05-07: 4 mg via INTRAVENOUS

## 2019-05-07 MED ORDER — OXYCODONE HCL 5 MG PO TABS
5.0000 mg | ORAL_TABLET | Freq: Once | ORAL | Status: DC | PRN
  Filled 2019-05-07: qty 1

## 2019-05-07 MED ORDER — BUPIVACAINE-EPINEPHRINE 0.5% -1:200000 IJ SOLN
INTRAMUSCULAR | Status: DC | PRN
  Administered 2019-05-07: 50 mL

## 2019-05-07 MED ORDER — GABAPENTIN 300 MG PO CAPS
300.0000 mg | ORAL_CAPSULE | ORAL | Status: AC
  Administered 2019-05-07: 300 mg via ORAL
  Filled 2019-05-07: qty 1

## 2019-05-07 MED ORDER — ACETAMINOPHEN 325 MG PO TABS
325.0000 mg | ORAL_TABLET | ORAL | Status: DC | PRN
  Filled 2019-05-07: qty 2

## 2019-05-07 MED ORDER — PROPOFOL 500 MG/50ML IV EMUL
INTRAVENOUS | Status: AC
  Filled 2019-05-07: qty 100

## 2019-05-07 MED ORDER — LACTATED RINGERS IV SOLN
INTRAVENOUS | Status: DC
  Filled 2019-05-07 (×2): qty 1000

## 2019-05-07 MED ORDER — ACETAMINOPHEN 500 MG PO TABS
ORAL_TABLET | ORAL | Status: AC
  Filled 2019-05-07: qty 2

## 2019-05-07 MED ORDER — PROPOFOL 500 MG/50ML IV EMUL
INTRAVENOUS | Status: DC | PRN
  Administered 2019-05-07: 200 ug/kg/min via INTRAVENOUS

## 2019-05-07 MED ORDER — ACETAMINOPHEN 325 MG PO TABS
650.0000 mg | ORAL_TABLET | ORAL | Status: DC | PRN
  Filled 2019-05-07: qty 2

## 2019-05-07 MED ORDER — SODIUM CHLORIDE 0.9 % IV SOLN
250.0000 mL | INTRAVENOUS | Status: DC | PRN
  Filled 2019-05-07: qty 250

## 2019-05-07 MED ORDER — SODIUM CHLORIDE 0.9% FLUSH
3.0000 mL | Freq: Two times a day (BID) | INTRAVENOUS | Status: DC
  Filled 2019-05-07: qty 3

## 2019-05-07 MED ORDER — FENTANYL CITRATE (PF) 100 MCG/2ML IJ SOLN
25.0000 ug | INTRAMUSCULAR | Status: DC | PRN
  Filled 2019-05-07: qty 1

## 2019-05-07 MED ORDER — OXYCODONE HCL 5 MG PO TABS: 5.0000 mg | ORAL_TABLET | Freq: Four times a day (QID) | ORAL | 0 refills | Status: DC | PRN

## 2019-05-07 SURGICAL SUPPLY — 42 items
BLADE HEX COATED 2.75 (ELECTRODE) IMPLANT
BRIEF STRETCH FOR OB PAD LRG (UNDERPADS AND DIAPERS) IMPLANT
COVER BACK TABLE 60X90IN (DRAPES) ×3 IMPLANT
COVER WAND RF STERILE (DRAPES) ×6 IMPLANT
DECANTER SPIKE VIAL GLASS SM (MISCELLANEOUS) IMPLANT
DRAPE HYSTEROSCOPY (DRAPE) ×3 IMPLANT
DRAPE SHEET LG 3/4 BI-LAMINATE (DRAPES) ×3 IMPLANT
DRSG PAD ABDOMINAL 8X10 ST (GAUZE/BANDAGES/DRESSINGS) ×3 IMPLANT
ELECT REM PT RETURN 9FT ADLT (ELECTROSURGICAL) ×3
ELECTRODE REM PT RTRN 9FT ADLT (ELECTROSURGICAL) ×1 IMPLANT
GAUZE SPONGE 4X4 12PLY STRL (GAUZE/BANDAGES/DRESSINGS) ×3 IMPLANT
GLOVE BIO SURGEON STRL SZ 6.5 (GLOVE) ×2 IMPLANT
GLOVE BIO SURGEONS STRL SZ 6.5 (GLOVE) ×1
GLOVE BIOGEL PI IND STRL 7.0 (GLOVE) ×2 IMPLANT
GLOVE BIOGEL PI IND STRL 7.5 (GLOVE) ×1 IMPLANT
GLOVE BIOGEL PI IND STRL 8.5 (GLOVE) ×1 IMPLANT
GLOVE BIOGEL PI INDICATOR 7.0 (GLOVE) ×4
GLOVE BIOGEL PI INDICATOR 7.5 (GLOVE) ×2
GLOVE BIOGEL PI INDICATOR 8.5 (GLOVE) ×2
GLOVE INDICATOR 8.5 STRL (GLOVE) ×3 IMPLANT
GOWN STRL REUS W/TWL 2XL LVL3 (GOWN DISPOSABLE) ×3 IMPLANT
GOWN STRL REUS W/TWL XL LVL3 (GOWN DISPOSABLE) ×3 IMPLANT
HEMOSTAT SURGICEL 4X8 (HEMOSTASIS) IMPLANT
KIT SIGMOIDOSCOPE (SET/KITS/TRAYS/PACK) IMPLANT
KIT SLIDE ONE PROLAPS HEMORR (KITS) ×3 IMPLANT
KIT TURNOVER CYSTO (KITS) ×3 IMPLANT
LEGGING LITHOTOMY PAIR STRL (DRAPES) ×3 IMPLANT
LUBRICANT JELLY K Y 4OZ (MISCELLANEOUS) ×3 IMPLANT
NEEDLE HYPO 22GX1.5 SAFETY (NEEDLE) ×3 IMPLANT
PACK BASIN DAY SURGERY FS (CUSTOM PROCEDURE TRAY) ×3 IMPLANT
PAD ARMBOARD 7.5X6 YLW CONV (MISCELLANEOUS) IMPLANT
PENCIL BUTTON HOLSTER BLD 10FT (ELECTRODE) IMPLANT
SPONGE HEMORRHOID 8X3CM (HEMOSTASIS) IMPLANT
SUT CHROMIC 2 0 SH (SUTURE) IMPLANT
SUT CHROMIC 3 0 SH 27 (SUTURE) IMPLANT
SUT VIC AB 2-0 UR6 27 (SUTURE) IMPLANT
SYR CONTROL 10ML LL (SYRINGE) ×3 IMPLANT
TOWEL OR 17X26 10 PK STRL BLUE (TOWEL DISPOSABLE) ×3 IMPLANT
TRAY DSU PREP LF (CUSTOM PROCEDURE TRAY) ×3 IMPLANT
TUBE CONNECTING 12'X1/4 (SUCTIONS) ×1
TUBE CONNECTING 12X1/4 (SUCTIONS) ×2 IMPLANT
YANKAUER SUCT BULB TIP NO VENT (SUCTIONS) ×3 IMPLANT

## 2019-05-07 NOTE — Anesthesia Preprocedure Evaluation (Addendum)
Anesthesia Evaluation  Patient identified by MRN, date of birth, ID band Patient awake    Reviewed: Allergy & Precautions, H&P , NPO status , Patient's Chart, lab work & pertinent test results  Airway Mallampati: II  TM Distance: >3 FB Neck ROM: Full    Dental no notable dental hx. (+) Edentulous Upper, Edentulous Lower, Dental Advisory Given   Pulmonary neg pulmonary ROS, sleep apnea and Continuous Positive Airway Pressure Ventilation ,    Pulmonary exam normal breath sounds clear to auscultation       Cardiovascular hypertension, Pt. on medications negative cardio ROS   Rhythm:Regular Rate:Normal     Neuro/Psych Anxiety negative neurological ROS  negative psych ROS   GI/Hepatic negative GI ROS, Neg liver ROS,   Endo/Other  negative endocrine ROSdiabetes, Type 2, Oral Hypoglycemic AgentsMorbid obesity  Renal/GU Renal diseasenegative Renal ROS  negative genitourinary   Musculoskeletal negative musculoskeletal ROS (+) Arthritis , Osteoarthritis,    Abdominal (+) + obese,   Peds negative pediatric ROS (+)  Hematology negative hematology ROS (+) Blood dyscrasia, anemia ,   Anesthesia Other Findings   Reproductive/Obstetrics negative OB ROS                            Anesthesia Physical  Anesthesia Plan  ASA: III  Anesthesia Plan: MAC   Post-op Pain Management:    Induction: Intravenous  PONV Risk Score and Plan: 2 and Ondansetron  Airway Management Planned: Nasal Cannula, Simple Face Mask and Mask  Additional Equipment:   Intra-op Plan:   Post-operative Plan:   Informed Consent: I have reviewed the patients History and Physical, chart, labs and discussed the procedure including the risks, benefits and alternatives for the proposed anesthesia with the patient or authorized representative who has indicated his/her understanding and acceptance.     Dental advisory  given  Plan Discussed with: CRNA, Anesthesiologist and Surgeon  Anesthesia Plan Comments:         Anesthesia Quick Evaluation

## 2019-05-07 NOTE — Anesthesia Procedure Notes (Signed)
Procedure Name: LMA Insertion Date/Time: 05/07/2019 8:59 AM Performed by: Wanita Chamberlain, CRNA Pre-anesthesia Checklist: Patient identified, Emergency Drugs available, Suction available and Patient being monitored Patient Re-evaluated:Patient Re-evaluated prior to induction Oxygen Delivery Method: Circle system utilized Preoxygenation: Pre-oxygenation with 100% oxygen Induction Type: IV induction Placement Confirmation: CO2 detector and positive ETCO2

## 2019-05-07 NOTE — Transfer of Care (Signed)
Immediate Anesthesia Transfer of Care Note  Patient: Kristen Frost  Procedure(s) Performed: TRANSANAL HEMORRHOIDAL DEARTERIALIZATION (N/A Rectum)  Patient Location: PACU  Anesthesia Type:General  Level of Consciousness: awake, alert , oriented and patient cooperative  Airway & Oxygen Therapy: Patient Spontanous Breathing and Patient connected to nasal cannula oxygen  Post-op Assessment: Report given to RN and Post -op Vital signs reviewed and stable  Post vital signs: Reviewed and stable  Last Vitals:  Vitals Value Taken Time  BP 146/65 05/07/19 0951  Temp 36.8 C 05/07/19 0951  Pulse 111 05/07/19 0954  Resp 23 05/07/19 0954  SpO2 96 % 05/07/19 0954  Vitals shown include unvalidated device data.  Last Pain:  Vitals:   05/07/19 0822  TempSrc: Oral         Complications: No apparent anesthesia complications

## 2019-05-07 NOTE — Anesthesia Procedure Notes (Signed)
Procedure Name: LMA Insertion Date/Time: 05/07/2019 8:59 AM Performed by: Wanita Chamberlain, CRNA Patient Re-evaluated:Patient Re-evaluated prior to induction Oxygen Delivery Method: Circle system utilized Preoxygenation: Pre-oxygenation with 100% oxygen Induction Type: IV induction LMA: LMA inserted LMA Size: 4.0 Number of attempts: 1 Placement Confirmation: breath sounds checked- equal and bilateral,  CO2 detector and positive ETCO2 Tube secured with: Tape Dental Injury: Teeth and Oropharynx as per pre-operative assessment

## 2019-05-07 NOTE — Discharge Instructions (Addendum)
Information for Discharge Teaching: EXPAREL (bupivacaine liposome injectable suspension)   Your surgeon gave you EXPAREL(bupivacaine) in your surgical incision to help control your pain after surgery.   EXPAREL is a local anesthetic that provides pain relief by numbing the tissue around the surgical site.  EXPAREL is designed to release pain medication over time and can control pain for up to 72 hours.  Depending on how you respond to EXPAREL, you may require less pain medication during your recovery.  Possible side effects:  Temporary loss of sensation or ability to move in the area where bupivacaine was injected.  Nausea, vomiting, constipation  Rarely, numbness and tingling in your mouth or lips, lightheadedness, or anxiety may occur.  Call your doctor right away if you think you may be experiencing any of these sensations, or if you have other questions regarding possible side effects.  Follow all other discharge instructions given to you by your surgeon or nurse. Eat a healthy diet and drink plenty of water or other fluids.  If you return to the hospital for any reason within 96 hours following the administration of EXPAREL, please inform your health care providers.ANORECTAL SURGERY: POST OP INSTRUCTIONS 1. Take your usually prescribed home medications unless otherwise directed. 2. DIET: During the first few hours after surgery sip on some liquids until you are able to urinate.  It is normal to not urinate for several hours after this surgery.  If you feel uncomfortable, please contact the office for instructions.  After you are able to urinate,you may eat, if you feel like it.  Follow a light bland diet the first 24 hours after arrival home, such as soup, liquids, crackers, etc.  Be sure to include lots of fluids daily (6-8 glasses).  Avoid fast food or heavy meals, as your are more likely to get nauseated.  Eat a low fat diet the next few days after surgery.  Limit caffeine intake to  1-2 servings a day. 3. PAIN CONTROL: a. Pain is best controlled by a usual combination of several different methods TOGETHER: i. Muscle relaxation: Soak in a warm bath (or Sitz bath) three times a day and after bowel movements.  Continue to do this until all pain is resolved. ii. Over the counter pain medication iii. Prescription pain medication b. Most patients will experience some swelling and discomfort in the anus/rectal area and incisions.  Heat such as warm towels, sitz baths, warm baths, etc to help relax tight/sore spots and speed recovery.  Some people prefer to use ice, especially in the first couple days after surgery, as it may decrease the pain and swelling, or alternate between ice & heat.  Experiment to what works for you.  Swelling and bruising can take several weeks to resolve.  Pain can take even longer to completely resolve. c. It is helpful to take an over-the-counter pain medication regularly for the first few weeks.  Choose one of the following that works best for you: i. Naproxen (Aleve, etc)  Two 220mg  tabs twice a day ii. Ibuprofen (Advil, etc) Three 200mg  tabs four times a day (every meal & bedtime) d. A  prescription for pain medication (such as percocet, oxycodone, hydrocodone, etc) should be given to you upon discharge.  Take your pain medication as prescribed.  i. If you are having problems/concerns with the prescription medicine (does not control pain, nausea, vomiting, rash, itching, etc), please call us [phone] to see if we need to switch you to a different pain medicine that  will work better for you and/or control your side effect better. ii. If you need a refill on your pain medication, please contact your pharmacy.  They will contact our office to request authorization. Prescriptions will not be filled after 5 pm or on week-ends. 4. KEEP YOUR BOWELS REGULAR and AVOID CONSTIPATION a. The goal is one to two soft bowel movements a day.  You should at least have a  bowel movement every other day. b. Avoid getting constipated.  Between the surgery and the pain medications, it is common to experience some constipation. This can be very painful after rectal surgery.  Increasing fluid intake and taking a fiber supplement (such as Metamucil, Citrucel, FiberCon, etc) 1-2 times a day regularly will usually help prevent this problem from occurring.  A stool softener like colace is also recommended.  This can be purchased over the counter at your pharmacy.  You can take it up to 3 times a day.  If you do not have a bowel movement after 24 hrs since your surgery, take one does of milk of magnesia.  If you still haven't had a bowel movement 8-12 hours after that dose, take another dose.  If you don't have a bowel movement 48 hrs after surgery, purchase a Fleets enema from the drug store and administer gently per package instructions.  If you still are having trouble with your bowel movements after that, please call the office for further instructions. c. If you develop diarrhea or have many loose bowel movements, simplify your diet to bland foods & liquids for a few days.  Stop any stool softeners and decrease your fiber supplement.  Switching to mild anti-diarrheal medications (Kayopectate, Pepto Bismol) can help.  If this worsens or does not improve, please call us.  5. Wound Care a. Remove your bandages before your first bowel movement or 8 hours after surgery.     b. Remove any wound packing material at this tim,e as well.  You do not need to repack the wound unless instructed otherwise.  Wear an absorbent pad or soft cotton gauze in your underwear to catch any drainage and help keep the area clean. You should change this every 2-3 hours while awake. c. Keep the area clean and dry.  Bathe / shower every day, especially after bowel movements.  Keep the area clean by showering / bathing over the incision / wound.   It is okay to soak an open wound to help wash it.  Wet wipes or  showers / gentle washing after bowel movements is often less traumatic than regular toilet paper. d. Dennis Bast may have some styrofoam-like soft packing in the rectum which will come out with the first bowel movement.  e. You will often notice bleeding with bowel movements.  This should slow down by the end of the first week of surgery f. Expect some drainage.  This should slow down, too, by the end of the first week of surgery.  Wear an absorbent pad or soft cotton gauze in your underwear until the drainage stops. g. Do Not sit on a rubber or pillow ring.  This can make you symptoms worse.  You may sit on a soft pillow if needed.  6. ACTIVITIES as tolerated:   a. You may resume regular (light) daily activities beginning the next day--such as daily self-care, walking, climbing stairs--gradually increasing activities as tolerated.  If you can walk 30 minutes without difficulty, it is safe to try more intense activity such as jogging,  treadmill, bicycling, low-impact aerobics, swimming, etc. b. Save the most intensive and strenuous activity for last such as sit-ups, heavy lifting, contact sports, etc  Refrain from any heavy lifting or straining until you are off narcotics for pain control.   c. You may drive when you are no longer taking prescription pain medication, you can comfortably sit for long periods of time, and you can safely maneuver your car and apply brakes. d. Dennis Bast may have sexual intercourse when it is comfortable.  7. FOLLOW UP in our office a. Please call [facility] at [phone] to set up an appointment to see your surgeon in the office for a follow-up appointment approximately 3-4 weeks after your surgery. b. Make sure that you call for this appointment the day you arrive home to insure a convenient appointment time. 10. IF YOU HAVE DISABILITY OR FAMILY LEAVE FORMS, BRING THEM TO THE OFFICE FOR PROCESSING.  DO NOT GIVE THEM TO YOUR DOCTOR.     WHEN TO CALL us [phone]: 1. Poor pain  control 2. Reactions / problems with new medications (rash/itching, nausea, etc)  3. Fever over 101.5 F (38.5 C) 4. Inability to urinate 5. Nausea and/or vomiting 6. Worsening swelling or bruising 7. Continued bleeding from incision. 8. Increased pain, redness, or drainage from the incision  The clinic staff is available to answer your questions during regular business hours (8:30am-5pm).  Please don't hesitate to call and ask to speak to one of our nurses for clinical concerns.   A surgeon from [facility] is always on call at the hospitals   If you have a medical emergency, go to the nearest emergency room or call 911.    [facility], [place], [place], [place], [place]  [address] ? MAIN: [phone] ? TOLL FREE: [phone] ? FAX [phone] [email or web address]

## 2019-05-07 NOTE — H&P (Signed)
     History of Present Illness  The patient is a 64 year old female who presents with a complaint of Rectal bleeding. 64 year old female who presented to the office for evaluation of rectal bleeding. She was seen by her gastroenterologist for this recently. Colonoscopy was performed which showed grade 3 hemorrhoids. She has had this rectal bleeding for years. 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 She reported regular bowel movements with minimal straining. She did report frequent diarrhea and multiple bowel movements a day. She has never had any hemorrhoid procedures in the past. She had not started her fiber supplement yet. Anoscopy in the office showed grade 2 hemorrhoids. Given her multiple bowel movements a day. I recommended that she start a water soluble fiber supplement. She has been doing this for the past 3 months. I placed a band on the RA several months ago, which helped some. Another band was placed in the RP in Oct 2020. Since that time she has continued to have occasional episodes of rectal bleeding and is here today for further evaluation.   Medication History  Victoza (18MG /3ML Soln Pen-inj, Subcutaneous) Active. AmLODIPine Besylate (5MG  Tablet, Oral) Active. Atorvastatin Calcium (40MG  Tablet, Oral) Active. Ferrous Sulfate (325 (65 Fe)MG Tablet, Oral) Active. Vitamin D (2000UNIT Capsule, Oral) Active. Fluticasone Propionate (50MCG/ACT Suspension, Nasal) Active. Losartan Potassium (25MG  Tablet, Oral) Active. MetFORMIN HCl (1000MG  Tablet, Oral) Active. Medications Reconciled  BP (!) 153/81   Pulse 86   Temp (!) 97.4 F (36.3 C) (Oral)   Resp 18   Ht 5' 1.5" (1.562 m)   Wt 113.4 kg   SpO2 99%   BMI 46.47 kg/m    Physical Exam  General Mental Status-Alert. General Appearance-Cooperative. CV: RRR Lungs:CTA Abdomen Palpation/Percussion Palpation and Percussion of the abdomen reveal - Soft.  Rectal Anorectal Exam External - skin tag.  Internal - normal sphincter tone.  Other: Procedure: Anoscopy....Marland KitchenMarland KitchenSurgeon: Marcello Moores....Marland KitchenMarland KitchenAfter the risks and benefits were explained, verbal consent was obtained for above procedure. A medical assistant chaperone was present throughout the entire procedure. ....Marland KitchenMarland KitchenAnesthesia: none....Marland KitchenMarland KitchenDiagnosis: rectal bleeding ....Marland KitchenMarland KitchenFindings: grade 2 RP and LL internal hemorrhoids, not amenable to RBL  Performed: 02/24/2019 10:26 AM    Assessment & Plan  PROLAPSED INTERNAL HEMORRHOIDS, GRADE 2 (K64.1) Impression: 64 year old female with persistent rectal bleeding. We have try rubber band ligation in the office on several occasions. She continues to have rectal bleeding. On exam, she appears to have inflammation of a right posterior hemorrhoid, but it is not large enough to place a rubber band. We discussed steroid suppositories when necessary versus trans-hemorrhoidal dearterialization as possible options moving forward. She has decided to go with the operative procedure.  We have discussed the risk of surgery, which mainly include pain, bleeding and recurrence.

## 2019-05-07 NOTE — Op Note (Signed)
05/07/2019  9:46 AM  PATIENT:  Kristen Frost  64 y.o. female  Patient Care Team: Ina Homes, MD as PCP - General  PRE-OPERATIVE DIAGNOSIS:  grade 2 bleeding hemorrhoids  POST-OPERATIVE DIAGNOSIS:  grade 2 bleeding hemorrhoids  PROCEDURE: TRANSANAL HEMORRHOIDAL DEARTERIALIZATION    Surgeon(s): Leighton Ruff, MD  ASSISTANT: none   ANESTHESIA:   local and MAC  EBL:  20 ml  DRAINS: none   SPECIMEN:  No Specimen  DISPOSITION OF SPECIMEN:  N/A  COUNTS:  YES  PLAN OF CARE: Discharge to home after PACU  PATIENT DISPOSITION:  PACU - hemodynamically stable.  INDICATION: 64 y.o. F with bleeding grade 2 internal hemorrhoids who has failed rubber band ligation attempts   OR FINDINGS: grade 2 R post and L lateral, grade 3 R ant hemorrhoids  Description: Informed consent was confirmed. Patient underwent general anesthesia without difficulty. Patient was placed into lithotomy positioning.  The perianal region was prepped and draped in sterile fashion. Surgical time out confirmed or plan.  I did digital rectal examination and then transitioned over to anoscopy to get a sense of the anatomy.  I switched over to the [facility] fiberoptically lit Doppler anocope.   Using the Doppler on the tip of the [place] anoscope, I identified the arterial hemorrhoidal vessels coming in in the classic hexagonal anatomical pattern (right posterior/lateral/anterior, left posterior /lateral/anterior).    I proceeded to ligate the hemorrhoidal arteries. I used a 2-0 Vicryl suture on a UR-6 needle in a figure-of-eight fashion over the signal around 6 cm proximal to the anal verge. I then ran that stitch longitudinally more distally to the dentate line. I then tied that stitch down to cause a hemorrhoidopexy. I did that for all 6 locations.    I redid Doppler anoscopy. I Identified a signal at the R lateral and L lateral locations.  I isolated and ligated this with a figure-of-eight stitch. Signals went  away at both areas.  At completion of this, all hemorrhoids were reduced into the rectum.  There is no more prolapse. External anatomy looked normal.  I repeated anoscopy and examination.   Hemostasis was good.  Patient is being extubated go to recovery room.  I am about to discuss the patient's status to the family.

## 2019-05-10 NOTE — Anesthesia Postprocedure Evaluation (Signed)
Anesthesia Post Note  Patient: Mikah Rottinghaus  Procedure(s) Performed: TRANSANAL HEMORRHOIDAL DEARTERIALIZATION (N/A Rectum)     Patient location during evaluation: PACU Anesthesia Type: MAC Level of consciousness: awake and alert Pain management: pain level controlled Vital Signs Assessment: post-procedure vital signs reviewed and stable Respiratory status: spontaneous breathing, nonlabored ventilation, respiratory function stable and patient connected to nasal cannula oxygen Cardiovascular status: stable and blood pressure returned to baseline Postop Assessment: no apparent nausea or vomiting Anesthetic complications: no    Last Vitals:  Vitals:   05/07/19 1030 05/07/19 1052  BP:  (!) 150/81  Pulse:  91  Resp:  20  Temp: 36.4 C   SpO2:  97%    Last Pain:  Vitals:   05/07/19 1052  TempSrc:   PainSc: 0-No pain   Pain Goal:                   Shanautica Forker

## 2019-05-12 ENCOUNTER — Ambulatory Visit (INDEPENDENT_AMBULATORY_CARE_PROVIDER_SITE_OTHER): Payer: Medicaid Other | Admitting: Licensed Clinical Social Worker

## 2019-05-12 ENCOUNTER — Encounter: Payer: Self-pay | Admitting: Licensed Clinical Social Worker

## 2019-05-12 ENCOUNTER — Other Ambulatory Visit: Payer: Self-pay

## 2019-05-12 DIAGNOSIS — F419 Anxiety disorder, unspecified: Secondary | ICD-10-CM

## 2019-05-12 NOTE — BH Specialist Note (Signed)
Integrated Behavioral Health Visit via Telemedicine (Telephone)  05/12/2019 Kristen Frost [id number]   Session Start time: 11:00  Session End time: 11:30 Total time: 30 minutes  Referring Provider: Dr. Tarri Abernethy Type of Visit: Telephonic Patient location: Home [facility] Provider location: Patient All persons participating in visit: Patient and [facility]  Confirmed patient's address: Yes  Confirmed patient's phone number: Yes  Any changes to demographics: No   Discussed confidentiality: Yes    The following statements were read to the patient and/or legal guardian that are established with the [facility] Provider.  "The purpose of this phone visit is to provide behavioral health care while limiting exposure to the coronavirus (COVID19).  There is a possibility of technology failure and discussed alternative modes of communication if that failure occurs."  "By engaging in this telephone visit, you consent to the provision of healthcare.  Additionally, you authorize for your insurance to be billed for the services provided during this telephone visit."   Patient and/or legal guardian consented to telephone visit: Yes   PRESENTING CONCERNS: Patient and/or family reports the following symptoms/concerns: anxiety, family stressors, sadness, and recent family dynamic changes.  Duration of problem: recurrent since 2010; Severity of problem: mild  GOALS ADDRESSED: Patient will: 1.  Reduce symptoms of: anxiety, depression and stress  2.  Increase knowledge and/or ability of: coping skills, healthy habits and stress reduction  3.  Demonstrate ability to: Increase healthy adjustment to current life circumstances and Increase adequate support systems for patient/family  INTERVENTIONS: Interventions utilized:  Brief CBT and Supportive Counseling Standardized Assessments completed: Not Needed  ASSESSMENT: Patient currently experiencing mild levels of anxiety.  Patient just had surgery,  and she is healing currently.  Patient processed her ongoing concerns about her step-son's mother, and what the future will hold in regards to custody. Patient shared that she cares deeply for her step-son, and she is trying her best to ensure that he has a healthy living environment. Patient is communicating with her spouse regularly to create plans and reduce her anxiety.   Patient may benefit from counseling.  PLAN: 1. Follow up with behavioral health clinician on : one week.   [name], [facility], [place]

## 2019-05-19 ENCOUNTER — Encounter: Payer: Self-pay | Admitting: Licensed Clinical Social Worker

## 2019-05-19 ENCOUNTER — Ambulatory Visit (INDEPENDENT_AMBULATORY_CARE_PROVIDER_SITE_OTHER): Payer: Medicaid Other | Admitting: Licensed Clinical Social Worker

## 2019-05-19 ENCOUNTER — Other Ambulatory Visit: Payer: Self-pay

## 2019-05-19 DIAGNOSIS — F419 Anxiety disorder, unspecified: Secondary | ICD-10-CM

## 2019-05-19 NOTE — BH Specialist Note (Signed)
Integrated Behavioral Health Visit via Telemedicine (Telephone)  05/19/2019 Haskell Riling [id number]   Session Start time: 11:00  Session End time: 11:30 Total time: 30 minutes  Referring Provider: Dr. Tarri Abernethy Type of Visit: Telephonic Patient location: Home [facility] Provider location: Office All persons participating in visit: Patient and [facility]  Confirmed patient's address: Yes  Confirmed patient's phone number: Yes  Any changes to demographics: No  Discussed confidentiality: Yes    The following statements were read to the patient and/or legal guardian that are established with the [facility] Provider.  "The purpose of this phone visit is to provide behavioral health care while limiting exposure to the coronavirus (COVID19).  There is a possibility of technology failure and discussed alternative modes of communication if that failure occurs."  "By engaging in this telephone visit, you consent to the provision of healthcare.  Additionally, you authorize for your insurance to be billed for the services provided during this telephone visit."   Patient and/or legal guardian consented to telephone visit: Yes   PRESENTING CONCERNS: Patient and/or family reports the following symptoms/concerns: anxiety, family stressors, sadness, and recent family dynamic changes. Duration of problem: recurrent since 2010; Severity of problem: mild  GOALS ADDRESSED: Patient will: 1.  Reduce symptoms of: anxiety, depression and stress  2.  Increase knowledge and/or ability of: coping skills, healthy habits and stress reduction  3.  Demonstrate ability to: Increase healthy adjustment to current life circumstances and Increase adequate support systems for patient/family  INTERVENTIONS: Interventions utilized:  Brief CBT and Supportive Counseling Standardized Assessments completed: Not Needed  ASSESSMENT: Patient currently experiencing mild levels of anxiety. Patient reported she is healing  from surgery, and she is continuing to try to focus on herself. Patient has adjusted well to her step-son being in the home, and processed ongoing concerns about custody. Patient has improved self-care, and communication.    Patient may benefit from counseling.  PLAN: 1. Follow up with behavioral health clinician on : three weeks.    [name], [facility], [place]

## 2019-05-21 ENCOUNTER — Encounter: Payer: Self-pay | Admitting: Internal Medicine

## 2019-05-21 ENCOUNTER — Other Ambulatory Visit: Payer: Self-pay

## 2019-05-21 ENCOUNTER — Ambulatory Visit: Payer: Medicaid Other | Admitting: Internal Medicine

## 2019-05-21 DIAGNOSIS — Z7984 Long term (current) use of oral hypoglycemic drugs: Secondary | ICD-10-CM | POA: Diagnosis not present

## 2019-05-21 DIAGNOSIS — E119 Type 2 diabetes mellitus without complications: Secondary | ICD-10-CM

## 2019-05-21 DIAGNOSIS — I129 Hypertensive chronic kidney disease with stage 1 through stage 4 chronic kidney disease, or unspecified chronic kidney disease: Secondary | ICD-10-CM

## 2019-05-21 DIAGNOSIS — Z6841 Body Mass Index (BMI) 40.0 and over, adult: Secondary | ICD-10-CM | POA: Diagnosis not present

## 2019-05-21 DIAGNOSIS — I10 Essential (primary) hypertension: Secondary | ICD-10-CM | POA: Diagnosis not present

## 2019-05-21 DIAGNOSIS — N189 Chronic kidney disease, unspecified: Secondary | ICD-10-CM

## 2019-05-21 DIAGNOSIS — Z79899 Other long term (current) drug therapy: Secondary | ICD-10-CM | POA: Diagnosis not present

## 2019-05-21 DIAGNOSIS — E1122 Type 2 diabetes mellitus with diabetic chronic kidney disease: Secondary | ICD-10-CM | POA: Diagnosis not present

## 2019-05-21 LAB — POCT GLYCOSYLATED HEMOGLOBIN (HGB A1C): Hemoglobin A1C: 5.9 % — AB (ref 4.0–5.6)

## 2019-05-21 LAB — GLUCOSE, CAPILLARY: Glucose-Capillary: 103 mg/dL — ABNORMAL HIGH (ref 70–99)

## 2019-05-21 MED ORDER — LIRAGLUTIDE 18 MG/3ML ~~LOC~~ SOPN: 1.8000 mg | PEN_INJECTOR | Freq: Every day | SUBCUTANEOUS | 4 refills | Status: DC

## 2019-05-21 MED ORDER — PEN NEEDLES 31G X 5 MM MISC: 1.0000 "application " | Freq: Every day | 1 refills | Status: DC

## 2019-05-21 NOTE — Assessment & Plan Note (Signed)
Patient's blood pressure is again above goal today. She is currently on amlodipine 10 mg once daily. She is takes this as prescribed. She has no issues affording it. She denies apparent side effects.  A/P: - Discussed adding another medication. She declined and would like to focus on weight loss  - Continue Amlodipine 10 mg QD

## 2019-05-21 NOTE — Progress Notes (Signed)
Internal Medicine Clinic Attending  Case discussed with Dr. Bloomfield at the time of the visit.  We reviewed the resident's history and exam and pertinent patient test results.  I agree with the assessment, diagnosis, and plan of care documented in the resident's note.  

## 2019-05-21 NOTE — Assessment & Plan Note (Signed)
Patient with well-controlled type II diabetes. She is currently on metformin 1000 mg once daily and victoza 1.8 mg QD. She is tolerating both medications well without any apparent side effects. Does not check her blood sugar daily.  A/P: - Well controlled  - Continue metformin 1000 mg once daily and victoza 1.8 mg QD

## 2019-05-21 NOTE — Assessment & Plan Note (Signed)
Patient with morbid obesity and related metabolic conditions. She was recently started on Victoza 1.8 mg QD to help facilitate weight loss. Her weight has remained stable at 250 pounds. She has had a lot of stress in her life. She is not increased her activity level. She has not modified her diet.  She does consume a high amount of liquid calories. She states that she wants to join [employer]. She is going to try to start exercising more.  A/P: - Discussed lifestyle modifications including increasing activity level and avoiding liquid calories. - Provided [facility] contact information  - Discuss bariatric surgery and types

## 2019-05-21 NOTE — Progress Notes (Signed)
   CC: HTN, DM, CKD, Obesity  HPI:  [name] is a 64 y.o. female with PMHx listed below presenting for HTN, DM, CKD. Please see the A&P for the status of the patient's chronic medical problems.  Past Medical History:  Diagnosis Date  . Anxiety   . Arthritis   . Bleeding hemorrhoids    grade 2  . Diverticulosis of colon   . Full dentures   . History of colon polyps   . Hyperlipidemia   . Hypertension    followed by pcp   (04-30-2019  per pt had stress test approx. 2005, was told normal (not available in epic)  . IDA (iron deficiency anemia)   . OSA on CPAP    followed by dr Halford Chessman ([place] [facility])    (04-30-2019  pt  stated uses every night)  . Restless leg syndrome   . Type 2 diabetes mellitus ([place])    followed by pcp   (04-30-2019  pt stated does not check blood sugar at home)   Review of Systems: Performed and all others negative.  Physical Exam: Vitals:   05/21/19 1416  BP: (!) 145/82  Pulse: 95  Temp: 99 F (37.2 C)  TempSrc: Oral  SpO2: 100%  Weight: 251 lb 4.8 oz (114 kg)  Height: 5' 1.5" (1.562 m)   General: Obese female in no acute distress Pulm: Good air movement with no wheezing or crackles  CV: RRR, no murmurs, no rubs   Assessment & Plan:   See Encounters Tab for problem based charting.  Patient discussed with Dr. Evette Doffing

## 2019-05-21 NOTE — Patient Instructions (Addendum)
Thank you for allowing me to provide your care over the last three years. I have sent out refills for your Victoza. Continue all your medications as prescribed.  Work to increase your physical activity by at least 30 minutes a day. Continue to focus on cutting out those liquid calories. Below is the contact information for the weight loss clinic established with [facility].   [facility] [phone] [place], [place] [address]  Please come back in three months to meet your new doctor.

## 2019-05-22 LAB — BMP8+ANION GAP
Anion Gap: 13 mmol/L (ref 10.0–18.0)
BUN/Creatinine Ratio: 7 — ABNORMAL LOW (ref 12–28)
BUN: 8 mg/dL (ref 8–27)
CO2: 23 mmol/L (ref 20–29)
Calcium: 9.5 mg/dL (ref 8.7–10.3)
Chloride: 108 mmol/L — ABNORMAL HIGH (ref 96–106)
Creatinine, Ser: 1.13 mg/dL — ABNORMAL HIGH (ref 0.57–1.00)
GFR calc Af Amer: 60 mL/min/{1.73_m2} (ref >59)
GFR calc non Af Amer: 52 mL/min/{1.73_m2} — ABNORMAL LOW (ref >59)
Glucose: 109 mg/dL — ABNORMAL HIGH (ref 65–99)
Potassium: 3.8 mmol/L (ref 3.5–5.2)
Sodium: 144 mmol/L (ref 134–144)

## 2019-06-09 ENCOUNTER — Encounter: Payer: Self-pay | Admitting: Licensed Clinical Social Worker

## 2019-06-09 ENCOUNTER — Ambulatory Visit (INDEPENDENT_AMBULATORY_CARE_PROVIDER_SITE_OTHER): Payer: Medicaid Other | Admitting: Licensed Clinical Social Worker

## 2019-06-09 ENCOUNTER — Other Ambulatory Visit: Payer: Self-pay

## 2019-06-09 DIAGNOSIS — F419 Anxiety disorder, unspecified: Secondary | ICD-10-CM

## 2019-06-09 NOTE — BH Specialist Note (Signed)
Integrated Behavioral Health Visit via Telemedicine (Telephone)  06/09/2019 Kristen Frost [id number]   Session Start time: 12:35  Session End time: 1;05 Total time: 30 minutes  Referring Provider: Dr. Tarri Abernethy Type of Visit: Telephonic Patient location: Home [facility] Provider location: Office All persons participating in visit: Patient and [facility]  Confirmed patient's address: Yes  Confirmed patient's phone number: No  Any changes to demographics: Yes   Discussed confidentiality: Yes    The following statements were read to the patient and/or legal guardian that are established with the [facility] Provider.  "The purpose of this phone visit is to provide behavioral health care while limiting exposure to the coronavirus (COVID19).  There is a possibility of technology failure and discussed alternative modes of communication if that failure occurs."  "By engaging in this telephone visit, you consent to the provision of healthcare.  Additionally, you authorize for your insurance to be billed for the services provided during this telephone visit."   Patient and/or legal guardian consented to telephone visit: Yes   PRESENTING CONCERNS: Patient and/or family reports the following symptoms/concerns: anxiety, family stressors, sadness, and recent family dynamic changes. Duration of problem: recurrent since 2010; Severity of problem: mild  GOALS ADDRESSED: Patient will: 1.  Reduce symptoms of: agitation, depression and stress  2.  Increase knowledge and/or ability of: coping skills and healthy habits  3.  Demonstrate ability to: Increase healthy adjustment to current life circumstances and Increase adequate support systems for patient/family  INTERVENTIONS: Interventions utilized:  Motivational Interviewing, Mindfulness or Relaxation Training and Supportive Counseling Standardized Assessments completed: Not Needed  ASSESSMENT: Patient currently experiencing mild levels of  anxiety. Patient is still healing from surgery, and trying to focus on her needs. Patient processed feelings/thoughts/stressors related to custody issues with her step-son. Patient is happy to have her step-son in her care, and she is hopeful that he can remain with them.   Patient is hoping to move again, and she feels that she has been in her current home too long.  Patient is planning to have conversations with her spouse in order to make progress towards her goals.  Patient may benefit from counseling.  PLAN: 1. Follow up with behavioral health clinician on : one week.   [name], [facility], [place]

## 2019-06-15 ENCOUNTER — Telehealth: Payer: Self-pay | Admitting: Internal Medicine

## 2019-06-15 NOTE — Telephone Encounter (Signed)
She is okay to continue taking it. Only recall on certain batches

## 2019-06-15 NOTE — Telephone Encounter (Signed)
Pt called / informed ok to to continue taking her Metformin per Dr Tarri Abernethy. Stated ok and thank you.

## 2019-06-15 NOTE — Telephone Encounter (Signed)
Pls contact pt regarding medicine [phone]

## 2019-06-15 NOTE — Telephone Encounter (Signed)
Called pt - stated she had read about recall on Metformin; wants to know if she should stop taking and changed to a different med? Thanks

## 2019-06-17 ENCOUNTER — Encounter: Payer: Self-pay | Admitting: Licensed Clinical Social Worker

## 2019-06-17 ENCOUNTER — Ambulatory Visit (INDEPENDENT_AMBULATORY_CARE_PROVIDER_SITE_OTHER): Payer: Medicaid Other | Admitting: Licensed Clinical Social Worker

## 2019-06-17 ENCOUNTER — Other Ambulatory Visit: Payer: Self-pay

## 2019-06-17 DIAGNOSIS — F419 Anxiety disorder, unspecified: Secondary | ICD-10-CM

## 2019-06-17 NOTE — BH Specialist Note (Signed)
Integrated Behavioral Health Visit via Telemedicine (Telephone)  06/17/2019 Haskell Riling [id number]   Session Start time: 1:45  Session End time: 2:10 Total time: 25 minutes  Referring Provider: Dr. Tarri Abernethy Type of Visit: Telephonic Patient location: Home [facility] Provider location: Office All persons participating in visit: Patient, [facility], and [facility] intern  Confirmed patient's address: Yes  Confirmed patient's phone number: Yes  Any changes to demographics: No   Discussed confidentiality: Yes    The following statements were read to the patient and/or legal guardian that are established with the [facility] Provider.  "The purpose of this phone visit is to provide behavioral health care while limiting exposure to the coronavirus (COVID19).  There is a possibility of technology failure and discussed alternative modes of communication if that failure occurs."  "By engaging in this telephone visit, you consent to the provision of healthcare.  Additionally, you authorize for your insurance to be billed for the services provided during this telephone visit."   Patient and/or legal guardian consented to telephone visit: Yes   PRESENTING CONCERNS: Patient and/or family reports the following symptoms/concerns: anxiety, family stressors, sadness, and recent family dynamic changes. Duration of problem: recurrent since 2010; Severity of problem: mild-moderate   GOALS ADDRESSED: Patient will: 1.  Reduce symptoms of: anxiety and stress  2.  Increase knowledge and/or ability of: coping skills, healthy habits and stress reduction  3.  Demonstrate ability to: Increase healthy adjustment to current life circumstances and Increase adequate support systems for patient/family  INTERVENTIONS: Interventions utilized:  Motivational Interviewing, Solution-Focused Strategies, Brief CBT and Supportive Counseling Standardized Assessments completed: Not Needed  ASSESSMENT: Patient currently  experiencing anxiety and moments of sadness.  Patient reported feeling overwhelmed more recently than in pervious weeks. Patient is trying to change her environments when feeling overwhelmed/anxious. Trigger: Moving and wanting to be happy. Patient identified strategies that she is putting into effect to assist her in moving to another home. Patient worries that her dream to move will not actually happen.   Patient expressed a desire to talk to her husband more openly. Patient identified that her husband can become defensive.   Patient may benefit from counseling.  PLAN: 1. Follow up with behavioral health clinician on : two weeks.    [name], [facility]

## 2019-06-25 ENCOUNTER — Encounter: Payer: Self-pay | Admitting: *Deleted

## 2019-06-30 ENCOUNTER — Ambulatory Visit: Payer: Medicaid Other | Admitting: Licensed Clinical Social Worker

## 2019-06-30 ENCOUNTER — Telehealth: Payer: Self-pay | Admitting: Licensed Clinical Social Worker

## 2019-06-30 NOTE — Telephone Encounter (Signed)
Patient was called for her scheduled visit. Patient did not answer, and no vm was available.

## 2019-08-04 ENCOUNTER — Encounter: Payer: Self-pay | Admitting: Licensed Clinical Social Worker

## 2019-08-04 ENCOUNTER — Ambulatory Visit (INDEPENDENT_AMBULATORY_CARE_PROVIDER_SITE_OTHER): Payer: Medicaid Other | Admitting: Licensed Clinical Social Worker

## 2019-08-04 ENCOUNTER — Other Ambulatory Visit: Payer: Self-pay

## 2019-08-04 DIAGNOSIS — F419 Anxiety disorder, unspecified: Secondary | ICD-10-CM

## 2019-08-04 NOTE — BH Specialist Note (Signed)
Integrated Behavioral Health Visit via Telemedicine (Telephone)  08/04/2019 Kristen Frost [id number]   Session Start time: 9:08  Session End time: 9:35 Total time: 25 minutes  Referring Provider: Dr. Tarri Abernethy Type of Visit: Telephonic Patient location: Home [facility] Provider location: Office All persons participating in visit: Patient and [facility]  Confirmed patient's address: Yes  Confirmed patient's phone number: Yes  Any changes to demographics: No   Discussed confidentiality: Yes    The following statements were read to the patient and/or legal guardian that are established with the [facility] Provider.  "The purpose of this phone visit is to provide behavioral health care while limiting exposure to the coronavirus (COVID19).  There is a possibility of technology failure and discussed alternative modes of communication if that failure occurs."  "By engaging in this telephone visit, you consent to the provision of healthcare.  Additionally, you authorize for your insurance to be billed for the services provided during this telephone visit."   Patient and/or legal guardian consented to telephone visit: Yes   PRESENTING CONCERNS: Patient and/or family reports the following symptoms/concerns: anxiety, family stressors, sadness, and recent family dynamic changes. Duration of problem: recurrent since 2010; Severity of problem: mild-moderate  GOALS ADDRESSED: Patient will: 1.  Reduce symptoms of: anxiety and stress  2.  Increase knowledge and/or ability of: coping skills, healthy habits and stress reduction  3.  Demonstrate ability to: Increase healthy adjustment to current life circumstances and Increase adequate support systems for patient/family  INTERVENTIONS: Interventions utilized:  Brief CBT and Supportive Counseling Standardized Assessments completed: assessed for SI, HI, and self-harm.  ASSESSMENT: Patient currently experiencing mild to moderate levels of anxiety  due to ongoing stressful family situations. Patient processed a recent family visit that her step-son had to endure, and she processed how the visit negatively impacted the family. Patient is continuing to try to provide stability for the patient, and she is hopeful that she will be able to keep her stepson in their custody. Patient does have brief periods of panic due to events that she feels out of control with.    Patient may benefit from counseling.  PLAN: 1. Follow up with behavioral health clinician on : one to two weeks.  [name], [facility], [place]

## 2019-08-11 ENCOUNTER — Telehealth: Payer: Self-pay | Admitting: Licensed Clinical Social Worker

## 2019-08-11 NOTE — Telephone Encounter (Signed)
Patient was called to move her appointment time. Patient agreed, and she will be moved to 10:00 tomorrow.

## 2019-08-12 ENCOUNTER — Other Ambulatory Visit: Payer: Self-pay

## 2019-08-12 ENCOUNTER — Ambulatory Visit: Payer: Medicaid Other | Admitting: Licensed Clinical Social Worker

## 2019-08-12 ENCOUNTER — Encounter: Payer: Self-pay | Admitting: Licensed Clinical Social Worker

## 2019-08-12 ENCOUNTER — Ambulatory Visit (INDEPENDENT_AMBULATORY_CARE_PROVIDER_SITE_OTHER): Payer: Medicaid Other | Admitting: Licensed Clinical Social Worker

## 2019-08-12 DIAGNOSIS — F419 Anxiety disorder, unspecified: Secondary | ICD-10-CM

## 2019-08-12 NOTE — BH Specialist Note (Signed)
Integrated Behavioral Health Visit via Telemedicine (Telephone)  08/12/2019 Kristen Frost [id number]   Session Start time: 10:00  Session End time: 10:30 Total time: 30 minutes  Referring Provider: Dr. Tarri Abernethy Type of Visit: Telephonic Patient location: Home [facility] Provider location: Office All persons participating in visit: [facility] and patient  Confirmed patient's address: Yes  Confirmed patient's phone number: Yes  Any changes to demographics: No   Discussed confidentiality: Yes    The following statements were read to the patient and/or legal guardian that are established with the [facility] Provider.  "The purpose of this phone visit is to provide behavioral health care while limiting exposure to the coronavirus (COVID19).  There is a possibility of technology failure and discussed alternative modes of communication if that failure occurs."  "By engaging in this telephone visit, you consent to the provision of healthcare.  Additionally, you authorize for your insurance to be billed for the services provided during this telephone visit."   Patient and/or legal guardian consented to telephone visit: Yes   PRESENTING CONCERNS: Patient and/or family reports the following symptoms/concerns: anxiety, family stressors, sadness, and recent family dynamic changes. Duration of problem: recurrent since 2010; Severity of problem: mild-moderate  GOALS ADDRESSED: Patient will: 1.  Reduce symptoms of: anxiety and stress  2.  Increase knowledge and/or ability of: coping skills, healthy habits and stress reduction  3.  Demonstrate ability to: Increase healthy adjustment to current life circumstances and Increase adequate support systems for patient/family  INTERVENTIONS: Interventions utilized:  Motivational Interviewing, Brief CBT and Supportive Counseling Standardized Assessments completed: Not Needed  ASSESSMENT: Patient currently experiencing mild to moderate levels of  anxiety due to ongoing changes in her family system. Patient reported that she will keep her worries to herself. Patient is continuing to work on gaining permanent custody of her step-son with her spouse. Patient wants to move, and she is trying to move into a different home. Patient expressed her frustrations regarding not moving, and feeling her spouse is not putting any effort into the moving.   Patient may benefit from counseling.  PLAN: 1. Follow up with behavioral health clinician on : one to two weeks.  [name], [facility], [place]

## 2019-08-26 ENCOUNTER — Ambulatory Visit: Payer: Medicaid Other | Admitting: Licensed Clinical Social Worker

## 2019-09-01 ENCOUNTER — Ambulatory Visit (INDEPENDENT_AMBULATORY_CARE_PROVIDER_SITE_OTHER): Payer: Medicaid Other | Admitting: Licensed Clinical Social Worker

## 2019-09-01 ENCOUNTER — Other Ambulatory Visit: Payer: Self-pay

## 2019-09-01 DIAGNOSIS — F419 Anxiety disorder, unspecified: Secondary | ICD-10-CM

## 2019-09-01 NOTE — BH Specialist Note (Signed)
Integrated Behavioral Health Visit via Telemedicine (Telephone)  09/01/2019 Haskell Riling [id number]   Session Start time: 10:00  Session End time: 10:30 Total time: 30 minutes  Referring Provider: Dr. Tarri Abernethy Type of Visit: Telephonic Patient location: Home [facility] Provider location: Office All persons participating in visit: [facility] and patient  Confirmed patient's address: Yes  Confirmed patient's phone number: Yes  Any changes to demographics: No   Discussed confidentiality: Yes    The following statements were read to the patient and/or legal guardian that are established with the [facility] Provider.  "The purpose of this phone visit is to provide behavioral health care while limiting exposure to the coronavirus (COVID19).  There is a possibility of technology failure and discussed alternative modes of communication if that failure occurs."  "By engaging in this telephone visit, you consent to the provision of healthcare.  Additionally, you authorize for your insurance to be billed for the services provided during this telephone visit."   Patient and/or legal guardian consented to telephone visit: Yes   PRESENTING CONCERNS: Patient and/or family reports the following symptoms/concerns: anxiety, family stressors, sadness, and recent family dynamic changes. Duration of problem: recurrent since 2010; Severity of problem: mild  GOALS ADDRESSED: Patient will: 1.  Reduce symptoms of: anxiety and stress  2.  Increase knowledge and/or ability of: coping skills, healthy habits and stress reduction  3.  Demonstrate ability to: Increase healthy adjustment to current life circumstances and Increase adequate support systems for patient/family  INTERVENTIONS: Interventions utilized:  Mindfulness or Relaxation Training, Brief CBT and Supportive Counseling Standardized Assessments completed: Not Needed  ASSESSMENT: Patient currently experiencing mild levels of anxiety.  Patient was more at peace this session due to her step-son having more stability. Patient is anticipating the next court date. Patient is trying to provide a healthy space for her step-son to grow. Patient is still hopeful to move, and find a resident that she can call home.   Patient may benefit from counseling.  PLAN: 1. Follow up with behavioral health clinician on : one to two weeks.   [name], [facility], [place]

## 2019-09-04 ENCOUNTER — Telehealth: Payer: Self-pay | Admitting: Student

## 2019-09-04 DIAGNOSIS — N816 Rectocele: Secondary | ICD-10-CM

## 2019-09-04 NOTE — Telephone Encounter (Signed)
Spoke with [name] regarding her request for referral to Ob/Gyn. She mentions that she has had years of suprapubic discomfort. Previously seen by ob/gyn associated with outside system for this issue but she does not see a gynecologist in [place] currently. She mentions that recently she was seen by Dr.Danis with GI for her hemorrhoids and at the time was recommended to f/u with Ob/Gyn for possible prolapse but she wasn't sure which office to see and thought she may need referral.   - Referral placed

## 2019-09-04 NOTE — Telephone Encounter (Signed)
pls contact pt [phone]

## 2019-09-04 NOTE — Telephone Encounter (Signed)
Pt states she would like a referral to a GYN, states she thinks her uterus is "falling" a little. Will send to yellow team and lelaS.

## 2019-09-07 ENCOUNTER — Encounter: Payer: Self-pay | Admitting: Licensed Clinical Social Worker

## 2019-09-09 ENCOUNTER — Other Ambulatory Visit: Payer: Self-pay

## 2019-09-09 ENCOUNTER — Encounter: Payer: Self-pay | Admitting: Licensed Clinical Social Worker

## 2019-09-09 ENCOUNTER — Ambulatory Visit (INDEPENDENT_AMBULATORY_CARE_PROVIDER_SITE_OTHER): Payer: Medicaid Other | Admitting: Licensed Clinical Social Worker

## 2019-09-09 DIAGNOSIS — F419 Anxiety disorder, unspecified: Secondary | ICD-10-CM

## 2019-09-09 NOTE — BH Specialist Note (Signed)
Integrated Behavioral Health Visit via Telemedicine (Telephone)  09/09/2019 Haskell Riling [id number]   Session Start time: 12:30  Session End time: 1:00 Total time: 30 minutes  Referring Provider: Dr. Tarri Abernethy Type of Visit: Telephonic Patient location: Home [facility] Provider location: Office All persons participating in visit: Patient and [facility]  Confirmed patient's address: Yes  Confirmed patient's phone number: Yes  Any changes to demographics: No  Discussed confidentiality: Yes    The following statements were read to the patient and/or legal guardian that are established with the [facility] Provider.  "The purpose of this phone visit is to provide behavioral health care while limiting exposure to the coronavirus (COVID19).  There is a possibility of technology failure and discussed alternative modes of communication if that failure occurs."  "By engaging in this telephone visit, you consent to the provision of healthcare.  Additionally, you authorize for your insurance to be billed for the services provided during this telephone visit."   Patient and/or legal guardian consented to telephone visit: Yes   PRESENTING CONCERNS: Patient and/or family reports the following symptoms/concerns:anxiety, family stressors, sadness, and recent family dynamic changes.  Duration of problem: recurrent since 2010; Severity of problem: mild   GOALS ADDRESSED: Patient will: 1.  Reduce symptoms of: anxiety and stress  2.  Increase knowledge and/or ability of: coping skills, healthy habits and stress reduction  3.  Demonstrate ability to: Increase healthy adjustment to current life circumstances and Increase adequate support systems for patient/family  INTERVENTIONS: Interventions utilized:  Motivational Interviewing, Brief CBT and Supportive Counseling Standardized Assessments completed: Not Needed  ASSESSMENT: Patient currently experiencing mild to moderate levels of anxiety.  Patient wants to focus on her increasing her self-care. Patient signed up for weightwatchers, and she is hopeful that she will improve her lifestyle. Patient continues to be stressed regarding their custody case over their step-son. Patient is trying to communicate her needs and wants with her spouse.   Patient may benefit from counseling.  PLAN: 1. Follow up with behavioral health clinician on : two weeks.   [name], [facility], [place]

## 2019-09-13 ENCOUNTER — Other Ambulatory Visit: Payer: Self-pay

## 2019-09-13 ENCOUNTER — Encounter (HOSPITAL_COMMUNITY): Payer: Self-pay

## 2019-09-13 ENCOUNTER — Emergency Department (HOSPITAL_COMMUNITY): Payer: Medicaid Other

## 2019-09-13 ENCOUNTER — Emergency Department (HOSPITAL_COMMUNITY)
Admission: EM | Admit: 2019-09-13 | Discharge: 2019-09-13 | Disposition: A | Discharge status: Home / Self Care | Payer: Medicaid Other | Attending: Emergency Medicine | Admitting: Emergency Medicine

## 2019-09-13 DIAGNOSIS — S39012A Strain of muscle, fascia and tendon of lower back, initial encounter: Secondary | ICD-10-CM | POA: Insufficient documentation

## 2019-09-13 DIAGNOSIS — S79912A Unspecified injury of left hip, initial encounter: Secondary | ICD-10-CM | POA: Diagnosis not present

## 2019-09-13 DIAGNOSIS — Y92481 Parking lot as the place of occurrence of the external cause: Secondary | ICD-10-CM | POA: Insufficient documentation

## 2019-09-13 DIAGNOSIS — W010XXA Fall on same level from slipping, tripping and stumbling without subsequent striking against object, initial encounter: Secondary | ICD-10-CM | POA: Diagnosis not present

## 2019-09-13 DIAGNOSIS — E1122 Type 2 diabetes mellitus with diabetic chronic kidney disease: Secondary | ICD-10-CM | POA: Diagnosis not present

## 2019-09-13 DIAGNOSIS — Y998 Other external cause status: Secondary | ICD-10-CM | POA: Diagnosis not present

## 2019-09-13 DIAGNOSIS — Z794 Long term (current) use of insulin: Secondary | ICD-10-CM | POA: Diagnosis not present

## 2019-09-13 DIAGNOSIS — Z79899 Other long term (current) drug therapy: Secondary | ICD-10-CM | POA: Insufficient documentation

## 2019-09-13 DIAGNOSIS — S79911A Unspecified injury of right hip, initial encounter: Secondary | ICD-10-CM | POA: Diagnosis not present

## 2019-09-13 DIAGNOSIS — I129 Hypertensive chronic kidney disease with stage 1 through stage 4 chronic kidney disease, or unspecified chronic kidney disease: Secondary | ICD-10-CM | POA: Insufficient documentation

## 2019-09-13 DIAGNOSIS — N182 Chronic kidney disease, stage 2 (mild): Secondary | ICD-10-CM | POA: Diagnosis not present

## 2019-09-13 DIAGNOSIS — Y9301 Activity, walking, marching and hiking: Secondary | ICD-10-CM | POA: Insufficient documentation

## 2019-09-13 DIAGNOSIS — M545 Low back pain: Secondary | ICD-10-CM | POA: Diagnosis not present

## 2019-09-13 DIAGNOSIS — W19XXXA Unspecified fall, initial encounter: Secondary | ICD-10-CM

## 2019-09-13 DIAGNOSIS — S300XXA Contusion of lower back and pelvis, initial encounter: Secondary | ICD-10-CM

## 2019-09-13 DIAGNOSIS — S3992XA Unspecified injury of lower back, initial encounter: Secondary | ICD-10-CM | POA: Diagnosis not present

## 2019-09-13 MED ORDER — METHOCARBAMOL 500 MG PO TABS: 500.0000 mg | ORAL_TABLET | Freq: Two times a day (BID) | ORAL | 0 refills | Status: DC

## 2019-09-13 NOTE — ED Provider Notes (Signed)
[facility] DEPT Provider Note   CSN: [id number] Arrival date & time: 09/13/19  1342     History Chief Complaint  Patient presents with   Kristen Frost is a 64 y.o. female with a past medical history of arthritis, hypertension, hyperlipidemia, anxiety presenting to the ED with a chief complaint of fall.  States that she was walking out of [employer] just prior to arrival when she slipped on a puddle of water on the ground.  She fell backwards onto her lower back.  She denies any head injury or loss of consciousness as she states that she was able to grab onto her daughter who was standing next to her.  She was able to ambulate back into the store to file a report with the help of her daughter.  She reports pain to her right lower back radiating down her right leg.  She denies any prior back surgeries, numbness in arms or legs, loss of bowel or bladder function, headache, vision changes.  HPI     Past Medical History:  Diagnosis Date   Anxiety    Arthritis    Bleeding hemorrhoids    grade 2   Diverticulosis of colon    Full dentures    History of colon polyps    Hyperlipidemia    Hypertension    followed by pcp   (04-30-2019  per pt had stress test approx. 2005, was told normal (not available in epic)   IDA (iron deficiency anemia)    OSA on CPAP    followed by dr Halford Chessman ([facility])    (04-30-2019  pt  stated uses every night)   Restless leg syndrome    Type 2 diabetes mellitus ([place])    followed by pcp   (04-30-2019  pt stated does not check blood sugar at home)    Patient Active Problem List   Diagnosis Date Noted   CKD (chronic kidney disease) stage 2, GFR 60-89 ml/min 08/09/2016   Healthcare maintenance 05/17/2016   Vitamin D deficiency 03/09/2016   Dyslipidemia 03/02/2016   Morbid obesity ([place]) 03/01/2016   T2DM (type 2 diabetes mellitus) ([place]) 03/01/2016   HTN (hypertension) 11/17/2015   OSA  (obstructive sleep apnea) 12/04/2011   RLS (restless legs syndrome) 12/04/2011    Past Surgical History:  Procedure Laterality Date   CHOLECYSTECTOMY N/A 06/07/2016   Procedure: LAPAROSCOPIC CHOLECYSTECTOMY;  Surgeon: Georganna Skeans, MD;  Location: [facility];  Service: General;  Laterality: N/A;   COLONOSCOPY WITH PROPOFOL  last one 04-10-2018    dr Loletha Carrow   ROTATOR CUFF REPAIR Left 2007   approx.   TRANSANAL HEMORRHOIDAL DEARTERIALIZATION N/A 05/07/2019   Procedure: TRANSANAL HEMORRHOIDAL DEARTERIALIZATION;  Surgeon: Leighton Ruff, MD;  Location: [facility];  Service: General;  Laterality: N/A;     OB History   No obstetric history on file.     Family History  Problem Relation Age of Onset   Allergies Father    Alcohol abuse Father    Tuberculosis Father        deceased from TB   Allergies Other        sibling   Asthma Other        sibling   Hypertension Other        sibling   Migraines Other        sibling   Rashes / Skin problems Other        sibling   Gout  Other        sibling    Social History   Tobacco Use   Smoking status: Never Smoker   Smokeless tobacco: Never Used  Vaping Use   Vaping Use: Never used  Substance Use Topics   Alcohol use: No   Drug use: Never    Home Medications Prior to Admission medications   Medication Sig Start Date End Date Taking? Authorizing Provider  amLODipine (NORVASC) 10 MG tablet TAKE 1 TABLET BY MOUTH DAILY Patient taking differently: Take 10 mg by mouth daily. TAKE 1 TABLET BY MOUTH DAILY 08/21/18   Ina Homes, MD  aspirin EC 81 MG tablet Take 81 mg by mouth daily.    [provider]  atorvastatin (LIPITOR) 40 MG tablet Take 1 tablet (40 mg total) by mouth daily. 05/04/19 05/03/20  Ina Homes, MD  Cholecalciferol (VITAMIN D) 2000 units CAPS Take 1 capsule (2,000 Units total) by mouth daily. Patient taking differently: Take 2,000 Units by mouth daily.  05/06/17   Ina Homes, MD  ferrous sulfate 325 (65 FE) MG tablet Take 1 tablet (325 mg total) by mouth daily with breakfast. Patient taking differently: Take 325 mg by mouth daily with breakfast.  05/06/17 04/30/19  Ina Homes, MD  fluticasone (FLONASE) 50 MCG/ACT nasal spray Place 1 spray into both nostrils daily. Patient taking differently: Place 1 spray into both nostrils daily as needed.  11/11/18   Ina Homes, MD  Insulin Pen Needle (PEN NEEDLES) 31G X 5 MM MISC 1 application by Does not apply route daily. 05/21/19   Helberg, Larkin Ina, MD  liraglutide (VICTOZA) 18 MG/3ML SOPN Inject 0.3 mLs (1.8 mg total) into the skin daily. 05/21/19   Ina Homes, MD  metFORMIN (GLUCOPHAGE-XR) 500 MG 24 hr tablet Take 2 tablets (1,000 mg total) by mouth daily with breakfast. Patient taking differently: Take 1,000 mg by mouth daily with breakfast.  02/25/19   Ina Homes, MD  methocarbamol (ROBAXIN) 500 MG tablet Take 1 tablet (500 mg total) by mouth 2 (two) times daily. 09/13/19   Elayjah Chaney, PA-C  oxyCODONE (OXY IR/ROXICODONE) 5 MG immediate release tablet Take 1-2 tablets (5-10 mg total) by mouth every 6 (six) hours as needed. 2/84/13   Leighton Ruff, MD    Allergies    Patient has no known allergies.  Review of Systems   Review of Systems  Constitutional: Negative for chills and fever.  Eyes: Negative for visual disturbance.  Gastrointestinal: Negative for vomiting.  Musculoskeletal: Positive for back pain and myalgias.  Neurological: Negative for weakness, numbness and headaches.    Physical Exam Updated Vital Signs BP (!) 149/76 (BP Location: Right Arm)    Pulse 90    Temp 98.2 F (36.8 C) (Oral)    Resp 18    SpO2 98%   Physical Exam Vitals and nursing note reviewed.  Constitutional:      General: She is not in acute distress.    Appearance: She is well-developed. She is not diaphoretic.     Comments: Anxious.  HENT:     Head: Normocephalic and atraumatic.  Eyes:     General: No scleral  icterus.    Conjunctiva/sclera: Conjunctivae normal.  Cardiovascular:     Rate and Rhythm: Normal rate and regular rhythm.     Heart sounds: Normal heart sounds.  Pulmonary:     Effort: Pulmonary effort is normal. No respiratory distress.     Breath sounds: Normal breath sounds.  Musculoskeletal:     Cervical back:  Normal range of motion.     Lumbar back: Tenderness present.       Back:     Comments: No midline spinal tenderness present in lumbar, thoracic or cervical spine. No step-off palpated. No visible bruising, edema or temperature change noted. No objective signs of numbness present. No saddle anesthesia. 2+ DP pulses bilaterally. Sensation intact to light touch. Strength 5/5 in bilateral lower extremities.  Skin:    Findings: No rash.  Neurological:     Mental Status: She is alert.     ED Results / Procedures / Treatments   Labs (all labs ordered are listed, but only abnormal results are displayed) Labs Reviewed - No data to display  EKG None  Radiology DG Lumbar Spine Complete  Result Date: 09/13/2019 CLINICAL DATA:  Low back pain post fall, BILATERAL buttock pain radiating down posterior aspect of both legs, tailbone pain EXAM: LUMBAR SPINE - COMPLETE 4+ VIEW COMPARISON:  None FINDINGS: Five non-rib-bearing lumbar vertebra. Osseous mineralization normal for technique. Facet degenerative changes L4-L5 and L5-S1. Vertebral body and disc space heights maintained. Minimal anterolisthesis L4-L5. No fracture, additional subluxation, or bone destruction. No spondylolysis. SI joints preserved. IMPRESSION: Facet degenerative changes lower lumbar spine with minimal anterolisthesis L4-L5. No acute abnormalities. Electronically Signed   By: Lavonia Dana M.D.   On: 09/13/2019 16:33   DG Hips Bilat W or Wo Pelvis 3-4 Views  Result Date: 09/13/2019 CLINICAL DATA:  Low back pain post fall, BILATERAL buttock pain radiating down posterior aspect of both legs, tailbone pain EXAM: DG HIP  (WITH OR WITHOUT PELVIS) 3-4V BILAT COMPARISON:  None. FINDINGS: Hip and SI joint spaces preserved. Osseous mineralization normal. No fracture, dislocation, or bone destruction. Degenerative facet disease changes at visualized lower lumbar spine. IMPRESSION: Negative radiographs of the hips. Electronically Signed   By: Lavonia Dana M.D.   On: 09/13/2019 16:36    Procedures Procedures (including critical care time)  Medications Ordered in ED Medications - No data to display  ED Course  I have reviewed the triage vital signs and the nursing notes.  Pertinent labs & imaging results that were available during my care of the patient were reviewed by me and considered in my medical decision making (see chart for details).    MDM Rules/Calculators/A&P                          Patient denies any concerning symptoms suggestive of cauda equina requiring urgent imaging at this time such as loss of sensation in the lower extremities, lower extremity weakness, loss of bowel or bladder control, saddle anesthesia, urinary retention, fever/chills, IVDU. Exam demonstrated no  weakness on exam today. Doubt pelvic or urinary pathology for patient's acute back pain, as patient denies urinary symptoms. Doubt AAA as cause of patient's back pain as patient lacks major risk factors, had no abdominal TTP, and has symmetric and intact distal pulses.  Due to her preceding injury, x-rays of the hips and lumbar spine were obtained which were negative for acute abnormality.  Suspect that her symptoms could be due to a contusion.  Treat with muscle relaxer and over-the-counter pain medication, heat therapy. Patient given strict return precautions for any symptoms indicating worsening neurologic function in the lower extremities.   Patient is hemodynamically stable, in NAD, and able to ambulate in the ED. Evaluation does not show pathology that would require ongoing emergent intervention or inpatient treatment. I explained the  diagnosis to the patient.  Pain has been managed and has no complaints prior to discharge. Patient is comfortable with above plan and is stable for discharge at this time. All questions were answered prior to disposition. Strict return precautions for returning to the ED were discussed. Encouraged follow up with PCP.   An After Visit Summary was printed and given to the patient.   Portions of this note were generated with [employer]. Dictation errors may occur despite best attempts at proofreading.  Final Clinical Impression(s) / ED Diagnoses Final diagnoses:  Fall, initial encounter  Strain of lumbar region, initial encounter  Contusion of lower back, initial encounter    Rx / DC Orders ED Discharge Orders         Ordered    methocarbamol (ROBAXIN) 500 MG tablet  2 times daily     Discontinue  Reprint     09/13/19 [place], Lilliahna Schubring, PA-C 09/13/19 1729    Hayden Rasmussen, MD 09/14/19 1157

## 2019-09-13 NOTE — ED Triage Notes (Signed)
Pt presents with c/o fall that occurred earlier today. Pt reports back pain, buttocks pain, right leg pain. Pt was ambulatory to triage.

## 2019-09-13 NOTE — Discharge Instructions (Addendum)
Use the muscle relaxer along with Tylenol as needed. You can apply heating pad to your lower back to help with pain as well. Follow-up with your primary care provider. Return to the ER for worsening back pain, if you develop numbness in your arms or legs, losing control of your bowels or bladder, additional injuries, chest pain.

## 2019-09-14 ENCOUNTER — Telehealth: Payer: Self-pay | Admitting: *Deleted

## 2019-09-14 NOTE — Telephone Encounter (Signed)
Contacted pt to complete transition of care assessment:Transition Care Management Follow-up Telephone Call  . Medicaid Managed Care Transition Call Status:MM [facility] Call Made  . Date of discharge and from where: [facility]; 09/13/19 . How have you been since you were released from the hospital? still having pain; just picked up medication . Any questions or concerns? No   Items Reviewed: Marland Kitchen Did the pt receive and understand the discharge instructions provided? Yes  . Medications obtained and verified? Yes  . Any new allergies since your discharge? No  . Dietary orders reviewed? yes . Do you have support at home? Yes famly Functional Questionnaire: (I = Independent and D = Dependent)  ADLs: Independent Bathing/Dressing:Independent Meal Prep: Independent Eating: Independent Maintaining continence: Independent Transferring/Ambulation: Independent Managing Meds: Independent Follow up appointments reviewed:  PCP Hospital f/u appt confirmed? No  pt to call Dr Johnney Ou on 09/14/19 to schedule follow up appt Specialist Hospital f/u appt confirmed? NA  Are transportation arrangements needed? No   If their condition worsens, is the pt aware to call PCP or go to the EmergencyDept.? Yes  Was the patient provided with contact information for the PCP's office or ED? yes  Was to pt encouraged to call back with questions or concerns? Yes  Lenor Coffin, RN, BSN, [place] Patient [place] [phone]

## 2019-09-15 ENCOUNTER — Emergency Department (HOSPITAL_COMMUNITY)
Admission: EM | Admit: 2019-09-15 | Discharge: 2019-09-15 | Disposition: A | Discharge status: Home / Self Care | Payer: Medicaid Other | Attending: Emergency Medicine | Admitting: Emergency Medicine

## 2019-09-15 ENCOUNTER — Other Ambulatory Visit: Payer: Self-pay

## 2019-09-15 ENCOUNTER — Emergency Department (HOSPITAL_COMMUNITY): Payer: Medicaid Other

## 2019-09-15 DIAGNOSIS — E1122 Type 2 diabetes mellitus with diabetic chronic kidney disease: Secondary | ICD-10-CM | POA: Diagnosis not present

## 2019-09-15 DIAGNOSIS — S0990XA Unspecified injury of head, initial encounter: Secondary | ICD-10-CM | POA: Diagnosis not present

## 2019-09-15 DIAGNOSIS — N182 Chronic kidney disease, stage 2 (mild): Secondary | ICD-10-CM | POA: Insufficient documentation

## 2019-09-15 DIAGNOSIS — Z794 Long term (current) use of insulin: Secondary | ICD-10-CM | POA: Diagnosis not present

## 2019-09-15 DIAGNOSIS — R531 Weakness: Secondary | ICD-10-CM | POA: Insufficient documentation

## 2019-09-15 DIAGNOSIS — Z79899 Other long term (current) drug therapy: Secondary | ICD-10-CM | POA: Diagnosis not present

## 2019-09-15 DIAGNOSIS — Z7982 Long term (current) use of aspirin: Secondary | ICD-10-CM | POA: Diagnosis not present

## 2019-09-15 DIAGNOSIS — M791 Myalgia, unspecified site: Secondary | ICD-10-CM | POA: Insufficient documentation

## 2019-09-15 DIAGNOSIS — I129 Hypertensive chronic kidney disease with stage 1 through stage 4 chronic kidney disease, or unspecified chronic kidney disease: Secondary | ICD-10-CM | POA: Diagnosis not present

## 2019-09-15 DIAGNOSIS — R110 Nausea: Secondary | ICD-10-CM | POA: Diagnosis not present

## 2019-09-15 LAB — CBC
HCT: 40.6 % (ref 36.0–46.0)
Hemoglobin: 13.1 g/dL (ref 12.0–15.0)
MCH: 27.3 pg (ref 26.0–34.0)
MCHC: 32.3 g/dL (ref 30.0–36.0)
MCV: 84.6 fL (ref 80.0–100.0)
Platelets: 282 10*3/uL (ref 150–400)
RBC: 4.8 MIL/uL (ref 3.87–5.11)
RDW: 15.3 % (ref 11.5–15.5)
WBC: 5.8 10*3/uL (ref 4.0–10.5)
nRBC: 0 % (ref 0.0–0.2)

## 2019-09-15 LAB — BASIC METABOLIC PANEL
Anion gap: 6 (ref 5–15)
BUN: 14 mg/dL (ref 8–23)
CO2: 28 mmol/L (ref 22–32)
Calcium: 9.3 mg/dL (ref 8.9–10.3)
Chloride: 106 mmol/L (ref 98–111)
Creatinine, Ser: 0.96 mg/dL (ref 0.44–1.00)
GFR calc Af Amer: >60 mL/min (ref >60)
GFR calc non Af Amer: >60 mL/min (ref >60)
Glucose, Bld: 98 mg/dL (ref 70–99)
Potassium: 4.3 mmol/L (ref 3.5–5.1)
Sodium: 140 mmol/L (ref 135–145)

## 2019-09-15 LAB — URINALYSIS, ROUTINE W REFLEX MICROSCOPIC
Bilirubin Urine: NEGATIVE
Glucose, UA: NEGATIVE mg/dL
Hgb urine dipstick: NEGATIVE
Ketones, ur: NEGATIVE mg/dL
Leukocytes,Ua: NEGATIVE
Nitrite: NEGATIVE
Protein, ur: NEGATIVE mg/dL
Specific Gravity, Urine: 1.012 (ref 1.005–1.030)
pH: 5 (ref 5.0–8.0)

## 2019-09-15 LAB — CBG MONITORING, ED
Glucose-Capillary: 68 mg/dL — ABNORMAL LOW (ref 70–99)
Glucose-Capillary: 95 mg/dL (ref 70–99)

## 2019-09-15 MED ORDER — SODIUM CHLORIDE 0.9% FLUSH
3.0000 mL | Freq: Once | INTRAVENOUS | Status: AC
  Administered 2019-09-15: 3 mL via INTRAVENOUS

## 2019-09-15 MED ORDER — LIDOCAINE 5 % EX PTCH: 1.0000 | MEDICATED_PATCH | CUTANEOUS | 0 refills | Status: DC

## 2019-09-15 NOTE — ED Notes (Signed)
BS 68, Pt alert oriented, given gingerale . PA aware

## 2019-09-15 NOTE — ED Provider Notes (Signed)
[facility] DEPT Provider Note   CSN: [id number] Arrival date & time: 09/15/19  7654     History Chief Complaint  Patient presents with  . Weakness    Kristen Frost is a 64 y.o. female with past medical history of hyperlipidemia, hypertension, OSA, type 2 diabetes, chronic kidney disease stage II that presents emergency department today for weakness and nausea.  Per chart review patient was seen 2 days ago for fall, injured her lower back with plain films of the lumbar spine and bilateral hips negative for injury.  Patient comes in today stating that she has felt weak with increasing muscle spasms and dizziness with nausea.  States that she felt nauseous after she started taking medication, Robaxin which was prescribed to her at discharge.  States that she also started feeling dizzy with one episode of room spinning sensation that lasted 1 minute this morning.  Has not happened again.  States that she has continued to feel lightheaded.  States that she actually thinks she did hit her head, is unsure.  Denies any LOC.  States that the fall happened so fast although previous provider note states no hitting head.  States that she is having myalgias in her shoulders, back. Did not hit her shoulder. No abdominal pain, chest pain, shortness of breath, urinary symptoms, diarrhea.  No episodes of vomiting.  No headache, vision changes, paresthesias, difficulty walking.  States that she has been taking her medications, has not been able to eat because she is nauseous.  Denies any fevers, chills, URI-like symptoms, saddle paresthesias, IV drug use, cancer, pain radiating down her legs.  Is able to walk and go about her daily life for the past 2 days.  No recurrent falls.  HPI     Past Medical History:  Diagnosis Date  . Anxiety   . Arthritis   . Bleeding hemorrhoids    grade 2  . Diverticulosis of colon   . Full dentures   . History of colon polyps   .  Hyperlipidemia   . Hypertension    followed by pcp   (04-30-2019  per pt had stress test approx. 2005, was told normal (not available in epic)  . IDA (iron deficiency anemia)   . OSA on CPAP    followed by dr Halford Chessman ([facility])    (04-30-2019  pt  stated uses every night)  . Restless leg syndrome   . Type 2 diabetes mellitus ([place])    followed by pcp   (04-30-2019  pt stated does not check blood sugar at home)    Patient Active Problem List   Diagnosis Date Noted  . CKD (chronic kidney disease) stage 2, GFR 60-89 ml/min 08/09/2016  . Healthcare maintenance 05/17/2016  . Vitamin D deficiency 03/09/2016  . Dyslipidemia 03/02/2016  . Morbid obesity ([place]) 03/01/2016  . T2DM (type 2 diabetes mellitus) ([place]) 03/01/2016  . HTN (hypertension) 11/17/2015  . OSA (obstructive sleep apnea) 12/04/2011  . RLS (restless legs syndrome) 12/04/2011    Past Surgical History:  Procedure Laterality Date  . CHOLECYSTECTOMY N/A 06/07/2016   Procedure: LAPAROSCOPIC CHOLECYSTECTOMY;  Surgeon: Georganna Skeans, MD;  Location: [facility];  Service: General;  Laterality: N/A;  . COLONOSCOPY WITH PROPOFOL  last one 04-10-2018    dr Loletha Carrow  . ROTATOR CUFF REPAIR Left 2007   approx.  . TRANSANAL HEMORRHOIDAL DEARTERIALIZATION N/A 05/07/2019   Procedure: TRANSANAL HEMORRHOIDAL DEARTERIALIZATION;  Surgeon: Leighton Ruff, MD;  Location: [facility];  Service: General;  Laterality: N/A;     OB History   No obstetric history on file.     Family History  Problem Relation Age of Onset  . Allergies Father   . Alcohol abuse Father   . Tuberculosis Father        deceased from TB  . Allergies Other        sibling  . Asthma Other        sibling  . Hypertension Other        sibling  . Migraines Other        sibling  . Rashes / Skin problems Other        sibling  . Gout Other        sibling    Social History   Tobacco Use  . Smoking status: Never Smoker  . Smokeless tobacco: Never Used    Vaping Use  . Vaping Use: Never used  Substance Use Topics  . Alcohol use: No  . Drug use: Never    Home Medications Prior to Admission medications   Medication Sig Start Date End Date Taking? Authorizing Provider  amLODipine (NORVASC) 10 MG tablet TAKE 1 TABLET BY MOUTH DAILY Patient taking differently: Take 10 mg by mouth daily. TAKE 1 TABLET BY MOUTH DAILY 08/21/18   Ina Homes, MD  aspirin EC 81 MG tablet Take 81 mg by mouth daily.    [provider]  atorvastatin (LIPITOR) 40 MG tablet Take 1 tablet (40 mg total) by mouth daily. 05/04/19 05/03/20  Ina Homes, MD  Cholecalciferol (VITAMIN D) 2000 units CAPS Take 1 capsule (2,000 Units total) by mouth daily. Patient taking differently: Take 2,000 Units by mouth daily.  05/06/17   Ina Homes, MD  ferrous sulfate 325 (65 FE) MG tablet Take 1 tablet (325 mg total) by mouth daily with breakfast. Patient taking differently: Take 325 mg by mouth daily with breakfast.  05/06/17 04/30/19  Ina Homes, MD  fluticasone (FLONASE) 50 MCG/ACT nasal spray Place 1 spray into both nostrils daily. Patient taking differently: Place 1 spray into both nostrils daily as needed.  11/11/18   Ina Homes, MD  Insulin Pen Needle (PEN NEEDLES) 31G X 5 MM MISC 1 application by Does not apply route daily. 05/21/19   Helberg, Larkin Ina, MD  lidocaine (LIDODERM) 5 % Place 1 patch onto the skin daily. Remove & Discard patch within 12 hours or as directed by MD 09/15/19   Alfredia Client, PA-C  liraglutide (VICTOZA) 18 MG/3ML SOPN Inject 0.3 mLs (1.8 mg total) into the skin daily. 05/21/19   Ina Homes, MD  metFORMIN (GLUCOPHAGE-XR) 500 MG 24 hr tablet Take 2 tablets (1,000 mg total) by mouth daily with breakfast. Patient taking differently: Take 1,000 mg by mouth daily with breakfast.  02/25/19   Ina Homes, MD  methocarbamol (ROBAXIN) 500 MG tablet Take 1 tablet (500 mg total) by mouth 2 (two) times daily. 09/13/19   Khatri, Hina, PA-C   oxyCODONE (OXY IR/ROXICODONE) 5 MG immediate release tablet Take 1-2 tablets (5-10 mg total) by mouth every 6 (six) hours as needed. 8/84/16   Leighton Ruff, MD    Allergies    Patient has no known allergies.  Review of Systems   Review of Systems  Constitutional: Negative for chills, diaphoresis, fatigue and fever.  HENT: Negative for congestion, sore throat and trouble swallowing.   Eyes: Negative for pain and visual disturbance.  Respiratory: Negative for cough, shortness of breath and wheezing.   Cardiovascular:  Negative for chest pain, palpitations and leg swelling.  Gastrointestinal: Positive for nausea. Negative for abdominal distention, abdominal pain, diarrhea and vomiting.  Genitourinary: Negative for difficulty urinating.  Musculoskeletal: Positive for myalgias. Negative for back pain, neck pain and neck stiffness.  Skin: Negative for pallor.  Neurological: Positive for weakness. Negative for dizziness, speech difficulty and headaches.  Psychiatric/Behavioral: Negative for confusion.    Physical Exam Updated Vital Signs BP (!) 160/86 (BP Location: Left Arm)   Pulse 73   Temp 98.1 F (36.7 C)   Resp (!) 27   SpO2 99%   Physical Exam Constitutional:      General: She is not in acute distress.    Appearance: Normal appearance. She is not ill-appearing, toxic-appearing or diaphoretic.  HENT:     Mouth/Throat:     Mouth: Mucous membranes are moist.     Pharynx: Oropharynx is clear.  Eyes:     General: No scleral icterus.    Extraocular Movements: Extraocular movements intact.     Pupils: Pupils are equal, round, and reactive to light.  Cardiovascular:     Rate and Rhythm: Normal rate and regular rhythm.     Pulses: Normal pulses.     Heart sounds: Normal heart sounds.  Pulmonary:     Effort: Pulmonary effort is normal. No respiratory distress.     Breath sounds: Normal breath sounds. No stridor. No wheezing, rhonchi or rales.  Chest:     Chest wall: No  tenderness.  Abdominal:     General: Abdomen is flat. There is no distension.     Palpations: Abdomen is soft.     Tenderness: There is no abdominal tenderness. There is no guarding or rebound.  Musculoskeletal:        General: No swelling or tenderness. Normal range of motion.     Cervical back: Normal range of motion and neck supple. No rigidity or tenderness.     Right lower leg: No edema.     Left lower leg: No edema.     Comments: Myalgias in lower back, specifically on right lower back, where she was having pain last time.  No warmth, objective numbness, erythema, induration on back.  No midline tenderness.  Patient able to range back directions.  Able to range bilateral shoulders without any troubles.  No tenderness to shoulders.  Normal sensation, no weakness.  Radial pulse 2+.  Skin:    General: Skin is warm and dry.     Capillary Refill: Capillary refill takes less than 2 seconds.     Coloration: Skin is not pale.  Neurological:     General: No focal deficit present.     Mental Status: She is alert and oriented to person, place, and time.     Comments: Alert and oriented times 3. Clear speech. No facial droop. CNIII-XII grossly intact. Bilateral upper and lower extremities' sensation grossly intact. 5/5 symmetric strength with grip strength and with plantar and dorsi flexion bilaterally.Normal finger to nose bilaterally. Negative pronator drift. Negative Romberg sign. Gait is steady and intact.    Psychiatric:        Mood and Affect: Mood normal.        Behavior: Behavior normal.     ED Results / Procedures / Treatments   Labs (all labs ordered are listed, but only abnormal results are displayed) Labs Reviewed  CBG MONITORING, ED - Abnormal; Notable for the following components:      Result Value   Glucose-Capillary 68 (*)  All other components within normal limits  CBC  URINALYSIS, ROUTINE W REFLEX MICROSCOPIC  BASIC METABOLIC PANEL    EKG None  Radiology DG  Lumbar Spine Complete  Result Date: 09/13/2019 CLINICAL DATA:  Low back pain post fall, BILATERAL buttock pain radiating down posterior aspect of both legs, tailbone pain EXAM: LUMBAR SPINE - COMPLETE 4+ VIEW COMPARISON:  None FINDINGS: Five non-rib-bearing lumbar vertebra. Osseous mineralization normal for technique. Facet degenerative changes L4-L5 and L5-S1. Vertebral body and disc space heights maintained. Minimal anterolisthesis L4-L5. No fracture, additional subluxation, or bone destruction. No spondylolysis. SI joints preserved. IMPRESSION: Facet degenerative changes lower lumbar spine with minimal anterolisthesis L4-L5. No acute abnormalities. Electronically Signed   By: Lavonia Dana M.D.   On: 09/13/2019 16:33   CT HEAD WO CONTRAST  Result Date: 09/15/2019 CLINICAL DATA:  Nausea after fall. EXAM: CT HEAD WITHOUT CONTRAST TECHNIQUE: Contiguous axial images were obtained from the base of the skull through the vertex without intravenous contrast. COMPARISON:  None. FINDINGS: Brain: No evidence of acute infarction, hemorrhage, hydrocephalus, extra-axial collection or mass lesion/mass effect. Vascular: No hyperdense vessel or unexpected calcification. Skull: Normal. Negative for fracture or focal lesion. Sinuses/Orbits: No acute finding. Other: None. IMPRESSION: Normal head CT. Electronically Signed   By: Marijo Conception M.D.   On: 09/15/2019 10:35   DG Hips Bilat W or Wo Pelvis 3-4 Views  Result Date: 09/13/2019 CLINICAL DATA:  Low back pain post fall, BILATERAL buttock pain radiating down posterior aspect of both legs, tailbone pain EXAM: DG HIP (WITH OR WITHOUT PELVIS) 3-4V BILAT COMPARISON:  None. FINDINGS: Hip and SI joint spaces preserved. Osseous mineralization normal. No fracture, dislocation, or bone destruction. Degenerative facet disease changes at visualized lower lumbar spine. IMPRESSION: Negative radiographs of the hips. Electronically Signed   By: Lavonia Dana M.D.   On: 09/13/2019 16:36      Procedures Procedures (including critical care time)  Medications Ordered in ED Medications  sodium chloride flush (NS) 0.9 % injection 3 mL (has no administration in time range)    ED Course  I have reviewed the triage vital signs and the nursing notes.  Pertinent labs & imaging results that were available during my care of the patient were reviewed by me and considered in my medical decision making (see chart for details).    MDM Rules/Calculators/A&P                         Teva Bronkema is a 64 y.o. female with past medical history of hyperlipidemia, hypertension, OSA, type 2 diabetes, chronic kidney disease stage II that presents emergency department today for weakness and nausea. Not currently vertiginous, normal neuro exam.  Back exam unchanged from two days ago, myalgias to R lower back.  Patient is nontoxic appearing, vitals are WNL. Patient has normal neurologic exam, no point/focal midline tenderness to palpation. Ambulatory in the ED.  No back pain red flags(Denies numbness, tingling, weakness, saddle anesthesia, incontinence to bowel/bladder, fever, chills, IV drug use, dysuria, or hx of cancer. Patient has not had prior back surgeries).  CT head without any acute intracranial abnormalities, no need for MRI at this time.  Did discuss this with Dr.Dysktra.   I think patient is having symptoms because of her Robaxin, specifically nausea, due to this patient has not been eating and taking diabetes medication, Victoza, and causing her to become hypoglycemic.  Sugar 68 today.  Will have patient eat and drink ginger  ale and reassess.  This could be causing her to feel weak and nauseous as well.  CBC and CMP without any abnormalities, urinalysis negative.  Repeat CBG is 95. EkG without any signs of ischemia or arrhythmia.   Doubt need for further emergent work up at this time. I explained the diagnosis and have given explicit precautions to return to the ER including for any  other new or worsening symptoms. The patient understands and accepts the medical plan as it's been dictated and I have answered their questions. Discharge instructions concerning home care and prescriptions have been given. The patient is STABLE and is discharged to home in good condition.  I discussed this case with my attending physician who cosigned this note including patient's presenting symptoms, physical exam, and planned diagnostics and interventions. Attending physician stated agreement with plan or made changes to plan which were implemented.  Attending physician did see patient at bedside.   Final Clinical Impression(s) / ED Diagnoses Final diagnoses:  Weakness    Rx / DC Orders ED Discharge Orders         Ordered    lidocaine (LIDODERM) 5 %  Every 24 hours     Discontinue  Reprint     09/15/19 1134           Alfredia Client, PA-C 09/15/19 1335    Lucrezia Starch, MD 09/17/19 [phone]

## 2019-09-15 NOTE — ED Triage Notes (Signed)
Patient reports that since her recent fall on Sunday she has been feeling strange. Patient reports she feels nauseated and weak. Patient denies abdominal pain, denies blood in urine, and denies chest pain or SOB. Patient reports she has never had nausea this bad before.

## 2019-09-15 NOTE — ED Notes (Signed)
Pt discharged from this ED in stable condition at this time. All discharge instructions and follow up care reviewed with pt with no further questions at this time. Pt ambulatory with steady gait, clear speech.  

## 2019-09-15 NOTE — ED Notes (Signed)
Patient endorses some head pressure and states "I never have head pains" Patient reports she also has some arm pain and stiffness on the right side. No other concerning neuro symptoms.

## 2019-09-15 NOTE — Discharge Instructions (Addendum)
Your work-up was reassuring, I think that you are having your symptoms due to the side effects of Robaxin.  I need you to eat before you take your diabetes medications otherwise you will become hypoglycemic again.  You need to follow-up with your primary care in the next couple of days.  Stop taking the Robaxin, you can take the Lidoderm patches as needed for your muscle spasms instead.  I want you to keep an eye on your blood sugars.  If you start having any new or worsening concerning symptoms please come back to the emergency department. I hope you feel better!

## 2019-09-15 NOTE — ED Notes (Signed)
Pt took home meds- for HTN, hyperlipidemia and vitamins

## 2019-09-16 ENCOUNTER — Telehealth: Payer: Self-pay | Admitting: *Deleted

## 2019-09-16 DIAGNOSIS — R531 Weakness: Secondary | ICD-10-CM

## 2019-09-16 NOTE — Telephone Encounter (Signed)
Transitions of Care: Pt with ED visits in >2 weeks; order placed for [facility] Coordination.  Lenor Coffin, RN, BSN, [facility] Patient [place] [phone]

## 2019-09-17 ENCOUNTER — Telehealth: Payer: Self-pay | Admitting: *Deleted

## 2019-09-17 NOTE — Telephone Encounter (Signed)
°  Care Management   Outreach Note  09/17/2019 Name: Janice Bodine MRN: [id number] DOB: 1956-01-12  Referred by: [name], MD Reason for referral : Care Coordination (COPD, depression)   Third unsuccessful telephone outreach was attempted today. The patient was referred to the case management team for assistance with care management and care coordination. The patient's primary care provider has been notified of our unsuccessful attempts to make or maintain contact with the patient. The care management team is pleased to engage with this patient at any time in the future should he/she be interested in assistance from the care management team.   Follow Up Plan: The care management team is available to follow up with the patient after provider conversation with the patient regarding recommendation for care management engagement and subsequent re-referral to the care management team.   [name], [name], [facility] RN Care Manager [phone]

## 2019-09-17 NOTE — Telephone Encounter (Signed)
  Care Management   Note  09/17/2019 Name: Kristen Frost MRN: [id number] DOB: 11-29-1955  Encounter opened in error.  [name], [name], [facility] RN Care Manager [phone]

## 2019-09-18 NOTE — Addendum Note (Signed)
Addended by: Marcelino Duster on: 09/18/2019 10:03 AM   Modules accepted: Orders

## 2019-09-21 ENCOUNTER — Ambulatory Visit: Payer: Medicaid Other | Admitting: *Deleted

## 2019-09-21 ENCOUNTER — Telehealth: Payer: Medicaid Other

## 2019-09-21 DIAGNOSIS — E119 Type 2 diabetes mellitus without complications: Secondary | ICD-10-CM

## 2019-09-21 DIAGNOSIS — I10 Essential (primary) hypertension: Secondary | ICD-10-CM

## 2019-09-21 DIAGNOSIS — N182 Chronic kidney disease, stage 2 (mild): Secondary | ICD-10-CM

## 2019-09-21 NOTE — Chronic Care Management (AMB) (Signed)
  Care Management   Note  09/21/2019 Name: Kristen Frost MRN: [id number] DOB: Jun 03, 1955  Successful outreach to patient at home number. Explained chronic care management program for [facility] patients, she agrees to participate.  Telephone follow up appointment with care management team member scheduled for:09/23/19 at 12:00 noon per patient request.  [name], [name], [facility] RN Care Manager [phone]

## 2019-09-22 ENCOUNTER — Ambulatory Visit (INDEPENDENT_AMBULATORY_CARE_PROVIDER_SITE_OTHER): Payer: Medicaid Other | Admitting: Student

## 2019-09-22 ENCOUNTER — Other Ambulatory Visit: Payer: Self-pay

## 2019-09-22 ENCOUNTER — Encounter: Payer: Self-pay | Admitting: Student

## 2019-09-22 VITALS — BP 129/67 | HR 74 | Temp 98.0°F | Ht 61.5 in | Wt 245.9 lb

## 2019-09-22 DIAGNOSIS — E1122 Type 2 diabetes mellitus with diabetic chronic kidney disease: Secondary | ICD-10-CM

## 2019-09-22 DIAGNOSIS — M549 Dorsalgia, unspecified: Secondary | ICD-10-CM | POA: Diagnosis not present

## 2019-09-22 DIAGNOSIS — E119 Type 2 diabetes mellitus without complications: Secondary | ICD-10-CM | POA: Diagnosis not present

## 2019-09-22 DIAGNOSIS — G4733 Obstructive sleep apnea (adult) (pediatric): Secondary | ICD-10-CM

## 2019-09-22 DIAGNOSIS — I10 Essential (primary) hypertension: Secondary | ICD-10-CM

## 2019-09-22 DIAGNOSIS — E785 Hyperlipidemia, unspecified: Secondary | ICD-10-CM | POA: Diagnosis not present

## 2019-09-22 DIAGNOSIS — I129 Hypertensive chronic kidney disease with stage 1 through stage 4 chronic kidney disease, or unspecified chronic kidney disease: Secondary | ICD-10-CM | POA: Diagnosis not present

## 2019-09-22 DIAGNOSIS — N182 Chronic kidney disease, stage 2 (mild): Secondary | ICD-10-CM | POA: Diagnosis not present

## 2019-09-22 DIAGNOSIS — Z0000 Encounter for general adult medical examination without abnormal findings: Secondary | ICD-10-CM

## 2019-09-22 LAB — GLUCOSE, CAPILLARY: Glucose-Capillary: 75 mg/dL (ref 70–99)

## 2019-09-22 LAB — POCT GLYCOSYLATED HEMOGLOBIN (HGB A1C): Hemoglobin A1C: 6 % — AB (ref 4.0–5.6)

## 2019-09-22 NOTE — Assessment & Plan Note (Addendum)
Will repeat Lipid panel at next appointment. Continue lipitor 40 mg daily.

## 2019-09-22 NOTE — Patient Instructions (Addendum)
Thank you for allowing [place] [facility] to assist in your care!   Today we talked about:   1. Your Recent Fall/ED Visit - Please continue to use warm compresses, hot showers, and your muscle stick. If you can, a heating pad may also help. Continue to take the medications prescribed from the emergency department, the lidocaine patches and robaxin. If your pain does not improve, please call the clinic and we can change you to an oral or topical NSAID (anti-inflammatory). If you have any loss of bowel/bladder function, new numbness/tingling, please go to your closest emergency department.   2. Diabetes Mellitus Type 2 - Your last A1C was 5.9. We will repeat it today and if it is below 6, we will decrease your metformin. Please continue to take your victoza. Keep up the good work on your weight loss journey!  -We will repeat your other labs in September  3. Sleep Apnea - Continue using your CPAP nightly. Good job on using it nightly, keep up the good work!   4. Hypertension (High Blood Pressure) - Your blood pressure today was 129/67 and is in good range.  5. Chronic Kidney Disease Stage 2  - Your kidney function is doing well, we will reassess in September.   6. Gyn Appointment - Your gyn appointment is scheduled, please keep that appointment.

## 2019-09-22 NOTE — Assessment & Plan Note (Signed)
Patient with history of CKD stage 2. GFR over 60 on 09/15/19. Denies any symptoms that would indicate uremia.

## 2019-09-22 NOTE — Assessment & Plan Note (Addendum)
Patient fell on 09/13/19 and was seen in the ED. Diagnosed with back strain. Suspected strain of the trapezius muscle on the left side. Patient states it is improving with conservative treatment of muscle stick, warm compresses, and hot showers. Was written for robaxin and lidocaine patches, however, patient notes she does not like taking medicine.   A/P -Pain improving, encouraged continue use of conservative treatment and using medications prescribed by the ED -If patient's pain has not improved in the next few days or medications are not helping, instructed patient to call clinic and we will add a topical NSAID or oral NSAID. Patient's CKD is stable and GFR over 60. Will consider prescribing naproxen 500 BID with meals for 14 days.

## 2019-09-22 NOTE — Progress Notes (Signed)
CC: New PCP Appointment, [place] ED Visit Follow Up - Fall  HPI:   Ms.Makella Drye Budden is a 64 y.o. F with a PMHx of HTN, T2DM Controlled, CKD Stage 2, HLD, and OSA who presents to the clinic today for a new PCP appointment as well as post ED follow up.   Ieasha fell after walking in a puddle of water, she fell backwards and landed on her back. She denies hitting her head or losing consciousness. She was seen in the ED and diagnosed with lumbar strain and given robaxin. She began taking the robaxin but started feeling nauseated and had a decrease in appetite. She presented to the ED again for weakness and nausea. She was diagnosed with hypoglycemia and the ED suspected the robaxin was causing the patient to be nauseated causing a decrease in appetite and hypoglycemia. They wrote her for lidocaine patches, but she states she does not like taking more medicines so she has been hesitant to use the patches. She has used warm compresses, hot showers, and used a massage stick that has improved her back pain. She notes her right knee pain and shoulder pain are improving as well. She denies any numbness/tingling in her legs, or episodes of loss of bowel/bladder function. She did express feeling "shook" after the fall, but states that has improved as well and she meets with [name] for her mental health.   Gearlene has also been working on losing weight and exercising more, she has lost 16 lbs in the past year. She joined [employer] and states that has helped and motivated her. Her goal is to decrease as many medicines as possible, especially the metformin. She states she is self motivated, with a good support system, and her episode of falling only heightened this self motivation.  She has no other concerns or complaints at the time of her visit. She denies any vomiting, decrease appetite, fatigue, or cramping.     Past Medical History:  Diagnosis Date   Anxiety    Arthritis    Bleeding  hemorrhoids    grade 2   Diverticulosis of colon    Full dentures    History of colon polyps    Hyperlipidemia    Hypertension    followed by pcp   (04-30-2019  per pt had stress test approx. 2005, was told normal (not available in epic)   IDA (iron deficiency anemia)    OSA on CPAP    followed by dr Halford Chessman ([facility])    (04-30-2019  pt  stated uses every night)   Restless leg syndrome    Type 2 diabetes mellitus ([place])    followed by pcp   (04-30-2019  pt stated does not check blood sugar at home)   Review of Systems:  Review of Systems  Cardiovascular: Negative for leg swelling.  Gastrointestinal: Positive for nausea. Negative for diarrhea and vomiting.  Genitourinary: Negative for dysuria.       Denies loss of bowel/bladder function  Musculoskeletal: Positive for back pain.  Neurological: Negative for tingling and focal weakness.       Denies numbness  All other systems reviewed and are negative.   Vitals:   09/22/19 0854  BP: 129/67  Pulse: 74  Temp: 98 F (36.7 C)  TempSrc: Oral  SpO2: 100%  Weight: 245 lb 14.4 oz (111.5 kg)  Height: 5' 1.5" (1.562 m)   Physical Exam Vitals and nursing note reviewed.  Constitutional:      General:  She is not in acute distress.    Appearance: Normal appearance. She is not ill-appearing, toxic-appearing or diaphoretic.  HENT:     Head: Normocephalic and atraumatic.  Cardiovascular:     Rate and Rhythm: Normal rate and regular rhythm.     Pulses: Normal pulses.     Heart sounds: No murmur heard.  No gallop.   Musculoskeletal:        General: Tenderness present. Normal range of motion.     Cervical back: Normal range of motion and neck supple. No rigidity or tenderness.     Right lower leg: No edema.     Left lower leg: No edema.     Comments: Tender over left trapezius muscle  Skin:    General: Skin is warm and dry.  Neurological:     General: No focal deficit present.     Mental Status: She is alert and oriented  to person, place, and time. Mental status is at baseline.  Psychiatric:        Mood and Affect: Mood normal.        Behavior: Behavior normal.    Assessment & Plan:   See Encounters Tab for problem based charting.  Patient discussed and seen by Dr. Philipp Ovens

## 2019-09-22 NOTE — Assessment & Plan Note (Signed)
Kristen Frost is with well controlled type II DM. She is on metformin 1000 mg once daily and victoza 1.8 mg QD. Tolerating well, however would like to decrease her metformin if possible. A1C today of 6.0. Discussed with the patient we will recheck in 3-6 months and if A1C under 6, will consider decreasing metformin to 500 mg once daily.   A/P:  Well Controlled Continue current regiment of metformin 1000 mg once daily and victoza 1.8 mg QD

## 2019-09-22 NOTE — Assessment & Plan Note (Addendum)
Blood pressure today of 129/67. She is currently on amlodipine 10 mg once daily. She is taking her medications as prescribed.   A/P  - She has lost 16 lbs in the past year and is continuing to lose weight through weight watchers.  - Do not believe patient needs an additional medication at this time. Will continue to monitor and adjust medications as needed.

## 2019-09-22 NOTE — Assessment & Plan Note (Addendum)
A/P She is compliant and tolerates her CPAP well. No complaints.

## 2019-09-22 NOTE — Assessment & Plan Note (Addendum)
Patient denied COVID vaccine and did not want to discuss further.   She would like to talk about the tdap next appointment  Her Pap Smear is scheduled for September with Gyn.

## 2019-09-23 ENCOUNTER — Ambulatory Visit: Payer: Medicaid Other | Admitting: *Deleted

## 2019-09-23 DIAGNOSIS — I10 Essential (primary) hypertension: Secondary | ICD-10-CM

## 2019-09-23 DIAGNOSIS — E119 Type 2 diabetes mellitus without complications: Secondary | ICD-10-CM

## 2019-09-23 DIAGNOSIS — N182 Chronic kidney disease, stage 2 (mild): Secondary | ICD-10-CM

## 2019-09-23 DIAGNOSIS — E785 Hyperlipidemia, unspecified: Secondary | ICD-10-CM

## 2019-09-23 NOTE — Chronic Care Management (AMB) (Addendum)
Care Management   Initial Visit Note  09/23/2019 Name: Kristen Frost MRN: [id number] DOB: December 05, 1955  Subjective:   Objective:  Assessment: Kristen Frost is a 64 y.o. year old female who sees Kristen Pope, MD for primary care. The care management team was consulted for assistance with care management and care coordination needs related to Disease Management, Educational [facility] Coordination.   Review of patient status, including review of consultants reports, relevant laboratory and other test results, and collaboration with appropriate care team members and the patient's provider was performed as part of comprehensive patient evaluation and provision of care management services.    SDOH (Social Determinants of Health) assessments performed: Yes See Care Plan activities for detailed interventions related to SDOH)  SDOH Interventions     Most Recent Value  SDOH Interventions  Financial Strain Interventions Intervention Not Indicated  Housing Interventions Intervention Not Indicated  Intimate Partner Violence Interventions Intervention Not Indicated  Physical Activity Interventions Intervention Not Indicated  Stress Interventions Intervention Not Indicated  Social Connections Interventions Intervention Not Indicated  Transportation Interventions Intervention Not Indicated       Outpatient Encounter Medications as of 09/23/2019  Medication Sig  . amLODipine (NORVASC) 10 MG tablet TAKE 1 TABLET BY MOUTH DAILY (Patient taking differently: Take 10 mg by mouth daily. TAKE 1 TABLET BY MOUTH DAILY)  . aspirin EC 81 MG tablet Take 81 mg by mouth daily.  Marland Kitchen atorvastatin (LIPITOR) 40 MG tablet Take 1 tablet (40 mg total) by mouth daily.  . Cholecalciferol (VITAMIN D) 2000 units CAPS Take 1 capsule (2,000 Units total) by mouth daily. (Patient taking differently: Take 2,000 Units by mouth daily. )  . ferrous sulfate 325 (65 FE) MG tablet Take 1 tablet (325 mg total) by  mouth daily with breakfast. (Patient taking differently: Take 325 mg by mouth daily with breakfast. )  . fluticasone (FLONASE) 50 MCG/ACT nasal spray Place 1 spray into both nostrils daily. (Patient taking differently: Place 1 spray into both nostrils daily as needed. )  . Insulin Pen Needle (PEN NEEDLES) 31G X 5 MM MISC 1 application by Does not apply route daily.  Marland Kitchen lidocaine (LIDODERM) 5 % Place 1 patch onto the skin daily. Remove & Discard patch within 12 hours or as directed by MD  . liraglutide (VICTOZA) 18 MG/3ML SOPN Inject 0.3 mLs (1.8 mg total) into the skin daily.  . metFORMIN (GLUCOPHAGE-XR) 500 MG 24 hr tablet Take 2 tablets (1,000 mg total) by mouth daily with breakfast. (Patient taking differently: Take 1,000 mg by mouth daily with breakfast. )  . methocarbamol (ROBAXIN) 500 MG tablet Take 1 tablet (500 mg total) by mouth 2 (two) times daily.  Marland Kitchen oxyCODONE (OXY IR/ROXICODONE) 5 MG immediate release tablet Take 1-2 tablets (5-10 mg total) by mouth every 6 (six) hours as needed.   No facility-administered encounter medications on file as of 09/23/2019.    Goals Addressed              This Visit's Progress     Patient Stated   .  "I want to continue to lose weight and maintain it and keep my blood sugar in good control." (pt-stated)        CARE PLAN ENTRY (see longitudinal plan of care for additional care plan information)  Current Barriers:  . Chronic Disease Management support and education needs related to DM, HTN, HLD CKD and obesity- spoke with patient to complete initial assessment, she says she is losing weight via [employer], she says she doesn't weigh often but rather goes by how her clothes fit and how she feels while losing weight,  she says she is currently unable to exercise due to a recent fall resulting in a  back injury , she says she does not check her blood sugar or blood pressure at home, is receptive to home BP monitor and states she could use a new  medication box  Nurse Case Manager Clinical Goal(s): Marland Kitchen Over the next 30-60 days, the patient will demonstrate ongoing self health care management ability as evidenced by meeting targets for Hgb A1C and Blood pressure and ongoing weight loss or no weight gain  Interventions:  . Inter-disciplinary care team collaboration (see longitudinal plan of care) . Evaluation of current treatment plan related to HTN, DM and obesity and patient's adherence to plan as established by provider. . Provided education to patient re: obtaining BP monitor for home use and how to monitor BP at home . Reviewed medications with patient and discussed medication taking behavior and mailed new medication box to patient's home address  . Collaborated with provider regarding prescription for home BP monitor . Discussed plans with patient for ongoing care management follow up and provided patient with direct contact information for care management team . Advised patient, providing education and rationale, to monitor blood pressure daily and record, calling clinic provider for findings outside established parameters.   Patient Self Care Activities:  . Patient verbalizes understanding of plan to work with [name] RN to improve chronic disease self management skills . Self administers medications as prescribed . Attends all scheduled provider appointments . Calls pharmacy for medication refills . Performs ADL's independently . Performs IADL's independently . Calls provider office for new concerns or questions . Unable to independently obtain home BP monitor  Initial goal documentation         Follow up plan:  The care management team will reach out to the patient again over the next 30-60 days.   Ms. Bluett was given information about Care Management services today including:  1. Care Management services include personalized support from designated clinical staff supervised by a physician, including individualized plan  of care and coordination with other care providers 2. 24/7 contact phone numbers for assistance for urgent and routine care needs. 3. The patient may stop Care Management services at any time (effective at the end of the month) by phone call to the office staff.  Patient agreed to services and verbal consent obtained.  [name], [name], [facility] RN Care Manager [phone]

## 2019-09-23 NOTE — Patient Instructions (Signed)
Visit Information It was very nice speaking with you. I will contact you about the home BP monitor.  Goals Addressed              This Visit's Progress     Patient Stated   .  "I want to continue to lose weight and maintain it and keep my blood sugar in good control." (pt-stated)        CARE PLAN ENTRY (see longitudinal plan of care for additional care plan information)  Current Barriers:  . Chronic Disease Management support and education needs related to DM, HTN, HLD CKD and obesity- spoke with patient to complete initial assessment, she says she is losing weight via [employer], she says she doesn't weight often but rather goes by how her clothes fit and how she feels while losing weight,  she says she is currently unable to exercise due to a recent fall resulting gin back injury , she says she does not check her blood sugar or blood pressure at home, is receptive to home BP monitor and states she could use a new medication box  Nurse Case Manager Clinical Goal(s): Marland Kitchen Over the next 30-60 days, the patient will demonstrate ongoing self health care management ability as evidenced by meeting targets for Hgb A1C and Blood pressure and ongoing weight loss or no weight gain  Interventions:  . Inter-disciplinary care team collaboration (see longitudinal plan of care) . Evaluation of current treatment plan related to HTN, DM and obesity and patient's adherence to plan as established by provider. . Provided education to patient re: obtaining BP monitor for home use and how to monitor BP at home . Reviewed medications with patient and discussed medication taking behavior and mailed new medication box to patient's home address  . Collaborated with provider regarding prescription for home BP monitor . Discussed plans with patient for ongoing care management follow up and provided patient with direct contact information for care management team . Advised patient, providing education and  rationale, to monitor blood pressure daily and record, calling clinic provider for findings outside established parameters.   Patient Self Care Activities:  . Patient verbalizes understanding of plan to work with [name] RN to improve chronic disease self management skills . Self administers medications as prescribed . Attends all scheduled provider appointments . Calls pharmacy for medication refills . Performs ADL's independently . Performs IADL's independently . Calls provider office for new concerns or questions . Unable to independently obtain home BP monitor  Initial goal documentation        Kristen Frost was given information about Care Management services today including:  1. Care Management services include personalized support from designated clinical staff supervised by her physician, including individualized plan of care and coordination with other care providers 2. 24/7 contact phone numbers for assistance for urgent and routine care needs. 3. The patient may stop [name] services at any time (effective at the end of the month) by phone call to the office staff.  Patient agreed to services and verbal consent obtained.   The patient verbalized understanding of instructions provided today and declined a print copy of patient instruction materials.   The care management team will reach out to the patient again over the next 30-60 days.   [name], [name], [facility] RN Care Manager [phone]

## 2019-09-24 ENCOUNTER — Ambulatory Visit: Payer: Medicaid Other | Admitting: *Deleted

## 2019-09-24 DIAGNOSIS — E785 Hyperlipidemia, unspecified: Secondary | ICD-10-CM

## 2019-09-24 DIAGNOSIS — N182 Chronic kidney disease, stage 2 (mild): Secondary | ICD-10-CM

## 2019-09-24 DIAGNOSIS — I10 Essential (primary) hypertension: Secondary | ICD-10-CM

## 2019-09-24 DIAGNOSIS — E119 Type 2 diabetes mellitus without complications: Secondary | ICD-10-CM

## 2019-09-24 NOTE — Progress Notes (Signed)
Internal Medicine Clinic Attending  I saw and evaluated the patient.  I personally confirmed the key portions of the history and exam documented by Dr. [name] and I reviewed pertinent patient test results.  The assessment, diagnosis, and plan were formulated together and I agree with the documentation in the resident's note.  

## 2019-09-24 NOTE — Progress Notes (Signed)
Internal Medicine Clinic Resident  I have personally reviewed this encounter including the documentation in this note and/or discussed this patient with the care management provider. Placed DME order for home blood pressure monitor for HTN ICD 10 Code with orders to check BP 2x/day and to notify provider if SBP >180 and/or DBP >120. I will address any urgent items identified by the care management provider and will communicate my actions to the patient's PCP. I have reviewed the patient's [name] visit with my supervising attending, Dr Daryll Drown.  Virl Axe, MD 09/24/2019

## 2019-09-24 NOTE — Chronic Care Management (AMB) (Signed)
Care Management   Follow Up Note   09/24/2019 Name: Kristen Frost MRN: [id number] DOB: 1956-01-19  Referred by: [name], MD Reason for referral : Care Coordination (HTN, HLD, CKD, DM)   Kristen Frost is a 64 y.o. year old female who is a primary care patient of [name], [name], MD. The care management team was consulted for assistance with care management and care coordination needs.    Review of patient status, including review of consultants reports, relevant laboratory and other test results, and collaboration with appropriate care team members and the patient's provider was performed as part of comprehensive patient evaluation and provision of chronic care management services.    SDOH (Social Determinants of Health) assessments performed: No See Care Plan activities for detailed interventions related to [facility])     Advanced Directives: See Care Plan and Vyn[employer] application for related entries.   Goals Addressed              This Visit's Progress     Patient Stated   .  "I want to continue to lose weight and maintain it and keep my blood sugar in good control." (pt-stated)        CARE PLAN ENTRY (see longitudinal plan of care for additional care plan information)  Current Barriers:  . Chronic Disease Management support and education needs related to DM, HTN, HLD CKD and obesity- spoke with patient to complete initial assessment, she says she is losing weight via [employer], she says she doesn't weight often but rather goes by how her clothes fit and how she feels while losing weight,  she says she is currently unable to exercise due to a recent fall resulting gin back injury , she says she does not check her blood sugar or blood pressure at home, is receptive to home BP monitor and states she could use a new medication box  Nurse Case Manager Clinical Goal(s): Marland Kitchen Over the next 30-60 days, the patient will demonstrate ongoing self health care  management ability as evidenced by meeting targets for Hgb A1C and Blood pressure and ongoing weight loss or no weight gain  Interventions:  . Inter-disciplinary care team collaboration (see longitudinal plan of care) . Evaluation of current treatment plan related to HTN, DM and obesity and patient's adherence to plan as established by provider. . Provided education to patient re: obtaining BP monitor for home use and how to monitor BP at home- mailed [place] education articles and Blood Pressure Control booklet and [place] Management Exercise Booklet and [place] Management  spiral bound calendar with HTN and DM education to patient on 09/24/19 . Reviewed medications with patient and discussed medication taking behavior and mailed new medication box to patient's home address- mailed medication box to patient on 09/24/19  . Collaborated with provider regarding prescription for home BP monitor . 09/24/19 Faxed order for home blood pressure monitor to [facility] . Discussed plans with patient for ongoing care management follow up and provided patient with direct contact information for care management team . Advised patient, providing education and rationale, to monitor blood pressure daily and record, calling clinic provider for findings outside established parameters.   Patient Self Care Activities:  . Patient verbalizes understanding of plan to work with [name] RN to improve chronic disease self management skills . Self administers medications as prescribed . Attends all scheduled provider appointments . Calls pharmacy for medication refills . Performs ADL's independently . Performs IADL's independently .  Calls provider office for new concerns or questions . Unable to independently obtain home BP monitor  Initial goal documentation         The care management team will reach out to the patient again over the next 30-60 days.   [name], [name],  [facility] RN Care Manager [phone]

## 2019-09-24 NOTE — Addendum Note (Signed)
Addended by: Virl Axe on: 09/24/2019 08:48 AM   Modules accepted: Orders

## 2019-09-24 NOTE — Progress Notes (Signed)
Internal Medicine Clinic Resident  I have personally reviewed this encounter including the documentation in this note and/or discussed this patient with the care management provider. I will address any urgent items identified by the care management provider and will communicate my actions to the patient's PCP. I have reviewed the patient's [name] visit with my supervising attending, Dr Daryll Drown.  Asencion Noble, MD 09/24/2019

## 2019-09-30 ENCOUNTER — Other Ambulatory Visit: Payer: Self-pay

## 2019-09-30 ENCOUNTER — Ambulatory Visit (INDEPENDENT_AMBULATORY_CARE_PROVIDER_SITE_OTHER): Payer: Medicaid Other | Admitting: Licensed Clinical Social Worker

## 2019-09-30 ENCOUNTER — Encounter: Payer: Self-pay | Admitting: Licensed Clinical Social Worker

## 2019-09-30 DIAGNOSIS — F419 Anxiety disorder, unspecified: Secondary | ICD-10-CM

## 2019-09-30 NOTE — BH Specialist Note (Signed)
Integrated Behavioral Health Visit via Telemedicine (Telephone)  09/30/2019 Haskell Riling [id number]   Session Start time: 8:48  Session End time: 9:25 Total time: 37 minutes  Referring Provider: Dr. Agustin Cree Type of Visit: Telephonic Patient location: Home [facility] Provider location: Remote All persons participating in visit: Patient and [facility] Confirmed patient's address: Yes  Confirmed patient's phone number: Yes  Any changes to demographics: No   The following statements were read to the patient and/or legal guardian that are established with the [facility] Provider.  "The purpose of this phone visit is to provide behavioral health care while limiting exposure to the coronavirus (COVID19).  There is a possibility of technology failure and discussed alternative modes of communication if that failure occurs."  Discussed confidentiality: Yes   "By engaging in this telephone visit, you consent to the provision of healthcare.  Additionally, you authorize for your insurance to be billed for the services provided during this telephone visit."   Patient and/or legal guardian consented to telephone visit: Yes   PRESENTING CONCERNS: Patient and/or family reports the following symptoms/concerns: anxiety, family stressors, sadness, and recent family dynamic changes.  Duration of problem: since 2010; Severity of problem: mild  STRENGTHS (Protective Factors/Coping Skills): Starting implementing healthy habits.   GOALS ADDRESSED: Patient will: 1.  Reduce symptoms of: anxiety and stress  2.  Increase knowledge and/or ability of: coping skills, healthy habits and stress reduction  3.  Demonstrate ability to: Increase healthy adjustment to current life circumstances, Increase adequate support systems for patient/family and Increase motivation to adhere to plan of care  INTERVENTIONS: Interventions utilized:  Motivational Interviewing, Brief CBT and Supportive  Counseling Standardized Assessments completed: Not Needed  ASSESSMENT: Patient currently experiencing mild anxiety. Patient reported that she is continuing to engage in [place], and is finding this program helpful. Patient has lost weight in a healthy way. Patient is hopeful to get off of her medications for insulin.   Patient is continuing to have stressors with her family system, and she is trying to gain full custody of her step-son.   Patient may benefit from counseling.  PLAN: 1. Follow up with behavioral health clinician on : two weeks.  2. Behavioral recommendations: Continue health habits.  [name], [facility], [place]

## 2019-09-30 NOTE — Progress Notes (Signed)
Internal Medicine Clinic Attending  [name] services provided by the care management provider and their documentation were discussed with Dr. Allyson Sabal. We reviewed the pertinent findings, urgent action items addressed by the resident and non-urgent items to be addressed by the PCP.  I agree with the assessment, diagnosis, and plan of care documented in the [name] and resident's note.  Gilles Chiquito, MD 09/30/2019

## 2019-10-01 NOTE — Progress Notes (Signed)
Internal Medicine Clinic Attending  [name] services provided by the care management provider and their documentation were discussed with Dr. Sherry Ruffing. We reviewed the pertinent findings, urgent action items addressed by the resident and non-urgent items to be addressed by the PCP.  I agree with the assessment, diagnosis, and plan of care documented in the [name] and resident's note.  Gilles Chiquito, MD 10/01/2019

## 2019-10-06 DIAGNOSIS — G4733 Obstructive sleep apnea (adult) (pediatric): Secondary | ICD-10-CM | POA: Diagnosis not present

## 2019-10-14 ENCOUNTER — Encounter: Payer: Self-pay | Admitting: Licensed Clinical Social Worker

## 2019-10-14 ENCOUNTER — Other Ambulatory Visit: Payer: Self-pay

## 2019-10-14 ENCOUNTER — Ambulatory Visit (INDEPENDENT_AMBULATORY_CARE_PROVIDER_SITE_OTHER): Payer: Medicaid Other | Admitting: Licensed Clinical Social Worker

## 2019-10-14 DIAGNOSIS — F419 Anxiety disorder, unspecified: Secondary | ICD-10-CM

## 2019-10-14 NOTE — BH Specialist Note (Signed)
Integrated Behavioral Health Visit via Telemedicine (Telephone)  10/14/2019 Kristen Frost [id number]   Session Start time: 8:30  Session End time: 9:00 Total time: 30 minutes  Referring Provider: Dr. Agustin Cree Type of Visit: Telephonic Patient location: Home [facility] Provider location: Office All persons participating in visit: [facility] and patient  Confirmed patient's address: Yes  Confirmed patient's phone number: Yes  Any changes to demographics: No   The following statements were read to the patient and/or legal guardian that are established with the [facility] Provider.  "The purpose of this phone visit is to provide behavioral health care while limiting exposure to the coronavirus (COVID19).  There is a possibility of technology failure and discussed alternative modes of communication if that failure occurs."  Discussed confidentiality: Yes   "By engaging in this telephone visit, you consent to the provision of healthcare.  Additionally, you authorize for your insurance to be billed for the services provided during this telephone visit."   Patient and/or legal guardian consented to telephone visit: Yes   PRESENTING CONCERNS: Patient and/or family reports the following symptoms/concerns: anxiety, family stressors, sadness, and recent family dynamic changes. Duration of problem: since 2010; Severity of problem: mild-moderate  GOALS ADDRESSED: Patient will: 1.  Reduce symptoms of: anxiety and stress  2.  Increase knowledge and/or ability of: coping skills, healthy habits and stress reduction  3.  Demonstrate ability to: Increase healthy adjustment to current life circumstances and Increase adequate support systems for patient/family  INTERVENTIONS: Interventions utilized:  Mindfulness or Relaxation Training, Brief CBT and Supportive Counseling Standardized Assessments completed: Not Needed  ASSESSMENT: Patient currently experiencing mild to moderate levels of  anxiety due to ongoing family stressors. Patient processed recent triggers that have elevated her stress and anxiety levels. Patient is continuing to try to work on exercising and eating healthy to reduce her stress overall. Patient reported feeling overwhelmed, and processed ways that she can remove herself from environmental stressors.   Patient may benefit from counseling.  PLAN: Follow up with behavioral health clinician on : two weeks. [name], [facility], [place]

## 2019-10-20 ENCOUNTER — Other Ambulatory Visit: Payer: Medicaid Other

## 2019-10-21 ENCOUNTER — Ambulatory Visit: Payer: Medicaid Other | Admitting: *Deleted

## 2019-10-21 DIAGNOSIS — E785 Hyperlipidemia, unspecified: Secondary | ICD-10-CM

## 2019-10-21 DIAGNOSIS — N182 Chronic kidney disease, stage 2 (mild): Secondary | ICD-10-CM

## 2019-10-21 DIAGNOSIS — E119 Type 2 diabetes mellitus without complications: Secondary | ICD-10-CM

## 2019-10-21 DIAGNOSIS — I10 Essential (primary) hypertension: Secondary | ICD-10-CM

## 2019-10-21 NOTE — Chronic Care Management (AMB) (Signed)
Care Management   Follow Up Note   10/21/2019 Name: Kristen Frost MRN: [id number] DOB: 01/09/1956  Referred by: [name], MD Reason for referral : Care Coordination (HTN, NIDDM, HLD, CKD)   Kristen Frost is a 64 y.o. year old female who is a primary care patient of [name], [name], MD. The care management team was consulted for assistance with care management and care coordination needs.    Review of patient status, including review of consultants reports, relevant laboratory and other test results, and collaboration with appropriate care team members and the patient's provider was performed as part of comprehensive patient evaluation and provision of chronic care management services.    SDOH (Social Determinants of Health) assessments performed: No See Care Plan activities for detailed interventions related to [facility])     Advanced Directives: See Care Plan and Vynca application for related entries.  Lab Results  Component Value Date   CHOL 140 02/28/2017   HDL 52 02/28/2017   LDLCALC 71 02/28/2017   TRIG 85 02/28/2017   CHOLHDL 2.7 02/28/2017  please update lipid panel- patient has DM and is on statin therapy Lab Results  Component Value Date   HGBA1C 6.0 (A) 09/22/2019    Goals Addressed              This Visit's Progress     Patient Stated   .  "I want to continue to lose weight and maintain it and keep my blood sugar in good control." (pt-stated)        CARE PLAN ENTRY (see longitudinal plan of care for additional care plan information)  Current Barriers:  . Chronic Disease Management support and education needs related to DM, HTN, HLD CKD and obesity- spoke with patient to complete follow up assessment, she says she continues to lose weight via [employer] and participates in face to face weigh in every week, she says she does not check her blood sugar or blood pressure at home, she says she di not receive a home blood pressure  monitor or a call from [employer] (patient's health insurance in-network mail order medical supply company) even though this [name] RN faxed the order and demographic sheet to [place] [facility] on 09/24/19, she voiced appreciation for the medication box and HTN and exercise educational information that was mailed to her last month, she reports good medication taking behavior  Nurse Case Manager Clinical Goal(s): Marland Kitchen Over the next 30-60 days, the patient will demonstrate ongoing self health care management ability as evidenced by meeting targets for Hgb A1C and Blood pressure and ongoing weight loss or no weight gain  Interventions:  . Inter-disciplinary care team collaboration (see longitudinal plan of care) . Evaluation of current treatment plan related to HTN, DM and obesity and patient's adherence to plan as established by provider. . Provided opportunity for patient to ask questions regarding educational materials mailed to her last month  . Reviewed medications with patient and discussed medication taking behavior  . Advised patient his [name] RN will follow up with [employer] re: home BP monitor . Called [employer] at [phone], spoke with Shirlee Limerick, registered account for patient and placed order for home BP monitor.   . Discussed plans with patient for ongoing care management follow up and provided patient with direct contact information for care management team . Advised patient, providing education and rationale, to monitor blood pressure daily and record once she receives the home monitor, calling clinic provider for findings outside established parameters.   Patient  Self Care Activities:  . Patient verbalizes understanding of plan to work with [name] RN to improve chronic disease self management skills . Self administers medications as prescribed . Attends all scheduled provider appointments . Calls pharmacy for medication refills . Performs ADL's independently . Performs IADL's independently . Calls provider  office for new concerns or questions . Unable to independently obtain home BP monitor  Please see past updates related to this goal by clicking on the "Past Updates" button in the selected goal          The care management team will reach out to the patient again over the next 30-60 days.   [name], [name], [facility] RN Care Manager [phone]

## 2019-10-21 NOTE — Patient Instructions (Signed)
Visit Information It was nice speaking with you today- please call me when you receive your home blood pressure monitor. Goals Addressed              This Visit's Progress     Patient Stated     "I want to continue to lose weight and maintain it and keep my blood sugar in good control." (pt-stated)        CARE PLAN ENTRY (see longitudinal plan of care for additional care plan information)  Current Barriers:   Chronic Disease Management support and education needs related to DM, HTN, HLD CKD and obesity- spoke with patient to complete follow up assessment, she says she continues to lose weight via Weight [employer] and participates in face to face weigh in every week, she says she does not check her blood sugar or blood pressure at home, she says she di not receive a home blood pressure monitor or a call from [employer] (patient's health insurance in-network mail order medical supply company) even though this [name] RN faxed the order and demographic sheet to [place] [facility] on 09/24/19, she voiced appreciation for the medication box and HTN and exercise educational information that was mailed to her last month, she reports good medication taking behavior  Nurse Case Manager Clinical Goal(s):  Over the next 30-60 days, the patient will demonstrate ongoing self health care management ability as evidenced by meeting targets for Hgb A1C and Blood pressure and ongoing weight loss or no weight gain  Interventions:   Inter-disciplinary care team collaboration (see longitudinal plan of care)  Evaluation of current treatment plan related to HTN, DM and obesity and patient's adherence to plan as established by provider.  Provided opportunity for patient to ask questions regarding educational materials mailed to her last month   Reviewed medications with patient and discussed medication taking behavior   Advised patient his [name] RN will follow up with [employer] re: home BP monitor  Called [facility] at  [phone], spoke with Shirlee Limerick, registered account for patient and placed order for home BP monitor.    Discussed plans with patient for ongoing care management follow up and provided patient with direct contact information for care management team  Advised patient, providing education and rationale, to monitor blood pressure daily and record once she receives the home monitor, calling clinic provider for findings outside established parameters.   Patient Self Care Activities:   Patient verbalizes understanding of plan to work with [name] RN to improve chronic disease self management skills  Self administers medications as prescribed  Attends all scheduled provider appointments  Calls pharmacy for medication refills  Performs ADL's independently  Performs IADL's independently  Calls provider office for new concerns or questions  Unable to independently obtain home BP monitor  Please see past updates related to this goal by clicking on the "Past Updates" button in the selected goal        Other     HEMOGLOBIN A1C < 7        Lab Results  Component Value Date   HGBA1C 6.0 (A) 09/22/2019   Meeting Hgb A1C target      LDL CALC < 100        Lab Results  Component Value Date   CHOL 140 02/28/2017   HDL 52 02/28/2017   LDLCALC 71 02/28/2017   TRIG 85 02/28/2017   CHOLHDL 2.7 02/28/2017          The patient verbalized understanding of instructions provided today and  declined a print copy of patient instruction materials.   The care management team will reach out to the patient again over the next 30-60 days.   [name], [name], [facility] RN Care Manager [phone]

## 2019-10-22 ENCOUNTER — Other Ambulatory Visit: Payer: Self-pay | Admitting: Internal Medicine

## 2019-10-22 DIAGNOSIS — E119 Type 2 diabetes mellitus without complications: Secondary | ICD-10-CM

## 2019-10-22 NOTE — Progress Notes (Signed)
Internal Medicine Clinic Resident  I have personally reviewed this encounter including the documentation in this note and/or discussed this patient with the care management provider. I will address any urgent items identified by the care management provider and will communicate my actions to the patient's PCP. I have reviewed the patient's [name] visit with my supervising attending, Dr Vincent.  Awa Bachicha K Nazaret Chea, MD 10/22/2019   

## 2019-10-22 NOTE — Progress Notes (Signed)
Internal Medicine Clinic Attending  [name] services provided by the care management provider and their documentation were discussed with Dr. Agyei. We reviewed the pertinent findings, urgent action items addressed by the resident and non-urgent items to be addressed by the PCP.  I agree with the assessment, diagnosis, and plan of care documented in the [name] and resident's note.  Io Dieujuste Thomas Kalven Ganim, MD 10/22/2019  

## 2019-10-23 DIAGNOSIS — I10 Essential (primary) hypertension: Secondary | ICD-10-CM | POA: Diagnosis not present

## 2019-10-27 ENCOUNTER — Ambulatory Visit: Payer: Medicaid Other | Admitting: *Deleted

## 2019-10-27 DIAGNOSIS — E119 Type 2 diabetes mellitus without complications: Secondary | ICD-10-CM

## 2019-10-27 DIAGNOSIS — N182 Chronic kidney disease, stage 2 (mild): Secondary | ICD-10-CM

## 2019-10-27 DIAGNOSIS — I10 Essential (primary) hypertension: Secondary | ICD-10-CM

## 2019-10-27 DIAGNOSIS — E785 Hyperlipidemia, unspecified: Secondary | ICD-10-CM

## 2019-10-27 NOTE — Chronic Care Management (AMB) (Signed)
  Care Management   Follow Up Note   10/27/2019 Name: Kristen Frost MRN: [id number] DOB: August 13, 1955  Referred by: [name], MD Reason for referral : Care Coordination (HTN, NIDDM, HLD, CKD)   Kristen Frost is a 64 y.o. year old female who is a primary care patient of [name], [name], MD. The care management team was consulted for assistance with care management and care coordination needs.    Review of patient status, including review of consultants reports, relevant laboratory and other test results, and collaboration with appropriate care team members and the patient's provider was performed as part of comprehensive patient evaluation and provision of chronic care management services.    SDOH (Social Determinants of Health) assessments performed: No See Care Plan activities for detailed interventions related to [facility])     Advanced Directives: See Care Plan and Vynca application for related entries.   Goals Addressed              This Visit's Progress     Patient Stated   .  Blood Pressure < 130/80 (pt-stated)        BP Readings from Last 3 Encounters:  09/22/19 129/67  09/15/19 (!) 132/78  09/13/19 (!) 149/76   Patient called on 10/25/19 and left message for this [name] RN stating she received her home blood pressure monitor in the mail from [place] on 10/24/19.        The care management team will reach out to the patient again over the next 1- 3 days.   [name], [name], [facility] RN Care Manager [phone]

## 2019-10-28 ENCOUNTER — Other Ambulatory Visit: Payer: Self-pay

## 2019-10-28 ENCOUNTER — Telehealth: Payer: Medicaid Other

## 2019-10-28 ENCOUNTER — Encounter: Payer: Self-pay | Admitting: Licensed Clinical Social Worker

## 2019-10-28 ENCOUNTER — Ambulatory Visit (INDEPENDENT_AMBULATORY_CARE_PROVIDER_SITE_OTHER): Payer: Medicaid Other | Admitting: Licensed Clinical Social Worker

## 2019-10-28 DIAGNOSIS — F419 Anxiety disorder, unspecified: Secondary | ICD-10-CM

## 2019-10-28 NOTE — Progress Notes (Signed)
Integrated Behavioral Health Visit via Telemedicine (Telephone)  10/28/2019 Kristen Frost [id number]   Session Start time: 8:37  Session End time: 9:00 Total time: 23 minutes  Type of Service: Individual Visit Type: Telephonic Referring Provider: Dr. Johnney Ou  Patient location: Home [facility] Provider location: Office All persons participating in visit: Patient and [facility]  Discussed confidentiality: Yes   "By engaging in this telephone visit, you consent to the provision of healthcare.  Additionally, you authorize for your insurance to be billed for the services provided during this telephone visit."   Confirmed patient's address: Yes  Confirmed patient's phone number: Yes  Any changes to demographics: No   The following statements were read to the patient and/or legal guardian that are established with the [facility] Provider.  "The purpose of this phone visit is to provide behavioral health care while limiting exposure to the coronavirus (COVID19).  There is a possibility of technology failure and discussed alternative modes of communication if that failure occurs."   Patient and/or legal guardian consented to telephone visit: Yes   PRESENTING CONCERNS: Patient and/or family reports the following symptoms/concerns: anxiety, family stressors, sadness, and recent family dynamic changes. Duration of problem: since 2010; Severity of problem: mild  STRENGTHS (Protective Factors/Coping Skills): Social and Emotional competence, Concrete supports in place (healthy food, safe environments, etc.) and Sense of purpose  ASSESSMENT: Patient currently experiencing mild to moderate levels of anxiety. Patient is continuing to work on managing and reducing her weight/eating habits. Patient processed how this increase in self-care has been helpful to her overall mood. Patient is wanting to explore other advanced options for weight loss. Patient recently fell, and is currently in pain  (has an upcoming appointment in our office). Patient continues to have anxiety due to the ongoing custody case with her step-son. Patient was encouraged to focus only on what is within her control.   GOALS ADDRESSED: Patient will: 1.  Reduce symptoms of: anxiety and stress  2.  Increase knowledge and/or ability of: coping skills, healthy habits and stress reduction  3.  Demonstrate ability to: Increase healthy adjustment to current life circumstances, Increase adequate support systems for patient/family and Increase motivation to adhere to plan of care  Progress of Goals: Ongoing  INTERVENTIONS: Interventions utilized:  Motivational Interviewing, Solution-Focused Strategies, Brief CBT and Supportive Counseling Standardized Assessments completed & reviewed: Not Needed   OUTCOME: Patient Response: Continuing to work on improving her self-care. Implementing healthy coping skills to reduce stress.    PLAN: 1. Follow up with behavioral health clinician on : one week.    [name], [facility], [place]

## 2019-10-29 ENCOUNTER — Ambulatory Visit (INDEPENDENT_AMBULATORY_CARE_PROVIDER_SITE_OTHER): Payer: Medicaid Other | Admitting: Internal Medicine

## 2019-10-29 ENCOUNTER — Encounter: Payer: Self-pay | Admitting: Internal Medicine

## 2019-10-29 ENCOUNTER — Other Ambulatory Visit: Payer: Self-pay

## 2019-10-29 VITALS — BP 142/79 | HR 88 | Temp 98.6°F | Ht 61.0 in | Wt 238.2 lb

## 2019-10-29 DIAGNOSIS — M25512 Pain in left shoulder: Secondary | ICD-10-CM | POA: Diagnosis present

## 2019-10-29 DIAGNOSIS — I10 Essential (primary) hypertension: Secondary | ICD-10-CM

## 2019-10-29 MED ORDER — AMLODIPINE BESYLATE 10 MG PO TABS: ORAL_TABLET | ORAL | 3 refills | Status: DC

## 2019-10-29 NOTE — Progress Notes (Signed)
   CC: Left shoulder pain, low back pain  HPI:  [name] is a 64 y.o. African-American woman with medical history significant for hypertension, type 2 diabetes mellitus, hyperlipidemia, arthritis, anxiety who is presenting to the clinic today with left shoulder pain.  Please see problem based charting for further details.  Past Medical History:  Diagnosis Date  . Anxiety   . Arthritis   . Bleeding hemorrhoids    grade 2  . Diverticulosis of colon   . Full dentures   . History of colon polyps   . Hyperlipidemia   . Hypertension    followed by pcp   (04-30-2019  per pt had stress test approx. 2005, was told normal (not available in epic)  . IDA (iron deficiency anemia)   . OSA on CPAP    followed by dr Halford Chessman ([place] [facility])    (04-30-2019  pt  stated uses every night)  . Restless leg syndrome   . Type 2 diabetes mellitus ([place])    followed by pcp   (04-30-2019  pt stated does not check blood sugar at home)   Review of Systems: As per HPI  Physical Exam:  Vitals:   10/29/19 1508  BP: (!) 142/79  Pulse: 88  Temp: 98.6 F (37 C)  TempSrc: Oral  SpO2: 99%  Weight: 238 lb 3.2 oz (108 kg)  Height: 5\' 1"  (1.549 m)   Physical Exam Constitutional:      Appearance: She is obese.  Musculoskeletal:        General: Tenderness present. No swelling, deformity or signs of injury.     Right upper arm: Normal. No edema, deformity, tenderness or bony tenderness.     Left upper arm: Normal. No edema, deformity, tenderness or bony tenderness.       Arms:  Skin:    Comments: Old surgical scar at the left shoulder  Psychiatric:        Mood and Affect: Mood normal.        Behavior: Behavior normal.     Assessment & Plan:   See Encounters Tab for problem based charting.  Patient discussed with Dr. Daryll Drown

## 2019-10-29 NOTE — Assessment & Plan Note (Signed)
Left shoulder pain: Kristen Frost unfortunately suffered a ground-level fall at [employer] in July when she fell in a puddle of water.  She was seen in the emergency department where an x-ray of her lower back and hip x-ray did not reveal any acute fractures however it did show L4-L5 facet DJD.  She states that for the past couple days she has been experiencing left shoulder pain and she describes the pain as "tightness.  "She denies neck pain, numbness, tingling or any shooting pain that travels caudally to her fingers.  She reports relief with icy hot which her husband applies on her, as needed Robaxin (does not take this as often).  Today, she took an Advil about an hour and half before presenting to the clinic and states that this helped her pain.  She does mention a remote history of left rotator cuff injury and surgery however her ongoing left shoulder pain feels like muscle spasm.  On physical exams, she does not have any pain with active or passive range of motion.  She is able to AB duct and adductor bilateral shoulders appropriately.  Her upper extremity strength is unremarkable at 5/5.  Assessment and plan: Likely musculoskeletal in nature.  Plan: -We will refer to physical therapy -Advised to alternate between Tylenol and NSAIDs for her pain -We will hold off obtaining x-ray of the neck or shoulders for now.

## 2019-10-29 NOTE — Patient Instructions (Signed)
Kristen Frost,  Thanks was seen me today.  In regards to her left shoulder pain, as we discussed I would refer you to physical therapy.  For pain, you can alternate between Tylenol and ibuprofen.  Whenever you feel the muscle tightness, I do like for you to take the muscle relaxant.  Take care! Dr. [name]  Please call the internal medicine center clinic if you have any questions or concerns, we may be able to help and keep you from a long and expensive emergency room wait. Our clinic and after hours phone number is [phone], the best time to call is Monday through Friday 9 am to 4 pm but there is always someone available 24/7 if you have an emergency. If you need medication refills please notify your pharmacy one week in advance and they will send [place] a request.

## 2019-10-30 NOTE — Progress Notes (Signed)
Internal Medicine Clinic Attending  Case discussed with Dr. Agyei  At the time of the visit.  We reviewed the resident's history and exam and pertinent patient test results.  I agree with the assessment, diagnosis, and plan of care documented in the resident's note.  

## 2019-11-02 NOTE — Progress Notes (Signed)
Internal Medicine Clinic Attending  Case discussed with Dr. Agyei  At the time of the visit.  We reviewed the resident's history and exam and pertinent patient test results.  I agree with the assessment, diagnosis, and plan of care documented in the resident's note.  

## 2019-11-04 ENCOUNTER — Other Ambulatory Visit: Payer: Self-pay

## 2019-11-04 ENCOUNTER — Encounter: Payer: Self-pay | Admitting: Licensed Clinical Social Worker

## 2019-11-04 ENCOUNTER — Ambulatory Visit (INDEPENDENT_AMBULATORY_CARE_PROVIDER_SITE_OTHER): Payer: Medicaid Other | Admitting: Licensed Clinical Social Worker

## 2019-11-04 DIAGNOSIS — F419 Anxiety disorder, unspecified: Secondary | ICD-10-CM

## 2019-11-04 NOTE — BH Specialist Note (Signed)
Integrated Behavioral Health Visit via Telemedicine (Telephone)  11/04/2019 Haskell Riling [id number]   Session Start time: 9:00  Session End time: 9:30 Total time: 30 minutes  Type of Service: Individual Visit Type: Telephonic Referring Provider: Dr. Johnney Ou  Patient location: Home [facility] Provider location: Office All persons participating in visit: [facility] and patient  Discussed confidentiality: Yes   "By engaging in this telephone visit, you consent to the provision of healthcare.  Additionally, you authorize for your insurance to be billed for the services provided during this telephone visit."   Patient and/or legal guardian consented to telephone visit: Yes    Confirmed patient's address: Yes  Confirmed patient's phone number: Yes  Any changes to demographics: No   The following statements were read to the patient and/or legal guardian that are established with the [facility] Provider.  "The purpose of this phone visit is to provide behavioral health care while limiting exposure to the coronavirus (COVID19).  There is a possibility of technology failure and discussed alternative modes of communication if that failure occurs."   PRESENTING CONCERNS: Patient and/or family reports the following symptoms/concerns:  anxiety, family stressors, sadness, and recent family dynamic changes. Duration of problem: since 2010; Severity of problem: mild-moderate  STRENGTHS (Protective Factors/Coping Skills): Social connections, Social and Emotional competence, Concrete supports in place (healthy food, safe environments, etc.) and Sense of purpose  ASSESSMENT: Patient currently experiencing mild to moderate levels of stress due to family court tomorrow for the custody of her step-son. Patient processed her feelings towards this entire process, and how it has caused stress in her life. Patient is hopeful the custody process will find closure tomorrow, and the result will be  her step-son remaining in her home. Patient is continuing to lose weight, and work on her self-care.   Patient was informed of my departure, and will be transitioned to our [facility] clinician.     GOALS ADDRESSED: Patient will: 1.  Reduce symptoms of: anxiety and stress  2.  Increase knowledge and/or ability of: coping skills and stress reduction  3.  Demonstrate ability to: Increase healthy adjustment to current life circumstances and Increase adequate support systems for patient/family  Progress of Goals: Ongoing. Patient has made progress with adjusting to her step-son living with her, and only hopes that he can remain.  INTERVENTIONS: Interventions utilized:  Brief CBT and Supportive Counseling Standardized Assessments completed & reviewed: Not Needed  PLAN: 1. Follow up with behavioral health clinician on : transitioning to [place], [facility].  2. Referral(s): [place] (In Clinic)   [place], [facility], [place]

## 2019-11-09 ENCOUNTER — Other Ambulatory Visit (HOSPITAL_COMMUNITY)
Admission: RE | Admit: 2019-11-09 | Discharge: 2019-11-09 | Disposition: A | Discharge status: Home / Self Care | Payer: Medicaid Other | Source: Ambulatory Visit | Attending: Obstetrics and Gynecology | Admitting: Obstetrics and Gynecology

## 2019-11-09 ENCOUNTER — Other Ambulatory Visit: Payer: Self-pay

## 2019-11-09 ENCOUNTER — Ambulatory Visit (INDEPENDENT_AMBULATORY_CARE_PROVIDER_SITE_OTHER): Payer: Medicaid Other | Admitting: Obstetrics and Gynecology

## 2019-11-09 ENCOUNTER — Encounter: Payer: Self-pay | Admitting: Obstetrics and Gynecology

## 2019-11-09 DIAGNOSIS — Z01419 Encounter for gynecological examination (general) (routine) without abnormal findings: Secondary | ICD-10-CM | POA: Insufficient documentation

## 2019-11-09 NOTE — Patient Instructions (Signed)
Health Maintenance, Female Adopting a healthy lifestyle and getting preventive care are important in promoting health and wellness. Ask your health care provider about:  The right schedule for you to have regular tests and exams.  Things you can do on your own to prevent diseases and keep yourself healthy. What should I know about diet, weight, and exercise? Eat a healthy diet   Eat a diet that includes plenty of vegetables, fruits, low-fat dairy products, and lean protein.  Do not eat a lot of foods that are high in solid fats, added sugars, or sodium. Maintain a healthy weight Body mass index (BMI) is used to identify weight problems. It estimates body fat based on height and weight. Your health care provider can help determine your BMI and help you achieve or maintain a healthy weight. Get regular exercise Get regular exercise. This is one of the most important things you can do for your health. Most adults should:  Exercise for at least 150 minutes each week. The exercise should increase your heart rate and make you sweat (moderate-intensity exercise).  Do strengthening exercises at least twice a week. This is in addition to the moderate-intensity exercise.  Spend less time sitting. Even light physical activity can be beneficial. Watch cholesterol and blood lipids Have your blood tested for lipids and cholesterol at 64 years of age, then have this test every 5 years. Have your cholesterol levels checked more often if:  Your lipid or cholesterol levels are high.  You are older than 64 years of age.  You are at high risk for heart disease. What should I know about cancer screening? Depending on your health history and family history, you may need to have cancer screening at various ages. This may include screening for:  Breast cancer.  Cervical cancer.  Colorectal cancer.  Skin cancer.  Lung cancer. What should I know about heart disease, diabetes, and high blood  pressure? Blood pressure and heart disease  High blood pressure causes heart disease and increases the risk of stroke. This is more likely to develop in people who have high blood pressure readings, are of African descent, or are overweight.  Have your blood pressure checked: ? Every 3-5 years if you are 18-39 years of age. ? Every year if you are 40 years old or older. Diabetes Have regular diabetes screenings. This checks your fasting blood sugar level. Have the screening done:  Once every three years after age 40 if you are at a normal weight and have a low risk for diabetes.  More often and at a younger age if you are overweight or have a high risk for diabetes. What should I know about preventing infection? Hepatitis B If you have a higher risk for hepatitis B, you should be screened for this virus. Talk with your health care provider to find out if you are at risk for hepatitis B infection. Hepatitis C Testing is recommended for:  Everyone born from 1945 through 1965.  Anyone with known risk factors for hepatitis C. Sexually transmitted infections (STIs)  Get screened for STIs, including gonorrhea and chlamydia, if: ? You are sexually active and are younger than 64 years of age. ? You are older than 64 years of age and your health care provider tells you that you are at risk for this type of infection. ? Your sexual activity has changed since you were last screened, and you are at increased risk for chlamydia or gonorrhea. Ask your health care provider if   you are at risk.  Ask your health care provider about whether you are at high risk for HIV. Your health care provider may recommend a prescription medicine to help prevent HIV infection. If you choose to take medicine to prevent HIV, you should first get tested for HIV. You should then be tested every 3 months for as long as you are taking the medicine. Pregnancy  If you are about to stop having your period (premenopausal) and  you may become pregnant, seek counseling before you get pregnant.  Take 400 to 800 micrograms (mcg) of folic acid every day if you become pregnant.  Ask for birth control (contraception) if you want to prevent pregnancy. Osteoporosis and menopause Osteoporosis is a disease in which the bones lose minerals and strength with aging. This can result in bone fractures. If you are 65 years old or older, or if you are at risk for osteoporosis and fractures, ask your health care provider if you should:  Be screened for bone loss.  Take a calcium or vitamin D supplement to lower your risk of fractures.  Be given hormone replacement therapy (HRT) to treat symptoms of menopause. Follow these instructions at home: Lifestyle  Do not use any products that contain nicotine or tobacco, such as cigarettes, e-cigarettes, and chewing tobacco. If you need help quitting, ask your health care provider.  Do not use street drugs.  Do not share needles.  Ask your health care provider for help if you need support or information about quitting drugs. Alcohol use  Do not drink alcohol if: ? Your health care provider tells you not to drink. ? You are pregnant, may be pregnant, or are planning to become pregnant.  If you drink alcohol: ? Limit how much you use to 0-1 drink a day. ? Limit intake if you are breastfeeding.  Be aware of how much alcohol is in your drink. In the [place] one drink equals one 12 oz bottle of beer (355 mL), one 5 oz glass of wine (148 mL), or one 1 oz glass of hard liquor (44 mL). General instructions  Schedule regular health, dental, and eye exams.  Stay current with your vaccines.  Tell your health care provider if: ? You often feel depressed. ? You have ever been abused or do not feel safe at home. Summary  Adopting a healthy lifestyle and getting preventive care are important in promoting health and wellness.  Follow your health care provider's instructions about healthy  diet, exercising, and getting tested or screened for diseases.  Follow your health care provider's instructions on monitoring your cholesterol and blood pressure. This information is not intended to replace advice given to you by your health care provider. Make sure you discuss any questions you have with your health care provider. Document Revised: 01/29/2018 Document Reviewed: 01/29/2018 Elsevier Patient Education  2020 Elsevier Inc.  

## 2019-11-09 NOTE — Progress Notes (Signed)
Kristen Frost is a 64 y.o. [id number]. female here for a routine annual gynecologic exam.  Current complaints: chronic low back pain.   Denies abnormal vaginal bleeding, discharge, pelvic pain, problems with intercourse or other gynecologic concerns.    Gynecologic History No LMP recorded. Patient is postmenopausal. Contraception: post menopausal status Last Pap: 3/18. Results were: normal Last mammogram: 2020. Results were: normal per pt report    Obstetric History OB History  No obstetric history on file.   TSVD x 5 , largest 7 # 3 SAB  Past Medical History:  Diagnosis Date  . Anxiety   . Arthritis   . Bleeding hemorrhoids    grade 2  . Diverticulosis of colon   . Full dentures   . History of colon polyps   . Hyperlipidemia   . Hypertension    followed by pcp   (04-30-2019  per pt had stress test approx. 2005, was told normal (not available in epic)  . IDA (iron deficiency anemia)   . OSA on CPAP    followed by dr Halford Chessman ([facility])    (04-30-2019  pt  stated uses every night)  . Restless leg syndrome   . Type 2 diabetes mellitus ([place])    followed by pcp   (04-30-2019  pt stated does not check blood sugar at home)    Past Surgical History:  Procedure Laterality Date  . CHOLECYSTECTOMY N/A 06/07/2016   Procedure: LAPAROSCOPIC CHOLECYSTECTOMY;  Surgeon: Georganna Skeans, MD;  Location: [facility];  Service: General;  Laterality: N/A;  . COLONOSCOPY WITH PROPOFOL  last one 04-10-2018    dr Loletha Carrow  . ROTATOR CUFF REPAIR Left 2007   approx.  . TRANSANAL HEMORRHOIDAL DEARTERIALIZATION N/A 05/07/2019   Procedure: TRANSANAL HEMORRHOIDAL DEARTERIALIZATION;  Surgeon: Leighton Ruff, MD;  Location: [facility];  Service: General;  Laterality: N/A;    Current Outpatient Medications on File Prior to Visit  Medication Sig Dispense Refill  . amLODipine (NORVASC) 10 MG tablet TAKE 1 TABLET BY MOUTH DAILY 90 tablet 3  . aspirin EC 81 MG tablet Take 81 mg by mouth daily.     Marland Kitchen atorvastatin (LIPITOR) 40 MG tablet Take 1 tablet (40 mg total) by mouth daily. 90 tablet 3  . Cholecalciferol (VITAMIN D) 2000 units CAPS Take 1 capsule (2,000 Units total) by mouth daily. (Patient taking differently: Take 2,000 Units by mouth daily. ) 90 capsule 1  . fluticasone (FLONASE) 50 MCG/ACT nasal spray Place 1 spray into both nostrils daily. (Patient taking differently: Place 1 spray into both nostrils daily as needed. ) 16 g 0  . liraglutide (VICTOZA) 18 MG/3ML SOPN Inject 0.3 mLs (1.8 mg total) into the skin daily. 5 pen 4  . metFORMIN (GLUCOPHAGE-XR) 500 MG 24 hr tablet Take 2 tablets (1,000 mg total) by mouth daily with breakfast. (Patient taking differently: Take 1,000 mg by mouth daily with breakfast. ) 180 tablet 1  . methocarbamol (ROBAXIN) 500 MG tablet Take 1 tablet (500 mg total) by mouth 2 (two) times daily. 20 tablet 0  . ferrous sulfate 325 (65 FE) MG tablet Take 1 tablet (325 mg total) by mouth daily with breakfast. (Patient taking differently: Take 325 mg by mouth daily with breakfast. ) 90 tablet 1  . Insulin Pen Needle (PEN NEEDLES) 31G X 5 MM MISC 1 application by Does not apply route daily. 90 each 1  . lidocaine (LIDODERM) 5 % Place 1 patch onto the skin daily. Remove & Discard patch within  12 hours or as directed by MD 30 patch 0  . oxyCODONE (OXY IR/ROXICODONE) 5 MG immediate release tablet Take 1-2 tablets (5-10 mg total) by mouth every 6 (six) hours as needed. (Patient not taking: Reported on 11/09/2019) 30 tablet 0   No current facility-administered medications on file prior to visit.    No Known Allergies  Social History   Socioeconomic History  . Marital status: Married    Spouse name: Not on file  . Number of children: Not on file  . Years of education: Not on file  . Highest education level: Not on file  Occupational History  . Occupation: unemployeed  Tobacco Use  . Smoking status: Never Smoker  . Smokeless tobacco: Never Used  Vaping Use  .  Vaping Use: Never used  Substance and Sexual Activity  . Alcohol use: No  . Drug use: Never  . Sexual activity: Not on file  Other Topics Concern  . Not on file  Social History Narrative  . Not on file   Social Determinants of Health   Financial Resource Strain: Low Risk   . Difficulty of Paying Living Expenses: Not very hard  Food Insecurity: No Food Insecurity  . Worried About Charity fundraiser in the Last Year: Never true  . Ran Out of Food in the Last Year: Never true  Transportation Needs: No Transportation Needs  . Lack of Transportation (Medical): No  . Lack of Transportation (Non-Medical): No  Physical Activity: Inactive  . Days of Exercise per Week: 0 days  . Minutes of Exercise per Session: 0 min  Stress: No Stress Concern Present  . Feeling of Stress : Only a little  Social Connections: Moderately Integrated  . Frequency of Communication with Friends and Family: More than three times a week  . Frequency of Social Gatherings with Friends and Family: More than three times a week  . Attends Religious Services: More than 4 times per year  . Active Member of Clubs or Organizations: No  . Attends Archivist Meetings: Never  . Marital Status: Married  Human resources officer Violence: Not At Risk  . Fear of Current or Ex-Partner: No  . Emotionally Abused: No  . Physically Abused: No  . Sexually Abused: No    Family History  Problem Relation Age of Onset  . Allergies Father   . Alcohol abuse Father   . Tuberculosis Father        deceased from TB  . Allergies Other        sibling  . Asthma Other        sibling  . Hypertension Other        sibling  . Migraines Other        sibling  . Rashes / Skin problems Other        sibling  . Gout Other        sibling    The following portions of the patient's history were reviewed and updated as appropriate: allergies, current medications, past family history, past medical history, past social history, past  surgical history and problem list.  Review of Systems Pertinent items noted in HPI and remainder of comprehensive ROS otherwise negative.   Objective:  BP 137/75   Pulse 85   Temp 98.7 F (37.1 C)   Wt 236 lb (107 kg)   BMI 44.59 kg/m  CONSTITUTIONAL: Well-developed, well-nourished female in no acute distress.  HENT:  Normocephalic, atraumatic, External right and left ear normal. Oropharynx  is clear and moist EYES: Conjunctivae and EOM are normal. Pupils are equal, round, and reactive to light. No scleral icterus.  NECK: Normal range of motion, supple, no masses.  Normal thyroid.  SKIN: Skin is warm and dry. No rash noted. Not diaphoretic. No erythema. No pallor. [place]: Alert and oriented to person, place, and time. Normal reflexes, muscle tone coordination. No cranial nerve deficit noted. PSYCHIATRIC: Normal mood and affect. Normal behavior. Normal judgment and thought content. CARDIOVASCULAR: Normal heart rate noted, regular rhythm RESPIRATORY: Clear to auscultation bilaterally. Effort and breath sounds normal, no problems with respiration noted. BREASTS: deferred. ABDOMEN: Soft, normal bowel sounds, no distention noted.  No tenderness, rebound or guarding.  PELVIC: Normal appearing external genitalia; normal appearing vaginal mucosa and cervix.  No abnormal discharge noted.  Pap smear obtained.  Normal uterine size, no other palpable masses, no uterine or adnexal tenderness. No evidence of relaxation MUSCULOSKELETAL: Normal range of motion. No tenderness.  No cyanosis, clubbing, or edema.  2+ distal pulses.   Assessment:  Annual gynecologic examination with pap smear   Plan:  Will follow up results of pap smear and manage accordingly. Mammogram as per IM Routine preventative health maintenance measures emphasized. Please refer to After Visit Summary for other counseling recommendations.    Chancy Milroy, MD, [place] Attending [place] for  [facility], [place]

## 2019-11-13 LAB — CYTOLOGY - PAP
Adequacy: ABSENT
Comment: NEGATIVE
Diagnosis: NEGATIVE
High risk HPV: NEGATIVE

## 2019-11-20 ENCOUNTER — Ambulatory Visit: Payer: Medicaid Other | Admitting: *Deleted

## 2019-11-20 DIAGNOSIS — I10 Essential (primary) hypertension: Secondary | ICD-10-CM

## 2019-11-20 DIAGNOSIS — E785 Hyperlipidemia, unspecified: Secondary | ICD-10-CM

## 2019-11-20 DIAGNOSIS — E119 Type 2 diabetes mellitus without complications: Secondary | ICD-10-CM

## 2019-11-20 DIAGNOSIS — N182 Chronic kidney disease, stage 2 (mild): Secondary | ICD-10-CM

## 2019-11-20 NOTE — Chronic Care Management (AMB) (Signed)
Care Management   Follow Up Note   11/20/2019 Name: Kristen Frost MRN: [id number] DOB: 04-03-1955  Referred by: [name], MD Reason for referral : Care Coordination (HTN, NIDDM, HLD, CKD)   Kristen Frost is a 64 y.o. year old female who is a primary care patient of [name], [name], MD. The care management team was consulted for assistance with care management and care coordination needs.    Review of patient status, including review of consultants reports, relevant laboratory and other test results, and collaboration with appropriate care team members and the patient's provider was performed as part of comprehensive patient evaluation and provision of chronic care management services.    SDOH (Social Determinants of Health) assessments performed: No See Care Plan activities for detailed interventions related to [facility])     Advanced Directives: See Care Plan and Vynca application for related entries.   Goals Addressed              This Visit's Progress     Patient Stated   .  "I want to continue to lose weight and maintain it and keep my blood sugar in good control." (pt-stated)        CARE PLAN ENTRY (see longitudinal plan of care for additional care plan information)  Current Barriers:  . Chronic Disease Management support and education needs related to DM, HTN, HLD CKD and obesity- spoke with patient to complete follow up assessment, she says she continues to lose weight via Weight Watchers and participates in face to face weigh in every week, she says she does not check her blood sugar at home, but has been occasionally checking her blood pressure and the readings are meeting target,  she reports good medication taking behavior, says she will start outpatient physical therapy at the [facility] clinic for her left shoulder pain on 11/24/19, says she does not like taking pain medicine, even over the counter medication, but says she will  occasionally treat the pain with Tylenol   Nurse Case Manager Clinical Goal(s): Marland Kitchen Over the next 30-60 days, the patient will demonstrate ongoing self health care management ability as evidenced by meeting targets for Hgb A1C and Blood pressure and ongoing weight loss or no weight gain  Interventions:  . Inter-disciplinary care team collaboration (see longitudinal plan of care) . Evaluation of current treatment plan related to HTN, DM and obesity and patient's adherence to plan as established by provider. . Provided opportunity for patient to ask questions . Reviewed medications with patient and discussed medication taking behavior  provided positive feedback for patient's ongoing weight loss and well managed chronic health issues . Discussed plans with patient for ongoing care management follow up and provided patient with direct contact information for care management team . Advised patient, providing education and rationale, to monitor blood pressure daily and record, calling clinic provider for findings outside established parameters.   Patient Self Care Activities:  . Patient verbalizes understanding of plan to work with [name] RN to improve chronic disease self management skills . Self administers medications as prescribed . Attends all scheduled provider appointments . Calls pharmacy for medication refills . Performs ADL's independently . Performs IADL's independently . Calls provider office for new concerns or questions   Please see past updates related to this goal by clicking on the "Past Updates" button in the selected goal      .  Blood Pressure < 130/80 (pt-stated)        BP Readings from Last  3 Encounters:  11/09/19 137/75  10/29/19 (!) 142/79  09/22/19 129/67        Other   .  LDL CALC < 100        Lab Results  Component Value Date   CHOL 140 02/28/2017   HDL 52 02/28/2017   LDLCALC 71 02/28/2017   TRIG 85 02/28/2017   CHOLHDL 2.7 02/28/2017          Please  update lipid profile as pt has DM and HLD and is on statin therapy.   The care management team will reach out to the patient again over the next 30-60 days.   [name], [name], [facility] RN Care Manager [phone]

## 2019-11-20 NOTE — Progress Notes (Signed)
Internal Medicine Clinic Resident  I have personally reviewed this encounter including the documentation in this note and/or discussed this patient with the care management provider. I will address any urgent items identified by the care management provider and will communicate my actions to the patient's PCP. I have reviewed the patient's [name] visit with my supervising attending, Dr Narendra.  Mellissa Conley, MD  [facility] PGY-2 11/20/2019    

## 2019-11-20 NOTE — Patient Instructions (Signed)
Visit Information It was nice speaking with you today. Goals Addressed              This Visit's Progress     Patient Stated   .  "I want to continue to lose weight and maintain it and keep my blood sugar in good control." (pt-stated)        CARE PLAN ENTRY (see longitudinal plan of care for additional care plan information)  Current Barriers:  . Chronic Disease Management support and education needs related to DM, HTN, HLD CKD and obesity- spoke with patient to complete follow up assessment, she says she continues to lose weight via Weight Watchers and participates in face to face weigh in every week, she says she does not check her blood sugar at home, but has been occasionally checking her blood pressure and the readings are meeting target,  she reports good medication taking behavior, says she will start outpatient physical therapy at the [facility] clinic for her left shoulder pain on 11/24/19, says she does not like taking pain medicine, even over the counter medication, but says she will occasionally treat the pain with Tylenol   Nurse Case Manager Clinical Goal(s): Marland Kitchen Over the next 30-60 days, the patient will demonstrate ongoing self health care management ability as evidenced by meeting targets for Hgb A1C and Blood pressure and ongoing weight loss or no weight gain  Interventions:  . Inter-disciplinary care team collaboration (see longitudinal plan of care) . Evaluation of current treatment plan related to HTN, DM and obesity and patient's adherence to plan as established by provider. . Provided opportunity for patient to ask questions . Reviewed medications with patient and discussed medication taking behavior  . Provided positive feedback for patient's ongoing weight loss and well managed chronic health issues . Discussed plans with patient for ongoing care management follow up and provided patient with direct contact information for care management team . Advised patient,  providing education and rationale, to monitor blood pressure daily and record, calling clinic provider for findings outside established parameters.   Patient Self Care Activities:  . Patient verbalizes understanding of plan to work with [name] RN to improve chronic disease self management skills . Self administers medications as prescribed . Attends all scheduled provider appointments . Calls pharmacy for medication refills . Performs ADL's independently . Performs IADL's independently . Calls provider office for new concerns or questions   Please see past updates related to this goal by clicking on the "Past Updates" button in the selected goal      .  Blood Pressure < 130/80 (pt-stated)        BP Readings from Last 3 Encounters:  11/09/19 137/75  10/29/19 (!) 142/79  09/22/19 129/67        Other   .  LDL CALC < 100        Lab Results  Component Value Date   CHOL 140 02/28/2017   HDL 52 02/28/2017   LDLCALC 71 02/28/2017   TRIG 85 02/28/2017   CHOLHDL 2.7 02/28/2017           The patient verbalized understanding of instructions provided today and declined a print copy of patient instruction materials.   The care management team will reach out to the patient again over the next 30-60 days.   [name], [name], [facility] RN Care Manager [phone]

## 2019-11-24 ENCOUNTER — Encounter: Payer: Self-pay | Admitting: Physical Therapy

## 2019-11-24 ENCOUNTER — Ambulatory Visit: Payer: Medicaid Other | Attending: Internal Medicine | Admitting: Physical Therapy

## 2019-11-24 ENCOUNTER — Other Ambulatory Visit: Payer: Self-pay

## 2019-11-24 DIAGNOSIS — M25512 Pain in left shoulder: Secondary | ICD-10-CM | POA: Insufficient documentation

## 2019-11-24 DIAGNOSIS — M545 Low back pain, unspecified: Secondary | ICD-10-CM | POA: Insufficient documentation

## 2019-11-24 DIAGNOSIS — R252 Cramp and spasm: Secondary | ICD-10-CM | POA: Diagnosis not present

## 2019-11-24 DIAGNOSIS — M25511 Pain in right shoulder: Secondary | ICD-10-CM | POA: Insufficient documentation

## 2019-11-24 NOTE — Therapy (Signed)
[place], [place], [address] Phone: [phone]   Fax:  [phone]  Physical Therapy Evaluation  Patient Details  Name: Kristen Frost MRN: [id number] Date of Birth: 08/18/1955 Referring Provider (PT): Dr. Alita Chyle    Encounter Date: 11/24/2019   PT End of Session - 11/24/19 0910    Visit Number 1    Number of Visits 12    Date for PT Re-Evaluation 01/19/20    Authorization Type UHC MCD    PT Start Time 0835    PT Stop Time 0915    PT Time Calculation (min) 40 min    Activity Tolerance Patient tolerated treatment well    Behavior During Therapy [facility] for tasks assessed/performed           Past Medical History:  Diagnosis Date  . Anxiety   . Arthritis   . Bleeding hemorrhoids    grade 2  . Diverticulosis of colon   . Full dentures   . History of colon polyps   . Hyperlipidemia   . Hypertension    followed by pcp   (04-30-2019  per pt had stress test approx. 2005, was told normal (not available in epic)  . IDA (iron deficiency anemia)   . OSA on CPAP    followed by dr Halford Chessman ([place])    (04-30-2019  pt  stated uses every night)  . Restless leg syndrome   . Type 2 diabetes mellitus ([place])    followed by pcp   (04-30-2019  pt stated does not check blood sugar at home)    Past Surgical History:  Procedure Laterality Date  . CHOLECYSTECTOMY N/A 06/07/2016   Procedure: LAPAROSCOPIC CHOLECYSTECTOMY;  Surgeon: Georganna Skeans, MD;  Location: [place] [facility];  Service: General;  Laterality: N/A;  . COLONOSCOPY WITH PROPOFOL  last one 04-10-2018    dr Loletha Carrow  . ROTATOR CUFF REPAIR Left 2007   approx.  . TRANSANAL HEMORRHOIDAL DEARTERIALIZATION N/A 05/07/2019   Procedure: TRANSANAL HEMORRHOIDAL DEARTERIALIZATION;  Surgeon: Leighton Ruff, MD;  Location: [facility];  Service: General;  Laterality: N/A;    There were no vitals filed for this visit.    Subjective Assessment -  11/24/19 0839    Subjective Pt fell in [employer] 09/13/19 landed on her back in a puddle.  Went to ED and followe dup with Internal Med.  Patient is having pain in post L shoulder, muscle spasms.  She has low back pain bilateral across hips but does not radiate.  Shoulder pain does not radiate but did have an instance of her Rt Arm being unable to move.  Her pain limits her from doing ADLs, lifting and using her arm for simple tasks.    Pertinent History diabetes, obesity, HTN, anxiety    Limitations Lifting;House hold activities;Standing;Walking    How long can you walk comfortably? pain can hit her after 20 min at times    Diagnostic tests XR normal other than L4-L5    Patient Stated Goals patient wants to be pain free is possible    Currently in Pain? Yes    Pain Score 8     Pain Location Shoulder    Pain Orientation Left;Posterior    Pain Descriptors / Indicators Spasm;Tightness    Pain Type Acute pain    Pain Onset More than a month ago    Pain Frequency Constant    Aggravating Factors  can vary,  activity    Pain Relieving  Factors hot water, tylenol, meds, reclining , positioning    Effect of Pain on Daily Activities limits participation    Multiple Pain Sites Yes    Pain Score 6    Pain Location Back    Pain Orientation Right;Left;Lower    Pain Descriptors / Indicators Aching;Sore;Tightness    Pain Onset More than a month ago    Pain Frequency Intermittent    Aggravating Factors  varies    Pain Relieving Factors rest, meds, heat    Effect of Pain on Daily Activities ADLs, mobility              [facility] PT Assessment - 11/24/19 0001      Assessment   Medical Diagnosis L shoulder, low back pain     Referring Provider (PT) Dr. Alita Chyle     Onset Date/Surgical Date 09/13/19    Hand Dominance Right    Next MD Visit within a month     Prior Therapy Yes 2018      Precautions   Precautions None      Restrictions   Weight Bearing Restrictions No      Balance Screen    Has the patient fallen in the past 6 months No      [place] residence    Living Arrangements Spouse/significant other;Children    Type of Home Apartment    Home Access Level entry    Home Layout Two level    Alternate Level Stairs-Number of Steps 12    Alternate Level Stairs-Rails Right;Left      Prior Function   Level of Independence Independent    Vocation Retired    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, group home , retail, [employer], [employer] right now       Cognition   Overall Cognitive Status Within Functional Limits for tasks assessed      Observation/Other Assessments   Focus on Therapeutic Outcomes (FOTO)  NT MCD       Sensation   Light Touch Appears Intact      Posture/Postural Control   Posture/Postural Control Postural limitations    Postural Limitations Rounded Shoulders;Forward head      AROM   Overall AROM Comments limited in end range shoulder flexion, abd , ER and IR     Lumbar Flexion WFL     Lumbar Extension 25% pain     Lumbar - Right Rotation pain to Rt     Lumbar - Left Rotation [facility]       Strength   Right Shoulder Flexion 4+/5    Right Shoulder ABduction 4+/5    Right Shoulder Internal Rotation 5/5    Right Shoulder External Rotation 5/5    Left Shoulder Flexion 4+/5    Left Shoulder ABduction 4+/5    Left Shoulder Internal Rotation 5/5    Left Shoulder External Rotation 5/5    Right Hip Flexion 4+/5    Left Hip Flexion 4/5    Right Knee Flexion 5/5    Right Knee Extension 5/5    Left Knee Flexion 5/5    Left Knee Extension 5/5      Palpation   Palpation comment pain in L medial scapular border, rhomboid and lower , middle and upper trap.  Lumbar grossly tender in L4-L5-S1 and across superior glutes            Objective measurements completed on examination: See above findings.  [facility] Adult PT Treatment/Exercise - 11/24/19 0001      Self-Care    Self-Care Posture;Other Self-Care Comments    Posture spine mobility     Other Self-Care Comments  muscle spasms, heat, MHP, dry needling       Lumbar Exercises: Quadruped   Madcat/Old Horse 5 reps    Other Quadruped Lumbar Exercises childs pose       Shoulder Exercises: Standing   Row Strengthening    Theraband Level (Shoulder Row) Level 3 (Green)    Row Weight (lbs) x 15                       PT Long Term Goals - 11/24/19 1344      PT LONG TERM GOAL #1   Title Pt will be able to lift her L arm overhead with no pain for improved reaching, ADLs    Baseline moderate to severe pain    Time 6    Period Weeks    Status New    Target Date 01/08/20      PT LONG TERM GOAL #2   Title Pt will be I with HEP for cervical stab, posture, ROM, UE strength     Baseline unknown    Time 6    Period Weeks    Status New    Target Date 01/08/20      PT LONG TERM GOAL #3   Title Pt will be able to carry items waist height without increasing pain in L UE     Baseline unable to do this , pain increases    Time 6    Period Weeks    Status New    Target Date 01/08/20      PT LONG TERM GOAL #4   Title Pt will be able to walk as she needs to in the community without increased low back pain    Baseline pain can be severe 10-20 min    Time 6    Period Weeks    Status New    Target Date 01/08/20      PT LONG TERM GOAL #5   Title Pt will be able to demo pain free AROM of bilateral UEs.    Baseline pain end range all planes    Time 6    Period Weeks    Status New    Target Date 01/08/20                  Plan - 11/24/19 1638    Clinical Impression Statement Patient presents with shoulder and low back pain following an unfortunate fall back in July 2021. She has pain with L shoulder ROM, min weakness and myofascial pain.  Her low back pain is less of an issue however it does impact her ability to walk and stand in her house and in the community.  She did not have any  shoulder imaging at the time of the incident.  She has had a history of shoulder pain in the past but it was not an issue prior to this injury. She should do well and benefit from PNE, PT intervention to improve well being and continue weight loss efforts.    Personal Factors and Comorbidities Comorbidity 3+;Time since onset of injury/illness/exacerbation    Comorbidities obesity, HTN, diabetes, Lumbar DJD    Examination-Activity Limitations Lift;Stand;Bed Mobility;Locomotion Level;Bend;Reach Overhead;Transfers;Carry;Sit;Dressing;Stairs;Squat;Caring for Others    Examination-Participation Restrictions Cleaning;Community Activity;Interpersonal Relationship;Laundry;Shop    Stability/Clinical Decision Making Stable/Uncomplicated  Clinical Decision Making Low    Rehab Potential Excellent    PT Frequency 2x / week    PT Duration 6 weeks   allow 8 weeks to schedule   PT Treatment/Interventions ADLs/Self Care Home Management;Electrical Stimulation;Moist Heat;Cryotherapy;Ultrasound;Functional mobility training;Dry needling;Manual techniques;Therapeutic exercise;Patient/family education;Therapeutic activities;Neuromuscular re-education;Taping;Spinal Manipulations    PT Next Visit Plan check HEP, UBE, core as tolerated    PT Home Exercise Plan row, q-ped cat camel to childs pose    Consulted and Agree with Plan of Care Patient           Patient will benefit from skilled therapeutic intervention in order to improve the following deficits and impairments:  Increased fascial restricitons, Pain, Increased muscle spasms, Decreased mobility, Decreased range of motion, Decreased strength, Impaired flexibility, Difficulty walking, Obesity, Impaired UE functional use  Visit Diagnosis: Acute pain of both shoulders  Acute midline low back pain, unspecified whether sciatica present  Cramp and spasm     Problem List Patient Active Problem List   Diagnosis Date Noted  . Visit for routine gyn exam  11/09/2019  . Left shoulder pain 10/29/2019  . Acute back pain 09/22/2019  . CKD (chronic kidney disease) stage 2, GFR 60-89 ml/min 08/09/2016  . Healthcare maintenance 05/17/2016  . Vitamin D deficiency 03/09/2016  . Dyslipidemia 03/02/2016  . Morbid obesity ([place]) 03/01/2016  . T2DM (type 2 diabetes mellitus) ([place]) 03/01/2016  . HTN (hypertension) 11/17/2015  . OSA (obstructive sleep apnea) 12/04/2011  . RLS (restless legs syndrome) 12/04/2011    Lizett Chowning 11/24/2019, 4:49 PM  [facility] [address] [place], [place], [address] Phone: [phone]   Fax:  [phone]  Name: Kristen Frost MRN: [id number] Date of Birth: [birth year]  Raeford Razor, PT 11/24/19 4:49 PM Phone: [phone] Fax: [phone]

## 2019-11-24 NOTE — Patient Instructions (Signed)
Access Code: [email or web address]: [email or web address]: 10/05/2021Prepared by: Anderson Malta PaaExercises  Cat-Camel to Child's Pose - 2 x daily - 7 x weekly - 2 sets - 10 reps - 10 hold  Standing Bilateral Low Shoulder Row with Anchored Resistance - 2 x daily - 7 x weekly - 2 sets - 15 reps - 5 hold

## 2019-11-25 ENCOUNTER — Other Ambulatory Visit: Payer: Self-pay | Admitting: Student

## 2019-11-25 DIAGNOSIS — I10 Essential (primary) hypertension: Secondary | ICD-10-CM

## 2019-11-25 MED ORDER — AMLODIPINE BESYLATE 10 MG PO TABS: ORAL_TABLET | ORAL | 3 refills | Status: DC

## 2019-11-25 NOTE — Telephone Encounter (Signed)
Having problems getting; amLODipine (NORVASC) 10 MG tablet  pls contact: [facility] [place], [place] - [address] AT [place]  Pt was told it was the physician number

## 2019-11-27 NOTE — Progress Notes (Signed)
Internal Medicine Clinic Attending  [name] services provided by the care management provider and their documentation were discussed with Dr. Aslam. We reviewed the pertinent findings, urgent action items addressed by the resident and non-urgent items to be addressed by the PCP.  I agree with the assessment, diagnosis, and plan of care documented in the [name] and resident's note.  Christine Schiefelbein, MD 11/27/2019  

## 2019-12-02 ENCOUNTER — Ambulatory Visit: Payer: Medicaid Other | Admitting: Physical Therapy

## 2019-12-02 ENCOUNTER — Other Ambulatory Visit: Payer: Self-pay

## 2019-12-02 ENCOUNTER — Encounter: Payer: Self-pay | Admitting: Physical Therapy

## 2019-12-02 DIAGNOSIS — M25511 Pain in right shoulder: Secondary | ICD-10-CM

## 2019-12-02 DIAGNOSIS — M545 Low back pain, unspecified: Secondary | ICD-10-CM

## 2019-12-02 DIAGNOSIS — R252 Cramp and spasm: Secondary | ICD-10-CM

## 2019-12-02 NOTE — Therapy (Signed)
[place], [place], [address] Phone: [phone]   Fax:  [phone]  Physical Therapy Treatment  Patient Details  Name: Kristen Frost MRN: [id number] Date of Birth: 06-20-1955 Referring Provider (PT): Dr. Alita Chyle    Encounter Date: 12/02/2019   PT End of Session - 12/02/19 0921    Visit Number 2    Number of Visits 12    Date for PT Re-Evaluation 01/19/20    Authorization Type UHC MCD    Authorization - Visit Number 2    Authorization - Number of Visits 27    PT Start Time 0917    PT Stop Time 1012    PT Time Calculation (min) 55 min    Activity Tolerance Patient tolerated treatment well    Behavior During Therapy [facility] for tasks assessed/performed           Past Medical History:  Diagnosis Date  . Anxiety   . Arthritis   . Bleeding hemorrhoids    grade 2  . Diverticulosis of colon   . Full dentures   . History of colon polyps   . Hyperlipidemia   . Hypertension    followed by pcp   (04-30-2019  per pt had stress test approx. 2005, was told normal (not available in epic)  . IDA (iron deficiency anemia)   . OSA on CPAP    followed by dr Halford Chessman ([facility])    (04-30-2019  pt  stated uses every night)  . Restless leg syndrome   . Type 2 diabetes mellitus ([place])    followed by pcp   (04-30-2019  pt stated does not check blood sugar at home)    Past Surgical History:  Procedure Laterality Date  . CHOLECYSTECTOMY N/A 06/07/2016   Procedure: LAPAROSCOPIC CHOLECYSTECTOMY;  Surgeon: Georganna Skeans, MD;  Location: [place] [facility];  Service: General;  Laterality: N/A;  . COLONOSCOPY WITH PROPOFOL  last one 04-10-2018    dr Loletha Carrow  . ROTATOR CUFF REPAIR Left 2007   approx.  . TRANSANAL HEMORRHOIDAL DEARTERIALIZATION N/A 05/07/2019   Procedure: TRANSANAL HEMORRHOIDAL DEARTERIALIZATION;  Surgeon: Leighton Ruff, MD;  Location: [facility];  Service: General;  Laterality: N/A;     There were no vitals filed for this visit.   Subjective Assessment - 12/02/19 0918    Subjective Right now, not feeling pain.  This AM pain was 8/10 in shoulder.    Pain Score 0-No pain   8/10 this AM             [facility] Adult PT Treatment/Exercise - 12/02/19 0001      Lumbar Exercises: Quadruped   Other Quadruped Lumbar Exercises modified for knees: standing, countertop      Shoulder Exercises: Supine   Horizontal ABduction 15 reps    Theraband Level (Shoulder Horizontal ABduction) Level 2 (Red)    Theraband Level (Shoulder External Rotation) Level 2 (Red)    External Rotation Weight (lbs) x 15     Shoulder Flexion Weight (lbs) dowel x 10 AAROM       Shoulder Exercises: Standing   Extension Both;15 reps;Theraband    Theraband Level (Shoulder Extension) Level 3 (Green)    Row Strengthening    Theraband Level (Shoulder Row) Level 3 (Green)    Row Weight (lbs) x 15      Shoulder Exercises: ROM/Strengthening   UBE (Upper Arm Bike) 3 min FW and 2 min back, L1       Modalities  Modalities Moist Heat      Moist Heat Therapy   Number Minutes Moist Heat 10 Minutes    Moist Heat Location Shoulder;Lumbar Spine      Manual Therapy   Manual Therapy Soft tissue mobilization    Manual therapy comments sidelying, worked to elongate L trunk     Soft tissue mobilization L upper trap, L lumbar (Quadratus, paraspinals )                        PT Long Term Goals - 11/24/19 1344      PT LONG TERM GOAL #1   Title Pt will be able to lift her L arm overhead with no pain for improved reaching, ADLs    Baseline moderate to severe pain    Time 6    Period Weeks    Status New    Target Date 01/08/20      PT LONG TERM GOAL #2   Title Pt will be I with HEP for cervical stab, posture, ROM, UE strength     Baseline unknown    Time 6    Period Weeks    Status New    Target Date 01/08/20      PT LONG TERM GOAL #3   Title Pt will be able to carry items waist height  without increasing pain in L UE     Baseline unable to do this , pain increases    Time 6    Period Weeks    Status New    Target Date 01/08/20      PT LONG TERM GOAL #4   Title Pt will be able to walk as she needs to in the community without increased low back pain    Baseline pain can be severe 10-20 min    Time 6    Period Weeks    Status New    Target Date 01/08/20      PT LONG TERM GOAL #5   Title Pt will be able to demo pain free AROM of bilateral UEs.    Baseline pain end range all planes    Time 6    Period Weeks    Status New    Target Date 01/08/20                 Plan - 12/02/19 0934    Clinical Impression Statement Patient with min increase in pain with exercises, soreness.  Cues to relax shoulders throughout exercises.  She responded well to soft tissue work to L shoulder and L lumbar regions.  Cont POC.  Discussed possibility to dry needling next visit.    PT Treatment/Interventions ADLs/Self Care Home Management;Electrical Stimulation;Moist Heat;Cryotherapy;Ultrasound;Functional mobility training;Dry needling;Manual techniques;Therapeutic exercise;Patient/family education;Therapeutic activities;Neuromuscular re-education;Taping;Spinal Manipulations    PT Next Visit Plan check HEP, dry needling,manual  UBE, core as tolerated.    PT Home Exercise Plan row, standing cat/camel, extension (UE) and ER (red)    Consulted and Agree with Plan of Care Patient           Patient will benefit from skilled therapeutic intervention in order to improve the following deficits and impairments:  Increased fascial restricitons, Pain, Increased muscle spasms, Decreased mobility, Decreased range of motion, Decreased strength, Impaired flexibility, Difficulty walking, Obesity, Impaired UE functional use  Visit Diagnosis: Acute pain of both shoulders  Acute midline low back pain, unspecified whether sciatica present  Cramp and spasm     Problem List Patient Active  Problem List   Diagnosis Date Noted  . Visit for routine gyn exam 11/09/2019  . Left shoulder pain 10/29/2019  . Acute back pain 09/22/2019  . CKD (chronic kidney disease) stage 2, GFR 60-89 ml/min 08/09/2016  . Healthcare maintenance 05/17/2016  . Vitamin D deficiency 03/09/2016  . Dyslipidemia 03/02/2016  . Morbid obesity ([place]) 03/01/2016  . T2DM (type 2 diabetes mellitus) ([place]) 03/01/2016  . HTN (hypertension) 11/17/2015  . OSA (obstructive sleep apnea) 12/04/2011  . RLS (restless legs syndrome) 12/04/2011    Sanders Manninen 12/02/2019, 12:42 PM  [place], [place], [address] Phone: [phone]   Fax:  [phone]  Name: Alexxus Sobh MRN: [id number] Date of Birth: December 16, 1955  Raeford Razor, PT 12/02/19 12:42 PM Phone: [phone] Fax: [phone]

## 2019-12-08 ENCOUNTER — Ambulatory Visit: Payer: Medicaid Other | Admitting: Physical Therapy

## 2019-12-08 ENCOUNTER — Encounter: Payer: Self-pay | Admitting: Physical Therapy

## 2019-12-08 ENCOUNTER — Other Ambulatory Visit: Payer: Self-pay

## 2019-12-08 DIAGNOSIS — M545 Low back pain, unspecified: Secondary | ICD-10-CM

## 2019-12-08 DIAGNOSIS — R252 Cramp and spasm: Secondary | ICD-10-CM

## 2019-12-08 DIAGNOSIS — M25511 Pain in right shoulder: Secondary | ICD-10-CM

## 2019-12-08 NOTE — Patient Instructions (Addendum)
Trigger Point Dry Needling   What is Trigger Point Dry Needling (DN)? o DN is a physical therapy technique used to treat muscle pain and dysfunction. Specifically, DN helps deactivate muscle trigger points (muscle knots).  o A thin filiform needle is used to penetrate the skin and stimulate the underlying trigger point. The goal is for a local twitch response (LTR) to occur and for the trigger point to relax. No medication of any kind is injected during the procedure.    What Does Trigger Point Dry Needling Feel Like?  o The procedure feels different for each individual patient. Some patients report that they do not actually feel the needle enter the skin and overall the process is not painful. Very mild bleeding may occur. However, many patients feel a deep cramping in the muscle in which the needle was inserted. This is the local twitch response.    How Will I feel after the treatment? o Soreness is normal, and the onset of soreness may not occur for a few hours. Typically this soreness does not last longer than two days.  o Bruising is uncommon, however; ice can be used to decrease any possible bruising.  o In rare cases feeling tired or nauseous after the treatment is normal. In addition, your symptoms may get worse before they get better, this period will typically not last longer than 24 hours.    What Can I do After My Treatment? o Increase your hydration by drinking more water for the next 24 hours. o You may place ice or heat on the areas treated that have become sore, however, do not use heat on inflamed or bruised areas. Heat often brings more relief post needling. o You can continue your regular activities, but vigorous activity is not recommended initially after the treatment for 24 hours. o DN is best combined with other physical therapy such as strengthening, stretching, and other therapies.     Voncille Lo, PT, [place] Certified Exercise Expert for the Aging Adult   12/08/19 8:57 AM Phone: [phone] Fax: [phone]

## 2019-12-08 NOTE — Therapy (Signed)
[place]risten [place], [place], [address] Phone: [phone]   Fax:  [phone]  Physical Therapy Treatment  Patient Details  Name: Kristen Frost MRN: [id number] Date of Birth: 1955-07-21 Referring Provider (PT): Dr. Alita Chyle    Encounter Date: 12/08/2019   PT End of Session - 12/08/19 0853    Visit Number 3    Number of Visits 12    Date for PT Re-Evaluation 01/19/20    Authorization Type UHC MCD    Authorization - Visit Number 3    Authorization - Number of Visits 27    PT Start Time 8250    PT Stop Time 0944    PT Time Calculation (min) 57 min    Activity Tolerance Patient tolerated treatment well    Behavior During Therapy [facility] for tasks assessed/performed           Past Medical History:  Diagnosis Date  . Anxiety   . Arthritis   . Bleeding hemorrhoids    grade 2  . Diverticulosis of colon   . Full dentures   . History of colon polyps   . Hyperlipidemia   . Hypertension    followed by pcp   (04-30-2019  per pt had stress test approx. 2005, was told normal (not available in epic)  . IDA (iron deficiency anemia)   . OSA on CPAP    followed by dr Halford Chessman [facility]    (04-30-2019  pt  stated uses every night)  . Restless leg syndrome   . Type 2 diabetes mellitus ([place])    followed by pcp   (04-30-2019  pt stated does not check blood sugar at home)    Past Surgical History:  Procedure Laterality Date  . CHOLECYSTECTOMY N/A 06/07/2016   Procedure: LAPAROSCOPIC CHOLECYSTECTOMY;  Surgeon: Georganna Skeans, MD;  Location: [place];  Service: General;  Laterality: N/A;  . COLONOSCOPY WITH PROPOFOL  last one 04-10-2018    dr Loletha Carrow  . ROTATOR CUFF REPAIR Left 2007   approx.  . TRANSANAL HEMORRHOIDAL DEARTERIALIZATION N/A 05/07/2019   Procedure: TRANSANAL HEMORRHOIDAL DEARTERIALIZATION;  Surgeon: Leighton Ruff, MD;  Location: [facility];  Service: General;  Laterality: N/A;     There were no vitals filed for this visit.   Subjective Assessment - 12/08/19 0853    Subjective My left shoulder  is about a 7/10 ( points to upper trap)    Pertinent History diabetes, obesity, HTN, anxiety    Limitations Lifting;House hold activities;Standing;Walking    Currently in Pain? Yes    Pain Score 7     Pain Location Shoulder    Pain Orientation Left    Pain Descriptors / Indicators Spasm;Tightness;Sharp;Aching;Sore    Pain Type Acute pain    Pain Onset More than a month ago    Pain Frequency Constant    Pain Score 7    Pain Location Back    Pain Orientation Right;Left    Pain Descriptors / Indicators Aching;Sore;Tightness    Pain Onset More than a month ago    Pain Frequency Intermittent                             [facility] Adult PT Treatment/Exercise - 12/08/19 0001      Modalities   Modalities Moist Heat      Moist Heat Therapy   Number Minutes Moist Heat 15 Minutes    Moist Heat Location  Cervical;Lumbar Spine   LT upper trap and levatore     Manual Therapy   Manual Therapy Soft tissue mobilization    Manual therapy comments skilled palpation with TPDN    Soft tissue mobilization L upper trap and levator and and bil QL      Neck Exercises: Stretches   Upper Trapezius Stretch Left;30 seconds;3 reps    Upper Trapezius Stretch Limitations VC and TC    Levator Stretch 3 reps;Left;30 seconds    Levator Stretch Limitations VC and TC    Other Neck Stretches neck retraction x 10 10 sec hold     Other Neck Stretches supine neck retraction iwth towel roll 10 x 10 sec hold            Trigger Point Dry Needling - 12/08/19 0001    Consent Given? Yes    Education Handout Provided Yes    Muscles Treated Head and Neck Upper trapezius;Levator scapulae;Cervical multifidi   lt only   Muscles Treated Back/Hip Quadratus lumborum    Dry Needling Comments 40 mm .30 gage.  140mm .4 gage    Upper Trapezius Response Twitch reponse elicited;Palpable  increased muscle length    Levator Scapulae Response Twitch response elicited;Palpable increased muscle length    Cervical multifidi Response Palpable increased muscle length   C-2 to C4 left   Quadratus Lumborum Response Palpable increased muscle length   bil               PT Education - 12/08/19 1708    Education Details educated on TPDN after care and precautions and neck stretches    Person(s) Educated Patient    Methods Explanation;Demonstration;Tactile cues;Verbal cues;Handout    Comprehension Verbalized understanding;Returned demonstration               PT Long Term Goals - 11/24/19 1344      PT LONG TERM GOAL #1   Title Pt will be able to lift her L arm overhead with no pain for improved reaching, ADLs    Baseline moderate to severe pain    Time 6    Period Weeks    Status New    Target Date 01/08/20      PT LONG TERM GOAL #2   Title Pt will be I with HEP for cervical stab, posture, ROM, UE strength     Baseline unknown    Time 6    Period Weeks    Status New    Target Date 01/08/20      PT LONG TERM GOAL #3   Title Pt will be able to carry items waist height without increasing pain in L UE     Baseline unable to do this , pain increases    Time 6    Period Weeks    Status New    Target Date 01/08/20      PT LONG TERM GOAL #4   Title Pt will be able to walk as she needs to in the community without increased low back pain    Baseline pain can be severe 10-20 min    Time 6    Period Weeks    Status New    Target Date 01/08/20      PT LONG TERM GOAL #5   Title Pt will be able to demo pain free AROM of bilateral UEs.    Baseline pain end range all planes    Time 6    Period Weeks    Status  New    Target Date 01/08/20             Access Code: [email or web address]: [email or web address]: 10/19/2021Prepared by: Donnetta Simpers BeardsleyExercises  Supine Deep Neck Flexor Training - 2-3 x daily - 7 x weekly - 10 reps - 3 hold  Seated Cervical  Retraction Protraction AROM - 2-3 x daily - 7 x weekly - 1 sets - 10 reps - 10 hold  Seated Gentle Upper Trapezius Stretch - 1 x daily - 7 x weekly - 1 sets - 3 reps - 30 hold  Gentle Levator Scapulae Stretch - 1 x daily - 7 x weekly - 3 sets - 3 reps - 30 hold      Plan - 12/08/19 0901    Clinical Impression Statement Pt consented to TPDN and pt enters with 7/10 pain and at end of session 3/10.  Pt was closely monitored throughout session with decreased tissue tension with exercisees and moist heat at end of session.  Will continue POC    Personal Factors and Comorbidities Comorbidity 3+;Time since onset of injury/illness/exacerbation    Comorbidities obesity, HTN, diabetes, Lumbar DJD    Examination-Activity Limitations Lift;Stand;Bed Mobility;Locomotion Level;Bend;Reach Overhead;Transfers;Carry;Sit;Dressing;Stairs;Squat;Caring for Others    Examination-Participation Restrictions Cleaning;Community Activity;Interpersonal Relationship;Laundry;Shop    PT Frequency 2x / week    PT Duration 6 weeks    PT Treatment/Interventions ADLs/Self Care Home Management;Electrical Stimulation;Moist Heat;Cryotherapy;Ultrasound;Functional mobility training;Dry needling;Manual techniques;Therapeutic exercise;Patient/family education;Therapeutic activities;Neuromuscular re-education;Taping;Spinal Manipulations    PT Next Visit Plan check HEP, dry needling,manual  UBE, core as tolerated.    PT Home Exercise Plan [email or web address]    Consulted and Agree with Plan of Care Patient           Patient will benefit from skilled therapeutic intervention in order to improve the following deficits and impairments:  Increased fascial restricitons, Pain, Increased muscle spasms, Decreased mobility, Decreased range of motion, Decreased strength, Impaired flexibility, Difficulty walking, Obesity, Impaired UE functional use  Visit Diagnosis: Acute pain of both shoulders  Acute midline low back pain, unspecified whether sciatica  present  Cramp and spasm     Problem List Patient Active Problem List   Diagnosis Date Noted  . Visit for routine gyn exam 11/09/2019  . Left shoulder pain 10/29/2019  . Acute back pain 09/22/2019  . CKD (chronic kidney disease) stage 2, GFR 60-89 ml/min 08/09/2016  . Healthcare maintenance 05/17/2016  . Vitamin D deficiency 03/09/2016  . Dyslipidemia 03/02/2016  . Morbid obesity ([place]) 03/01/2016  . T2DM (type 2 diabetes mellitus) ([place]) 03/01/2016  . HTN (hypertension) 11/17/2015  . OSA (obstructive sleep apnea) 12/04/2011  . RLS (restless legs syndrome) 12/04/2011   Voncille Lo, PT, [place] Certified Exercise Expert for the Aging Adult  12/08/19 5:12 PM Phone: [phone] Fax: [facility] [facility] [address] [place], [place], [address] Phone: [phone]   Fax:  [phone]  Name: Arthurine Oleary MRN: [id number] Date of Birth: 1955/04/26

## 2019-12-10 ENCOUNTER — Encounter: Payer: Self-pay | Admitting: Physical Therapy

## 2019-12-10 ENCOUNTER — Ambulatory Visit: Payer: Medicaid Other | Admitting: Physical Therapy

## 2019-12-10 ENCOUNTER — Other Ambulatory Visit: Payer: Self-pay

## 2019-12-10 DIAGNOSIS — M25511 Pain in right shoulder: Secondary | ICD-10-CM | POA: Diagnosis not present

## 2019-12-10 DIAGNOSIS — R252 Cramp and spasm: Secondary | ICD-10-CM

## 2019-12-10 DIAGNOSIS — M25512 Pain in left shoulder: Secondary | ICD-10-CM

## 2019-12-10 DIAGNOSIS — M545 Low back pain, unspecified: Secondary | ICD-10-CM

## 2019-12-10 NOTE — Patient Instructions (Addendum)
WALKING  Walking is a great form of exercise to increase your strength, endurance and overall fitness.  A walking program can help you start slowly and gradually build endurance as you go.  Everyone's ability is different, so each person's starting point will be different.  You do not have to follow them exactly.  The are just samples. You should simply find out what's right for you and stick to that program.   In the beginning, you'll start off walking 2-3 times a day for short distances.  As you get stronger, you'll be walking further at just 1-2 times per day. You need to walk 150 minutes to 300 minutes a week   That is 5 x a week 30 to 60 minutes of walking  A. You Can Walk For A Certain Length Of Time Each Day    Walk 5 minutes 3 times per day.  Increase 2 minutes every 2 days (3 times per day).  Work up to 25-30 minutes (1-2 times per day).   Example:   Day 1-2 5 minutes 3 times per day   Day 7-8 12 minutes 2-3 times per day   Day 13-14 25 minutes 1-2 times per day  B. You Can Walk For a Certain Distance Each Day     Distance can be substituted for time.    Example:   3 trips to mailbox (at road)   3 trips to corner of block   3 trips around the block  C. Go to local [employer] and use the track.    Walk for distance ____ around track  Or time ____ minutes  D. Walk __x__ Jog ____ Run ___  Please only do the exercises that your therapist has initialed and dated       Voncille Lo, PT, [facility] Certified Exercise Expert for the Aging Adult  12/10/19 8:19 AM Phone: [phone] Fax: [phone]

## 2019-12-10 NOTE — Therapy (Signed)
[place], [place], [address] Phone: [phone]   Fax:  [phone]  Physical Therapy Treatment  Patient Details  Name: Kristen Frost MRN: [id number] Date of Birth: 1955/10/14 Referring Provider (PT): Dr. Alita Chyle    Encounter Date: 12/10/2019   PT End of Session - 12/10/19 0759    Visit Number 4    Number of Visits 12    Date for PT Re-Evaluation 01/19/20    Authorization Type UHC MCD    Authorization - Visit Number 4    Authorization - Number of Visits 27    PT Start Time 0800    PT Stop Time 0905    PT Time Calculation (min) 65 min    Activity Tolerance Patient tolerated treatment well    Behavior During Therapy [facility] for tasks assessed/performed           Past Medical History:  Diagnosis Date  . Anxiety   . Arthritis   . Bleeding hemorrhoids    grade 2  . Diverticulosis of colon   . Full dentures   . History of colon polyps   . Hyperlipidemia   . Hypertension    followed by pcp   (04-30-2019  per pt had stress test approx. 2005, was told normal (not available in epic)  . IDA (iron deficiency anemia)   . OSA on CPAP    followed by dr Halford Chessman ([place] [facility])    (04-30-2019  pt  stated uses every night)  . Restless leg syndrome   . Type 2 diabetes mellitus ([place])    followed by pcp   (04-30-2019  pt stated does not check blood sugar at home)    Past Surgical History:  Procedure Laterality Date  . CHOLECYSTECTOMY N/A 06/07/2016   Procedure: LAPAROSCOPIC CHOLECYSTECTOMY;  Surgeon: Georganna Skeans, MD;  Location: [place];  Service: General;  Laterality: N/A;  . COLONOSCOPY WITH PROPOFOL  last one 04-10-2018    dr Loletha Carrow  . ROTATOR CUFF REPAIR Left 2007   approx.  . TRANSANAL HEMORRHOIDAL DEARTERIALIZATION N/A 05/07/2019   Procedure: TRANSANAL HEMORRHOIDAL DEARTERIALIZATION;  Surgeon: Leighton Ruff, MD;  Location: [facility];  Service: General;  Laterality: N/A;     There were no vitals filed for this visit.   Subjective Assessment - 12/10/19 0806    Subjective the Dry needling did good. I was sore a lttle better and I can move my neck  better    Pertinent History diabetes, obesity, HTN, anxiety    Limitations Lifting;House hold activities;Standing;Walking    Diagnostic tests XR normal other than L4-L5    Patient Stated Goals patient wants to be pain free is possible    Currently in Pain? Yes    Pain Score 5     Pain Location Shoulder    Pain Orientation Left    Pain Descriptors / Indicators Spasm;Tightness;Sore    Pain Type Acute pain    Pain Onset More than a month ago    Pain Frequency Intermittent    Pain Score 4    Pain Location Back    Pain Orientation Right;Left    Pain Descriptors / Indicators Aching;Sore    Pain Onset More than a month ago    Pain Frequency Intermittent              [facility] PT Assessment - 12/10/19 0001      Assessment   Medical Diagnosis L shoulder, low back pain  Referring Provider (PT) Dr. Alita Chyle     Onset Date/Surgical Date 09/13/19    Hand Dominance Right      AROM   Overall AROM Comments after TPDN Cervical    Right Shoulder Flexion 155 Degrees    Left Shoulder Flexion 140 Degrees    Cervical Flexion 40   tightness   Cervical Extension 53    Cervical - Right Side Bend 35    Cervical - Left Side Bend 30    Cervical - Right Rotation 37    Cervical - Left Rotation 40   Pain on LT neck/shoulder   Lumbar Flexion hands to ankles    Lumbar Extension 50%   minimal pain   Lumbar - Right Side Bend 75% available range    Lumbar - Left Side Bend 50% available range    Lumbar - Right Rotation WFL    Lumbar - Left Rotation WFL            added back exercises to program and demo   Access Code: [email or web address]: [email or web address]: 10/21/2021Prepared by: Donnetta Simpers BeardsleyExercises   Supine Lower Trunk Rotation - 1 x daily - 7 x weekly - 1 sets - 3 reps - 20 hold  Supine  90/90 Abdominal Bracing - 1 x daily - 7 x weekly - 1 sets - 10 reps - 20 hold  Supine Bridge - 1 x daily - 7 x weekly - 3 sets - 10 reps            [facility] Adult PT Treatment/Exercise - 12/10/19 0001      Self-Care   Self-Care Other Self-Care Comments    Other Self-Care Comments  walking program  education      Lumbar Exercises: Stretches   Lower Trunk Rotation 3 reps;20 seconds    Lower Trunk Rotation Limitations Rt and LT      Lumbar Exercises: Supine   AB Set Limitations ABd bracing 90/90 20 sec hold x 8    Bridge 10 reps    Bridge Limitations 1/2 range of motion       Shoulder Exercises: Supine   Horizontal ABduction Strengthening;10 reps;Both    Theraband Level (Shoulder Horizontal ABduction) Level 2 (Red)    Horizontal ABduction Weight (lbs) x2      Shoulder Exercises: Standing   External Rotation Strengthening;Both;10 reps;Theraband    Theraband Level (Shoulder External Rotation) Level 2 (Red)    External Rotation Limitations x1    Extension 10 reps;Theraband;Strengthening;Both    Theraband Level (Shoulder Extension) Level 3 (Green)    Extension Limitations x1    Row Strengthening;10 reps;Theraband    Theraband Level (Shoulder Row) Level 3 (Green)    Row Weight (lbs) x1    Other Standing Exercises --    Other Standing Exercises OH press with LT x 2 only wiht 10 lb   bil UE OH press 10 x      Modalities   Modalities Moist Heat      Moist Heat Therapy   Moist Heat Location Cervical;Lumbar Spine   LT upper trap and levatore     Manual Therapy   Manual Therapy Soft tissue mobilization    Manual therapy comments skilled palpation with TPDN    Soft tissue mobilization L upper trap and levator and and bil QL      Neck Exercises: Stretches   Upper Trapezius Stretch Left;30 seconds;3 reps    Upper Trapezius Stretch Limitations VC and TC    Levator Stretch 3 reps;Left;30 seconds  Levator Stretch Limitations VC and TC            Trigger Point Dry Needling  - 12/10/19 0001    Consent Given? Yes    Education Handout Provided Previously provided    Muscles Treated Head and Neck Upper trapezius;Levator scapulae;Cervical multifidi   lt only   Muscles Treated Upper Quadrant Subscapularis   LT only   Dry Needling Comments 40 mm .30 gage    Upper Trapezius Response Twitch reponse elicited;Palpable increased muscle length    Levator Scapulae Response Twitch response elicited;Palpable increased muscle length    Cervical multifidi Response Twitch reponse elicited;Palpable increased muscle length   C-2 tp C-4 LT   Subscapularis Response Twitch response elicited;Palpable increased muscle length                PT Education - 12/10/19 0856    Education Details educated on walking program and given back stretch/exercises and discussed hydration    Person(s) Educated Patient    Methods Explanation;Demonstration;Tactile cues;Verbal cues;Handout    Comprehension Verbalized understanding;Returned demonstration               PT Long Term Goals - 12/10/19 6283      PT LONG TERM GOAL #1   Title Pt will be able to lift her L arm overhead with no pain for improved reaching, ADLs    Baseline Pt able to lift LT UE to 140 degrees flexion 5/10 pain    Time 6    Period Weeks    Status On-going      PT LONG TERM GOAL #2   Title Pt will be I with HEP for cervical stab, posture, ROM, UE strength     Baseline Given HEP for back and Neck/shoulder    Time 6    Period Weeks    Status On-going      PT LONG TERM GOAL #3   Title Pt will be able to carry items waist height without increasing pain in L UE     Baseline able to lift OH 10 lb 3 x and fatigues L UE only    Time 6    Period Weeks    Status On-going      PT LONG TERM GOAL #4   Title Pt will be able to walk as she needs to in the community without increased low back pain    Baseline given walking as exercise    Time 6    Period Weeks    Status On-going      PT LONG TERM GOAL #5   Title  Pt will be able to demo pain free AROM of bilateral UEs.    Baseline Pt wiht 5/10 pain in LT ue    Time 6    Period Weeks    Status On-going                 Plan - 12/10/19 1517    Clinical Impression Statement Pt felt like her neck /shoulder was better after TPDN and was able to raise LT UE to 140 degrees flex.  Pt consented to TPDN today for LT upper trap/levator/ subscapularis with marked twitch responses and decreasd tissue tension post TPDN.  Pt able to lift 10 lb OH only 3 x before fatigue. No goals achieved but able to perform HEP and progressed with back exercises and reinforced HEP.  Ms Morrision is planning to continue with a more diligent effort walking at home to achieve minimunu of  150 minute a week    Personal Factors and Comorbidities Comorbidity 3+;Time since onset of injury/illness/exacerbation    Comorbidities obesity, HTN, diabetes, Lumbar DJD    Examination-Activity Limitations Lift;Stand;Bed Mobility;Locomotion Level;Bend;Reach Overhead;Transfers;Carry;Sit;Dressing;Stairs;Squat;Caring for Others    Examination-Participation Restrictions Cleaning;Community Activity;Interpersonal Relationship;Laundry;Shop    PT Frequency 2x / week    PT Duration 6 weeks    PT Treatment/Interventions ADLs/Self Care Home Management;Electrical Stimulation;Moist Heat;Cryotherapy;Ultrasound;Functional mobility training;Dry needling;Manual techniques;Therapeutic exercise;Patient/family education;Therapeutic activities;Neuromuscular re-education;Taping;Spinal Manipulations    PT Next Visit Plan assess TPDN and ability to do [facility] press with 10 lb KB  progress back exercises    PT Home Exercise Plan [email or web address]  [email or web address]    Consulted and Agree with Plan of Care Patient           Patient will benefit from skilled therapeutic intervention in order to improve the following deficits and impairments:  Increased fascial restricitons, Pain, Increased muscle spasms, Decreased mobility, Decreased range of  motion, Decreased strength, Impaired flexibility, Difficulty walking, Obesity, Impaired UE functional use  Visit Diagnosis: Acute pain of both shoulders  Acute midline low back pain, unspecified whether sciatica present  Cramp and spasm     Problem List Patient Active Problem List   Diagnosis Date Noted  . Visit for routine gyn exam 11/09/2019  . Left shoulder pain 10/29/2019  . Acute back pain 09/22/2019  . CKD (chronic kidney disease) stage 2, GFR 60-89 ml/min 08/09/2016  . Healthcare maintenance 05/17/2016  . Vitamin D deficiency 03/09/2016  . Dyslipidemia 03/02/2016  . Morbid obesity ([place]) 03/01/2016  . T2DM (type 2 diabetes mellitus) ([place]) 03/01/2016  . HTN (hypertension) 11/17/2015  . OSA (obstructive sleep apnea) 12/04/2011  . RLS (restless legs syndrome) 12/04/2011    Voncille Lo, PT, [place] Certified Exercise Expert for the Aging Adult  12/10/19 9:01 AM Phone: [phone] Fax: [place] [facility] [address] [place], [place], [address] Phone: [phone]   Fax:  [phone]  Name: Kristen Frost MRN: [id number] Date of Birth: 09/22/1955

## 2019-12-15 ENCOUNTER — Encounter: Payer: Self-pay | Admitting: Physical Therapy

## 2019-12-15 ENCOUNTER — Other Ambulatory Visit: Payer: Self-pay

## 2019-12-15 ENCOUNTER — Ambulatory Visit: Payer: Medicaid Other | Admitting: Physical Therapy

## 2019-12-15 DIAGNOSIS — M25511 Pain in right shoulder: Secondary | ICD-10-CM | POA: Diagnosis not present

## 2019-12-15 DIAGNOSIS — M545 Low back pain, unspecified: Secondary | ICD-10-CM

## 2019-12-15 DIAGNOSIS — R252 Cramp and spasm: Secondary | ICD-10-CM

## 2019-12-15 NOTE — Therapy (Signed)
[place], [place], [address] Phone: [phone]   Fax:  [phone]  Physical Therapy Treatment  Patient Details  Name: Kristen Frost MRN: [id number] Date of Birth: 27-Aug-1955 Referring Provider (PT): Dr. Alita Chyle    Encounter Date: 12/15/2019   PT End of Session - 12/15/19 0914    Visit Number 5    Number of Visits 12    Date for PT Re-Evaluation 01/19/20    Authorization Type [employer] MCD    Authorization - Visit Number 5    Authorization - Number of Visits 27    PT Start Time 0828    PT Stop Time 0924    PT Time Calculation (min) 56 min    Activity Tolerance Patient tolerated treatment well    Behavior During Therapy [facility] for tasks assessed/performed           Past Medical History:  Diagnosis Date  . Anxiety   . Arthritis   . Bleeding hemorrhoids    grade 2  . Diverticulosis of colon   . Full dentures   . History of colon polyps   . Hyperlipidemia   . Hypertension    followed by pcp   (04-30-2019  per pt had stress test approx. 2005, was told normal (not available in epic)  . IDA (iron deficiency anemia)   . OSA on CPAP    followed by dr Halford Chessman ([facility])    (04-30-2019  pt  stated uses every night)  . Restless leg syndrome   . Type 2 diabetes mellitus ([place])    followed by pcp   (04-30-2019  pt stated does not check blood sugar at home)    Past Surgical History:  Procedure Laterality Date  . CHOLECYSTECTOMY N/A 06/07/2016   Procedure: LAPAROSCOPIC CHOLECYSTECTOMY;  Surgeon: Georganna Skeans, MD;  Location: [facility];  Service: General;  Laterality: N/A;  . COLONOSCOPY WITH PROPOFOL  last one 04-10-2018    dr Loletha Carrow  . ROTATOR CUFF REPAIR Left 2007   approx.  . TRANSANAL HEMORRHOIDAL DEARTERIALIZATION N/A 05/07/2019   Procedure: TRANSANAL HEMORRHOIDAL DEARTERIALIZATION;  Surgeon: Leighton Ruff, MD;  Location: [facility];  Service: General;  Laterality: N/A;     There were no vitals filed for this visit.   Subjective Assessment - 12/15/19 0830    Subjective Pain in shoulder and low back ever since Sat.  Cannot think of what may have increased it.  Have been doing the exercises.    Currently in Pain? Yes    Pain Score 8     Pain Location Back    Pain Orientation Mid;Lower    Pain Descriptors / Indicators Stabbing    Pain Type Chronic pain    Pain Onset More than a month ago    Pain Score 8    Pain Location Shoulder    Pain Orientation Left    Pain Descriptors / Indicators Sore    Pain Onset 1 to 4 weeks ago    Pain Frequency Intermittent                 [facility] Adult PT Treatment/Exercise - 12/15/19 0001      Neck Exercises: Supine   Neck Retraction 10 reps;5 secs    Cervical Rotation 10 reps    Cervical Rotation Limitations head on ball       Lumbar Exercises: Stretches   Single Knee to Chest Stretch 2 reps;30 seconds    Lower Trunk Rotation  10 seconds    Lower Trunk Rotation Limitations x 10 head turns     Pelvic Tilt 10 reps      Knee/Hip Exercises: Stretches   Other Knee/Hip Stretches hip ER/IR supine       Shoulder Exercises: Supine   Shoulder Flexion Weight (lbs) Pilates circle press x 10 then  overhead lift x 10 , chin tuck     Other Supine Exercises core: alternating UE flexion/ext       Moist Heat Therapy   Number Minutes Moist Heat 15 Minutes    Moist Heat Location Cervical;Lumbar Spine      Electrical Stimulation   Electrical Stimulation Location Upper back,neck     Electrical Stimulation Action IFC     Electrical Stimulation Parameters 15     Electrical Stimulation Goals Pain      Manual Therapy   Manual therapy comments compression with cervical sidebending     Soft tissue mobilization bilateral upper traps, levator scapula and into thoracic paraspinals, lower cervicals       Neck Exercises: Stretches   Upper Trapezius Stretch 2 reps;30 seconds    Levator Stretch 2 reps;30 seconds                   PT Education - 12/15/19 0914    Education Details IFC, trigger points, latent and active    Person(s) Educated Patient    Methods Explanation;Demonstration    Comprehension Verbalized understanding;Returned demonstration               PT Long Term Goals - 12/10/19 0808      PT LONG TERM GOAL #1   Title Pt will be able to lift her L arm overhead with no pain for improved reaching, ADLs    Baseline Pt able to lift LT UE to 140 degrees flexion 5/10 pain    Time 6    Period Weeks    Status On-going      PT LONG TERM GOAL #2   Title Pt will be I with HEP for cervical stab, posture, ROM, UE strength     Baseline Given HEP for back and Neck/shoulder    Time 6    Period Weeks    Status On-going      PT LONG TERM GOAL #3   Title Pt will be able to carry items waist height without increasing pain in L UE     Baseline able to lift OH 10 lb 3 x and fatigues L UE only    Time 6    Period Weeks    Status On-going      PT LONG TERM GOAL #4   Title Pt will be able to walk as she needs to in the community without increased low back pain    Baseline given walking as exercise    Time 6    Period Weeks    Status On-going      PT LONG TERM GOAL #5   Title Pt will be able to demo pain free AROM of bilateral UEs.    Baseline Pt wiht 5/10 pain in LT ue    Time 6    Period Weeks    Status On-going                 Plan - 12/15/19 0915    Clinical Impression Statement Pt with increased pain for several days.  She has been unable to do her walking program as she would have liked.  She cont to do HEP.  She was grossly sore across bilateral upper thoracic , levator scap mm. Trial of IFC for pain control. Encouraged her to stay positive and keep moving as best she can, sees MD next month and may need more diagnostics as the initial injury was in July.    PT Treatment/Interventions ADLs/Self Care Home Management;Electrical Stimulation;Moist  Heat;Cryotherapy;Ultrasound;Functional mobility training;Dry needling;Manual techniques;Therapeutic exercise;Patient/family education;Therapeutic activities;Neuromuscular re-education;Taping;Spinal Manipulations    PT Next Visit Plan FOTO. Progress core, reduce spasm and pain in upper back    PT Home Exercise Plan [id number]  [id number]    Consulted and Agree with Plan of Care Patient           Patient will benefit from skilled therapeutic intervention in order to improve the following deficits and impairments:  Increased fascial restricitons, Pain, Increased muscle spasms, Decreased mobility, Decreased range of motion, Decreased strength, Impaired flexibility, Difficulty walking, Obesity, Impaired UE functional use  Visit Diagnosis: Acute pain of both shoulders  Acute midline low back pain, unspecified whether sciatica present  Cramp and spasm     Problem List Patient Active Problem List   Diagnosis Date Noted  . Visit for routine gyn exam 11/09/2019  . Left shoulder pain 10/29/2019  . Acute back pain 09/22/2019  . CKD (chronic kidney disease) stage 2, GFR 60-89 ml/min 08/09/2016  . Healthcare maintenance 05/17/2016  . Vitamin D deficiency 03/09/2016  . Dyslipidemia 03/02/2016  . Morbid obesity ([place]) 03/01/2016  . T2DM (type 2 diabetes mellitus) ([place]) 03/01/2016  . HTN (hypertension) 11/17/2015  . OSA (obstructive sleep apnea) 12/04/2011  . RLS (restless legs syndrome) 12/04/2011    Kristen Frost 12/15/2019, 10:05 AM  [facility] [address] [place], [place], [address] Phone: [phone]   Fax:  [phone]  Name: Kristen Frost MRN: [id number] Date of Birth: 1955/10/10  Raeford Razor, PT 12/15/19 10:06 AM Phone: [phone] Fax: [phone]

## 2019-12-17 ENCOUNTER — Ambulatory Visit: Payer: Medicaid Other | Admitting: Physical Therapy

## 2019-12-17 ENCOUNTER — Encounter: Payer: Self-pay | Admitting: Physical Therapy

## 2019-12-17 ENCOUNTER — Other Ambulatory Visit: Payer: Self-pay

## 2019-12-17 DIAGNOSIS — M545 Low back pain, unspecified: Secondary | ICD-10-CM

## 2019-12-17 DIAGNOSIS — M25511 Pain in right shoulder: Secondary | ICD-10-CM

## 2019-12-17 DIAGNOSIS — M25512 Pain in left shoulder: Secondary | ICD-10-CM

## 2019-12-17 DIAGNOSIS — R252 Cramp and spasm: Secondary | ICD-10-CM

## 2019-12-17 NOTE — Therapy (Signed)
[place], [place], [address] Phone: [phone]   Fax:  [phone]  Physical Therapy Treatment  Patient Details  Name: Kristen Frost MRN: [id number] Date of Birth: August 18, 1955 Referring Provider (PT): Dr. Alita Chyle    Encounter Date: 12/17/2019   PT End of Session - 12/17/19 0840    Visit Number 6    Number of Visits 12    Date for PT Re-Evaluation 01/19/20    Authorization Type UHC MCD    Authorization - Visit Number 6    Authorization - Number of Visits 27    PT Start Time [phone]    PT Stop Time 0925    PT Time Calculation (min) 53 min    Activity Tolerance Patient tolerated treatment well    Behavior During Therapy [facility] for tasks assessed/performed           Past Medical History:  Diagnosis Date  . Anxiety   . Arthritis   . Bleeding hemorrhoids    grade 2  . Diverticulosis of colon   . Full dentures   . History of colon polyps   . Hyperlipidemia   . Hypertension    followed by pcp   (04-30-2019  per pt had stress test approx. 2005, was told normal (not available in epic)  . IDA (iron deficiency anemia)   . OSA on CPAP    followed by dr Halford Chessman ([facility])    (04-30-2019  pt  stated uses every night)  . Restless leg syndrome   . Type 2 diabetes mellitus ([place])    followed by pcp   (04-30-2019  pt stated does not check blood sugar at home)    Past Surgical History:  Procedure Laterality Date  . CHOLECYSTECTOMY N/A 06/07/2016   Procedure: LAPAROSCOPIC CHOLECYSTECTOMY;  Surgeon: Georganna Skeans, MD;  Location: [facility];  Service: General;  Laterality: N/A;  . COLONOSCOPY WITH PROPOFOL  last one 04-10-2018    dr Loletha Carrow  . ROTATOR CUFF REPAIR Left 2007   approx.  . TRANSANAL HEMORRHOIDAL DEARTERIALIZATION N/A 05/07/2019   Procedure: TRANSANAL HEMORRHOIDAL DEARTERIALIZATION;  Surgeon: Leighton Ruff, MD;  Location: [facility];  Service: General;  Laterality: N/A;     There were no vitals filed for this visit.   Subjective Assessment - 12/17/19 0837    Subjective I'm still hurting.  My Rt one is now starting to hurt now.  I took a muscle relaxer    Currently in Pain? Yes    Pain Score 8              [facility] Adult PT Treatment/Exercise - 12/17/19 0001      Neck Exercises: Supine   Neck Retraction 10 reps;5 secs      Shoulder Exercises: Supine   Horizontal ABduction Strengthening;20 reps    Theraband Level (Shoulder Horizontal ABduction) Level 2 (Red)    Horizontal ABduction Weight (lbs) cues     External Rotation Strengthening;Both;20 reps;Theraband    Theraband Level (Shoulder External Rotation) Level 2 (Red)    Shoulder Flexion Weight (lbs) overhead lift red band slight pull x 10       Shoulder Exercises: ROM/Strengthening   Nustep 6 min L5 UE and LE       Moist Heat Therapy   Number Minutes Moist Heat 15 Minutes    Moist Heat Location Cervical      Electrical Stimulation   Electrical Stimulation Location Upper back,neck     Electrical Stimulation  Action IFC     Electrical Stimulation Parameters to tolerance     Electrical Stimulation Goals Pain      Neck Exercises: Stretches   Upper Trapezius Stretch 2 reps;30 seconds    Levator Stretch 2 reps;30 seconds    Lower Cervical/Upper Thoracic Stretch 3 reps;20 seconds                       PT Long Term Goals - 12/10/19 9924      PT LONG TERM GOAL #1   Title Pt will be able to lift her L arm overhead with no pain for improved reaching, ADLs    Baseline Pt able to lift LT UE to 140 degrees flexion 5/10 pain    Time 6    Period Weeks    Status On-going      PT LONG TERM GOAL #2   Title Pt will be I with HEP for cervical stab, posture, ROM, UE strength     Baseline Given HEP for back and Neck/shoulder    Time 6    Period Weeks    Status On-going      PT LONG TERM GOAL #3   Title Pt will be able to carry items waist height without increasing pain in L UE      Baseline able to lift OH 10 lb 3 x and fatigues L UE only    Time 6    Period Weeks    Status On-going      PT LONG TERM GOAL #4   Title Pt will be able to walk as she needs to in the community without increased low back pain    Baseline given walking as exercise    Time 6    Period Weeks    Status On-going      PT LONG TERM GOAL #5   Title Pt will be able to demo pain free AROM of bilateral UEs.    Baseline Pt wiht 5/10 pain in LT ue    Time 6    Period Weeks    Status On-going                 Plan - 12/17/19 0841    Clinical Impression Statement Riddhi continues to have pain in upper back , neck.  She can relieve some pain with her HEP for neck stretching.  Repeat IFC as it did help her pain for several hours after last session.  FOTO not done on eval. Cont POC>    PT Treatment/Interventions ADLs/Self Care Home Management;Electrical Stimulation;Moist Heat;Cryotherapy;Ultrasound;Functional mobility training;Dry needling;Manual techniques;Therapeutic exercise;Patient/family education;Therapeutic activities;Neuromuscular re-education;Taping;Spinal Manipulations    PT Next Visit Plan Progress core, reduce spasm and pain in upper back    PT Home Exercise Plan [id number]    Consulted and Agree with Plan of Care Patient           Patient will benefit from skilled therapeutic intervention in order to improve the following deficits and impairments:  Increased fascial restricitons, Pain, Increased muscle spasms, Decreased mobility, Decreased range of motion, Decreased strength, Impaired flexibility, Difficulty walking, Obesity, Impaired UE functional use  Visit Diagnosis: Acute midline low back pain, unspecified whether sciatica present  Cramp and spasm  Acute pain of both shoulders     Problem List Patient Active Problem List   Diagnosis Date Noted  . Visit for routine gyn exam 11/09/2019  . Left shoulder pain 10/29/2019  . Acute back pain 09/22/2019  . CKD (chronic  kidney disease) stage 2, GFR 60-89 ml/min 08/09/2016  . Healthcare maintenance 05/17/2016  . Vitamin D deficiency 03/09/2016  . Dyslipidemia 03/02/2016  . Morbid obesity ([place]) 03/01/2016  . T2DM (type 2 diabetes mellitus) ([place]) 03/01/2016  . HTN (hypertension) 11/17/2015  . OSA (obstructive sleep apnea) 12/04/2011  . RLS (restless legs syndrome) 12/04/2011    Deondrae Mcgrail 12/17/2019, 9:15 AM  [place], [place], [address] Phone: [phone]   Fax:  [phone]  Name: Cotina Freedman MRN: [id number] Date of Birth: August 13, 1955  Raeford Razor, PT 12/17/19 9:16 AM Phone: [phone] Fax: [phone]

## 2019-12-21 ENCOUNTER — Other Ambulatory Visit: Payer: Self-pay

## 2019-12-21 ENCOUNTER — Encounter: Payer: Self-pay | Admitting: Pulmonary Disease

## 2019-12-21 ENCOUNTER — Ambulatory Visit: Payer: Medicaid Other | Admitting: Pulmonary Disease

## 2019-12-21 VITALS — BP 128/64 | HR 76 | Temp 97.3°F | Ht 61.5 in | Wt 233.4 lb

## 2019-12-21 DIAGNOSIS — Z9989 Dependence on other enabling machines and devices: Secondary | ICD-10-CM | POA: Diagnosis not present

## 2019-12-21 DIAGNOSIS — G4733 Obstructive sleep apnea (adult) (pediatric): Secondary | ICD-10-CM | POA: Diagnosis not present

## 2019-12-21 DIAGNOSIS — E669 Obesity, unspecified: Secondary | ICD-10-CM

## 2019-12-21 DIAGNOSIS — G4730 Sleep apnea, unspecified: Secondary | ICD-10-CM

## 2019-12-21 NOTE — Progress Notes (Signed)
[name]  Chief Complaint  Patient presents with  . Follow-up    CPAP-loosing wt. so mask readjusted a lot,using avg. 7 hrs. each night    Constitutional:  BP 128/64 (BP Location: Left Arm, Cuff Size: Large)   Pulse 76   Temp (!) 97.3 F (36.3 C) (Temporal)   Ht 5' 1.5" (1.562 m)   Wt 233 lb 6.4 oz (105.9 kg)   SpO2 99%   BMI 43.39 kg/m   Past Medical History:  Arthritis, RLS, Anemia, Cholelithiasis, HTN, DM  Past Surgical History:  Her  has a past surgical history that includes Rotator cuff repair (Left, 2007   approx.); Cholecystectomy (N/A, 06/07/2016); Colonoscopy with propofol (last one 04-10-2018    dr Loletha Carrow); and Transanal hemorrhoidal dearterialization (N/A, 05/07/2019).  Brief Summary:  Kristen Frost is a 64 y.o. female with obstructive sleep apnea.      Subjective:   She weighed 249 lbs at her appointment in December 2020.  She is now down to 233 lbs.  She has been working with [employer].  She realized it was time she needed to take care of herself so should could better help others.  She uses CPAP nightly.  Her mask is now too big.  She isn't having sinus congestion, sore throat, dry mouth, or aerophagia.  Physical Exam:   Appearance - well kempt   ENMT - no sinus tenderness, no oral exudate, no LAN, Mallampati 2 airway, no stridor, wears dentures  Respiratory - equal breath sounds bilaterally, no wheezing or rales  CV - s1s2 regular rate and rhythm, no murmurs  Ext - no clubbing, no edema  Skin - no rashes  Psych - normal mood and affect   Sleep Tests:   PSG 08/20/09 >> AHI 26  PSG 04/25/16 >>AHI 16.4, SpO2 low 88%. CPAP 9 cm H2O  Auto CPAP 11/18/19 to 12/17/19 >> used on 28 of 30 nights with average 7 hrs 20 min.  Average AHI 0.4 with median CPAP 9 and 95 th percentile CPAP 11 cm H2O.  Some air leak.  Social History:  She  reports that she has never smoked. She has never used smokeless  tobacco. She reports that she does not drink alcohol and does not use drugs.  Family History:  Her family history includes Alcohol abuse in her father; Allergies in her father and another family member; Asthma in an other family member; Gout in an other family member; Hypertension in an other family member; Migraines in an other family member; Rashes / Skin problems in an other family member; Tuberculosis in her father.     Assessment/Plan:   Obstructive sleep apnea. - she is compliant with CPAP and reports benefit - uses [facility] for her DME - continue auto CPAP 5 to 15 cm H2O - will have her DME refit her CPAP mask  Obesity. - she is hopeful to get her weight under 200 lbs - congratulated her on her progress, and encouraged her to keep up with her weight loss regimen - once she gets to her target weight we could then re-assess status of her sleep apnea with a home sleep study to determine whether she still would need CPAP  Time Spent Involved in Patient Care on Day of Examination:  22 minutes  Follow up:  Patient Instructions  Will have [facility] refit your CPAP mask  Follow up in 1 year   Medication List:   Allergies as of 12/21/2019  No Known Allergies     Medication List       Accurate as of December 21, 2019 10:14 AM. If you have any questions, ask your nurse or doctor.        STOP taking these medications   lidocaine 5 % Commonly known as: Lidoderm Stopped by: Chesley Mires, MD   oxyCODONE 5 MG immediate release tablet Commonly known as: Oxy IR/ROXICODONE Stopped by: Chesley Mires, MD     TAKE these medications   amLODipine 10 MG tablet Commonly known as: NORVASC TAKE 1 TABLET BY MOUTH DAILY   aspirin EC 81 MG tablet Take 81 mg by mouth daily.   atorvastatin 40 MG tablet Commonly known as: Lipitor Take 1 tablet (40 mg total) by mouth daily.   ferrous sulfate 325 (65 FE) MG tablet Take 1 tablet (325 mg total) by mouth daily with breakfast.     fluticasone 50 MCG/ACT nasal spray Commonly known as: FLONASE Place 1 spray into both nostrils daily. What changed:   when to take this  reasons to take this   liraglutide 18 MG/3ML Sopn Commonly known as: VICTOZA Inject 0.3 mLs (1.8 mg total) into the skin daily.   metFORMIN 500 MG 24 hr tablet Commonly known as: GLUCOPHAGE-XR Take 2 tablets (1,000 mg total) by mouth daily with breakfast.   methocarbamol 500 MG tablet Commonly known as: ROBAXIN Take 1 tablet (500 mg total) by mouth 2 (two) times daily.   Pen Needles 31G X 5 MM Misc 1 application by Does not apply route daily.   Vitamin D 50 MCG (2000 UT) Caps Take 1 capsule (2,000 Units total) by mouth daily.       Signature:  Chesley Mires, MD [place] Pager - [phone] 12/21/2019, 10:14 AM

## 2019-12-21 NOTE — Patient Instructions (Signed)
Will have [facility] refit your CPAP mask ? ?Follow up in 1 year ?

## 2019-12-22 ENCOUNTER — Ambulatory Visit: Payer: Medicaid Other | Attending: Internal Medicine | Admitting: Physical Therapy

## 2019-12-22 ENCOUNTER — Encounter: Payer: Self-pay | Admitting: Physical Therapy

## 2019-12-22 DIAGNOSIS — M25511 Pain in right shoulder: Secondary | ICD-10-CM | POA: Insufficient documentation

## 2019-12-22 DIAGNOSIS — M545 Low back pain, unspecified: Secondary | ICD-10-CM | POA: Insufficient documentation

## 2019-12-22 DIAGNOSIS — R252 Cramp and spasm: Secondary | ICD-10-CM | POA: Diagnosis present

## 2019-12-22 DIAGNOSIS — M25512 Pain in left shoulder: Secondary | ICD-10-CM | POA: Insufficient documentation

## 2019-12-22 NOTE — Therapy (Signed)
[place], [place], [address] Phone: [phone]   Fax:  [phone]  Physical Therapy Treatment  Patient Details  Name: Kristen Frost MRN: [id number] Date of Birth: [birth year] Referring Provider (PT): Dr. Alita Chyle    Encounter Date: 12/22/2019   PT End of Session - 12/22/19 0800    Visit Number 7    Number of Visits 12    Date for PT Re-Evaluation 01/19/20    Authorization Type UHC MCD    Authorization - Visit Number 7    Authorization - Number of Visits 27    PT Start Time 0801    PT Stop Time 0859    PT Time Calculation (min) 58 min    Activity Tolerance Patient limited by pain    Behavior During Therapy [facility] for tasks assessed/performed           Past Medical History:  Diagnosis Date  . Anxiety   . Arthritis   . Bleeding hemorrhoids    grade 2  . Diverticulosis of colon   . Full dentures   . History of colon polyps   . Hyperlipidemia   . Hypertension    followed by pcp   (04-30-2019  per pt had stress test approx. 2005, was told normal (not available in epic)  . IDA (iron deficiency anemia)   . OSA on CPAP    followed by dr Halford Chessman ([place] [facility])    (04-30-2019  pt  stated uses every night)  . Restless leg syndrome   . Type 2 diabetes mellitus ([place])    followed by pcp   (04-30-2019  pt stated does not check blood sugar at home)    Past Surgical History:  Procedure Laterality Date  . CHOLECYSTECTOMY N/A 06/07/2016   Procedure: LAPAROSCOPIC CHOLECYSTECTOMY;  Surgeon: Georganna Skeans, MD;  Location: [place];  Service: General;  Laterality: N/A;  . COLONOSCOPY WITH PROPOFOL  last one 04-10-2018    dr Loletha Carrow  . ROTATOR CUFF REPAIR Left 2007   approx.  . TRANSANAL HEMORRHOIDAL DEARTERIALIZATION N/A 05/07/2019   Procedure: TRANSANAL HEMORRHOIDAL DEARTERIALIZATION;  Surgeon: Leighton Ruff, MD;  Location: [facility];  Service: General;  Laterality: N/A;    There were  no vitals filed for this visit.   Subjective Assessment - 12/22/19 0804    Subjective I am still hurting on my LT shoulder  a bad ache and sometimes a sharp pain in my LT shoulder.  I think the needling did help with the pain.   I have been doing the exercises.  but the pain is still there  I am not doing anything strenuous.    Pertinent History diabetes, obesity, HTN, anxiety    Limitations Lifting;House hold activities;Standing;Walking    Patient Stated Goals patient wants to be pain free is possible    Currently in Pain? Yes    Pain Score 6     Pain Location Back    Pain Orientation Lower;Mid    Pain Descriptors / Indicators Stabbing    Pain Type Chronic pain    Pain Onset More than a month ago    Pain Score 8    Pain Location Shoulder    Pain Orientation Left    Pain Descriptors / Indicators Sore    Pain Onset More than a month ago    Pain Frequency Constant  [facility] Adult PT Treatment/Exercise - 12/22/19 0001      Neck Exercises: Supine   Neck Retraction 10 reps;5 secs    Other Supine Exercise thoracic sidelying stretch book opening 5 x each side       Lumbar Exercises: Aerobic   Nustep --      Lumbar Exercises: Supine   Bridge 10 reps;3 seconds    Bridge Limitations got a cramp after 3 reps but told to stretch out legs and continue      Shoulder Exercises: Supine   Horizontal ABduction Strengthening;20 reps    Theraband Level (Shoulder Horizontal ABduction) Level 2 (Red)    Horizontal ABduction Weight (lbs) cues     External Rotation Strengthening;Both;20 reps;Theraband    Theraband Level (Shoulder External Rotation) Level 2 (Red)    External Rotation Weight (lbs) cues    Shoulder Flexion Weight (lbs) overhead lift red band slight pull x 12    Other Supine Exercises core: alternating UE flexion/ext  with 90/90 abdominal bracing      Shoulder Exercises: Standing   Extension Strengthening;Both;12 reps;Theraband     Theraband Level (Shoulder Extension) Level 2 (Red)    Extension Limitations cues    Row Strengthening;Both;12 reps;Theraband    Theraband Level (Shoulder Row) Level 2 (Red)    Row Limitations cues      Shoulder Exercises: ROM/Strengthening   Nustep 6 min L5 UE and LE RPE 5/6      Moist Heat Therapy   Number Minutes Moist Heat 15 Minutes    Moist Heat Location Cervical;Shoulder   LEFT     Electrical Stimulation   Electrical Stimulation Location Upper back,neck     Electrical Stimulation Action IFC    Electrical Stimulation Parameters to tolerance    Electrical Stimulation Goals Pain      Manual Therapy   Manual Therapy Soft tissue mobilization    Manual therapy comments skilled palpation with TPDN    Soft tissue mobilization L upper trap and levator . subscap, rhomboids      Neck Exercises: Stretches   Upper Trapezius Stretch 2 reps;30 seconds    Levator Stretch 2 reps;30 seconds            Trigger Point Dry Needling - 12/22/19 0001    Consent Given? Yes    Education Handout Provided Previously provided    Muscles Treated Head and Neck Upper trapezius;Levator scapulae   left side only   Muscles Treated Upper Quadrant Subscapularis;Rhomboids    Muscles Treated Back/Hip Thoracic multifidi    Dry Needling Comments 40 mm .30 gage    Upper Trapezius Response Twitch reponse elicited;Palpable increased muscle length    Levator Scapulae Response Twitch response elicited;Palpable increased muscle length    Rhomboids Response Twitch response elicited;Palpable increased muscle length    Subscapularis Response Twitch response elicited;Palpable increased muscle length    Thoracic multifidi response Twitch response elicited;Palpable increased muscle length                     PT Long Term Goals - 12/10/19 2505      PT LONG TERM GOAL #1   Title Pt will be able to lift her L arm overhead with no pain for improved reaching, ADLs    Baseline Pt able to lift LT UE to 140  degrees flexion 5/10 pain    Time 6    Period Weeks    Status On-going      PT LONG TERM GOAL #2  Title Pt will be I with HEP for cervical stab, posture, ROM, UE strength     Baseline Given HEP for back and Neck/shoulder    Time 6    Period Weeks    Status On-going      PT LONG TERM GOAL #3   Title Pt will be able to carry items waist height without increasing pain in L UE     Baseline able to lift OH 10 lb 3 x and fatigues L UE only    Time 6    Period Weeks    Status On-going      PT LONG TERM GOAL #4   Title Pt will be able to walk as she needs to in the community without increased low back pain    Baseline given walking as exercise    Time 6    Period Weeks    Status On-going      PT LONG TERM GOAL #5   Title Pt will be able to demo pain free AROM of bilateral UEs.    Baseline Pt wiht 5/10 pain in LT ue    Time 6    Period Weeks    Status On-going                 Plan - 12/22/19 0829    Clinical Impression Statement Ms Grefe back improved to 6/10 pain but LT shld continues to be 8/10 pain and has constant sharp pain this past weekend.  Pt consents and requests TPDN and has 5/10 pain after TPDN and manual.  Pt states she continues to do all exercises but she does have pain that seems to continue.  Pt reinforced HEP for upper back and shoulder this visit. Doing supine shld and tried standing bil shld and rows.   Ms Back has one more visit scheduled to reinforce HEP and if pain levels continue is advised to return to MD.    Personal Factors and Comorbidities Comorbidity 3+;Time since onset of injury/illness/exacerbation    Comorbidities obesity, HTN, diabetes, Lumbar DJD    Examination-Activity Limitations Lift;Stand;Bed Mobility;Locomotion Level;Bend;Reach Overhead;Transfers;Carry;Sit;Dressing;Stairs;Squat;Caring for Others    Examination-Participation Restrictions Cleaning;Community Activity;Interpersonal Relationship;Laundry;Shop    PT  Treatment/Interventions ADLs/Self Care Home Management;Electrical Stimulation;Moist Heat;Cryotherapy;Ultrasound;Functional mobility training;Dry needling;Manual techniques;Therapeutic exercise;Patient/family education;Therapeutic activities;Neuromuscular re-education;Taping;Spinal Manipulations    PT Next Visit Plan Progress core, reduce spasm and pain in upper back add standing bil shld ext and rows to HEP as able Include information on TENS next visit    PT [place] and Agree with Plan of Care Patient           Patient will benefit from skilled therapeutic intervention in order to improve the following deficits and impairments:  Increased fascial restricitons, Pain, Increased muscle spasms, Decreased mobility, Decreased range of motion, Decreased strength, Impaired flexibility, Difficulty walking, Obesity, Impaired UE functional use  Visit Diagnosis: Acute midline low back pain, unspecified whether sciatica present  Cramp and spasm  Acute pain of both shoulders     Problem List Patient Active Problem List   Diagnosis Date Noted  . Visit for routine gyn exam 11/09/2019  . Left shoulder pain 10/29/2019  . Acute back pain 09/22/2019  . CKD (chronic kidney disease) stage 2, GFR 60-89 ml/min 08/09/2016  . Healthcare maintenance 05/17/2016  . Vitamin D deficiency 03/09/2016  . Dyslipidemia 03/02/2016  . Morbid obesity ([place]) 03/01/2016  . T2DM (type 2 diabetes mellitus) ([place]) 03/01/2016  . HTN (hypertension) 11/17/2015  .  OSA (obstructive sleep apnea) 12/04/2011  . RLS (restless legs syndrome) 12/04/2011    Voncille Lo, PT, [place] Certified Exercise Expert for the Aging Adult  12/22/19 8:48 AM Phone: [phone] Fax: [facility] [address] [place], [place], [address] Phone: [phone]   Fax:  [phone]  Name: Leyani Gargus MRN: [id number] Date of Birth:  17-Nov-1955

## 2019-12-24 ENCOUNTER — Encounter: Payer: Self-pay | Admitting: Physical Therapy

## 2019-12-24 ENCOUNTER — Ambulatory Visit: Payer: Medicaid Other | Admitting: Physical Therapy

## 2019-12-24 ENCOUNTER — Other Ambulatory Visit: Payer: Self-pay

## 2019-12-24 DIAGNOSIS — M25512 Pain in left shoulder: Secondary | ICD-10-CM

## 2019-12-24 DIAGNOSIS — M545 Low back pain, unspecified: Secondary | ICD-10-CM

## 2019-12-24 DIAGNOSIS — R252 Cramp and spasm: Secondary | ICD-10-CM

## 2019-12-24 NOTE — Patient Instructions (Signed)

## 2019-12-24 NOTE — Therapy (Signed)
[place], [place], [address] Phone: [phone]   Fax:  [phone]  Physical Therapy Treatment  Patient Details  Name: Kristen Frost MRN: [id number] Date of Birth: 12/07/1955 Referring Provider (PT): Dr. Alita Chyle    Encounter Date: 12/24/2019   PT End of Session - 12/24/19 0849    Visit Number 8    Number of Visits 12    Date for PT Re-Evaluation 01/19/20    Authorization Type UHC MCD    Authorization - Visit Number 8    Authorization - Number of Visits 27    PT Start Time [phone]    PT Stop Time 0930    PT Time Calculation (min) 55 min    Activity Tolerance Patient tolerated treatment well    Behavior During Therapy [facility] for tasks assessed/performed           Past Medical History:  Diagnosis Date   Anxiety    Arthritis    Bleeding hemorrhoids    grade 2   Diverticulosis of colon    Full dentures    History of colon polyps    Hyperlipidemia    Hypertension    followed by pcp   (04-30-2019  per pt had stress test approx. 2005, was told normal (not available in epic)   IDA (iron deficiency anemia)    OSA on CPAP    followed by dr Halford Chessman ([place] [facility])    (04-30-2019  pt  stated uses every night)   Restless leg syndrome    Type 2 diabetes mellitus ([place])    followed by pcp   (04-30-2019  pt stated does not check blood sugar at home)    Past Surgical History:  Procedure Laterality Date   CHOLECYSTECTOMY N/A 06/07/2016   Procedure: LAPAROSCOPIC CHOLECYSTECTOMY;  Surgeon: Georganna Skeans, MD;  Location: [place];  Service: General;  Laterality: N/A;   COLONOSCOPY WITH PROPOFOL  last one 04-10-2018    dr Loletha Carrow   ROTATOR CUFF REPAIR Left 2007   approx.   TRANSANAL HEMORRHOIDAL DEARTERIALIZATION N/A 05/07/2019   Procedure: TRANSANAL HEMORRHOIDAL DEARTERIALIZATION;  Surgeon: Leighton Ruff, MD;  Location: [facility];  Service: General;  Laterality: N/A;     There were no vitals filed for this visit.   Subjective Assessment - 12/24/19 0836    Subjective I feel so much better.  The needling really helped.    Currently in Pain? Yes    Pain Score 5     Pain Location Scapula    Pain Orientation Left    Pain Descriptors / Indicators Sore;Other (Comment)   pinch   Pain Type Chronic pain              [facility] PT Assessment - 12/24/19 0001      AROM   Right Shoulder Flexion 155 Degrees    Left Shoulder Flexion 155 Degrees    Cervical Flexion 65    Cervical Extension 75    Cervical - Right Side Bend 40    Cervical - Left Side Bend 52    Cervical - Right Rotation WNL    Cervical - Left Rotation WNL, min pain                 [facility] Adult PT Treatment/Exercise - 12/24/19 0001      Self-Care   Other Self-Care Comments  tennis ballin supine and standing for trigger point release       Lumbar Exercises: Stretches  Lower Trunk Rotation 10 seconds    Lower Trunk Rotation Limitations x 10 head turns     Other Lumbar Stretch Exercise upper trunk sidelying x 5 each       Lumbar Exercises: Aerobic   Nustep 5 min L5 UE and LE       Shoulder Exercises: Supine   Protraction Weight (lbs) bilateral x 10 with tennis ball     Horizontal ABduction Weight (lbs) single arm with tennis bal x 10     Shoulder Flexion Weight (lbs) single arm with tennis ball under L rhomboid  x10       Moist Heat Therapy   Number Minutes Moist Heat 15 Minutes    Moist Heat Location Cervical;Shoulder   LEFT     Electrical Stimulation   Electrical Stimulation Location Upper back,neck     Electrical Stimulation Action IFC    Electrical Stimulation Parameters 29 V    Electrical Stimulation Goals Pain                  PT Education - 12/24/19 1833    Education Details TENS unit, Tennis ball for self release trigger points , progress    Person(s) Educated Patient    Methods Explanation;Demonstration;Handout    Comprehension Verbalized  understanding;Returned demonstration               PT Long Term Goals - 12/24/19 0850      PT LONG TERM GOAL #1   Title Pt will be able to lift her L arm overhead with no pain for improved reaching, ADLs    Baseline 155 deg      PT LONG TERM GOAL #2   Title Pt will be I with HEP for cervical stab, posture, ROM, UE strength     Status On-going      PT LONG TERM GOAL #3   Title Pt will be able to carry items waist height without increasing pain in L UE     Status Partially Met      PT LONG TERM GOAL #4   Title Pt will be able to walk as she needs to in the community without increased low back pain    Baseline has not done this lately    Status On-going      PT LONG TERM GOAL #5   Title Pt will be able to demo pain free AROM of bilateral UEs.    Baseline no increased pain    Status Achieved                 Plan - 12/24/19 0859    Clinical Impression Statement Patient reports iprovement with manual Tr P Dry needling and stretching.  Her low back pain is nearly resolved unless she is walking > 20 min .  She was given info on self care for home pain control.  She will finish POC and she is improving her ROM and strength despite her high pain levels.    Personal Factors and Comorbidities Comorbidity 3+;Time since onset of injury/illness/exacerbation    PT Treatment/Interventions ADLs/Self Care Home Management;Electrical Stimulation;Moist Heat;Cryotherapy;Ultrasound;Functional mobility training;Dry needling;Manual techniques;Therapeutic exercise;Patient/family education;Therapeutic activities;Neuromuscular re-education;Taping;Spinal Manipulations    PT Next Visit Plan Progress core, reduce spasm and pain in upper back, shoulder    PT Home Exercise Plan [id number]    Consulted and Agree with Plan of Care Patient           Patient will benefit from skilled therapeutic intervention in order to improve  the following deficits and impairments:  Increased fascial restricitons,  Pain, Increased muscle spasms, Decreased mobility, Decreased range of motion, Decreased strength, Impaired flexibility, Difficulty walking, Obesity, Impaired UE functional use ° °Visit Diagnosis: °Acute midline low back pain, unspecified whether sciatica present ° °Cramp and spasm ° °Acute pain of both shoulders ° ° ° ° °Problem List °Patient Active Problem List  ° Diagnosis Date Noted  °• Visit for routine gyn exam 11/09/2019  °• Left shoulder pain 10/29/2019  °• Acute back pain 09/22/2019  °• CKD (chronic kidney disease) stage 2, GFR 60-89 ml/min 08/09/2016  °• Healthcare maintenance 05/17/2016  °• Vitamin D deficiency 03/09/2016  °• Dyslipidemia 03/02/2016  °• Morbid obesity ([facility]) 03/01/2016  °• T2DM (type 2 diabetes mellitus) ([facility]) 03/01/2016  °• HTN (hypertension) 11/17/2015  °• OSA (obstructive sleep apnea) 12/04/2011  °• RLS (restless legs syndrome) 12/04/2011  ° ° °PAA,JENNIFER °12/24/2019, 6:40 PM ° °[place] °[facility] °[address] °[place], [place], [address] °Phone: [phone]   Fax:  [phone] ° °Name: Tenika Drye Swaim °MRN: [id number] °Date of Birth: 09/19/1955 ° °Jennifer Paa, PT °12/24/19 6:41 PM °Phone: [phone] °Fax: [phone] ° °

## 2020-01-05 ENCOUNTER — Ambulatory Visit: Payer: Medicaid Other | Admitting: *Deleted

## 2020-01-05 DIAGNOSIS — I10 Essential (primary) hypertension: Secondary | ICD-10-CM

## 2020-01-05 DIAGNOSIS — E785 Hyperlipidemia, unspecified: Secondary | ICD-10-CM

## 2020-01-05 DIAGNOSIS — F419 Anxiety disorder, unspecified: Secondary | ICD-10-CM

## 2020-01-05 DIAGNOSIS — E119 Type 2 diabetes mellitus without complications: Secondary | ICD-10-CM

## 2020-01-05 DIAGNOSIS — N182 Chronic kidney disease, stage 2 (mild): Secondary | ICD-10-CM

## 2020-01-05 NOTE — Patient Instructions (Signed)
Visit Information It was nice speaking with you today. Goals Addressed              This Visit's Progress     Patient Stated   .  "I want to continue to lose weight and maintain it and keep my blood sugar in good control." (pt-stated)        CARE PLAN ENTRY (see longitudinal plan of care for additional care plan information)  Current Barriers:  . Chronic Disease Management support and education needs related to DM, HTN, HLD CKD and obesity- spoke with patient to complete follow up assessment, she says she continues to lose weight via [employer] and participates in face to face weigh in every week, she says she does not check her blood sugar at home and rarely check her blood pressure because her readings have met target with her ongoing weight loss, she reports good medication taking behavior, says she continues with outpatient physical therapy at the [facility] for her left shoulder pain and back pain and the therapist there have recommended a home TENS unit. Patient asked if Medicaid covers a home TENS unit . She says she will make a clinic appointment to discuss the TENS unit and to meet the Medicaid prior approval guidelines of a face to face assessment with the required documentation, she does not want to treat her chronic pain with opioids and feels the TENS unit will help her manage the pain since she has received the therapy at the [facility] clinic. Reports that she saw Dr. Sood earlier this month in follow up for her OSA and he was going to ask [facility] to reach out to her about adjusting her face mask since it no longer fits due to her weight loss.   Nurse Case Manager Clinical Goal(s): . Over the next 30-60 days, the patient will demonstrate ongoing self health care management ability as evidenced by meeting targets for Hgb A1C and Blood pressure and ongoing weight loss or no weight gain  Interventions:  . Inter-disciplinary care team collaboration (see longitudinal plan of  care) . Evaluation of current treatment plan related to HTN, DM and obesity and patient's adherence to plan as established by provider. . Provided opportunity for patient to ask questions . Reviewed medications with patient and discussed medication taking behavior  . Provided positive feedback for patient's ongoing weight loss and well managed chronic health issues . Encouraged her to call Dr Sood's office since [employer] has not contacted her about refitting her CPAP mask. . Discussed need for home TENS unit prior approval per Medicaid guidelines.  Encouraged her to make a clinic appointment for face to face assessment for home TENS unit and then notify this [name] RN of the appointment date so the required documentation items can be given to provider seeing her that day.  Patient has appointment on 01/06/20 to discuss TENS unit with clinic provider. . Discussed plans with patient for ongoing care management follow up and provided patient with direct contact information for care management team . Advised patient, providing education and rationale, to monitor blood pressure daily and record, calling clinic provider for findings outside established parameters.   Patient Self Care Activities:  . Patient verbalizes understanding of plan to work with [name] RN to improve chronic disease self management skills . Self administers medications as prescribed . Attends all scheduled provider appointments . Calls pharmacy for medication refills . Performs ADL's independently . Performs IADL's independently . Calls provider office for new   concerns or questions   Please see past updates related to this goal by clicking on the "Past Updates" button in the selected goal      .  Blood Pressure < 130/80 (pt-stated)        BP Readings from Last 3 Encounters:  12/21/19 128/64  11/09/19 137/75  10/29/19 (!) 142/79   Meeting treatment targets for blood pressure. .       Other   .  HEMOGLOBIN A1C < 7        Lab  Results  Component Value Date   HGBA1C 6.0 (A) 09/22/2019   Meeting Hgb A1C target       The patient verbalized understanding of instructions, educational materials, and care plan provided today and agreed to receive a mailed copy of patient instructions, educational materials, and care plan.   The care management team will reach out to the patient again over the next 30-60 days.   Janet Hauser RN, [name], CDCES [name] Clinic RN Care Manager [phone]  

## 2020-01-05 NOTE — Chronic Care Management (AMB) (Signed)
Care Management   Follow Up Note   01/05/2020 Name: Jazelle Achey MRN: [id number] DOB: August 09, 1955  Nicholes Calamity Goldsmith is enrolled in a Managed Medicaid plan: Yes. Outreach attempt today was successful.    Referred by: [name], MD Reason for referral : Care Coordination (HTN, NIDDM, HLD, CKD)   Sarinah Doetsch is a 64 y.o. year old female who is a primary care patient of [name], [name], MD. The care management team was consulted for assistance with care management and care coordination needs.    Review of patient status, including review of consultants reports, relevant laboratory and other test results, and collaboration with appropriate care team members and the patient's provider was performed as part of comprehensive patient evaluation and provision of chronic care management services.    Wt Readings from Last 3 Encounters:  12/21/19 233 lb 6.4 oz (105.9 kg)  11/09/19 236 lb (107 kg)  10/29/19 238 lb 3.2 oz (108 kg)    Goals Addressed              This Visit's Progress     Patient Stated   .  "I want to continue to lose weight and maintain it and keep my blood sugar in good control." (pt-stated)        CARE PLAN ENTRY (see longitudinal plan of care for additional care plan information)  Current Barriers:  . Chronic Disease Management support and education needs related to DM, HTN, HLD CKD and obesity- spoke with patient to complete follow up assessment, she says she continues to lose weight via [employer] and participates in face to face weigh in every week, she says she does not check her blood sugar at home and rarely check her blood pressure because her readings have met target with her ongoing weight loss, she reports good medication taking behavior, says she continues with outpatient physical therapy at the [facility] clinic for her left shoulder pain and back pain and the therapist there have recommended a home TENS unit.  Patient asked if Medicaid covers a home TENS unit . She says she will make a clinic appointment to discuss the TENS unit and to meet the Medicaid prior approval guidelines of a face to face assessment with the required documentation, she does not want to treat her chronic pain with opioids and feels the TENS unit will help her manage the pain since she has received the therapy at the [facility] clinic. Reports that she saw Dr. Halford Chessman earlier this month in follow up for her OSA and he was going to ask [employer] to reach out to her about adjusting her face mask since it no longer fits due to her weight loss.   Nurse Case Manager Clinical Goal(s): Marland Kitchen Over the next 30-60 days, the patient will demonstrate ongoing self health care management ability as evidenced by meeting targets for Hgb A1C and Blood pressure and ongoing weight loss or no weight gain  Interventions:  . Inter-disciplinary care team collaboration (see longitudinal plan of care) . Evaluation of current treatment plan related to HTN, DM and obesity and patient's adherence to plan as established by provider. . Provided opportunity for patient to ask questions . Reviewed medications with patient and discussed medication taking behavior  . Provided positive feedback for patient's ongoing weight loss and well managed chronic health issues . Encouraged her to call Dr Juanetta Gosling office since [facility] has not contacted her about refitting her CPAP mask. . Discussed need for home TENS unit prior  approval per Medicaid guidelines.  Encouraged her to make a clinic appointment for face to face assessment for home TENS unit and then notify this [name] RN of the appointment date so the required documentation items can be given to provider seeing her that day.  Patient has appointment on 01/06/20 to discuss TENS unit with clinic provider. . Discussed plans with patient for ongoing care management follow up and provided patient with direct contact information for care management  team . Advised patient, providing education and rationale, to monitor blood pressure daily and record, calling clinic provider for findings outside established parameters.   Patient Self Care Activities:  . Patient verbalizes understanding of plan to work with [name] RN to improve chronic disease self management skills . Self administers medications as prescribed . Attends all scheduled provider appointments . Calls pharmacy for medication refills . Performs ADL's independently . Performs IADL's independently . Calls provider office for new concerns or questions   Please see past updates related to this goal by clicking on the "Past Updates" button in the selected goal      .  Blood Pressure < 130/80 (pt-stated)        BP Readings from Last 3 Encounters:  12/21/19 128/64  11/09/19 137/75  10/29/19 (!) 142/79   Meeting treatment targets for blood pressure. .       Other   .  HEMOGLOBIN A1C < 7        Lab Results  Component Value Date   HGBA1C 6.0 (A) 09/22/2019   Meeting Hgb A1C target        The care management team will reach out to the patient again over the next 30-60 days.   [name], [name], [facility] RN Care Manager [phone]

## 2020-01-06 ENCOUNTER — Ambulatory Visit: Payer: Medicaid Other | Admitting: *Deleted

## 2020-01-06 ENCOUNTER — Ambulatory Visit (INDEPENDENT_AMBULATORY_CARE_PROVIDER_SITE_OTHER): Payer: Medicaid Other | Admitting: Student

## 2020-01-06 ENCOUNTER — Encounter: Payer: Self-pay | Admitting: Student

## 2020-01-06 ENCOUNTER — Other Ambulatory Visit: Payer: Self-pay

## 2020-01-06 DIAGNOSIS — E119 Type 2 diabetes mellitus without complications: Secondary | ICD-10-CM

## 2020-01-06 DIAGNOSIS — M25551 Pain in right hip: Secondary | ICD-10-CM

## 2020-01-06 DIAGNOSIS — M5489 Other dorsalgia: Secondary | ICD-10-CM

## 2020-01-06 DIAGNOSIS — M549 Dorsalgia, unspecified: Secondary | ICD-10-CM

## 2020-01-06 DIAGNOSIS — E785 Hyperlipidemia, unspecified: Secondary | ICD-10-CM

## 2020-01-06 DIAGNOSIS — I10 Essential (primary) hypertension: Secondary | ICD-10-CM

## 2020-01-06 DIAGNOSIS — N182 Chronic kidney disease, stage 2 (mild): Secondary | ICD-10-CM

## 2020-01-06 DIAGNOSIS — F419 Anxiety disorder, unspecified: Secondary | ICD-10-CM

## 2020-01-06 DIAGNOSIS — M25512 Pain in left shoulder: Secondary | ICD-10-CM

## 2020-01-06 MED ORDER — METFORMIN HCL ER 500 MG PO TB24: 1000.0000 mg | ORAL_TABLET | Freq: Every day | ORAL | 1 refills | Status: DC

## 2020-01-06 NOTE — Progress Notes (Signed)
Internal Medicine Clinic Attending  [name] services provided by the care management provider and their documentation were discussed with Dr. Basaraba. We reviewed the pertinent findings, urgent action items addressed by the resident and non-urgent items to be addressed by the PCP.  I agree with the assessment, diagnosis, and plan of care documented in the [name] and resident's note.  Lucindia Lemley Thomas Reine Bristow, MD 01/06/2020  

## 2020-01-06 NOTE — Chronic Care Management (AMB) (Signed)
  Care Management   Follow Up Note   01/06/2020 Name: Kristen Frost MRN: [id number] DOB: [birth year]  Kristen Frost is enrolled in a Managed Medicaid plan: Yes. Outreach attempt today was successful.    Referred by: [name], MD Reason for referral : Care Coordination (HTN, NIDDM, HLD, CKD)   Kristen Frost is a 64 y.o. year old female who is a primary care patient of [name], [name], MD. The care management team was consulted for assistance with care management and care coordination needs.    Review of patient status, including review of consultants reports, relevant laboratory and other test results, and collaboration with appropriate care team members and the patient's provider was performed as part of comprehensive patient evaluation and provision of chronic care management services.    Goals Addressed              This Visit's Progress     Patient Stated   .  'My physical therapist is recommending a home TENS unit to treat my chronic pain." (pt-stated)        CARE PLAN ENTRY (see longitudinal plan of care for additional care plan information)  Current Barriers:  . Care Coordination needs related to needed DME/Supplies in a patient with HTN, DMII, CKD Stage 2, and chronic back and shoulder pain . Care Coordination needs related to securing home TENS unit in a patient with HTN, DMII, CKD Stage 2, and chronic back and shoulder pain  Nurse Case Manager Clinical Goal(s):  Marland Kitchen Over the next 60-120 days, patient will verbalize understanding of plan to obtain needed DME.  . Over the next 60-120 days, patient will work with care guide and/or [name] to address needs related to DME needs. . Over the next 60-120 days, patient will communicate with RN Care Manager to address need for home TENS unit.  Interventions:   Inter-disciplinary care team collaboration (see longitudinal plan of care)  Collaboration with/confirmation  of receipt of DME orders at [facility] or [place] patient to make clinic appointment to discuss home TENS unit with provider  Collaborated with primary care provider regarding documentation needed for prior approval for Medicaid coverage of home TENS unit  Patient Self Care Activities:  . Patient verbalizes understanding of plan to work with clinic staff and [name] team to secure home TENS unit . Unable to independently secure home TENS unit to treat chronic pain  Initial goal documentation         The care management team will reach out to the patient again over the next 30-60 days.   [name], [name], [facility] RN Care Manager [phone]

## 2020-01-06 NOTE — Assessment & Plan Note (Signed)
Assessment: Patient continuing to have left sided back pain that has worsened over the past few months. Pain appears to be MSK in nature; improves with massages, rolling softball on back, TENS unit. Does not improve with tylenol, lidocaine patches and muscle relaxers. Has minimal relief with heat and direct pressure as well as exercise.   Believe patient's muscukloskeletal strain would benefit from TENs unit. The onset was 09/13/19 and has been mostly consistent since then. The pain is not exacerbated by activity and may occur at rest. The specific area is to the left of T8-T10. Area appears stable. No success with conservative management of medications such as tylenol, lidocaine, or muscle relaxers. Has not tried NSAIDS due to history of CKD. Has not improved with physical therapy. Patient notes pain has improved most with use of TENS unit. Please see physical exam in note for exact location of pain, pain scale at worst is 8/10.   Plan: TENs unit ordered Continue to work with PT

## 2020-01-06 NOTE — Progress Notes (Signed)
Internal Medicine Clinic Resident  I have personally reviewed this encounter including the documentation in this note and/or discussed this patient with the care management provider. I will address any urgent items identified by the care management provider and will communicate my actions to the patient's PCP. I have reviewed the patient's [name] visit with my supervising attending, Dr Vincent.  Sheritha Louis, MD 01/06/2020  

## 2020-01-06 NOTE — Progress Notes (Signed)
CC: Back pain  HPI:  Kristen Frost is a 64 y.o. female with a past medical history stated below and presents today for back pain. Please see problem based assessment and plan for additional details.  Past Medical History:  Diagnosis Date  . Anxiety   . Arthritis   . Bleeding hemorrhoids    grade 2  . Diverticulosis of colon   . Full dentures   . History of colon polyps   . Hyperlipidemia   . Hypertension    followed by pcp   (04-30-2019  per pt had stress test approx. 2005, was told normal (not available in epic)  . IDA (iron deficiency anemia)   . OSA on CPAP    followed by dr Halford Chessman ([place] [facility])    (04-30-2019  pt  stated uses every night)  . Restless leg syndrome   . Type 2 diabetes mellitus ([place])    followed by pcp   (04-30-2019  pt stated does not check blood sugar at home)    Current Outpatient Medications on File Prior to Visit  Medication Sig Dispense Refill  . amLODipine (NORVASC) 10 MG tablet TAKE 1 TABLET BY MOUTH DAILY 90 tablet 3  . aspirin EC 81 MG tablet Take 81 mg by mouth daily.    Marland Kitchen atorvastatin (LIPITOR) 40 MG tablet Take 1 tablet (40 mg total) by mouth daily. 90 tablet 3  . Cholecalciferol (VITAMIN D) 2000 units CAPS Take 1 capsule (2,000 Units total) by mouth daily. (Patient taking differently: Take 2,000 Units by mouth daily. ) 90 capsule 1  . ferrous sulfate 325 (65 FE) MG tablet Take 1 tablet (325 mg total) by mouth daily with breakfast. (Patient taking differently: Take 325 mg by mouth daily with breakfast. ) 90 tablet 1  . fluticasone (FLONASE) 50 MCG/ACT nasal spray Place 1 spray into both nostrils daily. (Patient taking differently: Place 1 spray into both nostrils daily as needed. ) 16 g 0  . Insulin Pen Needle (PEN NEEDLES) 31G X 5 MM MISC 1 application by Does not apply route daily. 90 each 1  . liraglutide (VICTOZA) 18 MG/3ML SOPN Inject 0.3 mLs (1.8 mg total) into the skin daily. 5 pen 4  . methocarbamol (ROBAXIN) 500 MG tablet Take 1  tablet (500 mg total) by mouth 2 (two) times daily. 20 tablet 0   No current facility-administered medications on file prior to visit.    Family History  Problem Relation Age of Onset  . Allergies Father   . Alcohol abuse Father   . Tuberculosis Father        deceased from TB  . Allergies Other        sibling  . Asthma Other        sibling  . Hypertension Other        sibling  . Migraines Other        sibling  . Rashes / Skin problems Other        sibling  . Gout Other        sibling    Social History   Socioeconomic History  . Marital status: Married    Spouse name: Not on file  . Number of children: Not on file  . Years of education: Not on file  . Highest education level: Not on file  Occupational History  . Occupation: unemployeed  Tobacco Use  . Smoking status: Never Smoker  . Smokeless tobacco: Never Used  Vaping Use  . Vaping Use:  Never used  Substance and Sexual Activity  . Alcohol use: No  . Drug use: Never  . Sexual activity: Not on file  Other Topics Concern  . Not on file  Social History Narrative  . Not on file   Social Determinants of Health   Financial Resource Strain: Low Risk   . Difficulty of Paying Living Expenses: Not very hard  Food Insecurity: No Food Insecurity  . Worried About Charity fundraiser in the Last Year: Never true  . Ran Out of Food in the Last Year: Never true  Transportation Needs: No Transportation Needs  . Lack of Transportation (Medical): No  . Lack of Transportation (Non-Medical): No  Physical Activity: Inactive  . Days of Exercise per Week: 0 days  . Minutes of Exercise per Session: 0 min  Stress: No Stress Concern Present  . Feeling of Stress : Only a little  Social Connections: Moderately Integrated  . Frequency of Communication with Friends and Family: More than three times a week  . Frequency of Social Gatherings with Friends and Family: More than three times a week  . Attends Religious Services: More  than 4 times per year  . Active Member of Clubs or Organizations: No  . Attends Archivist Meetings: Never  . Marital Status: Married  Human resources officer Violence: Not At Risk  . Fear of Current or Ex-Partner: No  . Emotionally Abused: No  . Physically Abused: No  . Sexually Abused: No    Review of Systems: ROS negative except for what is noted on the assessment and plan.  Vitals:   01/06/20 1408  BP: 107/78  Pulse: (!) 103  Temp: 98.3 F (36.8 C)  TempSrc: Oral  Weight: 231 lb 8 oz (105 kg)  Height: 5' 1.5" (1.562 m)     Physical Exam: Physical Exam Vitals and nursing note reviewed.  Constitutional:      General: She is not in acute distress.    Appearance: Normal appearance. She is not ill-appearing or toxic-appearing.  HENT:     Head: Normocephalic and atraumatic.  Cardiovascular:     Rate and Rhythm: Normal rate and regular rhythm.     Pulses: Normal pulses.     Heart sounds: Normal heart sounds. No murmur heard.  No friction rub. No gallop.   Pulmonary:     Effort: Pulmonary effort is normal. No respiratory distress.     Breath sounds: Normal breath sounds. No stridor. No wheezing, rhonchi or rales.  Musculoskeletal:       Arms:     Comments: Patient tender to palpate, 2 inches to the left of the transverse process of vertebra T8-T10.   Skin:    General: Skin is warm and dry.  Neurological:     General: No focal deficit present.     Mental Status: She is alert and oriented to person, place, and time. Mental status is at baseline.  Psychiatric:        Mood and Affect: Mood normal.        Behavior: Behavior normal.        Thought Content: Thought content normal.        Judgment: Judgment normal.      Assessment & Plan:   See Encounters Tab for problem based charting.  Patient seen with Dr. Donnita Falls, D.O. [facility], PGY-1 Pager: [phone], Phone: [phone] Date 01/06/2020 Time 6:53 PM

## 2020-01-06 NOTE — Patient Instructions (Signed)
Thank you, Kristen Frost for allowing [place] to provide your care today. Today we discussed back pain. It appears the back pain is due to a muscle. We will write you for the TENs unit for pain relief.   I am glad you are improving and doing better. You are doing a great job with your weight loss! Keep up the good work!  I have ordered the following labs for you:  Lab Orders  No laboratory test(s) ordered today      Referrals ordered today:   Referral Orders  No referral(s) requested today     I have ordered the following medication/changed the following medications:   Stop the following medications: There are no discontinued medications.   Start the following medications: No orders of the defined types were placed in this encounter.    Follow up: 3 months    Remember: If the pain continues to worsen, please call!  Should you have any questions or concerns please call the internal medicine clinic at [phone].     Sanjuana Letters, D.O. [place]

## 2020-01-07 ENCOUNTER — Encounter: Payer: Self-pay | Admitting: Physical Therapy

## 2020-01-07 ENCOUNTER — Ambulatory Visit: Payer: Medicaid Other | Admitting: Physical Therapy

## 2020-01-07 ENCOUNTER — Ambulatory Visit: Payer: Medicaid Other | Admitting: *Deleted

## 2020-01-07 DIAGNOSIS — M545 Low back pain, unspecified: Secondary | ICD-10-CM

## 2020-01-07 DIAGNOSIS — E785 Hyperlipidemia, unspecified: Secondary | ICD-10-CM

## 2020-01-07 DIAGNOSIS — I10 Essential (primary) hypertension: Secondary | ICD-10-CM

## 2020-01-07 DIAGNOSIS — E119 Type 2 diabetes mellitus without complications: Secondary | ICD-10-CM

## 2020-01-07 DIAGNOSIS — N182 Chronic kidney disease, stage 2 (mild): Secondary | ICD-10-CM

## 2020-01-07 DIAGNOSIS — M25512 Pain in left shoulder: Secondary | ICD-10-CM

## 2020-01-07 DIAGNOSIS — R252 Cramp and spasm: Secondary | ICD-10-CM

## 2020-01-07 DIAGNOSIS — M25511 Pain in right shoulder: Secondary | ICD-10-CM

## 2020-01-07 NOTE — Progress Notes (Signed)
Internal Medicine Clinic Resident  I have personally reviewed this encounter including the documentation in this note and/or discussed this patient with the care management provider. I will address any urgent items identified by the care management provider and will communicate my actions to the patient's PCP. I have reviewed the patient's [name] visit with my supervising attending, Dr Philipp Ovens.  Patient seen by me yesterday, TENS unit ordered.  Sanjuana Letters, MD 01/07/2020

## 2020-01-07 NOTE — Therapy (Signed)
[place], [place], [address] Phone: [phone]   Fax:  [phone]  Physical Therapy Treatment  Patient Details  Name: Kristen Frost MRN: [id number] Date of Birth: 1955-02-25 Referring Provider (PT): Dr. Alita Chyle    Encounter Date: 01/07/2020   PT End of Session - 01/07/20 0922    Visit Number 9    Number of Visits 12    Date for PT Re-Evaluation 01/19/20    Authorization Type [employer] MCD    Authorization - Visit Number 9    Authorization - Number of Visits 27    Progress Note Due on Visit 10    PT Start Time 0916    PT Stop Time 1009    PT Time Calculation (min) 53 min    Activity Tolerance Patient tolerated treatment well    Behavior During Therapy [facility] for tasks assessed/performed           Past Medical History:  Diagnosis Date  . Anxiety   . Arthritis   . Bleeding hemorrhoids    grade 2  . Diverticulosis of colon   . Full dentures   . History of colon polyps   . Hyperlipidemia   . Hypertension    followed by pcp   (04-30-2019  per pt had stress test approx. 2005, was told normal (not available in epic)  . IDA (iron deficiency anemia)   . OSA on CPAP    followed by dr Halford Chessman ([place] [facility])    (04-30-2019  pt  stated uses every night)  . Restless leg syndrome   . Type 2 diabetes mellitus ([place])    followed by pcp   (04-30-2019  pt stated does not check blood sugar at home)    Past Surgical History:  Procedure Laterality Date  . CHOLECYSTECTOMY N/A 06/07/2016   Procedure: LAPAROSCOPIC CHOLECYSTECTOMY;  Surgeon: Georganna Skeans, MD;  Location: [facility];  Service: General;  Laterality: N/A;  . COLONOSCOPY WITH PROPOFOL  last one 04-10-2018    dr Loletha Carrow  . ROTATOR CUFF REPAIR Left 2007   approx.  . TRANSANAL HEMORRHOIDAL DEARTERIALIZATION N/A 05/07/2019   Procedure: TRANSANAL HEMORRHOIDAL DEARTERIALIZATION;  Surgeon: Leighton Ruff, MD;  Location: [facility];  Service:  General;  Laterality: N/A;    There were no vitals filed for this visit.   Subjective Assessment - 01/07/20 0920    Subjective I saw the doctor and he was going to call you.  They think it is a muscle. The shoulder is much better.  I am working on getting a TENS unit    Currently in Pain? No/denies    Pain Score 0-No pain   was 7/10   Pain Location Back    Pain Orientation Left;Mid    Pain Descriptors / Indicators Sharp;Stabbing;Other (Comment)   electricity   Pain Type Chronic pain    Pain Onset More than a month ago    Pain Frequency Intermittent    Aggravating Factors  unsure can be walking    Pain Relieving Factors takes muscle relaxers    Effect of Pain on Daily Activities effects her mobility    Pain Score 0    Pain Location Shoulder             [facility] Adult PT Treatment/Exercise - 01/07/20 0001      Lumbar Exercises: Aerobic   Nustep 7 min L5 UE and LE       Shoulder Exercises: Seated   Protraction Weight (  lbs) bilateral UEs reaching away x 5     Horizontal ABduction Strengthening;Both;15 reps;Theraband    Theraband Level (Shoulder Horizontal ABduction) Level 2 (Red)    External Rotation Strengthening;Both;15 reps    Theraband Level (Shoulder External Rotation) Level 2 (Red)    Other Seated Exercises rhomboid stretching       Shoulder Exercises: Standing   Protraction Weight (lbs) on wall x 5     Shoulder Flexion Weight (lbs) double looped band overhead on wall x 10     Other Standing Exercises lat pull down red x 10       Moist Heat Therapy   Number Minutes Moist Heat 15 Minutes    Moist Heat Location Shoulder   LEFT     Electrical Stimulation   Electrical Stimulation Location Upper, mid  back    Electrical Stimulation Action IFC    Electrical Stimulation Parameters to tolerance     Electrical Stimulation Goals Pain              PT Long Term Goals - 01/07/20 0923      PT LONG TERM GOAL #1   Title Pt will be able to lift her L arm overhead with no  pain for improved reaching, ADLs    Status Achieved      PT LONG TERM GOAL #2   Title Pt will be I with HEP for cervical stab, posture, ROM, UE strength     Status On-going      PT LONG TERM GOAL #3   Title Pt will be able to carry items waist height without increasing pain in L UE     Status On-going      PT LONG TERM GOAL #4   Title Pt will be able to walk as she needs to in the community without increased low back pain    Baseline pain sometimes when walking in the grocery store    Status Partially Met      PT LONG TERM GOAL #5   Title Pt will be able to demo pain free AROM of bilateral UEs.    Status Achieved                 Plan - 01/07/20 0177    Clinical Impression Statement Patient continues to make progress, reporting occasional yet severe pain which is not related to a specific activity.  She is hypersensitive to palpation along L thoracic spine, rhomboid. No new exercises added , cont POC and likely DC end of the month. Plans to get TENS in the coming weeks.    PT Treatment/Interventions ADLs/Self Care Home Management;Electrical Stimulation;Moist Heat;Cryotherapy;Ultrasound;Functional mobility training;Dry needling;Manual techniques;Therapeutic exercise;Patient/family education;Therapeutic activities;Neuromuscular re-education;Taping;Spinal Manipulations    PT Next Visit Plan Progress core, reduce spasm and pain in upper back, shoulder    PT Home Exercise Plan [email or web address]    Consulted and Agree with Plan of Care Patient           Patient will benefit from skilled therapeutic intervention in order to improve the following deficits and impairments:  Increased fascial restricitons, Pain, Increased muscle spasms, Decreased mobility, Decreased range of motion, Decreased strength, Impaired flexibility, Difficulty walking, Obesity, Impaired UE functional use  Visit Diagnosis: Acute midline low back pain, unspecified whether sciatica present  Cramp and spasm  Acute pain  of both shoulders     Problem List Patient Active Problem List   Diagnosis Date Noted  . Visit for routine gyn exam 11/09/2019  . Left shoulder pain  10/29/2019  . Acute back pain 09/22/2019  . CKD (chronic kidney disease) stage 2, GFR 60-89 ml/min 08/09/2016  . Healthcare maintenance 05/17/2016  . Vitamin D deficiency 03/09/2016  . Dyslipidemia 03/02/2016  . Morbid obesity ([place]) 03/01/2016  . T2DM (type 2 diabetes mellitus) ([place]) 03/01/2016  . HTN (hypertension) 11/17/2015  . OSA (obstructive sleep apnea) 12/04/2011  . RLS (restless legs syndrome) 12/04/2011    Jahyra Sukup 01/07/2020, 5:29 PM  [facility] [address] [place], [place], [address] Phone: [phone]   Fax:  [phone]  Name: Colie Josten MRN: [id number] Date of Birth: 05/23/1955  Raeford Razor, PT 01/07/20 5:29 PM Phone: [phone] Fax: [phone]

## 2020-01-07 NOTE — Chronic Care Management (AMB) (Signed)
  Care Management   Follow Up Note   01/07/2020 Name: Kristen Frost MRN: [id number] DOB: 03-09-1955  [name] is enrolled in a Managed Medicaid plan: Yes. Outreach attempt today was successful.    Referred by: [name], MD Reason for referral : Care Coordination (HTN, NIDDM, HLD, CKD)   [name] is a 64 y.o. year old female who is a primary care patient of [name], [name], MD. The care management team was consulted for assistance with care management and care coordination needs.    Review of patient status, including review of consultants reports, relevant laboratory and other test results, and collaboration with appropriate care team members and the patient's provider was performed as part of comprehensive patient evaluation and provision of chronic care management services.    Goals Addressed              This Visit's Progress     Patient Stated   .  'My physical therapist is recommending a home TENS unit to treat my chronic pain." (pt-stated)        CARE PLAN ENTRY (see longitudinal plan of care for additional care plan information)  Current Barriers:  . Care Coordination needs related to needed DME/Supplies in a patient with HTN, DMII, CKD Stage 2, and chronic back and shoulder pain . Care Coordination needs related to securing home TENS unit in a patient with HTN, DMII, CKD Stage 2, and chronic back and shoulder pain- spoke with patient to discuss DME supplier for home TENS unit  Nurse Case Manager Clinical Goal(s):  Marland Kitchen Over the next 60-120 days, patient will verbalize understanding of plan to obtain needed DME.  . Over the next 60-120 days, patient will work with care guide and/or [name] to address needs related to DME needs. . Over the next 60-120 days, patient will communicate with RN Care Manager to address need for home TENS unit.  Interventions:  . Inter-disciplinary care team collaboration (see  longitudinal plan of care) . Collaboration with/confirmation of receipt of DME orders at [name] . Advised patient to make clinic appointment to discuss home TENS unit with provider . Collaborated with primary care provider regarding documentation needed for prior approval for Medicaid coverage . 01/07/20- Presented patient with home TENS DME suppliers . 01/07/20 - Explained to patient that DME company will obtain Medicaid prior approval and then contact patient about shipping TENS unit to her home . 01/07/20- Per patient request, clinic notes of 11/17, order for home TENS unit, and demographic sheet faxed to [employer]  Patient Self Care Activities:  . Patient verbalizes understanding of plan to work with clinic staff and [name] team to secure home TENS unit . Unable to independently secure home TENS unit to treat chronic pain  Please see past updates related to this goal by clicking on the "Past Updates" button in the selected goal          The care management team will reach out to the patient again over the next 30-60 days.   [name], [name], [facility] RN Care Manager [phone]

## 2020-01-07 NOTE — Progress Notes (Signed)
Internal Medicine Clinic Attending  I saw and evaluated the patient.  I personally confirmed the key portions of the history and exam documented by Dr. [name] and I reviewed pertinent patient test results.  The assessment, diagnosis, and plan were formulated together and I agree with the documentation in the resident's note.  

## 2020-01-07 NOTE — Progress Notes (Signed)
Internal Medicine Clinic Attending  [name] services provided by the care management provider and their documentation were discussed with Dr. [name] . We reviewed the pertinent findings, urgent action items addressed by the resident and non-urgent items to be addressed by the PCP.  I agree with the assessment, diagnosis, and plan of care documented in the [name] and resident's note.  Lashunta Frieden, MD 01/07/2020 

## 2020-01-08 NOTE — Progress Notes (Signed)
Internal Medicine Clinic Resident  I have personally reviewed this encounter including the documentation in this note and/or discussed this patient with the care management provider. I will address any urgent items identified by the care management provider and will communicate my actions to the patient's PCP. I have reviewed the patient's [name] visit with my supervising attending, Dr Heber Pioche.  Sanjuana Letters, MD 01/08/2020

## 2020-01-12 ENCOUNTER — Other Ambulatory Visit: Payer: Self-pay

## 2020-01-12 ENCOUNTER — Encounter: Payer: Self-pay | Admitting: Physical Therapy

## 2020-01-12 ENCOUNTER — Ambulatory Visit: Payer: Medicaid Other | Admitting: Physical Therapy

## 2020-01-12 DIAGNOSIS — R252 Cramp and spasm: Secondary | ICD-10-CM

## 2020-01-12 DIAGNOSIS — M545 Low back pain, unspecified: Secondary | ICD-10-CM | POA: Diagnosis not present

## 2020-01-12 DIAGNOSIS — M25511 Pain in right shoulder: Secondary | ICD-10-CM

## 2020-01-12 NOTE — Therapy (Signed)
[place], [place], [address] Phone: [phone]   Fax:  [phone]  Physical Therapy Treatment  Patient Details  Name: Kristen Frost MRN: [id number] Date of Birth: November 05, 1955 Referring Provider (PT): Dr. Alita Chyle    Encounter Date: 01/12/2020    Progress Note Reporting Period 11/24/19 to 01/12/20  See note below for Objective Data and Assessment of Progress/Goals.        PT End of Session - 01/12/20 0835    Visit Number 10    Number of Visits 12    Date for PT Re-Evaluation 01/19/20    Authorization Type UHC MCD    Authorization - Visit Number 10    Authorization - Number of Visits 27    PT Start Time [phone]    PT Stop Time 0930    PT Time Calculation (min) 56 min    Activity Tolerance Patient tolerated treatment well    Behavior During Therapy WFL for tasks assessed/performed           Past Medical History:  Diagnosis Date  . Anxiety   . Arthritis   . Bleeding hemorrhoids    grade 2  . Diverticulosis of colon   . Full dentures   . History of colon polyps   . Hyperlipidemia   . Hypertension    followed by pcp   (04-30-2019  per pt had stress test approx. 2005, was told normal (not available in epic)  . IDA (iron deficiency anemia)   . OSA on CPAP    followed by dr Halford Chessman ([place] [facility])    (04-30-2019  pt  stated uses every night)  . Restless leg syndrome   . Type 2 diabetes mellitus ([place])    followed by pcp   (04-30-2019  pt stated does not check blood sugar at home)    Past Surgical History:  Procedure Laterality Date  . CHOLECYSTECTOMY N/A 06/07/2016   Procedure: LAPAROSCOPIC CHOLECYSTECTOMY;  Surgeon: Georganna Skeans, MD;  Location: [facility];  Service: General;  Laterality: N/A;  . COLONOSCOPY WITH PROPOFOL  last one 04-10-2018    dr Loletha Carrow  . ROTATOR CUFF REPAIR Left 2007   approx.  . TRANSANAL HEMORRHOIDAL DEARTERIALIZATION N/A 05/07/2019   Procedure: TRANSANAL  HEMORRHOIDAL DEARTERIALIZATION;  Surgeon: Leighton Ruff, MD;  Location: [facility];  Service: General;  Laterality: N/A;    There were no vitals filed for this visit.   Subjective Assessment - 01/12/20 0835    Subjective No pain right now.  The upper back is much better too.  A couple days after last time.           [facility] Adult PT Treatment/Exercise - 01/12/20 0001      Lumbar Exercises: Aerobic   Nustep 7 min L5 UE and LE       Shoulder Exercises: Standing   Horizontal ABduction Strengthening;Both;15 reps;Theraband    Theraband Level (Shoulder Horizontal ABduction) Level 2 (Red)    External Rotation Strengthening;Both;15 reps    Theraband Level (Shoulder External Rotation) Level 2 (Red)    Shoulder Flexion Weight (lbs) overhead lift red band x 10     Row Strengthening;Both;12 reps;Weights    Row Weight (lbs) 5      Shoulder Exercises: ROM/Strengthening   Nustep UE and LE L6 for 6 min       Moist Heat Therapy   Number Minutes Moist Heat 15 Minutes    Moist Heat Location Shoulder   LEFT  Theme park manager Upper, mid  back    Electrical Stimulation Action IFC     Electrical Stimulation Parameters 24     Electrical Stimulation Goals Pain      Manual Therapy   Soft tissue mobilization L and R upper traps , levator scap and post cervicals , deep pressure to Tr P      Neck Exercises: Stretches   Upper Trapezius Stretch 3 reps    Levator Stretch 3 reps              PT Long Term Goals - 01/07/20 0923      PT LONG TERM GOAL #1   Title Pt will be able to lift her L arm overhead with no pain for improved reaching, ADLs    Status Achieved      PT LONG TERM GOAL #2   Title Pt will be I with HEP for cervical stab, posture, ROM, UE strength     Status On-going      PT LONG TERM GOAL #3   Title Pt will be able to carry items waist height without increasing pain in L UE     Status On-going      PT LONG TERM GOAL  #4   Title Pt will be able to walk as she needs to in the community without increased low back pain    Baseline pain sometimes when walking in the grocery store    Status Partially Met      PT LONG TERM GOAL #5   Title Pt will be able to demo pain free AROM of bilateral UEs.    Status Achieved                 Plan - 01/12/20 1355    Clinical Impression Statement Kristen Frost has no pain at rest today, able to perform shoulder and upper back exercises with min increased pain in LT side.  She is still waiting for her TENS unit.  Trigger point present in L levator scapula which was relieved with manual and heat.    PT Treatment/Interventions ADLs/Self Care Home Management;Electrical Stimulation;Moist Heat;Cryotherapy;Ultrasound;Functional mobility training;Dry needling;Manual techniques;Therapeutic exercise;Patient/family education;Therapeutic activities;Neuromuscular re-education;Taping;Spinal Manipulations    PT Next Visit Plan Last visit.  Final HEP, DC, post measures , FOTO    PT Home Exercise Plan [email or web address]    Consulted and Agree with Plan of Care Patient           Patient will benefit from skilled therapeutic intervention in order to improve the following deficits and impairments:  Increased fascial restricitons, Pain, Increased muscle spasms, Decreased mobility, Decreased range of motion, Decreased strength, Impaired flexibility, Difficulty walking, Obesity, Impaired UE functional use  Visit Diagnosis: Acute midline low back pain, unspecified whether sciatica present  Cramp and spasm  Acute pain of both shoulders     Problem List Patient Active Problem List   Diagnosis Date Noted  . Visit for routine gyn exam 11/09/2019  . Left shoulder pain 10/29/2019  . Acute back pain 09/22/2019  . CKD (chronic kidney disease) stage 2, GFR 60-89 ml/min 08/09/2016  . Healthcare maintenance 05/17/2016  . Vitamin D deficiency 03/09/2016  . Dyslipidemia 03/02/2016  . Morbid obesity  ([place]) 03/01/2016  . T2DM (type 2 diabetes mellitus) ([place]) 03/01/2016  . HTN (hypertension) 11/17/2015  . OSA (obstructive sleep apnea) 12/04/2011  . RLS (restless legs syndrome) 12/04/2011    Kristen Frost 01/12/2020, 2:11 PM  [place]  [place], [place], [address] Phone: [phone]   Fax:  [phone]  Name: Kristen Frost MRN: [id number] Date of Birth: 03-21-1955  Raeford Razor, PT 01/12/20 2:11 PM Phone: [phone] Fax: [phone]

## 2020-01-13 ENCOUNTER — Ambulatory Visit: Payer: Medicaid Other | Admitting: *Deleted

## 2020-01-13 DIAGNOSIS — N182 Chronic kidney disease, stage 2 (mild): Secondary | ICD-10-CM

## 2020-01-13 DIAGNOSIS — F419 Anxiety disorder, unspecified: Secondary | ICD-10-CM

## 2020-01-13 DIAGNOSIS — E785 Hyperlipidemia, unspecified: Secondary | ICD-10-CM

## 2020-01-13 DIAGNOSIS — I10 Essential (primary) hypertension: Secondary | ICD-10-CM

## 2020-01-13 DIAGNOSIS — E119 Type 2 diabetes mellitus without complications: Secondary | ICD-10-CM

## 2020-01-13 NOTE — Chronic Care Management (AMB) (Signed)
  Care Management   Follow Up Note   01/13/2020 Name: Kristen Frost MRN: [id number] DOB: 1955/12/16  Kristen Frost is enrolled in a Managed Medicaid plan: Yes. Outreach attempt today was successful.    Referred by: [name], MD Reason for referral : Care Coordination (HTN, NIDDM, HLD, CKD)   Kristen Frost is a 64 y.o. year old female who is a primary care patient of [name], [name], MD. The care management team was consulted for assistance with care management and care coordination needs.    Review of patient status, including review of consultants reports, relevant laboratory and other test results, and collaboration with appropriate care team members and the patient's provider was performed as part of comprehensive patient evaluation and provision of chronic care management services.    Goals Addressed              This Visit's Progress     Patient Stated   .  'My physical therapist is recommending a home TENS unit to treat my chronic pain." (pt-stated)        CARE PLAN ENTRY (see longitudinal plan of care for additional care plan information)  Current Barriers:  . Care Coordination needs related to needed DME/Supplies in a patient with HTN, DMII, CKD Stage 2, and chronic back and shoulder pain- returned call to [place] at [employer] at [phone] to advise no fax has been received as the NPI number for the TENS unit must be signed by approved by a [facility] Medicaid provider, Rhea Pink states she transposed the last 2 fax numbers so she will resend the fax to this [name] RN's  attention . Care Coordination needs related to securing home TENS unit in a patient with HTN, DMII, CKD Stage 2, and chronic back and shoulder pain- spoke with patient to discuss DME supplier for home TENS unit  Nurse Case Manager Clinical Goal(s):  Marland Kitchen Over the next 60-120 days, patient will verbalize understanding of plan to obtain needed DME.  . Over the  next 60-120 days, patient will work with care guide and/or [name] to address needs related to DME needs. . Over the next 60-120 days, patient will communicate with RN Care Manager to address need for home TENS unit.  Interventions:  . Inter-disciplinary care team collaboration (see longitudinal plan of care) . Collaboration with Rhea Pink at [place] and fax will be resent to clinic for signature on TENS order by an approved [facility] Medicaid provider  Patient Self Care Activities:  . Patient verbalizes understanding of plan to work with clinic staff and [name] team to secure home TENS unit . Unable to independently secure home TENS unit to treat chronic pain  Please see past updates related to this goal by clicking on the "Past Updates" button in the selected goal          The care management team will reach out to the patient again over the next 30-60 days.   [name], [name], [facility] RN Care Manager [phone]

## 2020-01-18 ENCOUNTER — Ambulatory Visit: Payer: Medicaid Other | Admitting: Physical Therapy

## 2020-01-18 ENCOUNTER — Other Ambulatory Visit: Payer: Self-pay

## 2020-01-18 ENCOUNTER — Encounter: Payer: Self-pay | Admitting: Physical Therapy

## 2020-01-18 DIAGNOSIS — M545 Low back pain, unspecified: Secondary | ICD-10-CM | POA: Diagnosis not present

## 2020-01-18 DIAGNOSIS — M25512 Pain in left shoulder: Secondary | ICD-10-CM

## 2020-01-18 DIAGNOSIS — R252 Cramp and spasm: Secondary | ICD-10-CM

## 2020-01-18 NOTE — Therapy (Signed)
[place], [place], [address] Phone: [phone]   Fax:  [phone]  Physical Therapy Treatment  Patient Details  Name: Kristen Frost MRN: [id number] Date of Birth: [birth year] Referring Provider (PT): Dr. Alita Chyle    Encounter Date: 01/18/2020   PT End of Session - 01/18/20 0906    Visit Number 11    Number of Visits 12    Date for PT Re-Evaluation 01/19/20    Authorization Type UHC MCD    Authorization - Visit Number 11    Authorization - Number of Visits 27    Progress Note Due on Visit 10    PT Start Time 0832    PT Stop Time 0903    PT Time Calculation (min) 31 min    Activity Tolerance Patient tolerated treatment well    Behavior During Therapy [facility] for tasks assessed/performed           Past Medical History:  Diagnosis Date  . Anxiety   . Arthritis   . Bleeding hemorrhoids    grade 2  . Diverticulosis of colon   . Full dentures   . History of colon polyps   . Hyperlipidemia   . Hypertension    followed by pcp   (04-30-2019  per pt had stress test approx. 2005, was told normal (not available in epic)  . IDA (iron deficiency anemia)   . OSA on CPAP    followed by dr Halford Chessman ([place] [facility])    (04-30-2019  pt  stated uses every night)  . Restless leg syndrome   . Type 2 diabetes mellitus ([address])    followed by pcp   (04-30-2019  pt stated does not check blood sugar at home)    Past Surgical History:  Procedure Laterality Date  . CHOLECYSTECTOMY N/A 06/07/2016   Procedure: LAPAROSCOPIC CHOLECYSTECTOMY;  Surgeon: Georganna Skeans, MD;  Location: [facility];  Service: General;  Laterality: N/A;  . COLONOSCOPY WITH PROPOFOL  last one 04-10-2018    dr Loletha Carrow  . ROTATOR CUFF REPAIR Left 2007   approx.  . TRANSANAL HEMORRHOIDAL DEARTERIALIZATION N/A 05/07/2019   Procedure: TRANSANAL HEMORRHOIDAL DEARTERIALIZATION;  Surgeon: Leighton Ruff, MD;  Location: [facility];   Service: General;  Laterality: N/A;    There were no vitals filed for this visit.   Subjective Assessment - 01/18/20 0835    Subjective She has not had any pain since last week. Ready for discharge    Currently in Pain? No/denies              [facility] PT Assessment - 01/18/20 0001      AROM   Cervical Flexion 65    Cervical Extension 75    Cervical - Right Side Bend 45    Cervical - Left Side Bend 50    Cervical - Right Rotation WNL    Cervical - Left Rotation WNL       Strength   Right Shoulder Flexion 4+/5    Right Shoulder ABduction 4+/5    Left Shoulder Flexion 4+/5    Left Shoulder ABduction 4+/5    Right Elbow Flexion 5/5    Left Elbow Flexion 5/5            [facility] Adult PT Treatment/Exercise - 01/18/20 0001      Lumbar Exercises: Stretches   Lower Trunk Rotation 10 seconds    Lower Trunk Rotation Limitations x 10       Lumbar Exercises:  Aerobic   Nustep 7 min L5 UE and LE       Lumbar Exercises: Supine   Bridge 10 reps    Other Supine Lumbar Exercises 90/90 supine hold and then toe taps x 10 reverse with hands under tailbone       Shoulder Exercises: Supine   Horizontal ABduction Both;10 reps    Theraband Level (Shoulder Horizontal ABduction) Level 2 (Red)    Horizontal ABduction Weight (lbs) done with bridge     Other Supine Exercises upper trunk rotation x 10       Neck Exercises: Stretches   Upper Trapezius Stretch Right;Left;1 rep    Levator Stretch Right;Left;1 rep    Other Neck Stretches for demo                   PT Education - 01/18/20 0906    Education Details discharge    Person(s) Educated Patient    Methods Explanation    Comprehension Verbalized understanding               PT Long Term Goals - 01/18/20 0852      PT LONG TERM GOAL #1   Title Pt will be able to lift her L arm overhead with no pain for improved reaching, ADLs    Status Achieved      PT LONG TERM GOAL #2   Title Pt will be I with HEP for cervical  stab, posture, ROM, UE strength     Status Achieved      PT LONG TERM GOAL #3   Title Pt will be able to carry items waist height without increasing pain in L UE     Status Achieved      PT LONG TERM GOAL #4   Title Pt will be able to walk as she needs to in the community without increased low back pain    Baseline pain sometimes when walking in the grocery store    Status Partially Met      PT LONG TERM GOAL #5   Title Pt will be able to demo pain free AROM of bilateral UEs.    Status Achieved                 Plan - 01/18/20 0907    Clinical Impression Statement Patient has continued to have no pain in L upper back, shoulder.  She has met all of her goals. She is still interested in a TENS unit for home.  She is discharged from PT.    PT Next Visit Plan NA    PT Home Exercise Plan [email or web address]    Consulted and Agree with Plan of Care Patient           Patient will benefit from skilled therapeutic intervention in order to improve the following deficits and impairments:  Increased fascial restricitons, Pain, Increased muscle spasms, Decreased mobility, Decreased range of motion, Decreased strength, Impaired flexibility, Difficulty walking, Obesity, Impaired UE functional use  Visit Diagnosis: Acute midline low back pain, unspecified whether sciatica present  Cramp and spasm  Acute pain of both shoulders     Problem List Patient Active Problem List   Diagnosis Date Noted  . Visit for routine gyn exam 11/09/2019  . Left shoulder pain 10/29/2019  . Acute back pain 09/22/2019  . CKD (chronic kidney disease) stage 2, GFR 60-89 ml/min 08/09/2016  . Healthcare maintenance 05/17/2016  . Vitamin D deficiency 03/09/2016  . Dyslipidemia 03/02/2016  . Morbid obesity (  [place]) 03/01/2016  . T2DM (type 2 diabetes mellitus) ([place]) 03/01/2016  . HTN (hypertension) 11/17/2015  . OSA (obstructive sleep apnea) 12/04/2011  . RLS (restless legs syndrome) 12/04/2011     Kristen Frost 01/18/2020, 9:09 AM  [facility] [address] [place], [place], [address] Phone: [phone]   Fax:  [phone]  Name: Kristen Frost MRN: [id number] Date of Birth: [birth year]  Raeford Razor, PT 01/18/20 9:10 AM Phone: [phone] Fax: [phone]

## 2020-01-21 NOTE — Progress Notes (Signed)
Internal Medicine Clinic Resident  I have personally reviewed this encounter including the documentation in this note and/or discussed this patient with the care management provider. I will address any urgent items identified by the care management provider and will communicate my actions to the patient's PCP. I have reviewed the patient's [name] visit with my supervising attending, Dr Raines.  Beza Steppe K Haylei Cobin, MD 01/21/2020   

## 2020-01-25 ENCOUNTER — Ambulatory Visit: Payer: Medicaid Other | Admitting: *Deleted

## 2020-01-25 DIAGNOSIS — M25512 Pain in left shoulder: Secondary | ICD-10-CM

## 2020-01-25 DIAGNOSIS — I10 Essential (primary) hypertension: Secondary | ICD-10-CM

## 2020-01-25 DIAGNOSIS — N182 Chronic kidney disease, stage 2 (mild): Secondary | ICD-10-CM

## 2020-01-25 DIAGNOSIS — E119 Type 2 diabetes mellitus without complications: Secondary | ICD-10-CM

## 2020-01-25 NOTE — Chronic Care Management (AMB) (Signed)
Chronic Care Management   Follow Up Note   01/25/2020 Name: Kristen Frost MRN: [id number] DOB: Aug 12, 1955  Referred by: [name], MD Reason for referral : Care Coordination ( HTN, NIDDM, HLD, CKD)   Kristen Frost is a 64 y.o. year old female who is a primary care patient of [name], [name], MD. The [name] team was consulted for assistance with chronic disease management and care coordination needs.    Review of patient status, including review of consultants reports, relevant laboratory and other test results, and collaboration with appropriate care team members and the patient's provider was performed as part of comprehensive patient evaluation and provision of chronic care management services.    SDOH (Social Determinants of Health) assessments performed: No See Care Plan activities for detailed interventions related to [facility])     Outpatient Encounter Medications as of 01/25/2020  Medication Sig  . amLODipine (NORVASC) 10 MG tablet TAKE 1 TABLET BY MOUTH DAILY  . aspirin EC 81 MG tablet Take 81 mg by mouth daily.  Marland Kitchen atorvastatin (LIPITOR) 40 MG tablet Take 1 tablet (40 mg total) by mouth daily.  . Cholecalciferol (VITAMIN D) 2000 units CAPS Take 1 capsule (2,000 Units total) by mouth daily. (Patient taking differently: Take 2,000 Units by mouth daily. )  . ferrous sulfate 325 (65 FE) MG tablet Take 1 tablet (325 mg total) by mouth daily with breakfast. (Patient taking differently: Take 325 mg by mouth daily with breakfast. )  . fluticasone (FLONASE) 50 MCG/ACT nasal spray Place 1 spray into both nostrils daily. (Patient taking differently: Place 1 spray into both nostrils daily as needed. )  . Insulin Pen Needle (PEN NEEDLES) 31G X 5 MM MISC 1 application by Does not apply route daily.  Marland Kitchen liraglutide (VICTOZA) 18 MG/3ML SOPN Inject 0.3 mLs (1.8 mg total) into the skin daily.  . metFORMIN (GLUCOPHAGE-XR) 500 MG 24 hr tablet Take 2 tablets (1,000 mg total) by  mouth daily with breakfast.  . methocarbamol (ROBAXIN) 500 MG tablet Take 1 tablet (500 mg total) by mouth 2 (two) times daily.   No facility-administered encounter medications on file as of 01/25/2020.     Objective:   Goals Addressed              This Visit's Progress     Patient Stated   .  'My physical therapist is recommending a home TENS unit to treat my chronic pain." (pt-stated)        CARE PLAN ENTRY (see longitudinal plan of care for additional care plan information)  Current Barriers:  . Care Coordination needs related to needed DME/Supplies in a patient with HTN, DMII, CKD Stage 2, and chronic back and shoulder pain- returned call to [place] at [employer] at [phone] to advise no fax has been received as the NPI number for the TENS unit must be signed by approved by a [facility] Medicaid provider, Rhea Pink states she transposed the last 2 fax numbers so she will resend the fax to this [name] RN's  attention . Care Coordination needs related to securing home TENS unit in a patient with HTN, DMII, CKD Stage 2, and chronic back and shoulder pain- spoke with patient to discuss DME supplier for home TENS unit  Nurse Case Manager Clinical Goal(s):  Marland Kitchen Over the next 60-120 days, patient will verbalize understanding of plan to obtain needed DME.  . Over the next 60-120 days, patient will work with care guide and/or [name] to  address needs related to DME needs. . Over the next 60-120 days, patient will communicate with RN Care Manager to address need for home TENS unit.  Interventions:  . Inter-disciplinary care team collaboration (see longitudinal plan of care) . Collaboration with [name] at [place] and fax will be resent to clinic for signature on TENS order by an approved [facility] Medicaid provider . 01/25/20- Home TENS forms completed by Dr [name] then successfully faxed to [employer] attn: [name] at [phone].  Patient  Self Care Activities:  . Patient verbalizes understanding of plan to work with clinic staff and [name] team to secure home TENS unit . Unable to independently secure home TENS unit to treat chronic pain  Please see past updates related to this goal by clicking on the "Past Updates" button in the selected goal            Plan:   The care management team will reach out to the patient again over the next 30 days.    [name], [name], [facility] RN Care Manager [phone]

## 2020-01-25 NOTE — Progress Notes (Signed)
Internal Medicine Clinic Resident  I have personally reviewed this encounter including the documentation in this note and/or discussed this patient with the care management provider. I will address any urgent items identified by the care management provider and will communicate my actions to the patient's PCP. I have reviewed the patient's [name] visit with my supervising attending, Dr Narendra.  Matt Elveria Lauderbaugh, MD 01/25/2020   

## 2020-01-27 ENCOUNTER — Encounter: Payer: Self-pay | Admitting: *Deleted

## 2020-01-27 DIAGNOSIS — E119 Type 2 diabetes mellitus without complications: Secondary | ICD-10-CM | POA: Diagnosis not present

## 2020-01-27 DIAGNOSIS — H0288A Meibomian gland dysfunction right eye, upper and lower eyelids: Secondary | ICD-10-CM | POA: Diagnosis not present

## 2020-01-27 DIAGNOSIS — H25813 Combined forms of age-related cataract, bilateral: Secondary | ICD-10-CM | POA: Diagnosis not present

## 2020-01-27 DIAGNOSIS — H0288B Meibomian gland dysfunction left eye, upper and lower eyelids: Secondary | ICD-10-CM | POA: Diagnosis not present

## 2020-01-27 DIAGNOSIS — H1045 Other chronic allergic conjunctivitis: Secondary | ICD-10-CM | POA: Diagnosis not present

## 2020-01-27 LAB — HM DIABETES EYE EXAM

## 2020-01-28 NOTE — Progress Notes (Signed)
Internal Medicine Clinic Attending  [name] services provided by the care management provider and their documentation were discussed with Dr. Wynetta Emery. We reviewed the pertinent findings, urgent action items addressed by the resident and non-urgent items to be addressed by the PCP.  I agree with the assessment, diagnosis, and plan of care documented in the [name] and resident's note.  Aldine Contes, MD 01/28/2020

## 2020-01-29 ENCOUNTER — Ambulatory Visit: Payer: Medicaid Other | Admitting: *Deleted

## 2020-01-29 DIAGNOSIS — E119 Type 2 diabetes mellitus without complications: Secondary | ICD-10-CM

## 2020-01-29 DIAGNOSIS — M25512 Pain in left shoulder: Secondary | ICD-10-CM

## 2020-01-29 DIAGNOSIS — I10 Essential (primary) hypertension: Secondary | ICD-10-CM

## 2020-01-29 DIAGNOSIS — E785 Hyperlipidemia, unspecified: Secondary | ICD-10-CM

## 2020-01-29 DIAGNOSIS — N182 Chronic kidney disease, stage 2 (mild): Secondary | ICD-10-CM

## 2020-01-29 NOTE — Chronic Care Management (AMB) (Signed)
  Care Management   Follow Up Note   01/29/2020 Name: Amamda Curbow MRN: [id number] DOB: July 27, 1955  Nicholes Calamity Basinski is enrolled in a Managed Medicaid plan: Yes. Outreach attempt today was successful.    Referred by: [name], MD Reason for referral : Care Coordination (HTN, NIDDM, HLD, CKD)   Mackie Goon is a 64 y.o. year old female who is a primary care patient of [name], [name], MD. The care management team was consulted for assistance with care management and care coordination needs.    Review of patient status, including review of consultants reports, relevant laboratory and other test results, and collaboration with appropriate care team members and the patient's provider was performed as part of comprehensive patient evaluation and provision of chronic care management services.    Goals Addressed              This Visit's Progress     Patient Stated   .  'My physical therapist is recommending a home TENS unit to treat my chronic pain." (pt-stated)        CARE PLAN ENTRY (see longitudinal plan of care for additional care plan information)  Current Barriers:  . Care Coordination needs related to needed DME/Supplies in a patient with HTN, DMII, CKD Stage 2, and chronic back and shoulder pain- returned call to patient after speaking with [name] at [employer] at [phone] to inquire about status of home TENS unit . Care Coordination needs related to securing home TENS unit in a patient with HTN, DMII, CKD Stage 2, and chronic back and shoulder pain- spoke with patient to provide update on status or receiving home TENS unit  Nurse Case Manager Clinical Goal(s):  Marland Kitchen Over the next 60-120 days, patient will verbalize understanding of plan to obtain needed DME.  . Over the next 60-120 days, patient will work with care guide and/or [name] to address needs related to DME needs. . Over the next 60-120  days, patient will communicate with RN Care Manager to address need for home TENS unit.  Interventions:  . Inter-disciplinary care team collaboration (see longitudinal plan of care) . Collaboration with [name] at [place] and fax will be resent to clinic for signature on TENS order by an approved [facility] Medicaid provider . 01/25/20- Home TENS forms completed by Dr [name] then successfully faxed to [employer] attn: [name] at [phone]  . 01/29/20- Spoke with [name] at [employer], she states forms were resubmitted on 01/25/20 and that patient should be contacted next week about the TENS unit . 01/29/20- returned call to patient after she left message earlier today for this [name] RN ; provided patient with update on status of home TENS unit per [name] at [employer]  Patient Self Care Activities:  . Patient verbalizes understanding of plan to work with clinic staff and [name] team to secure home TENS unit . Unable to independently secure home TENS unit to treat chronic pain  Please see past updates related to this goal by clicking on the "Past Updates" button in the selected goal          Telephone follow up appointment with care management team member scheduled for:02/05/20  [name], [name], [facility] RN Care Manager [phone]

## 2020-02-01 NOTE — Progress Notes (Signed)
Internal Medicine Clinic Attending  [name] services provided by the care management provider and their documentation were discussed with Dr. Sharon Seller. We reviewed the pertinent findings, urgent action items addressed by the resident and non-urgent items to be addressed by the PCP.  I agree with the assessment, diagnosis, and plan of care documented in the [name] and resident's note.  Axel Filler, MD 02/01/2020

## 2020-02-01 NOTE — Progress Notes (Signed)
Internal Medicine Clinic Resident  I have personally reviewed this encounter including the documentation in this note and/or discussed this patient with the care management provider. I will address any urgent items identified by the care management provider and will communicate my actions to the patient's PCP. I have reviewed the patient's [name] visit with my supervising attending, Dr Evette Doffing.  Marty Heck, DO 02/01/2020

## 2020-02-04 NOTE — Progress Notes (Signed)
Internal Medicine Clinic Attending  [name] services provided by the care management provider and their documentation were reviewed with Dr. Agyei.  We reviewed the pertinent findings, urgent action items addressed by the resident and non-urgent items to be addressed by the PCP.  I agree with the assessment, diagnosis, and plan of care documented in the [name] and resident's note.  Ryane Konieczny N Dayvin Aber, MD 02/04/2020  

## 2020-02-05 ENCOUNTER — Ambulatory Visit: Payer: Medicaid Other | Admitting: *Deleted

## 2020-02-05 DIAGNOSIS — I10 Essential (primary) hypertension: Secondary | ICD-10-CM

## 2020-02-05 DIAGNOSIS — N182 Chronic kidney disease, stage 2 (mild): Secondary | ICD-10-CM

## 2020-02-05 DIAGNOSIS — E119 Type 2 diabetes mellitus without complications: Secondary | ICD-10-CM

## 2020-02-05 NOTE — Patient Instructions (Signed)
Visit Information It was nice speaking with you today. Goals Addressed              This Visit's Progress     Patient Stated   .  COMPLETED: "I want to continue to lose weight and maintain it and keep my blood sugar in good control." (pt-stated)        CARE PLAN ENTRY (see longitudinal plan of care for additional care plan information)  Current Barriers:  . Chronic Disease Management support and education needs related to DM, HTN, HLD CKD and obesity- spoke with patient to complete follow up assessment, she says she continues to lose weight via [employer] and participates in face to face weigh in every week, she says she does not check her blood sugar at home and rarely check her blood pressure because her readings have met target with her ongoing weight loss, she reports good medication taking behavior, says she completed outpatient physical therapy at the [facility] clinic for her left shoulder pain and back pain and the therapist there have recommended a home TENS unit. Patient asked if Medicaid covers a home TENS unit . She says she will make a clinic appointment to discuss the TENS unit and to meet the Medicaid prior approval guidelines of a face to face assessment with the required documentation, she does not want to treat her chronic pain with opioids and feels the TENS unit will help her manage the pain since she has received the therapy at the OP clinic. Reports that she saw Dr. Halford Chessman earlier this month in follow up for her OSA and he was going to ask [employer] to reach out to her about adjusting her face mask since it no longer fits due to her weight loss.   Nurse Case Manager Clinical Goal(s): Marland Kitchen Over the next 30-60 days, the patient will demonstrate ongoing self health care management ability as evidenced by meeting targets for Hgb A1C and Blood pressure and ongoing weight loss or no weight gain  Interventions:  . Inter-disciplinary care team collaboration (see longitudinal plan  of care) . Evaluation of current treatment plan related to HTN, DM and obesity and patient's adherence to plan as established by provider. . Provided opportunity for patient to ask questions . Reviewed medications with patient and discussed medication taking behavior  . Provided positive feedback for patient's ongoing weight loss and well managed chronic health issues . Encouraged her to call Dr Juanetta Gosling office since [place] has not contacted her about refitting her CPAP mask. . Discussed need for home TENS unit prior approval per Medicaid guidelines.  Encouraged her to make a clinic appointment for face to face assessment for home TENS unit and then notify this [name] RN of the appointment date so the required documentation items can be given to provider seeing her that day.  Patient has appointment on 01/06/20 to discuss TENS unit with clinic provider. . Discussed plans with patient for ongoing care management follow up and provided patient with direct contact information for care management team . Advised patient, providing education and rationale, to monitor blood pressure daily and record, calling clinic provider for findings outside established parameters.   Patient Self Care Activities:  . Patient verbalizes understanding of plan to work with [name] RN to improve chronic disease self management skills . Self administers medications as prescribed . Attends all scheduled provider appointments . Calls pharmacy for medication refills . Performs ADL's independently . Performs IADL's independently . Calls provider office for new  concerns or questions   Please see past updates related to this goal by clicking on the "Past Updates" button in the selected goal      .  'My physical therapist is recommending a home TENS unit to treat my chronic pain." (pt-stated)        [place] (see longitudinal plan of care for additional care plan information)  Current Barriers:  . Care Coordination needs  related to needed DME/Supplies in a patient with HTN, DMII, CKD Stage 2, and chronic back and shoulder pain- spoke with patient and she states she received an e-mail from [employer] that she will be receiving her home TENS unit soon  . Care Coordination needs related to securing home TENS unit in a patient with HTN, DMII, CKD Stage 2, and chronic back and shoulder pain- spoke with patient to get update on status of receiving home TENS unit  Nurse Case Manager Clinical Goal(s):  Marland Kitchen Over the next 60-120 days, patient will verbalize understanding of plan to obtain needed DME.  . Over the next 60-120 days, patient will work with care guide and/or [name] to address needs related to DME needs. . Over the next 60-120 days, patient will communicate with RN Care Manager to address need for home TENS unit.  Interventions:  . Inter-disciplinary care team collaboration (see longitudinal plan of care) . Collaboration with [name] at [place] and fax will be resent to clinic for signature on TENS order by an approved [facility] Medicaid provider . 01/25/20- Home TENS forms completed by Dr [name] then successfully faxed to [employer] attn: [name] at [phone]  . 01/29/20- Spoke with [name] at [employer], she states forms were resubmitted on 01/25/20 and that patient should be contacted next week about the TENS unit . 01/29/20- returned call to patient after she left message earlier today for this [name] RN ; provided patient with update on status of home TENS unit per [name] at [employer] . 02/05/20- assessed status of timing for patient to receive home TENS unit to treat chronic back pain and left shoulder pian  Patient Self Care Activities:  . Patient verbalizes understanding of plan to work with clinic staff and [name] team to secure home TENS unit . Unable to independently secure home TENS unit to treat chronic pain  Please see past updates  related to this goal by clicking on the "Past Updates" button in the selected goal        Other   .  Find Help in My Community by attending [employer] meetings        Timeframe:  Long-Range Goal Priority:  High Start Date:     02/05/20                        Expected End Date:                       Follow Up Date 03/07/20   - follow-up on any referrals for help I am given  - continue to attend [employer] for assistance with weight management    Why is this important?    Knowing how and where to find help for yourself or family in your neighborhood and community is an important skill.   You will want to take some steps to learn how.    Notes:     Marland Kitchen  Make and Keep All Appointments  Timeframe:  Long-Range Goal Priority:  High Start Date:   02/05/20                          Expected End Date:                       Follow Up Date 03/07/20   - call to cancel if needed - keep a calendar with appointment dates    Why is this important?    Part of staying healthy is seeing the doctor for follow-up care.   If you forget your appointments, there are some things you can do to stay on track.    Notes: Next clinic appointment is 04/08/20    .  Manage Chronic Pain - using home TENS unit to treat lower back pain adn left shoulder pain        Timeframe:  Long-Range Goal Priority:  High Start Date:       02/05/20                      Expected End Date:                       Follow Up Date 03/07/20    - develop a personal pain management plan - keep track of prescription refills - track times pain is worst and when it is best - use ice or heat or home TENS unit for pain relief    Why is this important?    Day-to-day life can be hard when you have chronic pain.   Pain medicine is just one piece of the treatment puzzle.   You can try these action steps to help you manage your pain.    Notes:     Marland Kitchen  [employer] My Quality of Life        Timeframe:  Long-Range  Goal Priority:  High Start Date:           02/05/20                  Expected End Date:                       Follow Up Date 03/07/20   - do one enjoyable thing every day    Why is this important?    Having a long-term illness can be scary.   It can also be stressful for you and your caregiver.   These steps may help.    Notes:        The patient verbalized understanding of instructions, educational materials, and care plan provided today and declined offer to receive copy of patient instructions, educational materials, and care plan.   The care management team will reach out to the patient again over the next 30-60 days.   [name], [name], [facility] RN Care Manager [phone]

## 2020-02-05 NOTE — Chronic Care Management (AMB) (Signed)
Care Management   Follow Up Note   02/05/2020 Name: Kristen Frost MRN: [id number] DOB: 15-Nov-1955  Kristen Frost is enrolled in a Managed Medicaid plan: Yes. Outreach attempt today was successful.    Referred by: [name], MD Reason for referral : Care Coordination (HTN, NIDDM, HLD, CKD/)   Kristen Frost is a 64 y.o. year old female who is a primary care patient of [name], [name], MD. The care management team was consulted for assistance with care management and care coordination needs.    Review of patient status, including review of consultants reports, relevant laboratory and other test results, and collaboration with appropriate care team members and the patient's provider was performed as part of comprehensive patient evaluation and provision of chronic care management services.    Goals Addressed              This Visit's Progress     Patient Stated   .  COMPLETED: "I want to continue to lose weight and maintain it and keep my blood sugar in good control." (pt-stated)        CARE PLAN ENTRY (see longitudinal plan of care for additional care plan information)  Current Barriers:  . Chronic Disease Management support and education needs related to DM, HTN, HLD CKD and obesity- spoke with patient to complete follow up assessment, she says she continues to lose weight via [employer] and participates in face to face weigh in every week, she says she does not check her blood sugar at home and rarely check her blood pressure because her readings have met target with her ongoing weight loss, she reports good medication taking behavior, says she completed outpatient physical therapy at the [facility] clinic for her left shoulder pain and back pain and the therapist there have recommended a home TENS unit. Patient asked if Medicaid covers a home TENS unit . She says she will make a clinic appointment to discuss the TENS unit and to meet  the Medicaid prior approval guidelines of a face to face assessment with the required documentation, she does not want to treat her chronic pain with opioids and feels the TENS unit will help her manage the pain since she has received the therapy at the OP clinic. Reports that she saw Dr. Halford Chessman earlier this month in follow up for her OSA and he was going to ask [employer] to reach out to her about adjusting her face mask since it no longer fits due to her weight loss.   Nurse Case Manager Clinical Goal(s): Marland Kitchen Over the next 30-60 days, the patient will demonstrate ongoing self health care management ability as evidenced by meeting targets for Hgb A1C and Blood pressure and ongoing weight loss or no weight gain  Interventions:  . Inter-disciplinary care team collaboration (see longitudinal plan of care) . Evaluation of current treatment plan related to HTN, DM and obesity and patient's adherence to plan as established by provider. . Provided opportunity for patient to ask questions . Reviewed medications with patient and discussed medication taking behavior  . Provided positive feedback for patient's ongoing weight loss and well managed chronic health issues . Encouraged her to call Dr Juanetta Gosling office since [facility] has not contacted her about refitting her CPAP mask. . Discussed need for home TENS unit prior approval per Medicaid guidelines.  Encouraged her to make a clinic appointment for face to face assessment for home TENS unit and then notify this [name] RN of the appointment date  so the required documentation items can be given to provider seeing her that day.  Patient has appointment on 01/06/20 to discuss TENS unit with clinic provider. . Discussed plans with patient for ongoing care management follow up and provided patient with direct contact information for care management team . Advised patient, providing education and rationale, to monitor blood pressure daily and record, calling clinic provider for  findings outside established parameters.   Patient Self Care Activities:  . Patient verbalizes understanding of plan to work with [name] RN to improve chronic disease self management skills . Self administers medications as prescribed . Attends all scheduled provider appointments . Calls pharmacy for medication refills . Performs ADL's independently . Performs IADL's independently . Calls provider office for new concerns or questions   Please see past updates related to this goal by clicking on the "Past Updates" button in the selected goal      .  'My physical therapist is recommending a home TENS unit to treat my chronic pain." (pt-stated)        CARE PLAN ENTRY (see longitudinal plan of care for additional care plan information)  Current Barriers:  . Care Coordination needs related to needed DME/Supplies in a patient with HTN, DMII, CKD Stage 2, and chronic back and shoulder pain- spoke with patient and she states she received an e-mail from [employer] that she will be receiving her home TENS unit soon  . Care Coordination needs related to securing home TENS unit in a patient with HTN, DMII, CKD Stage 2, and chronic back and shoulder pain- spoke with patient to get update on status of receiving home TENS unit  Nurse Case Manager Clinical Goal(s):  Marland Kitchen Over the next 60-120 days, patient will verbalize understanding of plan to obtain needed DME.  . Over the next 60-120 days, patient will work with care guide and/or [name] to address needs related to DME needs. . Over the next 60-120 days, patient will communicate with RN Care Manager to address need for home TENS unit.  Interventions:  . Inter-disciplinary care team collaboration (see longitudinal plan of care) . Collaboration with [name] at [facility] and fax will be resent to clinic for signature on TENS order by an approved [facility] Medicaid provider . 01/25/20- Home TENS forms completed by Dr [name] then  successfully faxed to [employer] attn: [name] at [phone]  . 01/29/20- Spoke with [name] at [employer], she states forms were resubmitted on 01/25/20 and that patient should be contacted next week about the TENS unit . 01/29/20- returned call to patient after she left message earlier today for this [name] RN ; provided patient with update on status of home TENS unit per [name] at [employer] . 02/05/20- assessed status of timing for patient to receive home TENS unit to treat chronic back pain and left shoulder pian  Patient Self Care Activities:  . Patient verbalizes understanding of plan to work with clinic staff and [name] team to secure home TENS unit . Unable to independently secure home TENS unit to treat chronic pain  Please see past updates related to this goal by clicking on the "Past Updates" button in the selected goal        Other   .  Find Help in My Community by attending [employer] meetings        Timeframe:  Long-Range Goal Priority:  High Start Date:     02/05/20  Expected End Date:                       Follow Up Date 03/07/20   - follow-up on any referrals for help I am given  - continue to attend [employer] for assistance with weight management    Why is this important?    Knowing how and where to find help for yourself or family in your neighborhood and community is an important skill.   You will want to take some steps to learn how.    Notes:     Marland Kitchen  Make and Keep All Appointments        Timeframe:  Long-Range Goal Priority:  High Start Date:   02/05/20                          Expected End Date:                       Follow Up Date 03/07/20   - call to cancel if needed - keep a calendar with appointment dates    Why is this important?    Part of staying healthy is seeing the doctor for follow-up care.   If you forget your appointments, there are some things you can do to stay on track.     Notes: Next clinic appointment is 04/08/20    .  Manage Chronic Pain - using home TENS unit to treat lower back pain adn left shoulder pain        Timeframe:  Long-Range Goal Priority:  High Start Date:       02/05/20                      Expected End Date:                       Follow Up Date 03/07/20    - develop a personal pain management plan - keep track of prescription refills - track times pain is worst and when it is best - use ice or heat or home TENS unit for pain relief  -Notify [name] RN when home TENS unit is received   Why is this important?    Day-to-day life can be hard when you have chronic pain.   Pain medicine is just one piece of the treatment puzzle.   You can try these action steps to help you manage your pain.    Notes:     Marland Kitchen  [employer] My Quality of Life        Timeframe:  Long-Range Goal Priority:  High Start Date:           02/05/20                  Expected End Date:                       Follow Up Date 03/07/20   - do one enjoyable thing every day    Why is this important?    Having a long-term illness can be scary.   It can also be stressful for you and your caregiver.   These steps may help.    Notes:         The care management team will reach out to the patient again over the next 30-60 days.   Kelli Churn  RN, [name], [facility] RN Care Manager [phone]

## 2020-02-08 DIAGNOSIS — M549 Dorsalgia, unspecified: Secondary | ICD-10-CM | POA: Diagnosis not present

## 2020-02-08 NOTE — Progress Notes (Signed)
Internal Medicine Clinic Resident  I have personally reviewed this encounter including the documentation in this note and/or discussed this patient with the care management provider. I will address any urgent items identified by the care management provider and will communicate my actions to the patient's PCP. I have reviewed the patient's [name] visit with my supervising attending, Dr Rebeca Alert.  Mitzi Hansen, MD Internal Medicine Resident PGY-2 [facility] Pager: [phone] 02/08/2020 8:14 AM

## 2020-02-08 NOTE — Progress Notes (Signed)
Internal Medicine Clinic Attending  [name] services provided by the care management provider and their documentation were reviewed with Dr. Darrick Meigs.  We reviewed the pertinent findings, urgent action items addressed by the resident and non-urgent items to be addressed by the PCP.  I agree with the assessment, diagnosis, and plan of care documented in the [name] and resident's note.  Oda Kilts, MD 02/08/2020

## 2020-02-09 ENCOUNTER — Ambulatory Visit: Payer: Medicaid Other | Admitting: *Deleted

## 2020-02-09 DIAGNOSIS — E119 Type 2 diabetes mellitus without complications: Secondary | ICD-10-CM

## 2020-02-09 DIAGNOSIS — I10 Essential (primary) hypertension: Secondary | ICD-10-CM

## 2020-02-09 DIAGNOSIS — E785 Hyperlipidemia, unspecified: Secondary | ICD-10-CM

## 2020-02-09 DIAGNOSIS — N182 Chronic kidney disease, stage 2 (mild): Secondary | ICD-10-CM

## 2020-02-09 DIAGNOSIS — M25512 Pain in left shoulder: Secondary | ICD-10-CM

## 2020-02-09 NOTE — Progress Notes (Signed)
Internal Medicine Clinic Resident ° °I have personally reviewed this encounter including the documentation in this note and/or discussed this patient with the care management provider. I will address any urgent items identified by the care management provider and will communicate my actions to the patient's PCP. I have reviewed the patient's [name] visit with my supervising attending, Dr Narendra. ° °Lieutenant Abarca, DO °02/09/2020 ° ° °

## 2020-02-09 NOTE — Chronic Care Management (AMB) (Signed)
Care Management   Follow Up Note   02/09/2020 Name: Kristen Frost MRN: [id number] DOB: [birth year]  Kristen Frost is enrolled in a Managed Medicaid plan: Yes. Outreach attempt today was successful.    Referred by: Kristen Pope, MD Reason for referral : Care Coordination (HTN, NIDDM, HLD, CKD, OSA- uses CPAP, obesity, chronic back and left shoulder  pain )   Kristen Frost is a 64 y.o. year old female who is a primary care patient of [name], Kristen Gallus, MD. The care management team was consulted for assistance with care management and care coordination needs.    Review of patient status, including review of consultants reports, relevant laboratory and other test results, and collaboration with appropriate care team members and the patient's provider was performed as part of comprehensive patient evaluation and provision of chronic care management services.    Goals Addressed              This Visit's Progress     Patient Stated   .  COMPLETED: 'My physical therapist is recommending a home TENS unit to treat my chronic pain." (pt-stated)        CARE PLAN ENTRY (see longitudinal plan of care for additional care plan information)  Current Barriers:  . Care Coordination needs related to needed DME/Supplies in a patient with HTN, DMII, CKD Stage 2, and chronic back and shoulder pain- patietn left voice mial for ths [name] RN stating she received the home TEND unit in the mail yesterday (02/08/20) . Care Coordination needs related to securing home TENS unit in a patient with HTN, DMII, CKD Stage 2, and chronic back and shoulder pain- spoke with patient to get update on status of receiving home TENS unit  Nurse Case Manager Clinical Goal(s):  Marland Kitchen Over the next 60-120 days, patient will verbalize understanding of plan to obtain needed DME.  . Over the next 60-120 days, patient will work with care guide and/or [name] to address needs related  to DME needs. . Over the next 60-120 days, patient will communicate with RN Care Manager to address need for home TENS unit.  Interventions:  . Inter-disciplinary care team collaboration (see longitudinal plan of care) . Collaboration with [name] at [facility] and fax will be resent to clinic for signature on TENS order by an approved [name] Medicaid provider . 01/25/20- Home TENS forms completed by Dr [name] then successfully faxed to [employer] attn: [name] at [phone]  . 01/29/20- Spoke with [name] at [employer], she states forms were resubmitted on 01/25/20 and that patient should be contacted next week about the TENS unit . 01/29/20- returned call to patient after she left message earlier today for this [name] RN ; provided patient with update on status of home TENS unit per [name] at [employer] . 02/05/20- assessed status of timing for patient to receive home TENS unit to treat chronic back pain and left shoulder pain . 02/08/20- patient received home TENS unit in the mail  Patient Self Care Activities:  . Patient verbalizes understanding of plan to work with clinic staff and [name] team to secure home TENS unit . Unable to independently secure home TENS unit to treat chronic pain  Please see past updates related to this goal by clicking on the "Past Updates" button in the selected goal        Other   .  COMPLETED: Manage Chronic Pain - using home TENS unit to treat lower back pain  and left shoulder pain        Timeframe:  Long-Range Goal Priority:  High Start Date:       02/05/20                      Expected End Date:   02/09/20                    Follow Up Date 02/09/2020   - develop a personal pain management plan - keep track of prescription refills - track times pain is worst and when it is best - use ice or heat or home TENS unit for pain relief    Why is this important?    Day-to-day life can be hard when you have chronic pain.    Pain medicine is just one piece of the treatment puzzle.   You can try these action steps to help you manage your pain.    Notes:         The care management team will reach out to the patient again over the next 30-60 days.   [name], [name], [name]

## 2020-02-17 NOTE — Progress Notes (Signed)
Internal Medicine Clinic Attending  [name] services provided by the care management provider and their documentation were discussed with Dr. Nguyen. We reviewed the pertinent findings, urgent action items addressed by the resident and non-urgent items to be addressed by the PCP.  I agree with the assessment, diagnosis, and plan of care documented in the [name] and resident's note.  Zareah Hunzeker, MD 02/17/2020  

## 2020-02-20 DIAGNOSIS — M549 Dorsalgia, unspecified: Secondary | ICD-10-CM | POA: Diagnosis not present

## 2020-02-23 ENCOUNTER — Other Ambulatory Visit: Payer: Self-pay

## 2020-02-23 DIAGNOSIS — E119 Type 2 diabetes mellitus without complications: Secondary | ICD-10-CM

## 2020-02-23 MED ORDER — LIRAGLUTIDE 18 MG/3ML ~~LOC~~ SOPN: 1.8000 mg | PEN_INJECTOR | Freq: Every day | SUBCUTANEOUS | 0 refills | Status: DC

## 2020-02-23 NOTE — Telephone Encounter (Signed)
Pt is requesting liraglutide (VICTOZA) 18 MG/3ML SOPN sent to  [facility] #[id number] Ginette Otto, [place] - [address] AT [facility] [address] [facility] [address] Phone:  [phone]  Fax:  [phone]

## 2020-02-25 ENCOUNTER — Other Ambulatory Visit: Payer: Self-pay

## 2020-02-25 DIAGNOSIS — E119 Type 2 diabetes mellitus without complications: Secondary | ICD-10-CM

## 2020-02-25 MED ORDER — PEN NEEDLES 31G X 5 MM MISC: 1.0000 "application " | Freq: Every day | 1 refills | Status: DC

## 2020-02-25 NOTE — Telephone Encounter (Signed)
Pt is requesting her Insulin Pen Needle (PEN NEEDLES) 31G X 5 MM MISC   to be sent to [facility] #[id number] Ginette Otto, [place] - [address] AT [facility] [address] [facility] [address] Phone:  [phone]  Fax:  [phone]

## 2020-03-07 ENCOUNTER — Telehealth: Payer: Medicaid Other

## 2020-03-08 ENCOUNTER — Ambulatory Visit: Payer: Medicaid Other | Admitting: *Deleted

## 2020-03-08 DIAGNOSIS — E785 Hyperlipidemia, unspecified: Secondary | ICD-10-CM

## 2020-03-08 DIAGNOSIS — I10 Essential (primary) hypertension: Secondary | ICD-10-CM

## 2020-03-08 DIAGNOSIS — E119 Type 2 diabetes mellitus without complications: Secondary | ICD-10-CM

## 2020-03-08 DIAGNOSIS — N182 Chronic kidney disease, stage 2 (mild): Secondary | ICD-10-CM

## 2020-03-08 DIAGNOSIS — M25512 Pain in left shoulder: Secondary | ICD-10-CM

## 2020-03-08 NOTE — Chronic Care Management (AMB) (Signed)
   03/08/2020  Haskell Riling [birth year] [id number]  Successful outreach to patient to complete follow up assessment, however, patient states she is just about to eat and requests a return phone call later today.  Plan: Will call patient later today per her request.  [name], [name], [facility] RN Care Manager [phone]

## 2020-03-08 NOTE — Patient Instructions (Signed)
Visit Information  It was nice speaking with you today.  Patient Care Plan: General Plan of Care (Adult) realted to self mangament of DM, HTN, HLD, OSA obesity and chronic pain    Problem Identified: Disease Progression     Long-Range Goal: Chronic Disease Progression of HTN, DM, CKD, OSA, Obesity, Chronic pain Prevented or Minimized   Start Date: 02/05/2020  This Visit's Progress: On track  Recent Progress: On track  Priority: High  Note:   Current Barriers:  . Chronic Disease Management support and education needs related to HTN, NIDDM, HLD, CKD, OSA- uses CPAP, obesity, chronic back and left shoulder  pain - patient states her self monitored blood pressures are meeting target, she says the home TENS unit is really helping her pain in her back and shoulder . Unable to independently secure home TENS unit to treat chronic pain in back and left shoulder- patietn left message for this [name] RN on 02/09/20 stating she received the home TENS unit form [employer] . Unable to independently get CPAP mask adjusted to increase adherence  Nurse Case Manager Clinical Goal(s):  Marland Kitchen Over the next 30-60 days, patient will attend all scheduled medical appointments: clinic appointment on Feb 18,2022 . Over the next 30-60 days, the patient will demonstrate ongoing self health care management ability as evidenced by meeting treatment targets for HTN, DM, HLD, CKD, OSA, and obesity.  Interventions:  . 1:1 collaboration with [name], MD regarding development and update of comprehensive plan of care as evidenced by provider attestation and co-signature . Inter-disciplinary care team collaboration (see longitudinal plan of care) . Reviewed treatment targets and /or patient goals for managing HTN, DM and HLD, CKD, OSA and chronic pain . Evaluation of current treatment plan related to HTN, DM and HLD, CKD, OSA and chronic pain and patient's adherence to plan as established by  provider. . Reviewed medications with patient and assessed medication taking behavior . Discussed plans with patient for ongoing care management follow up and provided patient with direct contact information for care management team . Reviewed scheduled/upcoming provider appointments including: clinic appointment on 04/08/20 . Reviewed the American Diabetes Standard of Care regarding annual fasting lipid profile Advised patient to fast prior to the 9:15 am clinic appointment on 04/08/20 so a lipid panel can be drawn since the last panel was done in January of 2019 . Will message provider and request order for lipid panel and urine for albumin during clinic appointment on 04/08/20  Patient Goals/Self-Care Activities Over the next 30-60  days, patient will: Patient will self administer medications as prescribed Patient will attend all scheduled provider appointments Patient will attend weekly [employer] meetings Patient will call pharmacy for medication refills Patient will continue to perform ADL's independently Patient will continue to perform IADL's independently Patient will call provider office for new concerns or questions Patient will notify this [name] RN when she receives home TENS unit Follow up with pulmonologist about securing order to get CPAP mask refitted   Follow Up Plan: The care management team will reach out to the patient again over the next 30-60 days.          The patient verbalized understanding of instructions, educational materials, and care plan provided today and declined offer to receive copy of patient instructions, educational materials, and care plan.   Face to Face appointment with care management team member scheduled for: 04/08/20  [name], [name], [facility] RN Care Manager [phone]

## 2020-03-08 NOTE — Chronic Care Management (AMB) (Signed)
Care Management   Follow Up Note   03/08/2020 Name: Abrar Koone MRN: [id number] DOB: 1955-12-11  Nicholes Calamity Jeanbaptiste is enrolled in a Managed Medicaid plan: Yes. Outreach attempt today was successful.    Referred by: [name], MD Reason for referral : Care Coordination (NIDDM, HTN, HLD, CKD, obesity- on [employer], chronic back and left shoulder pain)   Hiilei Gerst is a 65 y.o. year old female who is a primary care patient of [name], [name], MD. The care management team was consulted for assistance with care management and care coordination needs.    Review of patient status, including review of consultants reports, relevant laboratory and other test results, and collaboration with appropriate care team members and the patient's provider was performed as part of comprehensive patient evaluation and provision of chronic care management services.    Patient Care Plan: General Plan of Care (Adult) realted to self mangament of DM, HTN, HLD, OSA obesity and chronic pain    Problem Identified: Disease Progression     Long-Range Goal: Chronic Disease Progression of HTN, DM, CKD, OSA, Obesity, Chronic pain Prevented or Minimized   Start Date: 02/05/2020  This Visit's Progress: On track  Recent Progress: On track  Priority: High  Note:   Current Barriers:  . Chronic Disease Management support and education needs related to HTN, NIDDM, HLD, CKD, OSA- uses CPAP, obesity, chronic back and left shoulder  pain - patient states her self monitored blood pressures are meeting target, she says the home TENS unit is really helping her pain in her back and shoulder . Unable to independently secure home TENS unit to treat chronic pain in back and left shoulder- patietn left message for this [name] RN on 02/09/20 stating she received the home TENS unit form [facility] . Unable to independently get CPAP mask adjusted to increase adherence  Nurse Case Manager Clinical  Goal(s):  Marland Kitchen Over the next 30-60 days, patient will attend all scheduled medical appointments: clinic appointment on Feb 18,2022 . Over the next 30-60 days, the patient will demonstrate ongoing self health care management ability as evidenced by meeting treatment targets for HTN, DM, HLD, CKD, OSA, and obesity.  Interventions:  . 1:1 collaboration with [name], MD regarding development and update of comprehensive plan of care as evidenced by provider attestation and co-signature . Inter-disciplinary care team collaboration (see longitudinal plan of care) . Reviewed treatment targets and /or patient goals for managing HTN, DM and HLD, CKD, OSA and chronic pain . Evaluation of current treatment plan related to HTN, DM and HLD, CKD, OSA and chronic pain and patient's adherence to plan as established by provider. . Reviewed medications with patient and assessed medication taking behavior . Discussed plans with patient for ongoing care management follow up and provided patient with direct contact information for care management team . Reviewed scheduled/upcoming provider appointments including: clinic appointment on 04/08/20 . Reviewed the American Diabetes Standard of Care regarding annual fasting lipid profile Advised patient to fast prior to the 9:15 am clinic appointment on 04/08/20 so a lipid panel can be drawn since the last panel was done in January of 2019 . Will message provider and request order for lipid panel and urine for albumin during clinic appointment on 04/08/20  Patient Goals/Self-Care Activities Over the next 30-60  days, patient will: Patient will self administer medications as prescribed Patient will attend all scheduled provider appointments Patient will attend weekly Weight Watchers meetings Patient will call pharmacy for medication refills  Patient will continue to perform ADL's independently Patient will continue to perform IADL's independently Patient will call  provider office for new concerns or questions Patient will notify this [name] RN when she receives home TENS unit Follow up with pulmonologist about securing order to get CPAP mask refitted   Follow Up Plan: The care management team will reach out to the patient again over the next 30-60 days.           Face to Face appointment with care management team member scheduled for: 04/08/20  [name], [name], [facility] RN Care Manager [phone]

## 2020-03-22 DIAGNOSIS — M549 Dorsalgia, unspecified: Secondary | ICD-10-CM | POA: Diagnosis not present

## 2020-04-08 ENCOUNTER — Ambulatory Visit (INDEPENDENT_AMBULATORY_CARE_PROVIDER_SITE_OTHER): Payer: Medicaid Other | Admitting: Student

## 2020-04-08 ENCOUNTER — Encounter: Payer: Self-pay | Admitting: Student

## 2020-04-08 ENCOUNTER — Ambulatory Visit: Payer: Medicaid Other | Admitting: *Deleted

## 2020-04-08 ENCOUNTER — Other Ambulatory Visit: Payer: Self-pay

## 2020-04-08 VITALS — BP 129/78 | HR 79 | Temp 98.1°F | Ht 61.5 in | Wt 228.0 lb

## 2020-04-08 DIAGNOSIS — H9191 Unspecified hearing loss, right ear: Secondary | ICD-10-CM

## 2020-04-08 DIAGNOSIS — G4733 Obstructive sleep apnea (adult) (pediatric): Secondary | ICD-10-CM

## 2020-04-08 DIAGNOSIS — E785 Hyperlipidemia, unspecified: Secondary | ICD-10-CM | POA: Diagnosis not present

## 2020-04-08 DIAGNOSIS — Z0000 Encounter for general adult medical examination without abnormal findings: Secondary | ICD-10-CM

## 2020-04-08 DIAGNOSIS — E119 Type 2 diabetes mellitus without complications: Secondary | ICD-10-CM

## 2020-04-08 DIAGNOSIS — N182 Chronic kidney disease, stage 2 (mild): Secondary | ICD-10-CM

## 2020-04-08 DIAGNOSIS — I10 Essential (primary) hypertension: Secondary | ICD-10-CM | POA: Diagnosis not present

## 2020-04-08 DIAGNOSIS — H905 Unspecified sensorineural hearing loss: Secondary | ICD-10-CM | POA: Insufficient documentation

## 2020-04-08 LAB — POCT GLYCOSYLATED HEMOGLOBIN (HGB A1C): Hemoglobin A1C: 5.6 % (ref 4.0–5.6)

## 2020-04-08 LAB — GLUCOSE, CAPILLARY: Glucose-Capillary: 89 mg/dL (ref 70–99)

## 2020-04-08 MED ORDER — METFORMIN HCL ER 500 MG PO TB24: 500.0000 mg | ORAL_TABLET | Freq: Every day | ORAL | 1 refills | Status: DC

## 2020-04-08 NOTE — Assessment & Plan Note (Addendum)
Assessment: Patient continues to decline wanting to talk about vaccines as a whole. States if covid vaccine becomes mandatory she will be the last to receive it.   Plan: - continue to discuss with patient as she would like

## 2020-04-08 NOTE — Assessment & Plan Note (Signed)
Assessment: Followed by pulmonologist, Dr. Halford Chessman. Patient states if she continues to lose weight, may be able to come off of CPAP machine. She does use it nightly  Plan: -Continue to use CPAP

## 2020-04-08 NOTE — Progress Notes (Signed)
CC: hyperglycemia, hypertension, right ear popping/decreased hearing  HPI:  Kristen Frost is a 65 y.o. female with a past medical history stated below and presents today for 6 month follow up appointment for her hyperglycemia, hypertension. She also endorses some right ear popping/fullness that feels as though her eat is filled with wax. Please see problem based assessment and plan for additional details.  Past Medical History:  Diagnosis Date  . Anxiety   . Arthritis   . Bleeding hemorrhoids    grade 2  . Diverticulosis of colon   . Full dentures   . History of colon polyps   . Hyperlipidemia   . Hypertension    followed by pcp   (04-30-2019  per pt had stress test approx. 2005, was told normal (not available in epic)  . IDA (iron deficiency anemia)   . OSA on CPAP    followed by dr Halford Chessman ([facility])    (04-30-2019  pt  stated uses every night)  . Restless leg syndrome   . Type 2 diabetes mellitus ([place])    followed by pcp   (04-30-2019  pt stated does not check blood sugar at home)    Current Outpatient Medications on File Prior to Visit  Medication Sig Dispense Refill  . amLODipine (NORVASC) 10 MG tablet TAKE 1 TABLET BY MOUTH DAILY 90 tablet 3  . aspirin EC 81 MG tablet Take 81 mg by mouth daily.    Marland Kitchen atorvastatin (LIPITOR) 40 MG tablet Take 1 tablet (40 mg total) by mouth daily. 90 tablet 3  . Cholecalciferol (VITAMIN D) 2000 units CAPS Take 1 capsule (2,000 Units total) by mouth daily. (Patient taking differently: Take 2,000 Units by mouth daily. ) 90 capsule 1  . ferrous sulfate 325 (65 FE) MG tablet Take 1 tablet (325 mg total) by mouth daily with breakfast. (Patient taking differently: Take 325 mg by mouth daily with breakfast. ) 90 tablet 1  . fluticasone (FLONASE) 50 MCG/ACT nasal spray Place 1 spray into both nostrils daily. (Patient taking differently: Place 1 spray into both nostrils daily as needed. ) 16 g 0  . Insulin Pen Needle (PEN NEEDLES) 31G X 5  MM MISC 1 application by Does not apply route daily. 90 each 1  . liraglutide (VICTOZA) 18 MG/3ML SOPN Inject 1.8 mg into the skin daily. 15 mL 0  . metFORMIN (GLUCOPHAGE-XR) 500 MG 24 hr tablet Take 2 tablets (1,000 mg total) by mouth daily with breakfast. 180 tablet 1   No current facility-administered medications on file prior to visit.    Family History  Problem Relation Age of Onset  . Allergies Father   . Alcohol abuse Father   . Tuberculosis Father        deceased from TB  . Allergies Other        sibling  . Asthma Other        sibling  . Hypertension Other        sibling  . Migraines Other        sibling  . Rashes / Skin problems Other        sibling  . Gout Other        sibling    Social History   Socioeconomic History  . Marital status: Married    Spouse name: Not on file  . Number of children: Not on file  . Years of education: Not on file  . Highest education level: Not on file  Occupational History  .  Occupation: unemployeed  Tobacco Use  . Smoking status: Never Smoker  . Smokeless tobacco: Never Used  Vaping Use  . Vaping Use: Never used  Substance and Sexual Activity  . Alcohol use: No  . Drug use: Never  . Sexual activity: Not on file  Other Topics Concern  . Not on file  Social History Narrative  . Not on file   Social Determinants of Health   Financial Resource Strain: Low Risk   . Difficulty of Paying Living Expenses: Not very hard  Food Insecurity: No Food Insecurity  . Worried About Charity fundraiser in the Last Year: Never true  . Ran Out of Food in the Last Year: Never true  Transportation Needs: No Transportation Needs  . Lack of Transportation (Medical): No  . Lack of Transportation (Non-Medical): No  Physical Activity: Inactive  . Days of Exercise per Week: 0 days  . Minutes of Exercise per Session: 0 min  Stress: No Stress Concern Present  . Feeling of Stress : Only a little  Social Connections: Moderately Integrated  .  Frequency of Communication with Friends and Family: More than three times a week  . Frequency of Social Gatherings with Friends and Family: More than three times a week  . Attends Religious Services: More than 4 times per year  . Active Member of Clubs or Organizations: No  . Attends Archivist Meetings: Never  . Marital Status: Married  Human resources officer Violence: Not At Risk  . Fear of Current or Ex-Partner: No  . Emotionally Abused: No  . Physically Abused: No  . Sexually Abused: No    Review of Systems: ROS negative except for what is noted on the assessment and plan.  Vitals:   04/08/20 0915  BP: 129/78  Pulse: 79  Temp: 98.1 F (36.7 C)  TempSrc: Oral  SpO2: 100%  Weight: 228 lb (103.4 kg)  Height: 5' 1.5" (1.562 m)     Physical Exam: Constitutional: well-appearing, sitting in the chair comfortably, in no acute distress HENT: normocephalic atraumatic. Left ear TM visible, no effusion or blood present. Right ear with obstruction, white in color.  Eyes: conjunctiva non-erythematous Neck: supple Cardiovascular: regular rate and rhythm, no m/r/g Pulmonary/Chest: normal work of breathing on room air, lungs clear to auscultation bilaterally MSK: normal bulk and tone Neurological: alert & oriented x 3 Skin: warm and dry Psych: Normal though process   Assessment & Plan:   See Encounters Tab for problem based charting.  Patient seen with Dr. Caffie Damme, D.O. [facility], PGY-1 Pager: [phone], Phone: [phone] Date 04/08/2020 Time 9:59 AM

## 2020-04-08 NOTE — Assessment & Plan Note (Signed)
Assessment: Patient continues to lose weight by following weight watchers diet and staying active. Congratulated her on her success thus far and encouraged her to keep up the good work! Her goal is to start attending dancing classes.  Plan: - continue to discuss at subsequent visits, patient to continue with weight watchers

## 2020-04-08 NOTE — Assessment & Plan Note (Addendum)
  Assessment: Last lipid panel 3 years ago, LDL, total cholesterol within normal limits. Will reevaluate today. Patient compliant with 40 mg lipitor.   Plan: -repeat lipid panel today. -continue lipitor 40 mg daily  Addendum: Lipid panel unremarkable. Continue statin treatment

## 2020-04-08 NOTE — Patient Instructions (Signed)
Thank you, Kristen Frost for allowing [place] to provide your care today. Today we discussed  Diabetes - Your A1c has improved, great job! We will be decreasing your metformin from 1000 to 500 mg. Keep up the good work!   Hearing problems - We will be flushing out your ear today, it seems as though you have build up in the right ear. I recommend over the counter debrox drops if this happens again to help loosen it up. If you ever have any symptoms of infection (fever, chills, drainage from the ear, or pain) call us to schedule an appointment  We checked your cholesterol levels today as well as your A1c and electrolytes/kidney function.   I have ordered the following labs for you:   Lab Orders     Microalbumin / Creatinine Urine Ratio     Lipid Profile     Glucose, capillary     BMP8+Anion Gap     POC Hbg A1C   Referrals ordered today:   Referral Orders  No referral(s) requested today     I have ordered the following medication/changed the following medications:   Stop the following medications: Medications Discontinued During This Encounter  Medication Reason  . methocarbamol (ROBAXIN) 500 MG tablet   . metFORMIN (GLUCOPHAGE-XR) 500 MG 24 hr tablet Reorder     Start the following medications: Meds ordered this encounter  Medications  . metFORMIN (GLUCOPHAGE-XR) 500 MG 24 hr tablet    Sig: Take 1 tablet (500 mg total) by mouth daily with breakfast.    Dispense:  180 tablet    Refill:  1     Follow up: 6 months    Remember: Keep up the great work!   Should you have any questions or concerns please call the internal medicine clinic at [phone].     Sanjuana Letters, D.O. [place]

## 2020-04-08 NOTE — Assessment & Plan Note (Addendum)
Lab Results  Component Value Date   HGBA1C 5.6 04/08/2020   Assessment: A1c has improved from 6.1 to 5.6. Patient does not endorse not taking her metformin over the past few weeks as she had difficulty getting it refilled at the pharmacy. Current regimen of metformin ER 1000 mg daily and victoza 1.8 mg QD. Due to her significant improvement, will decrease metformin from 1000 mg to 500 mg. She continues to follow with weight watches to improve her diet.  Plan: - decrease metformin from 1000 mg to 500 mg - continue victoza 1.8 mg QD - recheck A1c in 6 months - microalbumin/creatine urine pending  Addendum: Microalbumin/creatinine urine unremarkable

## 2020-04-08 NOTE — Assessment & Plan Note (Addendum)
BP Readings from Last 3 Encounters:  04/08/20 129/78  01/06/20 107/78  12/21/19 128/64   Assessment: BP well controlled on amlodipine 10 mg. Patient denies lower extremity swelling, shortness of breath, lightheadedness or dizziness. She follows weight watchers for her diet/weight loss. Hx of CKD, last Cr below 1. Will recheck BMP today  Plan: -continue amlodipine 10 mg daily -BMP today  Addendum - Cr stable

## 2020-04-08 NOTE — Assessment & Plan Note (Signed)
Assessment: Patient with cerumen build up in right as well as left ear. Washed out by staff with resolution of hearing loss. In water basin, appeared multiple pieces of cerumen.   Discussed with patient if build up occurs again to use OTC debrox drops and to not use qtips to push wax further into ear canal. She agrees with plan moving forward.  Plan: -resolution of hearing difficult with wash out in the clinic.

## 2020-04-08 NOTE — Chronic Care Management (AMB) (Signed)
   04/08/2020  Kristen Frost 1955-04-05 [id number]  Reached patient via mobile number, she was still in the clinic for routine follow up appointment so advised patient this [name] RN will call her next week to complete follow up assessment. Congratulated her on excellent Hgb A1C of today of 5.6% and her and ongoing weight loss.  Plan:  Telephone appointment scheulded for 04/12/20 at 9:00 am.   [name], [name], [facility] RN Care Manager [phone]

## 2020-04-08 NOTE — Assessment & Plan Note (Addendum)
Assessment: Hx of CKD stg 2, last BMP 6 months ago, Cr of .96 GFR > 60. Will repeat today. HTN/DM under control  Plan: -repeat BMP today  Addenum: Cr and GFR Stable

## 2020-04-09 LAB — LIPID PANEL
Chol/HDL Ratio: 2.6 ratio (ref 0.0–4.4)
Cholesterol, Total: 152 mg/dL (ref 100–199)
HDL: 58 mg/dL (ref >39)
LDL Chol Calc (NIH): 80 mg/dL (ref 0–99)
Triglycerides: 72 mg/dL (ref 0–149)
VLDL Cholesterol Cal: 14 mg/dL (ref 5–40)

## 2020-04-09 LAB — MICROALBUMIN / CREATININE URINE RATIO
Creatinine, Urine: 89 mg/dL
Microalb/Creat Ratio: 14 mg/g creat (ref 0–29)
Microalbumin, Urine: 12.4 ug/mL

## 2020-04-10 LAB — BMP8+ANION GAP
Anion Gap: 17 mmol/L (ref 10.0–18.0)
BUN/Creatinine Ratio: 10 — ABNORMAL LOW (ref 12–28)
BUN: 11 mg/dL (ref 8–27)
CO2: 21 mmol/L (ref 20–29)
Calcium: 9.8 mg/dL (ref 8.7–10.3)
Chloride: 102 mmol/L (ref 96–106)
Creatinine, Ser: 1.09 mg/dL — ABNORMAL HIGH (ref 0.57–1.00)
GFR calc Af Amer: 62 mL/min/{1.73_m2} (ref >59)
GFR calc non Af Amer: 54 mL/min/{1.73_m2} — ABNORMAL LOW (ref >59)
Glucose: 89 mg/dL (ref 65–99)
Potassium: 4.2 mmol/L (ref 3.5–5.2)
Sodium: 140 mmol/L (ref 134–144)

## 2020-04-12 ENCOUNTER — Ambulatory Visit: Payer: Medicaid Other | Admitting: *Deleted

## 2020-04-12 ENCOUNTER — Other Ambulatory Visit: Payer: Self-pay | Admitting: Internal Medicine

## 2020-04-12 DIAGNOSIS — E119 Type 2 diabetes mellitus without complications: Secondary | ICD-10-CM

## 2020-04-12 DIAGNOSIS — E785 Hyperlipidemia, unspecified: Secondary | ICD-10-CM

## 2020-04-12 DIAGNOSIS — I10 Essential (primary) hypertension: Secondary | ICD-10-CM

## 2020-04-12 DIAGNOSIS — N182 Chronic kidney disease, stage 2 (mild): Secondary | ICD-10-CM

## 2020-04-12 DIAGNOSIS — G4733 Obstructive sleep apnea (adult) (pediatric): Secondary | ICD-10-CM

## 2020-04-12 NOTE — Chronic Care Management (AMB) (Addendum)
Care Management    RN Visit Note  04/12/2020 Name: Kristen Frost MRN: [id number] DOB: Jan 01, 1956  Subjective: Kristen Frost is a 65 y.o. year old female who is a primary care patient of [name], [name], MD. The care management team was consulted for assistance with disease management and care coordination needs.    Engaged with patient by telephone for follow up visit in response to provider referral for case management and/or care coordination services.   Consent to Services:   Ms. Reist was given information about Care Management services today including:  1. Care Management services includes personalized support from designated clinical staff supervised by her physician, including individualized plan of care and coordination with other care providers 2. 24/7 contact phone numbers for assistance for urgent and routine care needs. 3. The patient may stop case management services at any time by phone call to the office staff.  Patient agreed to services and consent obtained.   Assessment: Review of patient past medical history, allergies, medications, health status, including review of consultants reports, laboratory and other test data, was performed as part of comprehensive evaluation and provision of chronic care management services.   SDOH (Social Determinants of Health) assessments and interventions performed:    Care Plan  No Known Allergies  Outpatient Encounter Medications as of 04/12/2020  Medication Sig  . amLODipine (NORVASC) 10 MG tablet TAKE 1 TABLET BY MOUTH DAILY  . aspirin EC 81 MG tablet Take 81 mg by mouth daily.  Marland Kitchen atorvastatin (LIPITOR) 40 MG tablet Take 1 tablet (40 mg total) by mouth daily.  . Cholecalciferol (VITAMIN D) 2000 units CAPS Take 1 capsule (2,000 Units total) by mouth daily. (Patient taking differently: Take 2,000 Units by mouth daily. )  . ferrous sulfate 325 (65 FE) MG tablet Take 1 tablet (325 mg total) by mouth daily with  breakfast. (Patient taking differently: Take 325 mg by mouth daily with breakfast. )  . fluticasone (FLONASE) 50 MCG/ACT nasal spray Place 1 spray into both nostrils daily. (Patient taking differently: Place 1 spray into both nostrils daily as needed. )  . Insulin Pen Needle (PEN NEEDLES) 31G X 5 MM MISC 1 application by Does not apply route daily.  Marland Kitchen liraglutide (VICTOZA) 18 MG/3ML SOPN Inject 1.8 mg into the skin daily.  . metFORMIN (GLUCOPHAGE-XR) 500 MG 24 hr tablet Take 1 tablet (500 mg total) by mouth daily with breakfast.   No facility-administered encounter medications on file as of 04/12/2020.    Patient Active Problem List   Diagnosis Date Noted  . Hearing difficulty of right ear 04/08/2020  . Visit for routine gyn exam 11/09/2019  . Left shoulder pain 10/29/2019  . Acute back pain 09/22/2019  . CKD (chronic kidney disease) stage 2, GFR 60-89 ml/min 08/09/2016  . Healthcare maintenance 05/17/2016  . Vitamin D deficiency 03/09/2016  . Dyslipidemia 03/02/2016  . Morbid obesity ([place]) 03/01/2016  . T2DM (type 2 diabetes mellitus) ([place]) 03/01/2016  . HTN (hypertension) 11/17/2015  . OSA (obstructive sleep apnea) 12/04/2011  . RLS (restless legs syndrome) 12/04/2011    Conditions to be addressed/monitored: NIDDM, HTN, HLD, CKD, obesity- on [employer], OSA, chronic back and left shoulder pain  Care Plan : General Plan of Care (Adult) related to self management of DM, HTN, HLD, OSA obesity and chronic pain  Updates made by Barrington Ellison, RN since 04/12/2020 12:00 AM    Problem: Disease Progression     Long-Range Goal: Chronic Disease Progression of HTN, DM,  CKD, OSA, Obesity, Chronic pain Prevented or Minimized   Start Date: 02/05/2020  Recent Progress: On track  Priority: High  Note:   Current Barriers:  . Chronic Disease Management support and education needs related to HTN, NIDDM, HLD, CKD, OSA- uses CPAP, obesity, chronic back and left shoulder  pain - spoke with patient briefly  as she was on her way to exercise class ( taekwondo) , states her self monitored blood pressures are meeting target, she says the home TENS unit is really helping her pain in her back and shoulder, she was very pleased with her labs of 04/08/20 and states the clinic staff have really motivated her to sustain her healthy lifestyle changes . Unable to independently secure home TENS unit to treat chronic pain in back and left shoulder- patient left message for this [name] RN on 02/09/20 stating she received the home TENS unit from [facility] . Unable to independently get CPAP mask adjusted to increase adherence- completed  Nurse Case Manager Clinical Goal(s):  Marland Kitchen Over the next 30-60 days, patient will attend all scheduled medical appointment. . Over the next 30-60 days, the patient will demonstrate ongoing self health care management ability as evidenced by meeting treatment targets for HTN, DM, HLD, CKD, OSA, and obesity.  Interventions:  . 1:1 collaboration with [name], MD regarding development and update of comprehensive plan of care as evidenced by provider attestation and co-signature . Inter-disciplinary care team collaboration (see longitudinal plan of care) . Reviewed treatment targets and /or patient goals for managing HTN, DM and HLD, CKD, OSA and chronic pain . Evaluation of current treatment plan related to HTN, DM and HLD, CKD, OSA and chronic pain and patient's adherence to plan as established by provider. . Reviewed medications with patient and assessed medication taking behavior . Congratulated patient on her heathy lifestyle changes and ongoing successful weight loss and starting exercise class . Reviewed clinic note of 04/08/20 and lab results . Discussed plans with patient for ongoing care management follow up and provided patient with direct contact information for care management team . Reviewed scheduled/upcoming provider appointments including: none at  present  Patient Goals/Self-Care Activities Over the next 30-60  days, patient will: Patient will self administer medications as prescribed Patient will attend all scheduled provider appointments Patient will attend weekly Weight Watchers meetings Patient will attend exercise classes at lest 3 times per week Patient will call pharmacy for medication refills Patient will continue to perform ADL's independently Patient will continue to perform IADL's independently Patient will call provider office for new concerns or questions  Follow Up Plan: The care management team will reach out to the patient again over the next 30-60 days.          [name], [name], [facility] RN Care Manager [phone]

## 2020-04-12 NOTE — Patient Instructions (Signed)
Visit Information It was nice speaking with you today. Congratulations on your ongoing healthy lifestyle changes. Your labs and blood pressure are excellent.  Patient Care Plan: General Plan of Care (Adult) realted to self mangament of DM, HTN, HLD, OSA obesity and chronic pain    Problem Identified: Disease Progression     Long-Range Goal: Chronic Disease Progression of HTN, DM, CKD, OSA, Obesity, Chronic pain Prevented or Minimized   Start Date: 02/05/2020  This Visit's Progress: On track  Recent Progress: On track  Priority: High  Note:   Current Barriers:  . Chronic Disease Management support and education needs related to HTN, NIDDM, HLD, CKD, OSA- uses CPAP, obesity, chronic back and left shoulder  pain - spoke with patient briefly as she was on her way to exercise class ( taekwondo) , states her self monitored blood pressures are meeting target, she says the home TENS unit is really helping her pain in her back and shoulder, she was very pleae with her labs of 04/08/20 and states the clinic staff have really motivated her to sustain her healthy lifestyle changes . Unable to independently secure home TENS unit to treat chronic pain in back and left shoulder- patient left message for this [name] RN on 02/09/20 stating she received the home TENS unit from [facility] . Unable to independently get CPAP mask adjusted to increase adherence- completed by patient with help of pulmonologist office  Nurse Case Manager Clinical Goal(s):  Marland Kitchen Over the next 30-60 days, patient will attend all scheduled medical appointment. . Over the next 30-60 days, the patient will demonstrate ongoing self health care management ability as evidenced by meeting treatment targets for HTN, DM, HLD, CKD, OSA, and obesity.  Interventions:  . 1:1 collaboration with [name], MD regarding development and update of comprehensive plan of care as evidenced by provider attestation and  co-signature . Inter-disciplinary care team collaboration (see longitudinal plan of care) . Reviewed treatment targets and /or patient goals for managing HTN, DM and HLD, CKD, OSA and chronic pain . Evaluation of current treatment plan related to HTN, DM and HLD, CKD, OSA and chronic pain and patient's adherence to plan as established by provider. . Reviewed medications with patient and assessed medication taking behavior . Congratulated patient on her heathy lifestyle changes and ongoing successful weight loss and starting exercise class . Reviewed clinic note of 04/08/20 and lab results . Discussed plans with patient for ongoing care management follow up and provided patient with direct contact information for care management team . Reviewed scheduled/upcoming provider appointments including: none at present  Patient Goals/Self-Care Activities Over the next 30-60  days, patient will: Patient will self administer medications as prescribed Patient will attend all scheduled provider appointments Patient will attend weekly Weight Watchers meetings Patient will attend exercise class at least 3 times weekly Patient will call pharmacy for medication refills Patient will continue to perform ADL's independently Patient will continue to perform IADL's independently Patient will call provider office for new concerns or questions  Follow Up Plan: The care management team will reach out to the patient again over the next 30-60 days.          The patient verbalized understanding of instructions, educational materials, and care plan provided today and declined offer to receive copy of patient instructions, educational materials, and care plan.   The care management team will reach out to the patient again over the next 30-60 days.   [name], [name], [facility]  RN Care Manager [phone]

## 2020-04-13 NOTE — Progress Notes (Signed)
Internal Medicine Clinic Attending  [name] services provided by the care management provider and their documentation were discussed with Dr. Steen. We reviewed the pertinent findings, urgent action items addressed by the resident and non-urgent items to be addressed by the PCP.  I agree with the assessment, diagnosis, and plan of care documented in the [name] and resident's note.  Emily Mullen, MD 04/13/2020  

## 2020-04-13 NOTE — Progress Notes (Signed)
Internal Medicine Clinic Resident  I have personally reviewed this encounter including the documentation in this note and/or discussed this patient with the care management provider. I will address any urgent items identified by the care management provider and will communicate my actions to the patient's PCP. I have reviewed the patient's [name] visit with my supervising attending, Dr Mullen.  Jeffrey Kullen Tomasetti, MD 04/13/2020    

## 2020-04-20 NOTE — Progress Notes (Signed)
Internal Medicine Clinic Attending  Case discussed with Dr. [name]  At the time of the visit.  We reviewed the resident's history and exam and pertinent patient test results.  I agree with the assessment, diagnosis, and plan of care documented in the resident's note.  

## 2020-04-26 DIAGNOSIS — M549 Dorsalgia, unspecified: Secondary | ICD-10-CM | POA: Diagnosis not present

## 2020-05-05 ENCOUNTER — Telehealth: Payer: Self-pay

## 2020-05-05 NOTE — Telephone Encounter (Signed)
Received TC from [place] at [facility] regarding TENS unit.  States she needs "clinicals faxed to [phone] and to put service # [id number] on the fax".  RN asked to elaborate on what "clinicals" she needed and she states DME would know. Forwarding to [employer]. SChaplin, RN,BSN

## 2020-05-09 ENCOUNTER — Ambulatory Visit: Payer: Medicaid Other | Admitting: *Deleted

## 2020-05-09 DIAGNOSIS — E785 Hyperlipidemia, unspecified: Secondary | ICD-10-CM

## 2020-05-09 DIAGNOSIS — N182 Chronic kidney disease, stage 2 (mild): Secondary | ICD-10-CM

## 2020-05-09 DIAGNOSIS — G4733 Obstructive sleep apnea (adult) (pediatric): Secondary | ICD-10-CM

## 2020-05-09 DIAGNOSIS — I10 Essential (primary) hypertension: Secondary | ICD-10-CM

## 2020-05-09 DIAGNOSIS — E119 Type 2 diabetes mellitus without complications: Secondary | ICD-10-CM

## 2020-05-09 NOTE — Chronic Care Management (AMB) (Signed)
   05/09/2020  Kristen Frost 1955-07-20 [id number]  Retrieved paperwork faxed  from [facility] that were placed in this [name] RN's clinic mailbox. Left message for Matthew Saras at [phone] requesting return call to clarify what is needed from provider other than signature on home TENS Medicaid certificate of medical necessity form.   Plan:  Await return call.   [name], [name], [facility] RN Care Manager [phone]

## 2020-05-10 ENCOUNTER — Other Ambulatory Visit: Payer: Self-pay

## 2020-05-10 ENCOUNTER — Telehealth: Payer: Self-pay

## 2020-05-10 ENCOUNTER — Telehealth: Payer: Self-pay | Admitting: *Deleted

## 2020-05-10 ENCOUNTER — Other Ambulatory Visit: Payer: Self-pay | Admitting: Student

## 2020-05-10 ENCOUNTER — Telehealth: Payer: Medicaid Other

## 2020-05-10 DIAGNOSIS — E785 Hyperlipidemia, unspecified: Secondary | ICD-10-CM

## 2020-05-10 DIAGNOSIS — R0981 Nasal congestion: Secondary | ICD-10-CM

## 2020-05-10 MED ORDER — FLUTICASONE PROPIONATE 50 MCG/ACT NA SUSP: 1.0000 | Freq: Every day | NASAL | 0 refills | Status: DC | PRN

## 2020-05-10 MED ORDER — ATORVASTATIN CALCIUM 40 MG PO TABS: 40.0000 mg | ORAL_TABLET | Freq: Every day | ORAL | 3 refills | Status: DC

## 2020-05-10 NOTE — Telephone Encounter (Signed)
  Care Management   Outreach Note  05/10/2020 Name: Kristen Frost MRN: [id number] DOB: Aug 03, 1955  Referred by: [name], MD Reason for referral : Care Coordination (NIDDM, HTN, HLD, CKD, obesity- on [employer], OSA w/CPAP, chronic back and left shoulder pain)   An unsuccessful telephone outreach was attempted today. The patient was referred to the case management team for assistance with care management and care coordination.   Follow Up Plan: A HIPAA compliant phone message was left for the patient providing contact information and requesting a return call.  The care management team will reach out to the patient again over the next 7-14 days. days.   SIGNATURE

## 2020-05-10 NOTE — Telephone Encounter (Signed)
Pls contact pt [phone]

## 2020-05-10 NOTE — Telephone Encounter (Signed)
I refilled her atorvastatin Flonase.  Thank you

## 2020-05-10 NOTE — Telephone Encounter (Signed)
I will ask The Attending to refill meds.

## 2020-05-10 NOTE — Telephone Encounter (Signed)
RTC, pt states she needs refills on atorvastatin and flonase.  She states she has already asked for both refills by TC, nothing noted in chart. Will submit to MD. Laurence Compton, RN,BSN

## 2020-05-10 NOTE — Telephone Encounter (Signed)
Per patient the pharmacy is requesting another doctor to called  atorvastatin (LIPITOR) 40 MG tablet and   fluticasone (FLONASE) 50 MCG/ACT nasal spray due to [employer] not accepting.

## 2020-05-16 ENCOUNTER — Ambulatory Visit: Payer: Medicaid Other | Admitting: *Deleted

## 2020-05-16 DIAGNOSIS — G4733 Obstructive sleep apnea (adult) (pediatric): Secondary | ICD-10-CM

## 2020-05-16 DIAGNOSIS — I10 Essential (primary) hypertension: Secondary | ICD-10-CM

## 2020-05-16 DIAGNOSIS — E785 Hyperlipidemia, unspecified: Secondary | ICD-10-CM

## 2020-05-16 DIAGNOSIS — E119 Type 2 diabetes mellitus without complications: Secondary | ICD-10-CM

## 2020-05-16 DIAGNOSIS — N182 Chronic kidney disease, stage 2 (mild): Secondary | ICD-10-CM

## 2020-05-16 NOTE — Progress Notes (Signed)
Internal Medicine Clinic Resident  I have personally reviewed this encounter including the documentation in this note and/or discussed this patient with the care management provider. I will address any urgent items identified by the care management provider and will communicate my actions to the patient's PCP. I have reviewed the patient's [name] visit with my supervising attending, Dr Heber Bogalusa.  Mitzi Hansen, MD Internal Medicine Resident PGY-2 [facility] Pager: [phone] 05/16/2020 11:55 AM

## 2020-05-16 NOTE — Chronic Care Management (AMB) (Signed)
   05/16/2020  Annice Jolly [birth year] [id number]  Reached patient via phone with purpose of completing follow up assessment, patient states she is on the other line and requests call back tomorrow afternoon.  Scheduled call for 05/16/20 at 12:30 pm.  [name], [name], [facility] RN Care Manager [phone]

## 2020-05-17 ENCOUNTER — Other Ambulatory Visit: Payer: Self-pay | Admitting: Internal Medicine

## 2020-05-17 ENCOUNTER — Ambulatory Visit: Payer: Medicaid Other | Admitting: *Deleted

## 2020-05-17 DIAGNOSIS — E785 Hyperlipidemia, unspecified: Secondary | ICD-10-CM

## 2020-05-17 DIAGNOSIS — I10 Essential (primary) hypertension: Secondary | ICD-10-CM

## 2020-05-17 DIAGNOSIS — E119 Type 2 diabetes mellitus without complications: Secondary | ICD-10-CM

## 2020-05-17 DIAGNOSIS — G4733 Obstructive sleep apnea (adult) (pediatric): Secondary | ICD-10-CM

## 2020-05-17 DIAGNOSIS — R0981 Nasal congestion: Secondary | ICD-10-CM

## 2020-05-17 DIAGNOSIS — N182 Chronic kidney disease, stage 2 (mild): Secondary | ICD-10-CM

## 2020-05-17 MED ORDER — ATORVASTATIN CALCIUM 40 MG PO TABS: 40.0000 mg | ORAL_TABLET | Freq: Every day | ORAL | 3 refills | Status: DC

## 2020-05-17 MED ORDER — FLUTICASONE PROPIONATE 50 MCG/ACT NA SUSP
1.0000 | Freq: Every day | NASAL | 0 refills | Status: DC | PRN
Start: 2020-05-17 — End: 2020-06-20

## 2020-05-17 NOTE — Chronic Care Management (AMB) (Addendum)
 Care Management    RN Visit Note  05/17/2020 Name: Kristen Frost MRN: [id number] DOB: 05/16/1955  Subjective: Kristen Frost is a 64 y.o. year old female who is a primary care patient of [name], Vasilios, MD. The care management team was consulted for assistance with disease management and care coordination needs.    Engaged with patient by telephone for follow up visit in response to provider referral for case management and/or care coordination services.   Consent to Services:   Kristen Frost was given information about Care Management services today including:  1. Care Management services includes personalized support from designated clinical staff supervised by her physician, including individualized plan of care and coordination with other care providers 2. 24/7 contact phone numbers for assistance for urgent and routine care needs. 3. The patient may stop case management services at any time by phone call to the office staff.  Patient agreed to services and consent obtained.   Assessment: Review of patient past medical history, allergies, medications, health status, including review of consultants reports, laboratory and other test data, was performed as part of comprehensive evaluation and provision of chronic care management services.   SDOH (Social Determinants of Health) assessments and interventions performed:    Care Plan  No Known Allergies  Outpatient Encounter Medications as of 05/17/2020  Medication Sig  . VICTOZA 18 MG/3ML SOPN ADMINISTER 1.8 MG UNDER THE SKIN DAILY  . amLODipine (NORVASC) 10 MG tablet TAKE 1 TABLET BY MOUTH DAILY  . aspirin EC 81 MG tablet Take 81 mg by mouth daily.  . atorvastatin (LIPITOR) 40 MG tablet Take 1 tablet (40 mg total) by mouth daily.  . Cholecalciferol (VITAMIN D) 2000 units CAPS Take 1 capsule (2,000 Units total) by mouth daily. (Patient taking differently: Take 2,000 Units by mouth daily. )  . ferrous sulfate 325  (65 FE) MG tablet Take 1 tablet (325 mg total) by mouth daily with breakfast. (Patient taking differently: Take 325 mg by mouth daily with breakfast. )  . fluticasone (FLONASE) 50 MCG/ACT nasal spray Place 1 spray into both nostrils daily as needed.  . Insulin Pen Needle (PEN NEEDLES) 31G X 5 MM MISC 1 application by Does not apply route daily.  . metFORMIN (GLUCOPHAGE-XR) 500 MG 24 hr tablet Take 1 tablet (500 mg total) by mouth daily with breakfast.   No facility-administered encounter medications on file as of 05/17/2020.    Patient Active Problem List   Diagnosis Date Noted  . Hearing difficulty of right ear 04/08/2020  . Visit for routine gyn exam 11/09/2019  . Left shoulder pain 10/29/2019  . Acute back pain 09/22/2019  . CKD (chronic kidney disease) stage 2, GFR 60-89 ml/min 08/09/2016  . Healthcare maintenance 05/17/2016  . Vitamin D deficiency 03/09/2016  . Dyslipidemia 03/02/2016  . Morbid obesity (HCC) 03/01/2016  . T2DM (type 2 diabetes mellitus) (HCC) 03/01/2016  . HTN (hypertension) 11/17/2015  . OSA (obstructive sleep apnea) 12/04/2011  . RLS (restless legs syndrome) 12/04/2011    Conditions to be addressed/monitored: NIDDM, HTN, HLD, CKD, obesity- on WW, OSA w/CPAP, chronic back and left shoulder pain  Care Plan : General Plan of Care (Adult) related to self managment of DM, HTN, HLD, OSA obesity and chronic pain  Updates made by Hauser, Janet S, RN since 05/17/2020 12:00 AM    Problem: Disease Progression     Long-Range Goal: Chronic Disease Progression of HTN, DM, CKD, OSA, Obesity, Chronic pain Prevented or Minimized   Start   Date: 02/05/2020  Expected End Date: 08/18/2020  Recent Progress: On track  Priority: High  Note:   Current Barriers:  . Chronic Disease Management support and education needs related to HTN, NIDDM, HLD, CKD, OSA- uses CPAP, obesity, chronic back and left shoulder  pain - spoke with patient to complete follow up assessment, states she is  exercising 5 times weekly doing classes for seniors like Yoga, and Tai Chi, says she is still slowly losing weight with Weight Watchers, states her self monitored blood pressures are meeting target, she says she uses her home TENS unit intermittently to treat her chronic pain in her back and shoulder. Requesting assistance in getting her flonase and atorvastatin refilled - must be called in by approved Medicaid provider  Nurse Case Manager Clinical Goal(s):  . Over the next 30-60 days, patient will attend all scheduled medical appointment. . Over the next 30-60 days, the patient will demonstrate ongoing self health care management ability as evidenced by meeting treatment targets for HTN, DM, HLD, CKD, OSA, and obesity.  Interventions:  . 1:1 collaboration with [name], Vasilios, MD regarding development and update of comprehensive plan of care as evidenced by provider attestation and co-signature . Inter-disciplinary care team collaboration (see longitudinal plan of care) . Reviewed treatment targets and /or patient goals for managing HTN, DM and HLD, CKD, OSA and chronic pain . Evaluation of current treatment plan related to HTN, DM and HLD, CKD, OSA and chronic pain and patient's adherence to plan as established by provider. . Reviewed medications with patient and assessed medication taking behavior . Again congratulated patient on her healthy lifestyle changes and ongoing successful weight loss and exercising regular . Discussed plans with patient for ongoing care management follow up and provided patient with direct contact information for care management team . Messaged clinic provider about request for statin and Flonase refill by approved medicaid provider  . Reviewed scheduled/upcoming provider appointments including: none at present, says she will call next month to schedule 6 month follow up in clinic  Patient Goals/Self-Care Activities Over the next 30-60  days, patient  will: Patient will self administer medications as prescribed Patient will attend all scheduled provider appointments Patient will attend weekly Weight Watchers meetings Patient will attend exercise class at least 3 times weekly Patient will call pharmacy for medication refills Patient will continue to perform ADL's independently Patient will continue to perform IADL's independently Patient will call provider office for new concerns or questions  Follow Up Plan: The care management team will reach out to the patient again over the next 30-60 days.          Janet Hauser RN, [name], [name] Clinic RN Care Manager [phone]         

## 2020-05-17 NOTE — Patient Instructions (Signed)
Visit Information It was nice speaking with you today. Patient Care Plan: General Plan of Care (Adult) related to self managment of DM, HTN, HLD, OSA obesity and chronic pain    Problem Identified: Disease Progression     Long-Range Goal: Chronic Disease Progression of HTN, DM, CKD, OSA, Obesity, Chronic pain Prevented or Minimized   Start Date: 02/05/2020  Expected End Date: 08/18/2020  Recent Progress: On track  Priority: High  Note:   Current Barriers:  . Chronic Disease Management support and education needs related to HTN, NIDDM, HLD, CKD, OSA- uses CPAP, obesity, chronic back and left shoulder  pain - spoke with patient to complete follow up assessment, states she is exercising 5 times weekly doing classes for seniors like Yoga, and Tai Chi, says she is still slowly losing weight with Weight Watchers, states her self monitored blood pressures are meeting target, she says she uses her home TENS unit intermittently to treat her chronic pain in her back and shoulder. Requesting assistance in getting her atorvastatin refilled - must be called in by approved Medicaid provider  Nurse Case Manager Clinical Goal(s):  Marland Kitchen Over the next 30-60 days, patient will attend all scheduled medical appointment. . Over the next 30-60 days, the patient will demonstrate ongoing self health care management ability as evidenced by meeting treatment targets for HTN, DM, HLD, CKD, OSA, and obesity.  Interventions:  . 1:1 collaboration with [name], MD regarding development and update of comprehensive plan of care as evidenced by provider attestation and co-signature . Inter-disciplinary care team collaboration (see longitudinal plan of care) . Reviewed treatment targets and /or patient goals for managing HTN, DM and HLD, CKD, OSA and chronic pain . Evaluation of current treatment plan related to HTN, DM and HLD, CKD, OSA and chronic pain and patient's adherence to plan as established by  provider. . Reviewed medications with patient and assessed medication taking behavior . Again congratulated patient on her healthy lifestyle changes and ongoing successful weight loss and exercising regular . Messaged clinic provider about request for statin refill by approved medicaid provider  . Discussed plans with patient for ongoing care management follow up and provided patient with direct contact information for care management team . Reviewed scheduled/upcoming provider appointments including: none at present, says she will call next month to schedule 6 month follow up in clinic  Patient Goals/Self-Care Activities Over the next 30-60  days, patient will: Patient will self administer medications as prescribed Patient will attend all scheduled provider appointments Patient will attend weekly Weight Watchers meetings Patient will attend exercise class at least 3 times weekly Patient will call pharmacy for medication refills Patient will continue to perform ADL's independently Patient will continue to perform IADL's independently Patient will call provider office for new concerns or questions  Follow Up Plan: The care management team will reach out to the patient again over the next 30-60 days.          The patient verbalized understanding of instructions, educational materials, and care plan provided today and declined offer to receive copy of patient instructions, educational materials, and care plan.   The care management team will reach out to the patient again over the next 30-60 days.   [name], [name], [facility] RN Care Manager [phone]

## 2020-05-23 ENCOUNTER — Telehealth: Payer: Self-pay | Admitting: Student

## 2020-05-23 ENCOUNTER — Ambulatory Visit: Payer: Medicaid Other | Admitting: *Deleted

## 2020-05-23 DIAGNOSIS — I10 Essential (primary) hypertension: Secondary | ICD-10-CM

## 2020-05-23 DIAGNOSIS — E119 Type 2 diabetes mellitus without complications: Secondary | ICD-10-CM

## 2020-05-23 DIAGNOSIS — N182 Chronic kidney disease, stage 2 (mild): Secondary | ICD-10-CM

## 2020-05-23 DIAGNOSIS — Z0000 Encounter for general adult medical examination without abnormal findings: Secondary | ICD-10-CM

## 2020-05-23 DIAGNOSIS — M25512 Pain in left shoulder: Secondary | ICD-10-CM

## 2020-05-23 DIAGNOSIS — G4733 Obstructive sleep apnea (adult) (pediatric): Secondary | ICD-10-CM

## 2020-05-23 NOTE — Telephone Encounter (Signed)
Good Morning.  This patient has sent a request  Per My Chart for a MM Digital Screening Test for her yearly mammogram appointment.  Please advise if an order can be placed to the [facility] for this patient.

## 2020-05-23 NOTE — Telephone Encounter (Signed)
Good morning, Order placed for screening mammogram to [facility].   Thank you!

## 2020-05-23 NOTE — Chronic Care Management (AMB) (Signed)
Care Management    RN Visit Note  05/23/2020 Name: Kristen Frost MRN: [id number] DOB: 1955-06-19  Subjective: Kristen Frost is a 65 y.o. year old female who is a primary care patient of [name], [name], MD. The care management team was consulted for assistance with disease management and care coordination needs.    Consent to Services:   Kristen Frost was given information about Care Management services today including:  1. Care Management services includes personalized support from designated clinical staff supervised by her physician, including individualized plan of care and coordination with other care providers 2. 24/7 contact phone numbers for assistance for urgent and routine care needs. 3. The patient may stop case management services at any time by phone call to the office staff.  Patient agreed to services and consent obtained.   Assessment: Review of patient past medical history, allergies, medications, health status, including review of consultants reports, laboratory and other test data, was performed as part of comprehensive evaluation and provision of chronic care management services.   SDOH (Social Determinants of Health) assessments and interventions performed:    Care Plan  No Known Allergies  Outpatient Encounter Medications as of 05/23/2020  Medication Sig  . VICTOZA 18 MG/3ML SOPN ADMINISTER 1.8 MG UNDER THE SKIN DAILY  . amLODipine (NORVASC) 10 MG tablet TAKE 1 TABLET BY MOUTH DAILY  . aspirin EC 81 MG tablet Take 81 mg by mouth daily.  Marland Kitchen atorvastatin (LIPITOR) 40 MG tablet Take 1 tablet (40 mg total) by mouth daily.  . Cholecalciferol (VITAMIN D) 2000 units CAPS Take 1 capsule (2,000 Units total) by mouth daily. (Patient taking differently: Take 2,000 Units by mouth daily. )  . ferrous sulfate 325 (65 FE) MG tablet Take 1 tablet (325 mg total) by mouth daily with breakfast. (Patient taking differently: Take 325 mg by mouth daily with breakfast.  )  . fluticasone (FLONASE) 50 MCG/ACT nasal spray Place 1 spray into both nostrils daily as needed.  . Insulin Pen Needle (PEN NEEDLES) 31G X 5 MM MISC 1 application by Does not apply route daily.  . metFORMIN (GLUCOPHAGE-XR) 500 MG 24 hr tablet Take 1 tablet (500 mg total) by mouth daily with breakfast.   No facility-administered encounter medications on file as of 05/23/2020.    Patient Active Problem List   Diagnosis Date Noted  . Hearing difficulty of right ear 04/08/2020  . Visit for routine gyn exam 11/09/2019  . Left shoulder pain 10/29/2019  . Acute back pain 09/22/2019  . CKD (chronic kidney disease) stage 2, GFR 60-89 ml/min 08/09/2016  . Healthcare maintenance 05/17/2016  . Vitamin D deficiency 03/09/2016  . Dyslipidemia 03/02/2016  . Morbid obesity ([place]) 03/01/2016  . T2DM (type 2 diabetes mellitus) ([place]) 03/01/2016  . HTN (hypertension) 11/17/2015  . OSA (obstructive sleep apnea) 12/04/2011  . RLS (restless legs syndrome) 12/04/2011    Conditions to be addressed/monitored: NIDDM, HTN, HLD, CKD, obesity- on [employer], OSA w/CPAP, chronic back and left shoulder pain  Care Plan : General Plan of Care (Adult) related to self managment of DM, HTN, HLD, OSA obesity and chronic pain  Updates made by Barrington Ellison, RN since 05/23/2020 12:00 AM    Problem: Disease Progression     Long-Range Goal: Chronic Disease Progression of HTN, DM, CKD, OSA, Obesity, Chronic pain Prevented or Minimized   Start Date: 02/05/2020  Expected End Date: 08/18/2020  Recent Progress: On track  Priority: High  Note:   Current Barriers:  .  Chronic Disease Management support and education needs related to HTN, NIDDM, HLD, CKD, OSA- uses CPAP, obesity, chronic back and left shoulder  pain - spoke with patient to complete follow up assessment, states she is exercising 5 times weekly doing classes for seniors like Yoga, and Tai Chi, says she is still slowly losing weight with Weight Watchers, states her self  monitored blood pressures are meeting target, she says she uses her home TENS unit intermittently to treat her chronic pain in her back and shoulder. Requesting assistance in getting her atorvastatin refilled - must be called in by approved Medicaid provider  Nurse Case Manager Clinical Goal(s):  Marland Kitchen Over the next 30-60 days, patient will attend all scheduled medical appointment. . Over the next 30-60 days, the patient will demonstrate ongoing self health care management ability as evidenced by meeting treatment targets for HTN, DM, HLD, CKD, OSA, and obesity.  Interventions:  . 1:1 collaboration with [name], MD regarding development and update of comprehensive plan of care as evidenced by provider attestation and co-signature . Inter-disciplinary care team collaboration (see longitudinal plan of care) . Reviewed treatment targets and /or patient goals for managing HTN, DM and HLD, CKD, OSA and chronic pain . Evaluation of current treatment plan related to HTN, DM and HLD, CKD, OSA and chronic pain and patient's adherence to plan as established by provider. . Reviewed medications with patient and assessed medication taking behavior . Again congratulated patient on her healthy lifestyle changes and ongoing successful weight loss and exercising regular . Messaged clinic provider about request for statin refill by approved medicaid provider  . Discussed plans with patient for ongoing care management follow up and provided patient with direct contact information for care management team . Reviewed scheduled/upcoming provider appointments including: none at present, says she will call next month to schedule 6 month follow up in clinic . 05/23/20- [place] Medicaid Request for Prior Approval form for renewal of home TENS unit therapy placed in Yellow Team's box for Dr. Ala Bent' signature when he returns from vacation on 05/30/20  Patient Goals/Self-Care Activities Over the next 30-60  days,  patient will: Patient will self administer medications as prescribed Patient will attend all scheduled provider appointments Patient will attend weekly Weight Watchers meetings Patient will attend exercise class at least 3 times weekly Patient will call pharmacy for medication refills Patient will continue to perform ADL's independently Patient will continue to perform IADL's independently Patient will call provider office for new concerns or questions  Follow Up Plan: The care management team will reach out to the patient again over the next 30-60 days.          [name], [name], [facility] RN Care Manager [phone]

## 2020-06-10 ENCOUNTER — Ambulatory Visit: Payer: Medicaid Other | Admitting: *Deleted

## 2020-06-10 ENCOUNTER — Other Ambulatory Visit: Payer: Self-pay

## 2020-06-10 DIAGNOSIS — G4733 Obstructive sleep apnea (adult) (pediatric): Secondary | ICD-10-CM

## 2020-06-10 DIAGNOSIS — E119 Type 2 diabetes mellitus without complications: Secondary | ICD-10-CM

## 2020-06-10 DIAGNOSIS — I10 Essential (primary) hypertension: Secondary | ICD-10-CM

## 2020-06-10 DIAGNOSIS — M25512 Pain in left shoulder: Secondary | ICD-10-CM

## 2020-06-10 DIAGNOSIS — N182 Chronic kidney disease, stage 2 (mild): Secondary | ICD-10-CM

## 2020-06-10 MED ORDER — VICTOZA 18 MG/3ML ~~LOC~~ SOPN: PEN_INJECTOR | SUBCUTANEOUS | 2 refills | Status: DC

## 2020-06-10 NOTE — Chronic Care Management (AMB) (Signed)
Care Management    RN Visit Note  06/10/2020 Name: Kristen Frost MRN: [id number] DOB: [birth year]  Subjective: Kristen Frost is a 65 y.o. year old female who is a primary care patient of [name], [name], MD. The care management team was consulted for assistance with disease management and care coordination needs.    Consent to Services:   Ms. Nolden was given information about Care Management services today including:  1. Care Management services includes personalized support from designated clinical staff supervised by her physician, including individualized plan of care and coordination with other care providers 2. 24/7 contact phone numbers for assistance for urgent and routine care needs. 3. The patient may stop case management services at any time by phone call to the office staff.  Patient agreed to services and consent obtained.   Assessment: Review of patient past medical history, allergies, medications, health status, including review of consultants reports, laboratory and other test data, was performed as part of comprehensive evaluation and provision of chronic care management services.   SDOH (Social Determinants of Health) assessments and interventions performed:    Care Plan  No Known Allergies  Outpatient Encounter Medications as of 06/10/2020  Medication Sig  . amLODipine (NORVASC) 10 MG tablet TAKE 1 TABLET BY MOUTH DAILY  . aspirin EC 81 MG tablet Take 81 mg by mouth daily.  Marland Kitchen atorvastatin (LIPITOR) 40 MG tablet Take 1 tablet (40 mg total) by mouth daily.  . Cholecalciferol (VITAMIN D) 2000 units CAPS Take 1 capsule (2,000 Units total) by mouth daily. (Patient taking differently: Take 2,000 Units by mouth daily. )  . ferrous sulfate 325 (65 FE) MG tablet Take 1 tablet (325 mg total) by mouth daily with breakfast. (Patient taking differently: Take 325 mg by mouth daily with breakfast. )  . fluticasone (FLONASE) 50 MCG/ACT nasal spray Place 1 spray  into both nostrils daily as needed.  . Insulin Pen Needle (PEN NEEDLES) 31G X 5 MM MISC 1 application by Does not apply route daily.  Marland Kitchen liraglutide (VICTOZA) 18 MG/3ML SOPN ADMINISTER 1.8 MG UNDER THE SKIN DAILY  . metFORMIN (GLUCOPHAGE-XR) 500 MG 24 hr tablet Take 1 tablet (500 mg total) by mouth daily with breakfast.   No facility-administered encounter medications on file as of 06/10/2020.    Patient Active Problem List   Diagnosis Date Noted  . Hearing difficulty of right ear 04/08/2020  . Visit for routine gyn exam 11/09/2019  . Left shoulder pain 10/29/2019  . Acute back pain 09/22/2019  . CKD (chronic kidney disease) stage 2, GFR 60-89 ml/min 08/09/2016  . Healthcare maintenance 05/17/2016  . Vitamin D deficiency 03/09/2016  . Dyslipidemia 03/02/2016  . Morbid obesity ([place]) 03/01/2016  . T2DM (type 2 diabetes mellitus) ([place]) 03/01/2016  . HTN (hypertension) 11/17/2015  . OSA (obstructive sleep apnea) 12/04/2011  . RLS (restless legs syndrome) 12/04/2011    Conditions to be addressed/monitored: NIDDM, HTN, HLD, CKD, obesity- on [employer], OSA w/CPAP, chronic back and left shoulder pain  Care Plan : General Plan of Care (Adult) related to self managment of DM, HTN, HLD, OSA obesity and chronic pain  Updates made by Barrington Ellison, RN since 06/10/2020 12:00 AM    Problem: Disease Progression     Long-Range Goal: Chronic Disease Progression of HTN, DM, CKD, OSA, Obesity, Chronic pain Prevented or Minimized   Start Date: 02/05/2020  Expected End Date: 08/18/2020  Recent Progress: On track  Priority: High  Note:   Current Barriers:  .  Chronic Disease Management support and education needs related to HTN, NIDDM, HLD, CKD, OSA- uses CPAP, obesity, chronic back and left shoulder  pain - spoke with patient to complete follow up assessment, states she is exercising 5 times weekly doing classes for seniors like Yoga, and Tai Chi, says she is still slowly losing weight with Weight [employer]chers,  states her self monitored blood pressures are meeting target, she says she uses her home TENS unit intermittently to treat her chronic pain in her back and shoulder. Requesting assistance in getting her atorvastatin refilled - must be called in by approved Medicaid provider  Nurse Case Manager Clinical Goal(s):  Marland Kitchen Over the next 30-60 days, patient will attend all scheduled medical appointment. . Over the next 30-60 days, the patient will demonstrate ongoing self health care management ability as evidenced by meeting treatment targets for HTN, DM, HLD, CKD, OSA, and obesity.  Interventions:  . 1:1 collaboration with [name], MD regarding development and update of comprehensive plan of care as evidenced by provider attestation and co-signature . Inter-disciplinary care team collaboration (see longitudinal plan of care) . Reviewed treatment targets and /or patient goals for managing HTN, DM and HLD, CKD, OSA and chronic pain . Evaluation of current treatment plan related to HTN, DM and HLD, CKD, OSA and chronic pain and patient's adherence to plan as established by provider. . Reviewed medications with patient and assessed medication taking behavior . Again congratulated patient on her healthy lifestyle changes and ongoing successful weight loss and exercising regular . Messaged clinic provider about request for statin refill by approved medicaid provider  . Discussed plans with patient for ongoing care management follow up and provided patient with direct contact information for care management team . Reviewed scheduled/upcoming provider appointments including: none at present, says she will call next month to schedule 6 month follow up in clinic . 05/23/20- [place] Medicaid Request for Prior Approval form for renewal of home TENS unit therapy placed in Yellow Team's box for Dr. Ala Bent' signature when he returns from vacation on 05/30/20 . 06/07/20- Retrieved signed Prior Approval form for  renewal of home TENS therapy from this [name] RN's clinic mailbox . 06/08/20- Signed Prior Approval form for renewal of home TENS therapy successfully faxed to: [employer] Attn: Henreitta Cea fax [phone]  Patient Goals/Self-Care Activities Over the next 30-60  days, patient will: Patient will self administer medications as prescribed Patient will attend all scheduled provider appointments Patient will attend weekly Weight Watchers meetings Patient will attend exercise class at least 3 times weekly Patient will call pharmacy for medication refills Patient will continue to perform ADL's independently Patient will continue to perform IADL's independently Patient will call provider office for new concerns or questions  Follow Up Plan: The care management team will reach out to the patient again over the next 30-60 days.           [name], [name], [facility] RN Care Manager [phone]

## 2020-06-13 ENCOUNTER — Telehealth: Payer: Medicaid Other

## 2020-06-18 ENCOUNTER — Other Ambulatory Visit: Payer: Self-pay | Admitting: Internal Medicine

## 2020-06-18 DIAGNOSIS — R0981 Nasal congestion: Secondary | ICD-10-CM

## 2020-06-27 ENCOUNTER — Telehealth: Payer: Medicaid Other

## 2020-07-11 ENCOUNTER — Telehealth: Payer: Medicaid Other

## 2020-07-19 ENCOUNTER — Encounter: Payer: Self-pay | Admitting: Student

## 2020-07-19 ENCOUNTER — Ambulatory Visit: Payer: Medicaid Other | Admitting: *Deleted

## 2020-07-19 ENCOUNTER — Ambulatory Visit (INDEPENDENT_AMBULATORY_CARE_PROVIDER_SITE_OTHER): Payer: Medicaid Other | Admitting: Student

## 2020-07-19 VITALS — BP 127/61 | Temp 98.1°F | Ht 61.5 in | Wt 223.8 lb

## 2020-07-19 DIAGNOSIS — I10 Essential (primary) hypertension: Secondary | ICD-10-CM

## 2020-07-19 DIAGNOSIS — E785 Hyperlipidemia, unspecified: Secondary | ICD-10-CM

## 2020-07-19 DIAGNOSIS — E119 Type 2 diabetes mellitus without complications: Secondary | ICD-10-CM | POA: Diagnosis not present

## 2020-07-19 DIAGNOSIS — J302 Other seasonal allergic rhinitis: Secondary | ICD-10-CM | POA: Diagnosis not present

## 2020-07-19 DIAGNOSIS — M25512 Pain in left shoulder: Secondary | ICD-10-CM

## 2020-07-19 DIAGNOSIS — M7918 Myalgia, other site: Secondary | ICD-10-CM | POA: Insufficient documentation

## 2020-07-19 DIAGNOSIS — N182 Chronic kidney disease, stage 2 (mild): Secondary | ICD-10-CM

## 2020-07-19 DIAGNOSIS — G4733 Obstructive sleep apnea (adult) (pediatric): Secondary | ICD-10-CM

## 2020-07-19 DIAGNOSIS — Z0000 Encounter for general adult medical examination without abnormal findings: Secondary | ICD-10-CM

## 2020-07-19 LAB — POCT GLYCOSYLATED HEMOGLOBIN (HGB A1C): Hemoglobin A1C: 5.8 % — AB (ref 4.0–5.6)

## 2020-07-19 LAB — GLUCOSE, CAPILLARY: Glucose-Capillary: 80 mg/dL (ref 70–99)

## 2020-07-19 MED ORDER — CETIRIZINE HCL 10 MG PO TABS: 10.0000 mg | ORAL_TABLET | Freq: Every day | ORAL | 3 refills | Status: DC

## 2020-07-19 NOTE — Assessment & Plan Note (Signed)
Assessment: Patient scheduled for mammogram 07/22. Still declines wanting to discuss vaccinations at this time.   Plan: -Mammo 7/22

## 2020-07-19 NOTE — Assessment & Plan Note (Signed)
Assessment: Patient continues to exercise daily and is working on eating a healthier diet. She has lost 8 lbs in the past 7 months, I congratulated her on this and encouraged her to keep up the hard work.   Plan: -Continue to exercise regularly and work to eat a healthy diet.

## 2020-07-19 NOTE — Assessment & Plan Note (Signed)
BP Readings from Last 3 Encounters:  07/19/20 127/61  04/08/20 129/78  01/06/20 107/78   Assessment: BP well controlled on amlodipine 10 mg daily. Patient without lightheadedness/dizziness. Denies lower extremity swelling.   Plan: -Continue amlodipine 10 mg daily

## 2020-07-19 NOTE — Chronic Care Management (AMB) (Signed)
Care Management    RN Visit Note  07/19/2020 Name: Kristen Frost MRN: [id number] DOB: 1956-01-17  Subjective: Kristen Frost is a 65 y.o. year old female who is a primary care patient of [name], [name], MD. The care management team was consulted for assistance with disease management and care coordination needs.    Engaged with patient face to face for follow up visit in response to provider referral for case management and/or care coordination services.   Consent to Services:   Ms. Roehrs was given information about Care Management services today including:  1. Care Management services includes personalized support from designated clinical staff supervised by her physician, including individualized plan of care and coordination with other care providers 2. 24/7 contact phone numbers for assistance for urgent and routine care needs. 3. The patient may stop case management services at any time by phone call to the office staff.  Patient agreed to services and consent obtained.   Assessment: Review of patient past medical history, allergies, medications, health status, including review of consultants reports, laboratory and other test data, was performed as part of comprehensive evaluation and provision of chronic care management services.   SDOH (Social Determinants of Health) assessments and interventions performed:    Care Plan  No Known Allergies  Outpatient Encounter Medications as of 07/19/2020  Medication Sig  . amLODipine (NORVASC) 10 MG tablet TAKE 1 TABLET BY MOUTH DAILY  . aspirin EC 81 MG tablet Take 81 mg by mouth daily.  Marland Kitchen atorvastatin (LIPITOR) 40 MG tablet Take 1 tablet (40 mg total) by mouth daily.  . Cholecalciferol (VITAMIN D) 2000 units CAPS Take 1 capsule (2,000 Units total) by mouth daily. (Patient taking differently: Take 2,000 Units by mouth daily. )  . ferrous sulfate 325 (65 FE) MG tablet Take 1 tablet (325 mg total) by mouth daily with  breakfast. (Patient taking differently: Take 325 mg by mouth daily with breakfast. )  . fluticasone (FLONASE) 50 MCG/ACT nasal spray SHAKE LIQUID AND USE 1 SPRAY IN EACH NOSTRIL DAILY AS NEEDED  . Insulin Pen Needle (PEN NEEDLES) 31G X 5 MM MISC 1 application by Does not apply route daily.  Marland Kitchen liraglutide (VICTOZA) 18 MG/3ML SOPN ADMINISTER 1.8 MG UNDER THE SKIN DAILY  . metFORMIN (GLUCOPHAGE-XR) 500 MG 24 hr tablet Take 1 tablet (500 mg total) by mouth daily with breakfast.   No facility-administered encounter medications on file as of 07/19/2020.    Patient Active Problem List   Diagnosis Date Noted  . Hearing difficulty of right ear 04/08/2020  . Visit for routine gyn exam 11/09/2019  . Left shoulder pain 10/29/2019  . Acute back pain 09/22/2019  . CKD (chronic kidney disease) stage 2, GFR 60-89 ml/min 08/09/2016  . Healthcare maintenance 05/17/2016  . Vitamin D deficiency 03/09/2016  . Dyslipidemia 03/02/2016  . Morbid obesity ([place]) 03/01/2016  . T2DM (type 2 diabetes mellitus) ([place]) 03/01/2016  . HTN (hypertension) 11/17/2015  . OSA (obstructive sleep apnea) 12/04/2011  . RLS (restless legs syndrome) 12/04/2011    Conditions to be addressed/monitored: NIDDM, HTN, HLD, CKD, obesity- on [employer], OSA w/CPAP, chronic back and left shoulder pain  Care Plan : General Plan of Care (Adult) related to self managment of DM, HTN, HLD, OSA obesity and chronic pain  Updates made by Barrington Ellison, RN since 07/19/2020 12:00 AM    Problem: Disease Progression     Long-Range Goal: Chronic Disease Progression of HTN, DM, CKD, OSA, Obesity, Chronic pain Prevented  or Minimized   Start Date: 02/05/2020  Expected End Date: 08/18/2020  Recent Progress: On track  Priority: High  Note:   Current Barriers:  . Chronic Disease Management support and education needs related to HTN, NIDDM, HLD, CKD, OSA- uses CPAP, obesity, chronic back and left shoulder  pain - spoke with patient during her clinic visit on  07/19/20 to complete follow up assessment, states she is exercising 5 times weekly doing classes for seniors like Yoga, and Tai Chi, says she is still slowly losing weight with Weight Watchers, states her self monitored blood pressures are meeting target, she says she uses her home TENS unit intermittently to treat her chronic pain in her back and shoulder. Reports good medication taking behavior. Says she is having some buttock pain but it is not related to injury from exercise, says she will get a new CPAP machine on 07/26/20  Nurse Case Manager Clinical Goal(s):  Marland Kitchen Over the next 30-60 days, patient will attend all scheduled medical appointment. . Over the next 30-60 days, the patient will demonstrate ongoing self health care management ability as evidenced by meeting treatment targets for HTN, DM, HLD, CKD, OSA, and obesity.  Interventions:  . 1:1 collaboration with [name], MD regarding development and update of comprehensive plan of care as evidenced by provider attestation and co-signature . Inter-disciplinary care team collaboration (see longitudinal plan of care) . Reviewed treatment targets and /or patient goals for managing HTN, DM and HLD, CKD, OSA and chronic pain . Evaluation of current treatment plan related to HTN, DM and HLD, CKD, OSA and chronic pain and patient's adherence to plan as established by provider. . Reviewed medications with patient and assessed medication taking behavior . Again congratulated patient on her healthy lifestyle changes and ongoing successful weight loss and regular exercise . Discussed plans with patient for ongoing care management follow up and provided patient with direct contact information for care management team . Reviewed scheduled/upcoming provider appointments including: 09/09/20 routine mammogram  Patient Goals/Self-Care Activities Over the next 30-60  days, patient will: Patient will self administer medications as prescribed Patient  will attend all scheduled provider appointments Patient will attend weekly Weight Watchers meetings Patient will attend exercise class at least 3 times weekly Patient will call pharmacy for medication refills Patient will continue to perform ADL's independently Patient will continue to perform IADL's independently Patient will call provider office for new concerns or questions  Follow Up Plan: The care management team will reach out to the patient again over the next 30-60 days.          [name], [name], [facility] RN Care Manager [phone]

## 2020-07-19 NOTE — Progress Notes (Signed)
CC: Hypertension, diabetes, allergies, left buttock pain  HPI:  Kristen Frost is a 65 y.o. female with a past medical history stated below and presents today for follow-up on management of her hypertension, diabetes, allergies, as well as wanting to discuss her left buttock pain. Please see problem based assessment and plan for additional details.  Past Medical History:  Diagnosis Date  . Anxiety   . Arthritis   . Bleeding hemorrhoids    grade 2  . Diverticulosis of colon   . Full dentures   . History of colon polyps   . Hyperlipidemia   . Hypertension    followed by pcp   (04-30-2019  per pt had stress test approx. 2005, was told normal (not available in epic)  . IDA (iron deficiency anemia)   . OSA on CPAP    followed by dr Halford Chessman ([facility])    (04-30-2019  pt  stated uses every night)  . Restless leg syndrome   . Type 2 diabetes mellitus ([place])    followed by pcp   (04-30-2019  pt stated does not check blood sugar at home)    Current Outpatient Medications on File Prior to Visit  Medication Sig Dispense Refill  . amLODipine (NORVASC) 10 MG tablet TAKE 1 TABLET BY MOUTH DAILY 90 tablet 3  . aspirin EC 81 MG tablet Take 81 mg by mouth daily.    Marland Kitchen atorvastatin (LIPITOR) 40 MG tablet Take 1 tablet (40 mg total) by mouth daily. 90 tablet 3  . Cholecalciferol (VITAMIN D) 2000 units CAPS Take 1 capsule (2,000 Units total) by mouth daily. (Patient taking differently: Take 2,000 Units by mouth daily. ) 90 capsule 1  . ferrous sulfate 325 (65 FE) MG tablet Take 1 tablet (325 mg total) by mouth daily with breakfast. (Patient taking differently: Take 325 mg by mouth daily with breakfast. ) 90 tablet 1  . fluticasone (FLONASE) 50 MCG/ACT nasal spray SHAKE LIQUID AND USE 1 SPRAY IN EACH NOSTRIL DAILY AS NEEDED 16 g 0  . Insulin Pen Needle (PEN NEEDLES) 31G X 5 MM MISC 1 application by Does not apply route daily. 90 each 1  . liraglutide (VICTOZA) 18 MG/3ML SOPN ADMINISTER 1.8  MG UNDER THE SKIN DAILY 15 mL 2  . metFORMIN (GLUCOPHAGE-XR) 500 MG 24 hr tablet Take 1 tablet (500 mg total) by mouth daily with breakfast. 180 tablet 1   No current facility-administered medications on file prior to visit.    Family History  Problem Relation Age of Onset  . Allergies Father   . Alcohol abuse Father   . Tuberculosis Father        deceased from TB  . Allergies Other        sibling  . Asthma Other        sibling  . Hypertension Other        sibling  . Migraines Other        sibling  . Rashes / Skin problems Other        sibling  . Gout Other        sibling    Social History   Socioeconomic History  . Marital status: Married    Spouse name: Not on file  . Number of children: Not on file  . Years of education: Not on file  . Highest education level: Not on file  Occupational History  . Occupation: unemployeed  Tobacco Use  . Smoking status: Never Smoker  . Smokeless  tobacco: Never Used  Vaping Use  . Vaping Use: Never used  Substance and Sexual Activity  . Alcohol use: No  . Drug use: Never  . Sexual activity: Not on file  Other Topics Concern  . Not on file  Social History Narrative  . Not on file   Social Determinants of Health   Financial Resource Strain: Low Risk   . Difficulty of Paying Living Expenses: Not very hard  Food Insecurity: No Food Insecurity  . Worried About Charity fundraiser in the Last Year: Never true  . Ran Out of Food in the Last Year: Never true  Transportation Needs: No Transportation Needs  . Lack of Transportation (Medical): No  . Lack of Transportation (Non-Medical): No  Physical Activity: Inactive  . Days of Exercise per Week: 0 days  . Minutes of Exercise per Session: 0 min  Stress: No Stress Concern Present  . Feeling of Stress : Only a little  Social Connections: Moderately Integrated  . Frequency of Communication with Friends and Family: More than three times a week  . Frequency of Social Gatherings  with Friends and Family: More than three times a week  . Attends Religious Services: More than 4 times per year  . Active Member of Clubs or Organizations: No  . Attends Archivist Meetings: Never  . Marital Status: Married  Human resources officer Violence: Not At Risk  . Fear of Current or Ex-Partner: No  . Emotionally Abused: No  . Physically Abused: No  . Sexually Abused: No    Review of Systems: ROS negative except for what is noted on the assessment and plan.  Vitals:   07/19/20 0906  BP: 127/61  Temp: 98.1 F (36.7 C)  TempSrc: Oral  SpO2: 100%  Weight: 223 lb 12.8 oz (101.5 kg)  Height: 5' 1.5" (1.562 m)     Physical Exam: Constitutional: well-appearing, sitting comfortably, no acute distress HENT: normocephalic atraumatic Eyes: conjunctiva non-erythematous Neck: supple Cardiovascular: regular rate and rhythm, no m/r/g Pulmonary/Chest: normal work of breathing on room air MSK: normal bulk and tone.  Patient tender to palpate over left deep gluteal region, consistent with piriformis muscle.  Patient without abnormal gait. Neurological: alert & oriented x 3 Skin: warm and dry Psych: Normal mood and thought process  Female chaperone present during patient's physical exam  Assessment & Plan:   See Encounters Tab for problem based charting.  Patient discussed with Dr. Laurena Slimmer, D.O. [facility], PGY-1 Pager: [phone], Phone: [phone] Date 07/19/2020 Time 2:37 PM

## 2020-07-19 NOTE — Assessment & Plan Note (Signed)
Lab Results  Component Value Date   HGBA1C 5.8 (A) 07/19/2020   Assessment: A1c of 5.8 from 5.6. At patient's last visit her metformin was decreased from 1000 mg to 500 mg. Patient continues to exercise daily and eating a healthy diet. We will continue on her current regimen and recheck A1c in 6 months.   Plan: -Continue 500 mg metformin XR and victoza 1.8 Qd -recheck a1c in 6 months

## 2020-07-19 NOTE — Assessment & Plan Note (Signed)
Assessment: Patient with a PMHx of seasonal allergies. She has been prescribed flonase in the past that was helped, however, she notes trying OTC zyrtec that helped improve her symptoms further. Will write prescription zyrtec 10 mg daily  Plan: -continue flonase and start zyrtec 10 mg daily.

## 2020-07-19 NOTE — Assessment & Plan Note (Signed)
Assessment: Patient notes for the past few years she has had period pain in her left buttock region. She is able to pinpoint the pain to the upper central portion of the buttock, she is unable to describe the pain. It does not radiate. It improves with rest. She is uncertain of what movements cause the pain to occur. The pain resolves in 3 days or so.   On exam, the patient states the pain is reproduced with deep palpation of the upper middle aspect of the left buttock, consistent with what I believe is the piriformis muscle. Recommended the patient avoid exacerbating movements and apply ice/heat as needed as well as bengay.   Plan: -Continue to monitor -avoid inciting movements

## 2020-07-19 NOTE — Patient Instructions (Signed)
Visit Information It was nice meeting with you today. Goals Addressed            This Visit's Progress   . Make and Keep All Appointments       Timeframe:  Long-Range Goal Priority:  High Start Date:   02/05/20                          Expected End Date:     ongoing                  Follow Up Date 08/18/20   - call to cancel if needed - keep a calendar with appointment dates    Why is this important?    Part of staying healthy is seeing the doctor for follow-up care.   If you forget your appointments, there are some things you can do to stay on track.    Notes: 07/19/20- meeting goal    . [place] My Quality of Life       Timeframe:  Long-Range Goal Priority:  High Start Date:           02/05/20                  Expected End Date:     ongoing                  Follow Up Date 08/18/20   - do one enjoyable thing every day    Why is this important?    Having a long-term illness can be scary.   It can also be stressful for you and your caregiver.   These steps may help.    Notes: 07/19/20- continues to attend exercise class and continues to lose weight       The patient verbalized understanding of instructions, educational materials, and care plan provided today and declined offer to receive copy of patient instructions, educational materials, and care plan.   The care management team will reach out to the patient again over the next 30-60 days.   [name], [name], [facility] RN Care Manager [phone]

## 2020-07-19 NOTE — Patient Instructions (Addendum)
Thank you, Ms.Haskell Riling for allowing [place] to provide your care today. Today we discussed   Blood pressure Your blood pressure looks excellent today please continue your medications of amlodipine.  If you find her self feeling lightheaded or dizzy please check your blood pressure to see if it is low.  Diabetes Your diabetes is well controlled, this is a combination of your diet and exercise.  You are doing fantastic overall.  Please continue to take your metformin and Victoza.  Left buttock pain I believe that this is muscular in nature.  If you find yourself having persistent pain please call the clinic to be seen.  Please continue conservative management with ibuprofen, warm compresses.  Allergies Will be sending a prescription for Zyrtec, please continue with your Flonase.  I have ordered the following labs for you:  Lab Orders  No laboratory test(s) ordered today     Tests ordered today:  None  Referrals ordered today:   Referral Orders  No referral(s) requested today     I have ordered the following medication/changed the following medications:   Stop the following medications: There are no discontinued medications.   Start the following medications: No orders of the defined types were placed in this encounter.    Follow up: 6 months for follow up blood pressure and diabetes    Remember: Keep up the great work!  Should you have any questions or concerns please call the internal medicine clinic at [phone].     Sanjuana Letters, D.O. [place]

## 2020-07-20 NOTE — Progress Notes (Signed)
Internal Medicine Clinic Attending  Case discussed with Dr. [name]  At the time of the visit.  We reviewed the resident's history and exam and pertinent patient test results.  I agree with the assessment, diagnosis, and plan of care documented in the resident's note.  

## 2020-07-23 ENCOUNTER — Other Ambulatory Visit: Payer: Self-pay | Admitting: Student

## 2020-07-23 DIAGNOSIS — R0981 Nasal congestion: Secondary | ICD-10-CM

## 2020-07-26 DIAGNOSIS — G4733 Obstructive sleep apnea (adult) (pediatric): Secondary | ICD-10-CM | POA: Diagnosis not present

## 2020-08-16 ENCOUNTER — Telehealth: Payer: Medicaid Other

## 2020-08-19 ENCOUNTER — Telehealth: Payer: Self-pay | Admitting: Pulmonary Disease

## 2020-08-19 NOTE — Telephone Encounter (Signed)
ATC Patient.  LM to call back. 

## 2020-08-23 ENCOUNTER — Encounter: Payer: Self-pay | Admitting: *Deleted

## 2020-08-23 ENCOUNTER — Ambulatory Visit: Payer: Medicaid Other | Admitting: *Deleted

## 2020-08-23 DIAGNOSIS — E119 Type 2 diabetes mellitus without complications: Secondary | ICD-10-CM

## 2020-08-23 DIAGNOSIS — N182 Chronic kidney disease, stage 2 (mild): Secondary | ICD-10-CM

## 2020-08-23 DIAGNOSIS — I10 Essential (primary) hypertension: Secondary | ICD-10-CM

## 2020-08-23 DIAGNOSIS — M25512 Pain in left shoulder: Secondary | ICD-10-CM

## 2020-08-23 DIAGNOSIS — G4733 Obstructive sleep apnea (adult) (pediatric): Secondary | ICD-10-CM

## 2020-08-23 NOTE — Chronic Care Management (AMB) (Signed)
Care Management    RN Visit Note  08/23/2020 Name: Kristen Frost MRN: [id number] DOB: 03/05/1955  Subjective: Kristen Frost is a 65 y.o. year old female who is a primary care patient of [name], [name], MD. The care management team was consulted for assistance with disease management and care coordination needs.    Engaged with patient by telephone for follow up visit in response to provider referral for case management and/or care coordination services.   Consent to Services:   Ms. Guion was given information about Care Management services today including:  Care Management services includes personalized support from designated clinical staff supervised by her physician, including individualized plan of care and coordination with other care providers 24/7 contact phone numbers for assistance for urgent and routine care needs. The patient may stop case management services at any time by phone call to the office staff.  Patient agreed to services and consent obtained.   Assessment: Review of patient past medical history, allergies, medications, health status, including review of consultants reports, laboratory and other test data, was performed as part of comprehensive evaluation and provision of chronic care management services.   SDOH (Social Determinants of Health) assessments and interventions performed:    Care Plan  No Known Allergies  Outpatient Encounter Medications as of 08/23/2020  Medication Sig   amLODipine (NORVASC) 10 MG tablet TAKE 1 TABLET BY MOUTH DAILY   aspirin EC 81 MG tablet Take 81 mg by mouth daily.   atorvastatin (LIPITOR) 40 MG tablet Take 1 tablet (40 mg total) by mouth daily.   cetirizine (ZYRTEC ALLERGY) 10 MG tablet Take 1 tablet (10 mg total) by mouth daily.   Cholecalciferol (VITAMIN D) 2000 units CAPS Take 1 capsule (2,000 Units total) by mouth daily. (Patient taking differently: Take 2,000 Units by mouth daily. )   ferrous sulfate  325 (65 FE) MG tablet Take 1 tablet (325 mg total) by mouth daily with breakfast. (Patient taking differently: Take 325 mg by mouth daily with breakfast. )   fluticasone (FLONASE) 50 MCG/ACT nasal spray SHAKE LIQUID AND USE 1 SPRAY IN EACH NOSTRIL DAILY AS NEEDED   Insulin Pen Needle (PEN NEEDLES) 31G X 5 MM MISC 1 application by Does not apply route daily.   liraglutide (VICTOZA) 18 MG/3ML SOPN ADMINISTER 1.8 MG UNDER THE SKIN DAILY   metFORMIN (GLUCOPHAGE-XR) 500 MG 24 hr tablet Take 1 tablet (500 mg total) by mouth daily with breakfast.   No facility-administered encounter medications on file as of 08/23/2020.    Patient Active Problem List   Diagnosis Date Noted   Piriformis muscle pain 07/19/2020   Seasonal allergies 07/19/2020   Hearing difficulty of right ear 04/08/2020   Visit for routine gyn exam 11/09/2019   Left shoulder pain 10/29/2019   Acute back pain 09/22/2019   CKD (chronic kidney disease) stage 2, GFR 60-89 ml/min 08/09/2016   Healthcare maintenance 05/17/2016   Vitamin D deficiency 03/09/2016   Dyslipidemia 03/02/2016   Morbid obesity ([place]) 03/01/2016   T2DM (type 2 diabetes mellitus) ([place]) 03/01/2016   HTN (hypertension) 11/17/2015   OSA (obstructive sleep apnea) 12/04/2011   RLS (restless legs syndrome) 12/04/2011    Conditions to be addressed/monitored: NIDDM, HTN, HLD, CKD, obesity- on [employer], OSA w/CPAP, chronic back and left shoulder pain  Care Plan : [name] RN- General Plan of Care (Adult) related to self managment of DM, HTN, HLD, OSA obesity and chronic pain  Updates made by Barrington Ellison, RN since 08/23/2020  12:00 AM     Problem: Disease Progression   Priority: High  Onset Date: 02/05/2020  Note:   Current Barriers: Chronic Disease Management support and education needs related to HTN, NIDDM, HLD, CKD, OSA- uses CPAP, obesity, chronic back and left shoulder  pain - spoke with patient via phone to complete follow up assessment, states she is continues to  participate in senior exercise program as she is able, says she is still slowly losing weight with Weight Watchers, states her self monitored blood pressures are meeting target, she says she uses her home TENS unit intermittently to treat her chronic pain in her back and shoulder. Reports good medication taking behavior. Says she is having some buttock pain but it is not related to injury from exercise, says she received a new CPAP machine on 07/26/20, requesting assistance with completion of form from property manager to prioritize getting her air conditioning repaired    Nurse Case Manager Clinical Goal(s):  Over the next 30-60 days, patient will attend all scheduled medical appointment. Over the next 30-60 days, the patient will demonstrate ongoing self health care management ability as evidenced by meeting treatment targets for HTN, DM, HLD, CKD, OSA, and obesity.   Interventions: 1:1 collaboration with [name], MD regarding development and update of comprehensive plan of care as evidenced by provider attestation and co-signature Inter-disciplinary care team collaboration (see longitudinal plan of care) Reviewed treatment targets and /or patient goals for managing HTN, DM and HLD, CKD, OSA and chronic pain Evaluation of current treatment plan related to HTN, DM and HLD, CKD, OSA and chronic pain and patient's adherence to plan as established by provider. Reviewed medications with patient and assessed medication taking behavior Again congratulated patient on her healthy lifestyle changes and ongoing successful weight loss and regular exercise Discussed plans with patient for ongoing care management follow up and informed patient that role of clinic RNCM will transition from this RNCM to [place] in mid July Reviewed scheduled/upcoming provider appointments including: 09/09/20 routine mammogram   Patient Goals/Self-Care Activities Over the next 30-60  days, patient  will: Patient will self administer medications as prescribed Patient will attend all scheduled provider appointments Patient will attend weekly Weight Watchers meetings Patient will attend exercise class at least 3 times weekly Patient will call pharmacy for medication refills Patient will continue to perform ADL's independently Patient will continue to perform IADL's independently Patient will call provider office for new concerns or questions   Follow Up Plan: The care management team will reach out to the patient again over the next 30-60 days.        Plan: The care management team will reach out to the patient again over the next 30 days.  [name], [name], [facility] RN Care Manager [phone]

## 2020-08-23 NOTE — Telephone Encounter (Signed)
Spoke to patient, who is requesting a letter for her housing development stating that she has a cpap and she needs AC.  A letter is needed in order for maintenance to come out right away and fix her [facility] when it breaks.   Dr. Halford Chessman, please advise.

## 2020-08-23 NOTE — Patient Instructions (Signed)
Visit Information It was nice speaking with you today.   Goals Addressed             This Visit's Progress    COMPLETED: Find Help in My Community by attending Watch Watchers meetings       Timeframe:  Long-Range Goal Priority:  High Start Date:     02/05/20                        Expected End Date:                       Follow Up Date    - follow-up on any referrals for help I am given  - continue to attend Weight Watchers for assistance with weight management    Why is this important?   Knowing how and where to find help for yourself or family in your neighborhood and community is an important skill.  You will want to take some steps to learn how.    Notes: goal met - joined weight watchers in July 2021      Make and Keep All Appointments       Timeframe:  Long-Range Goal Priority:  High Start Date:   02/05/20                          Expected End Date:     ongoing                  Follow Up Date 10/18/20   - call to cancel if needed - keep a calendar with appointment dates    Why is this important?   Part of staying healthy is seeing the doctor for follow-up care.  If you forget your appointments, there are some things you can do to stay on track.    Notes: 08/23/20- meeting goal      [place] My Quality of Life       Timeframe:  Long-Range Goal Priority:  High Start Date:           02/05/20                  Expected End Date:     ongoing                  Follow Up Date 10/18/20   - do one enjoyable thing every day    Why is this important?   Having a long-term illness can be scary.  It can also be stressful for you and your caregiver.  These steps may help.    Notes: 08/23/20- continues to attend exercise class and continues to lose weight         The patient verbalized understanding of instructions, educational materials, and care plan provided today and declined offer to receive copy of patient instructions, educational materials, and care plan.    The care management team will reach out to the patient again over the next 30 days.   [name], [name], [facility] RN Care Manager [phone]

## 2020-08-26 ENCOUNTER — Ambulatory Visit: Payer: Medicaid Other | Admitting: *Deleted

## 2020-08-26 DIAGNOSIS — E785 Hyperlipidemia, unspecified: Secondary | ICD-10-CM

## 2020-08-26 DIAGNOSIS — G4733 Obstructive sleep apnea (adult) (pediatric): Secondary | ICD-10-CM

## 2020-08-26 DIAGNOSIS — M25512 Pain in left shoulder: Secondary | ICD-10-CM

## 2020-08-26 DIAGNOSIS — E119 Type 2 diabetes mellitus without complications: Secondary | ICD-10-CM

## 2020-08-26 DIAGNOSIS — I10 Essential (primary) hypertension: Secondary | ICD-10-CM

## 2020-08-26 DIAGNOSIS — N182 Chronic kidney disease, stage 2 (mild): Secondary | ICD-10-CM

## 2020-08-26 NOTE — Chronic Care Management (AMB) (Signed)
  Care Management   Note  08/26/2020 Name: Kristen Frost MRN: [id number] DOB: 1956/01/28  [name] is enrolled in a Managed Medicaid plan: Yes. Outreach attempt today was successful.   Advised patient via phone that Dr. Heide Scales completed the reasonable accommodations form and that this RNCM successfully faxed it to patient's property manager, Ms Laurance Flatten at [place] at fax # [phone]. Per patient request will e-mail patient a copy of the completed form.   Patient has a managed Medicaid health plan so the The Managed Medicaid care management team will reach out to the patient again over the next 30 days.   [name], [name], [facility] RN Care Manager [phone]

## 2020-08-30 ENCOUNTER — Other Ambulatory Visit: Payer: Self-pay | Admitting: Student

## 2020-08-30 DIAGNOSIS — R0981 Nasal congestion: Secondary | ICD-10-CM

## 2020-09-02 ENCOUNTER — Encounter: Payer: Self-pay | Admitting: Pulmonary Disease

## 2020-09-02 NOTE — Telephone Encounter (Signed)
Spoke with Barnet Pall, she has placed the letter in the mail and spoke with patient.   Will close encounter.

## 2020-09-02 NOTE — Telephone Encounter (Signed)
Letter printed and can be picked up in B pod.

## 2020-09-08 ENCOUNTER — Telehealth: Payer: Self-pay | Admitting: Student

## 2020-09-08 NOTE — Telephone Encounter (Signed)
..   Medicaid Managed Care   Unsuccessful Outreach Note  09/08/2020 Name: Kristen Frost MRN: [id number] DOB: 02-25-1955  Referred by: [name], MD Reason for referral : High Risk Managed Medicaid (Called patient to schedule her with the MM Team. Left a message on her VM.)   A second unsuccessful telephone outreach was attempted today. The patient was referred to the case management team for assistance with care management and care coordination.   Follow Up Plan: The care management team will reach out to the patient again over the next 7-14 days.   [place]

## 2020-09-08 NOTE — Telephone Encounter (Signed)
.   Medicaid Managed Care   Unsuccessful Outreach Note  09/08/2020 Name: Kristen Frost MRN: [id number] DOB: 1955-07-01  Referred by: [name], MD Reason for referral : High Risk Managed Medicaid (Connected with patient today by phone but she stated she was in the store and could not talk at this time. I advised I would call her back this afternoon.)   An unsuccessful telephone outreach was attempted today. The patient was referred to the case management team for assistance with care management and care coordination.   Follow Up Plan: The care management team will reach out to the patient again over the next 7-14 days.   [place]

## 2020-09-09 ENCOUNTER — Ambulatory Visit
Admission: RE | Admit: 2020-09-09 | Discharge: 2020-09-09 | Disposition: A | Discharge status: Home / Self Care | Payer: Medicaid Other | Source: Ambulatory Visit | Attending: Internal Medicine | Admitting: Internal Medicine

## 2020-09-09 ENCOUNTER — Other Ambulatory Visit: Payer: Self-pay

## 2020-10-04 ENCOUNTER — Other Ambulatory Visit: Payer: Self-pay | Admitting: Student

## 2020-10-04 DIAGNOSIS — R0981 Nasal congestion: Secondary | ICD-10-CM

## 2020-11-03 IMAGING — CR DG HIP (WITH OR WITHOUT PELVIS) 3-4V BILAT
5 series · 5 of 5 positions shown · non-contrast
Comparison: None.

CLINICAL DATA: Low back pain post fall, BILATERAL buttock pain
radiating down posterior aspect of both legs, tailbone pain

EXAM:
DG HIP (WITH OR WITHOUT PELVIS) 3-4V BILAT

[t pelvis ap]
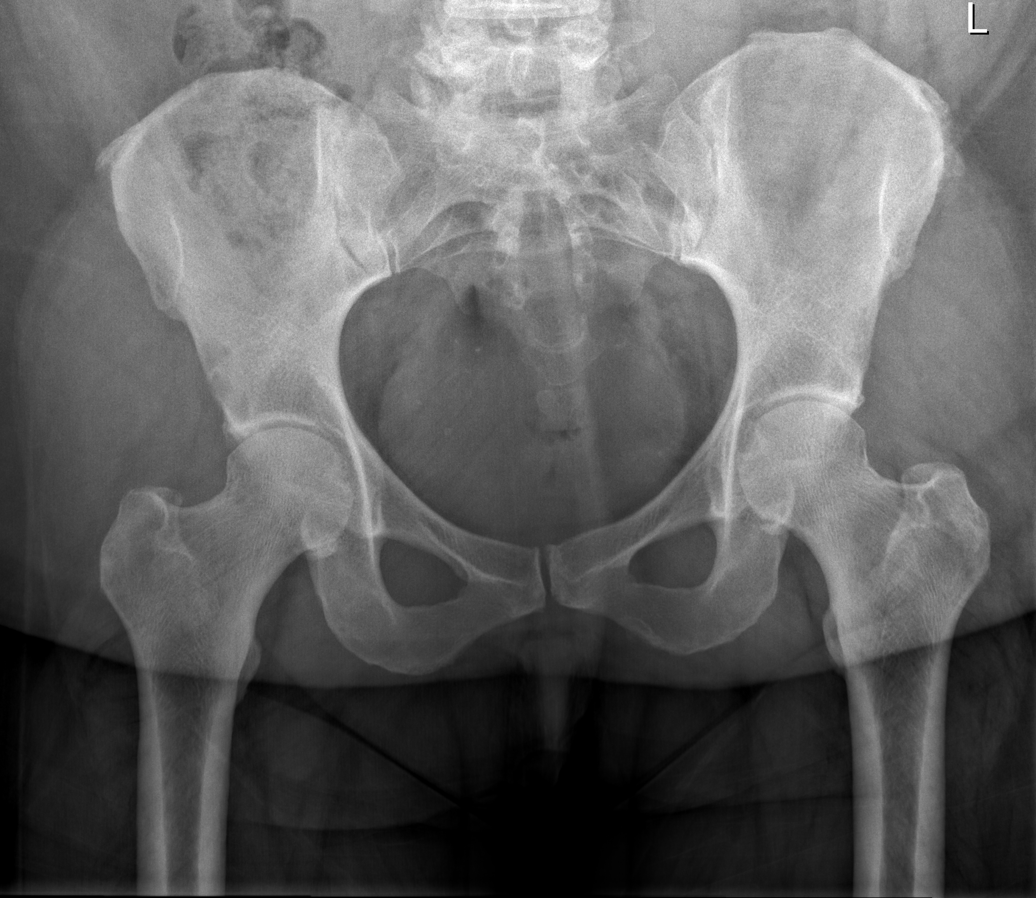

[t hip ap left]
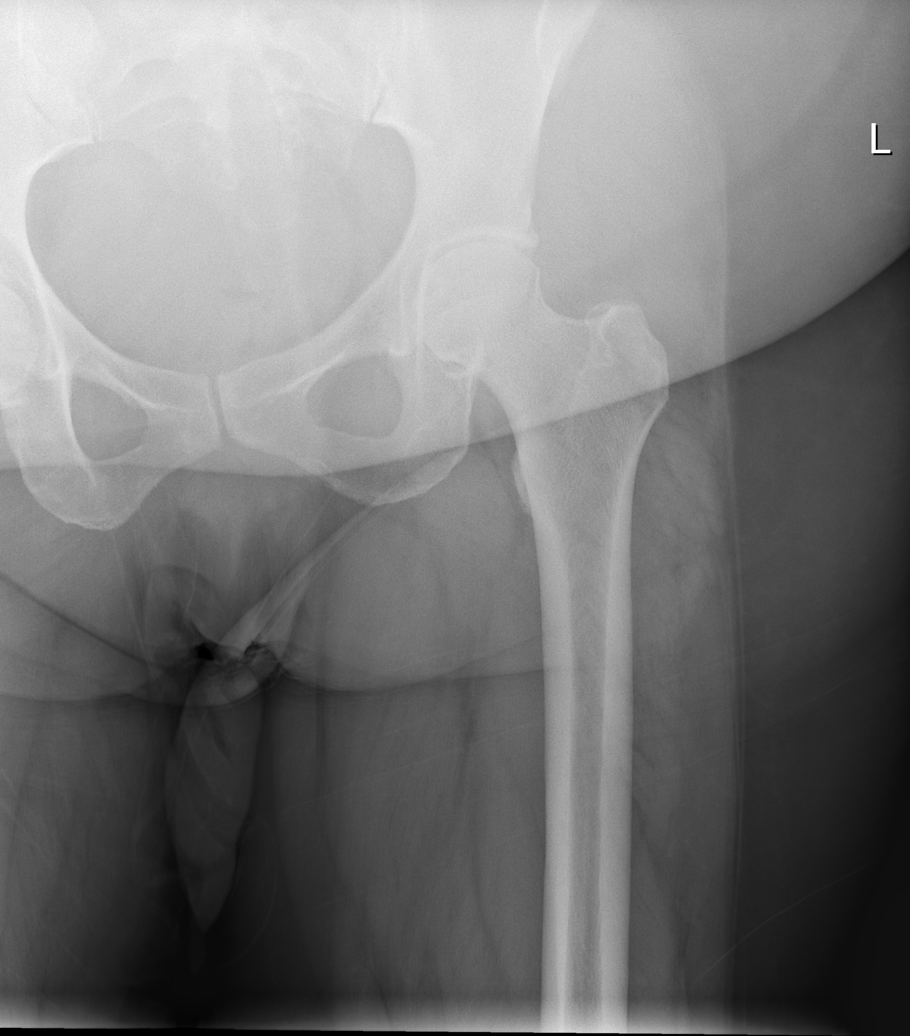

[t hip ap right]
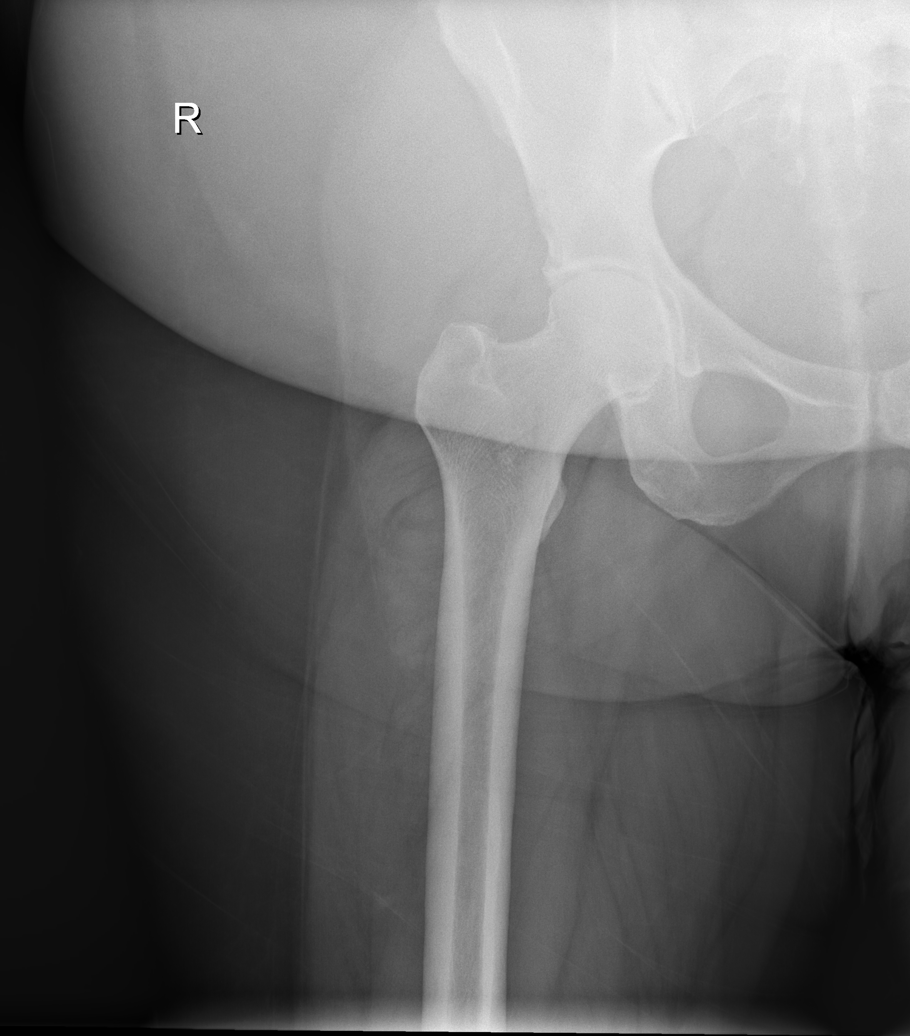

[t hip frog leg right]
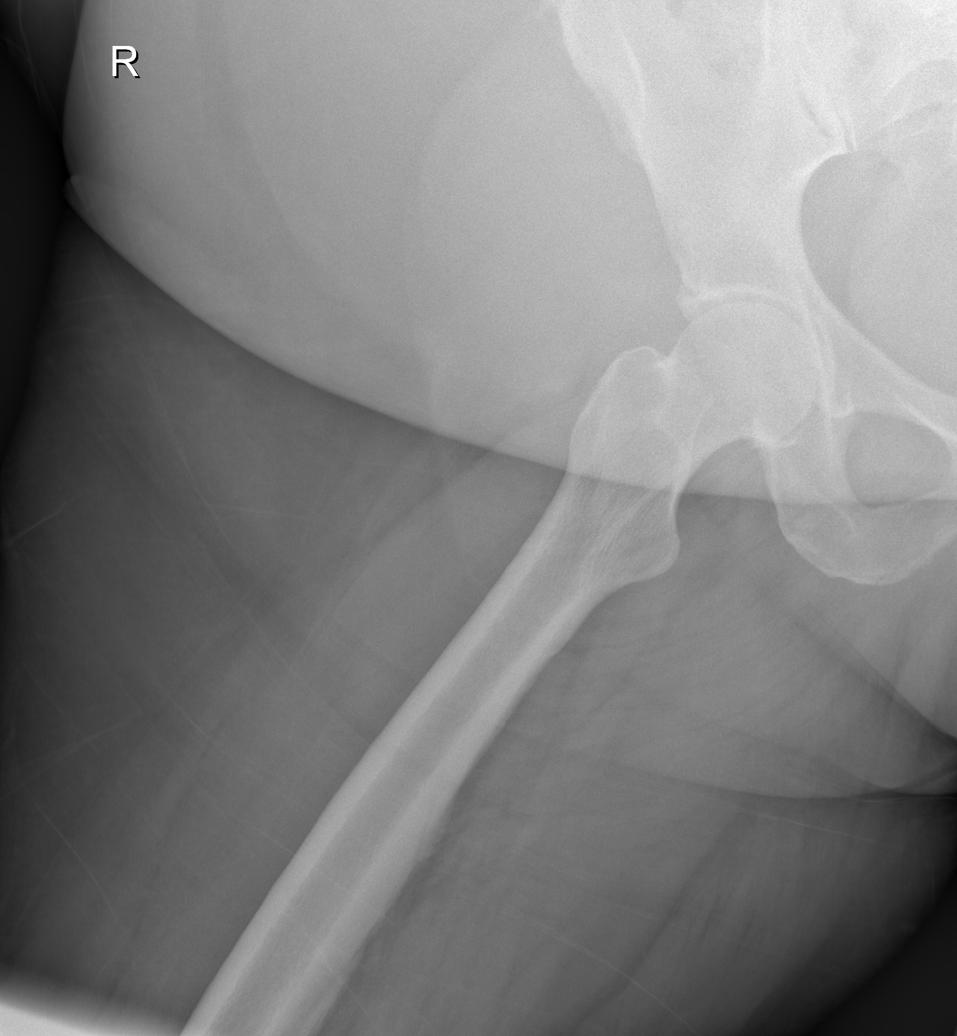

[t hip frog leg left]
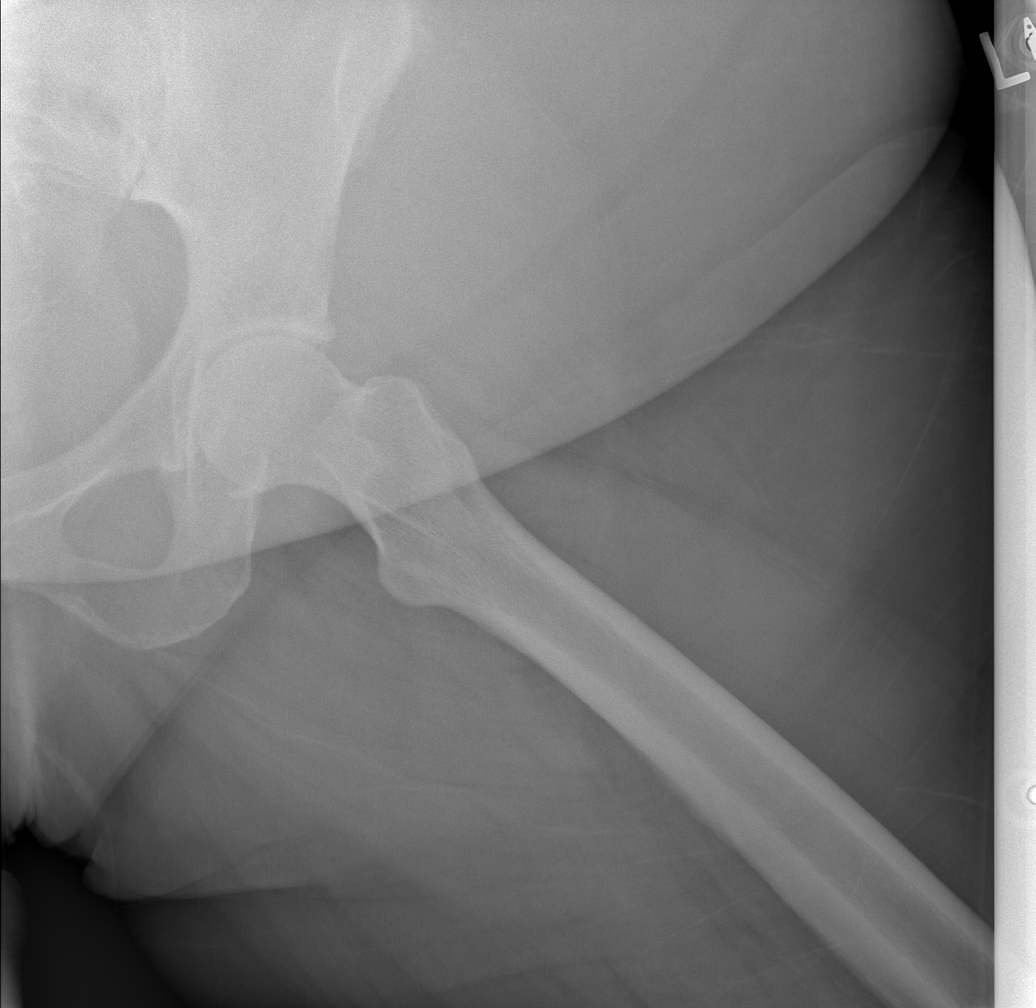

[5 of 5 positions shown; findings below may reference images not displayed]

FINDINGS: Hip and SI joint spaces preserved.

Osseous mineralization normal.

No fracture, dislocation, or bone destruction.

Degenerative facet disease changes at visualized lower lumbar spine.
IMPRESSION: Negative radiographs of the hips.

## 2020-11-03 IMAGING — CR DG LUMBAR SPINE COMPLETE 4+V
5 series · 5 of 5 positions shown · non-contrast
Comparison: None

CLINICAL DATA: Low back pain post fall, BILATERAL buttock pain
radiating down posterior aspect of both legs, tailbone pain

EXAM:
LUMBAR SPINE - COMPLETE 4+ VIEW

[t lumbar spine ap]
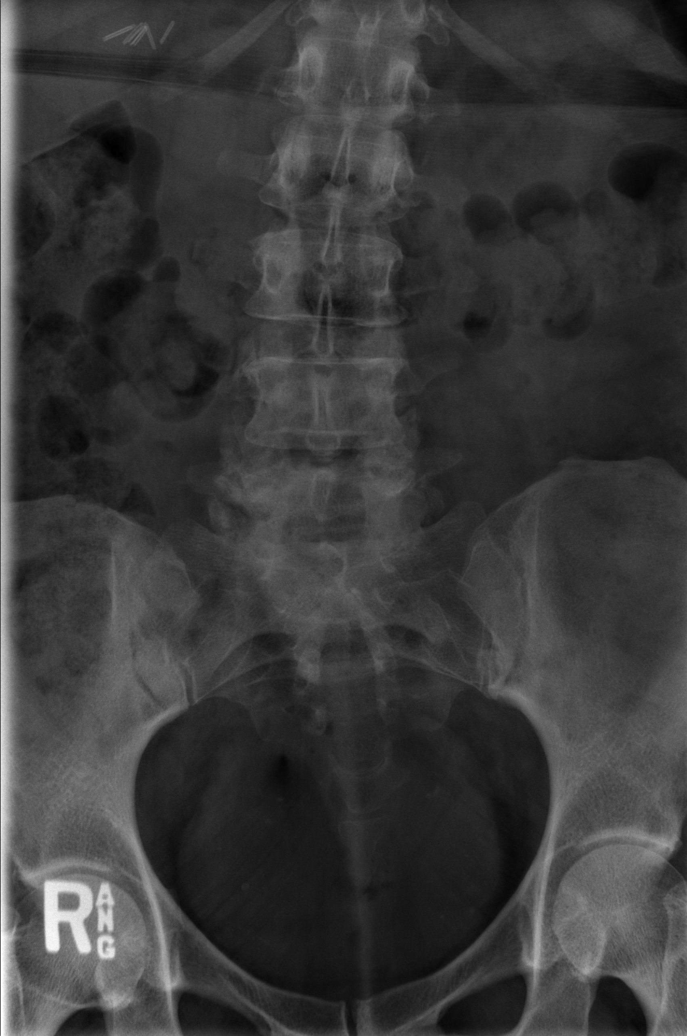

[t lumbar spine obl (1 of 2)]
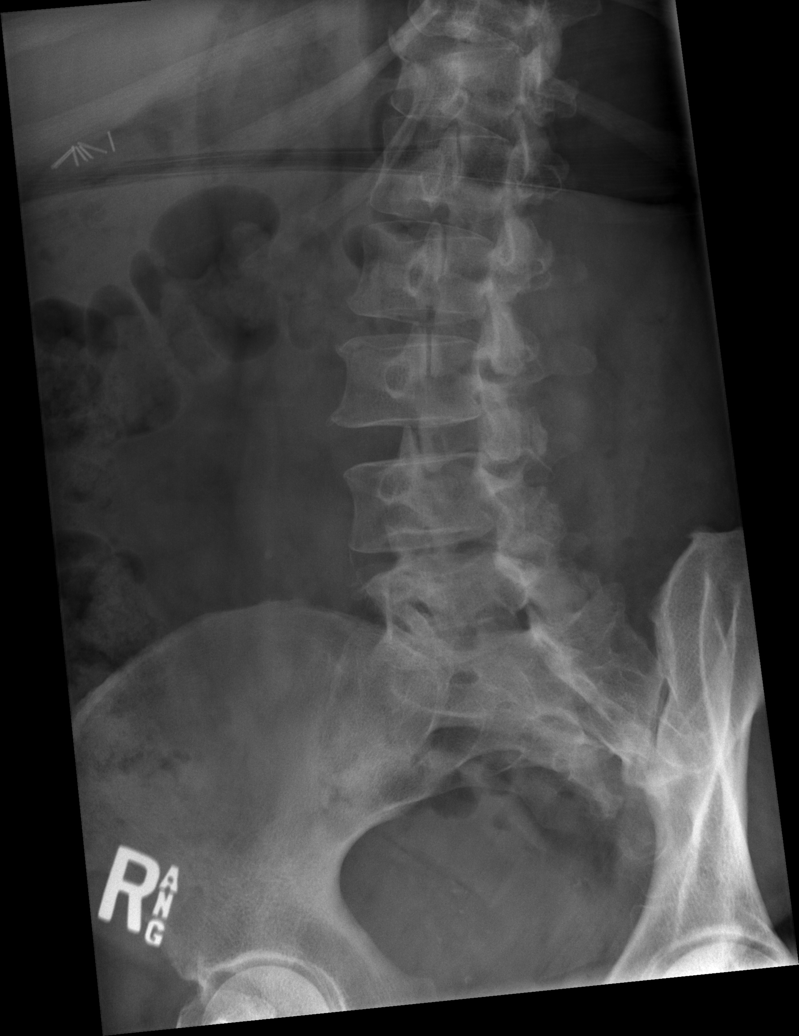

[t lumbar spine obl (2 of 2)]
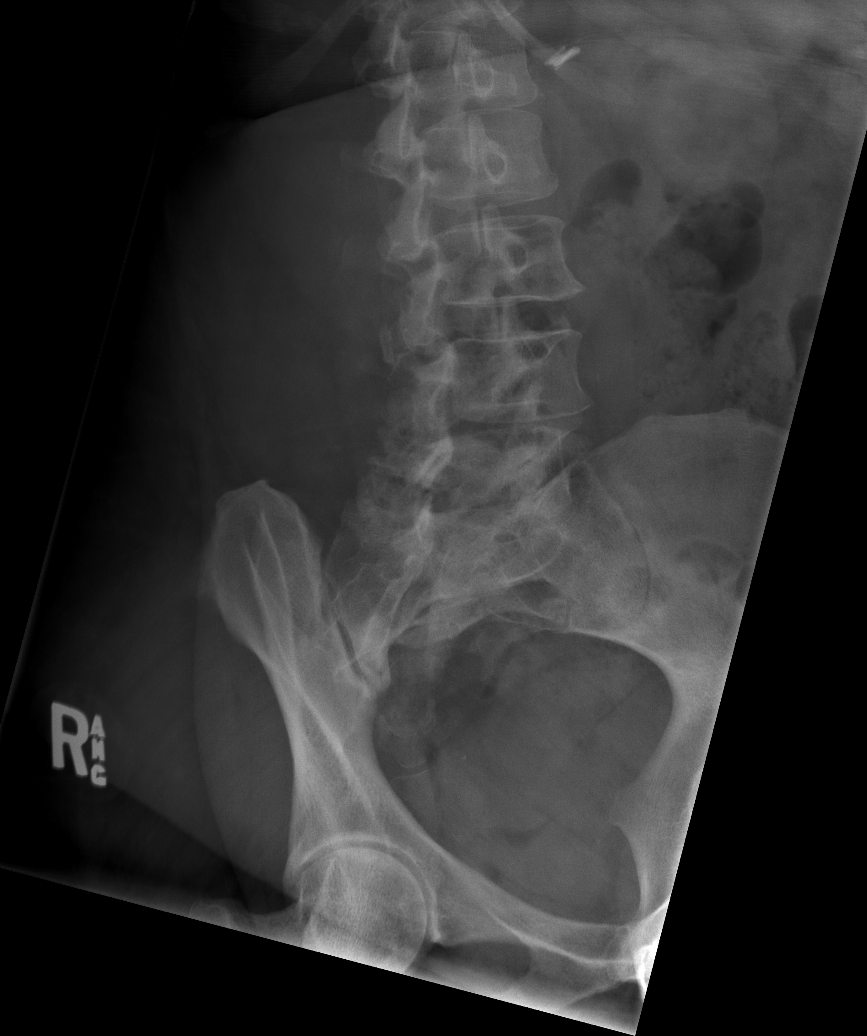

[t lumbar spine lat]
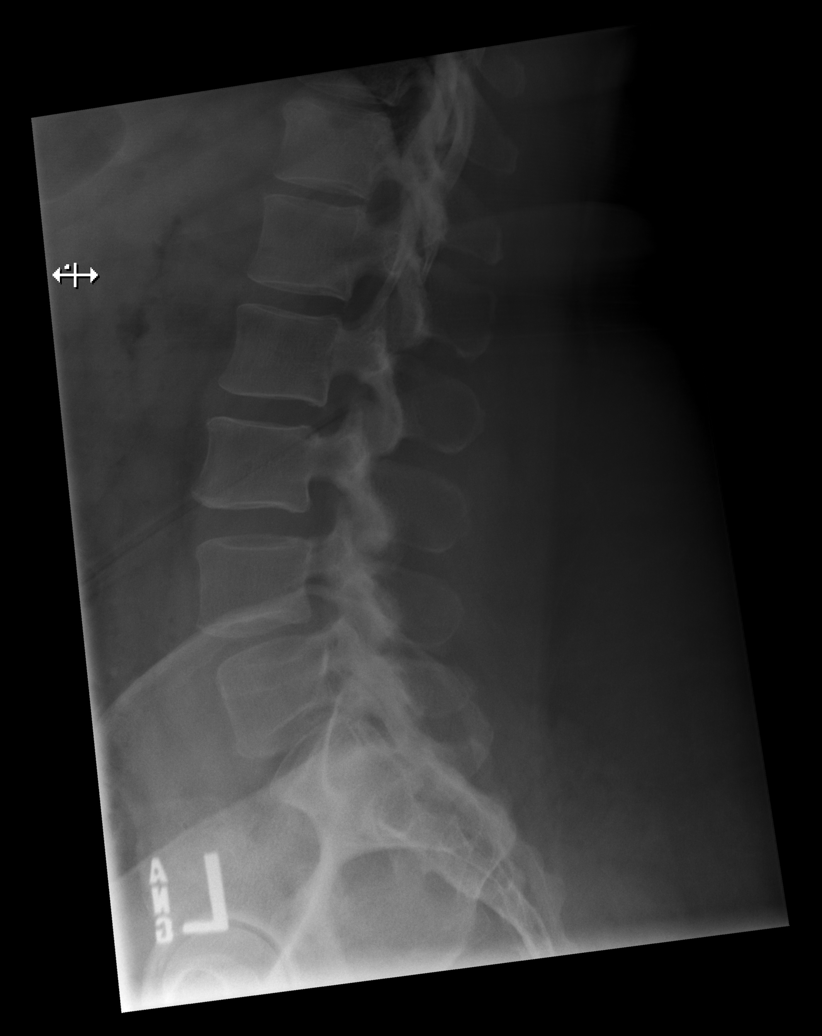

[t lumbar l-5 s-1 spot]
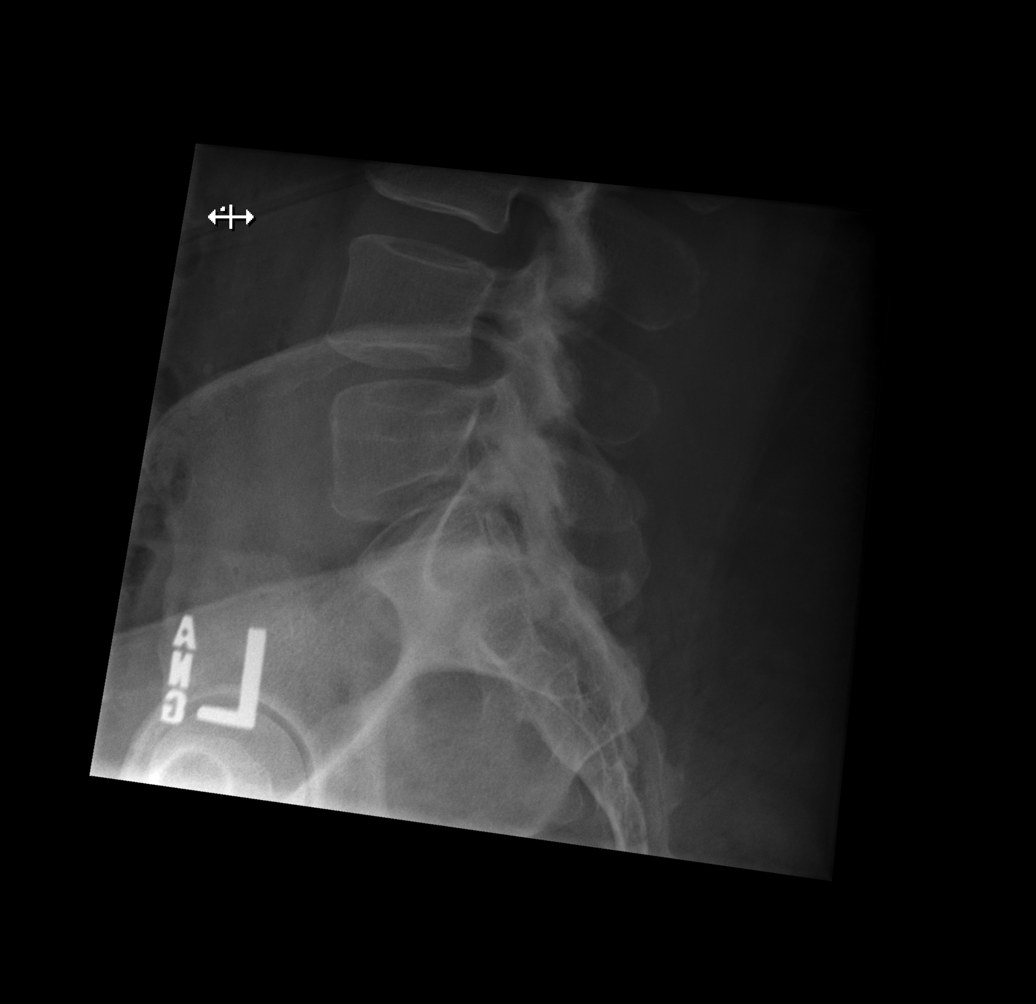

[5 of 5 positions shown; findings below may reference images not displayed]

FINDINGS: Five non-rib-bearing lumbar vertebra.

Osseous mineralization normal for technique.

Facet degenerative changes L4-L5 and L5-S1.

Vertebral body and disc space heights maintained.

Minimal anterolisthesis L4-L5.

No fracture, additional subluxation, or bone destruction.

No spondylolysis.

SI joints preserved.
IMPRESSION: Facet degenerative changes lower lumbar spine with minimal
anterolisthesis L4-L5.

No acute abnormalities.

## 2020-11-05 IMAGING — CT CT HEAD W/O CM
3 series · 16 of 47 positions shown, 19 images · non-contrast
Comparison: None.

CLINICAL DATA: Nausea after fall.

EXAM:
CT HEAD WITHOUT CONTRAST
TECHNIQUE: Contiguous axial images were obtained from the base of the skull
through the vertex without intravenous contrast.

[Series 2: head wo · axial · 0.46mm/px · z∈[-40,+100]mm · 10 of 34 slices shown, 13 images]
[im 3/34  brain]
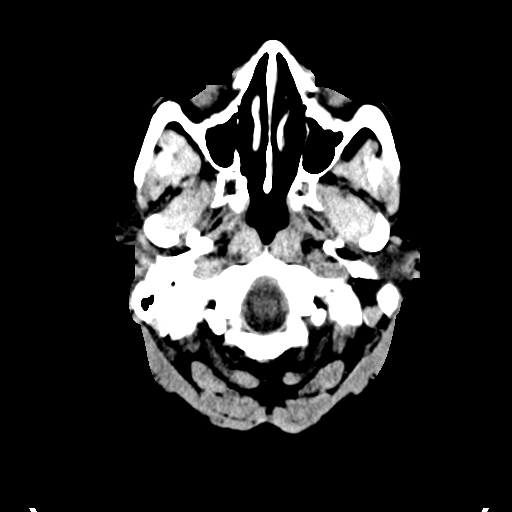
[im 3/34  bone]
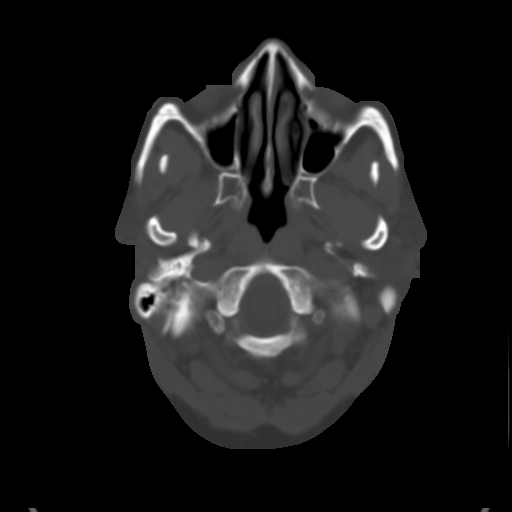
[im 6/34  brain]
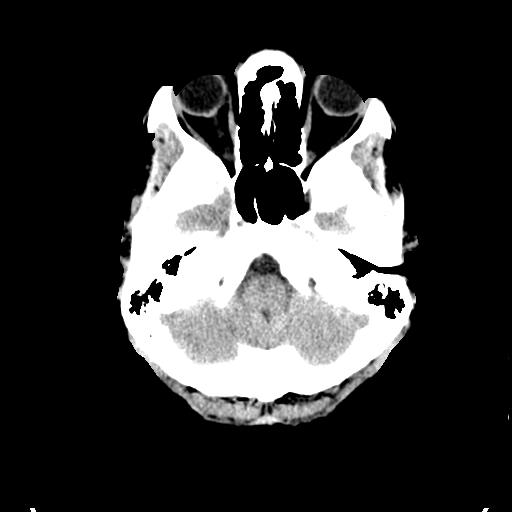
[im 10/34  brain]
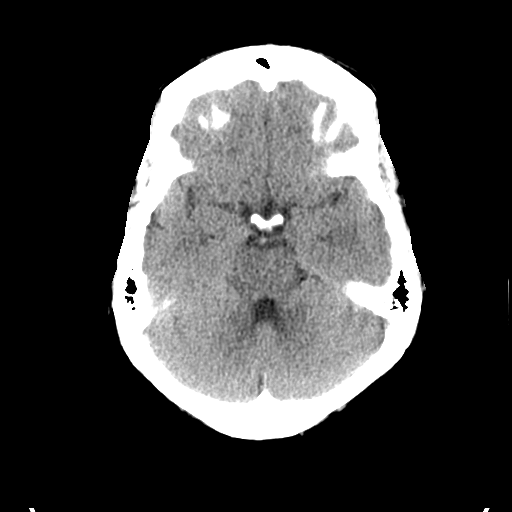
[im 12/34  brain]
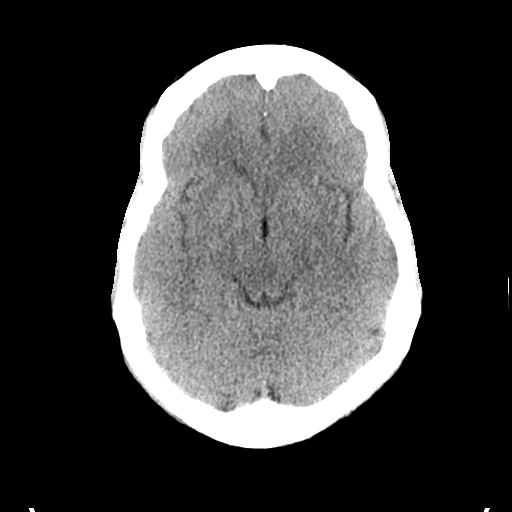
[im 15/34  brain]
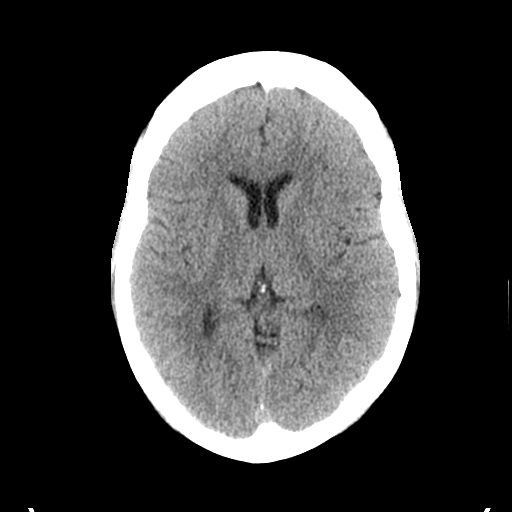
[im 15/34  bone]
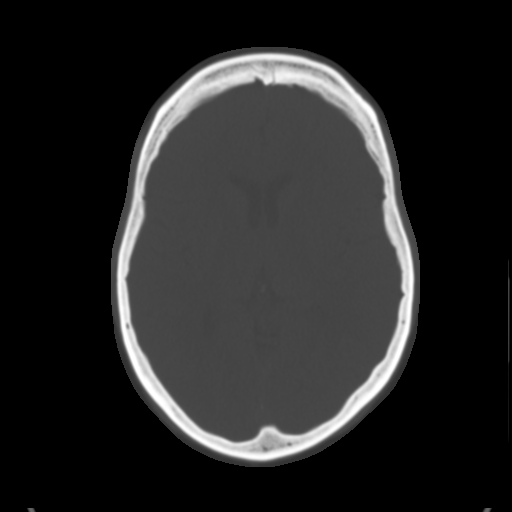
[im 19/34  brain]
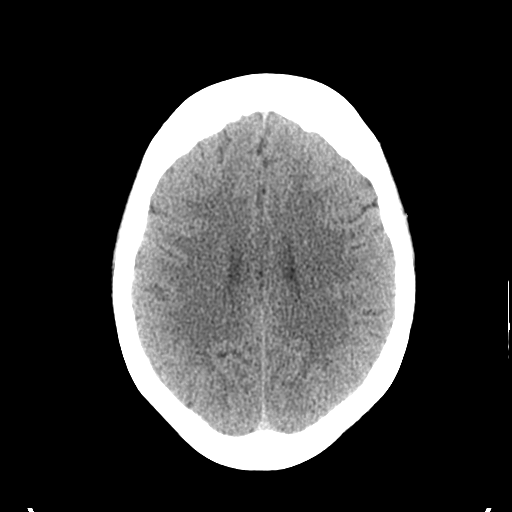
[im 22/34  brain]
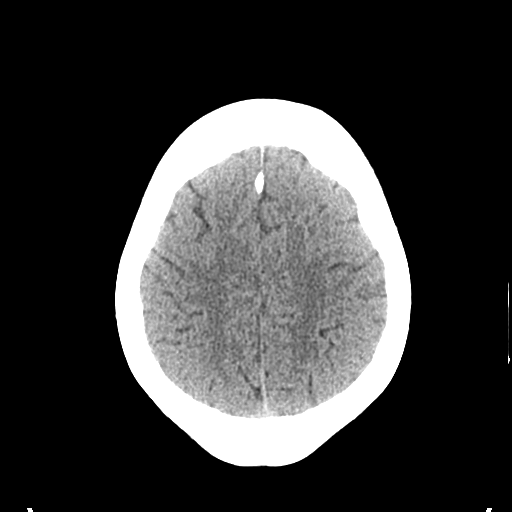
[im 26/34  brain]
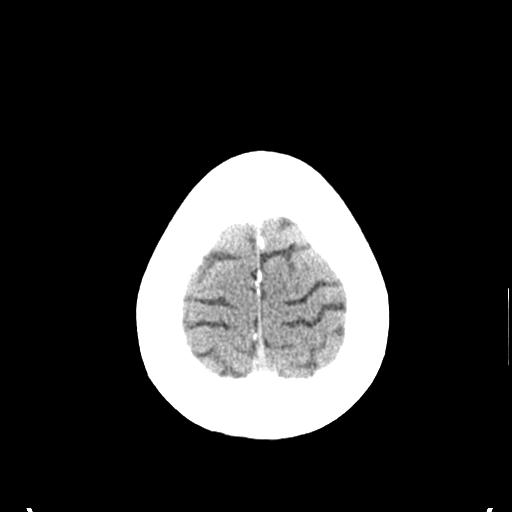
[im 28/34  brain]
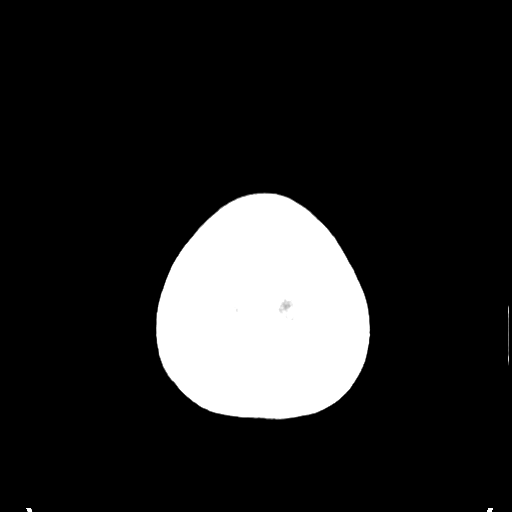
[im 28/34  bone]
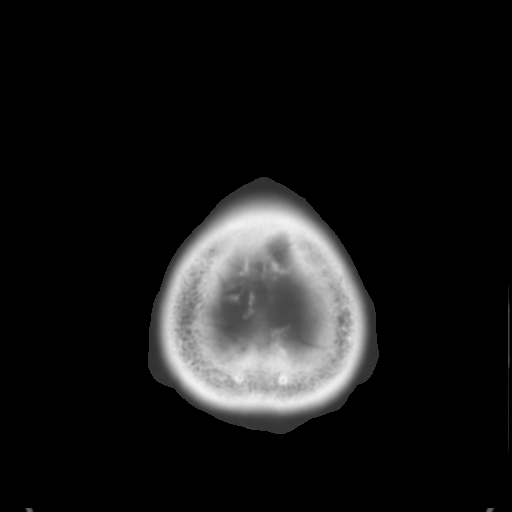
[im 31/34  brain]
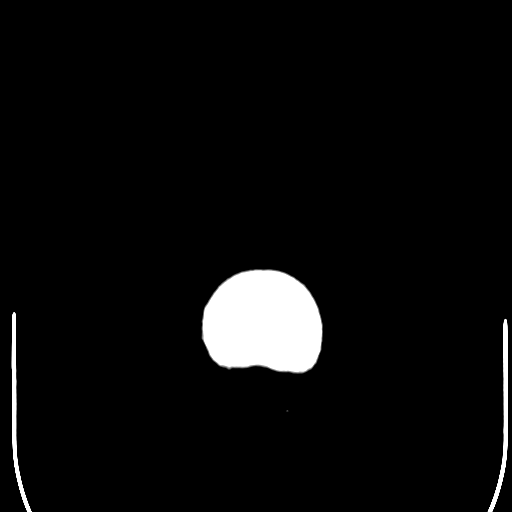

[Series 4: coronal soft tissue · coronal · 0.33mm/px · 3 of 72 slices shown]
[im 24/72  brain]
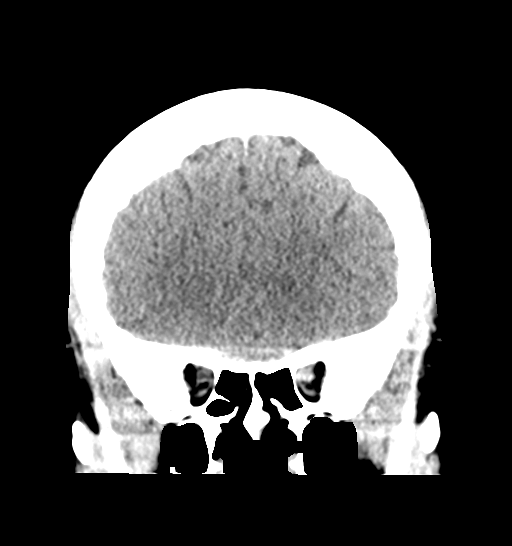
[im 32/72  brain]
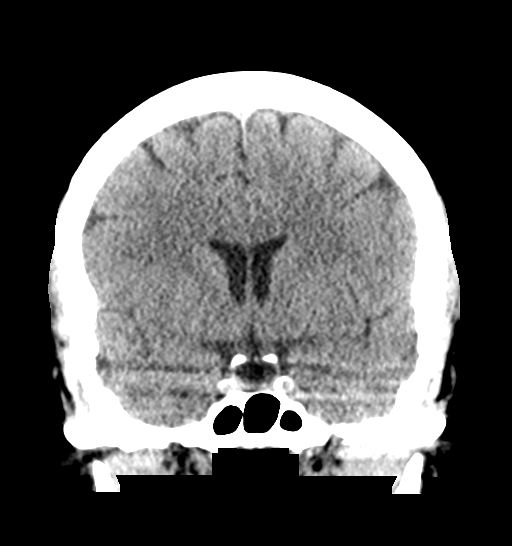
[im 40/72  brain]
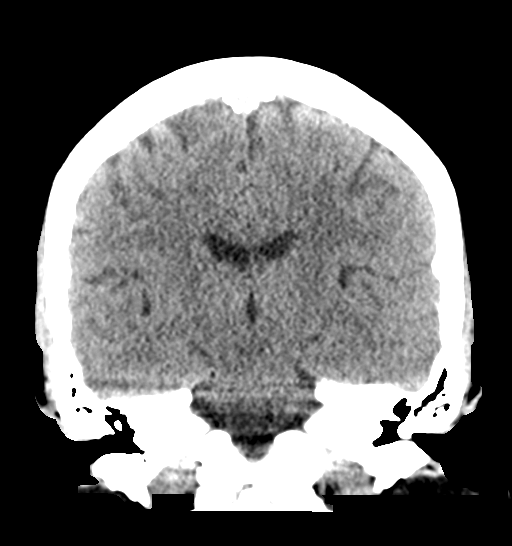

[Series 5: sagittal soft tissue · sagittal · 0.35mm/px · 3 of 57 slices shown]
[im 19/57  brain]
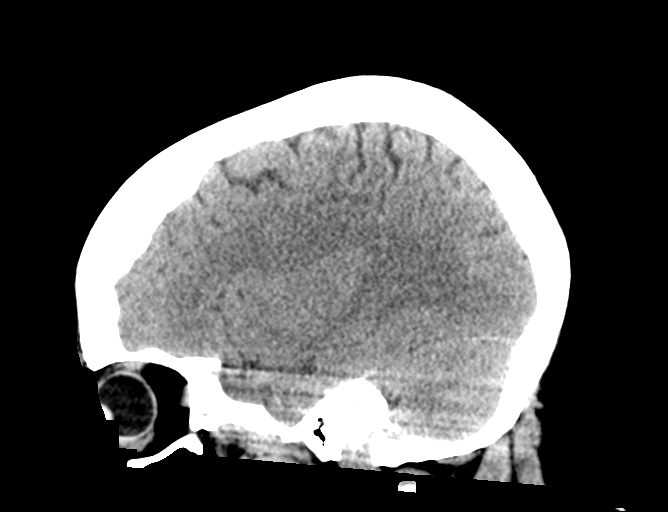
[im 29/57  brain]
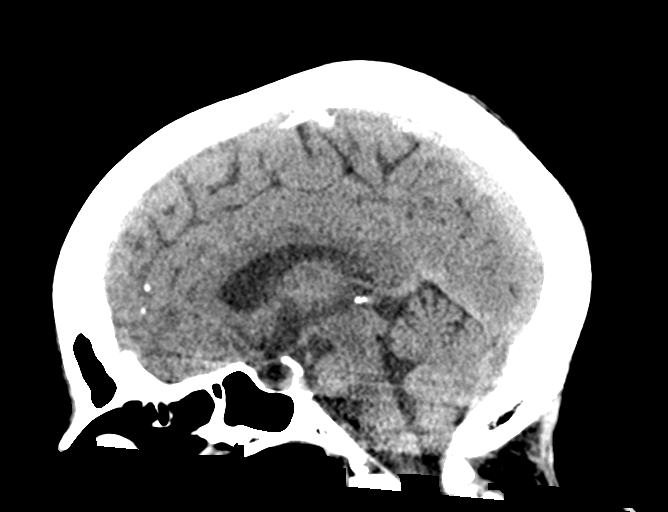
[im 38/57  brain]
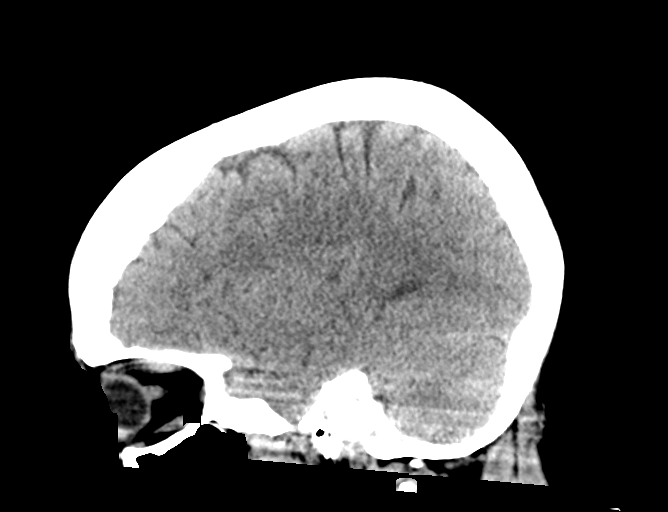

[16 of 47 positions shown; findings below may reference images not displayed]

FINDINGS: Brain: No evidence of acute infarction, hemorrhage, hydrocephalus,
extra-axial collection or mass lesion/mass effect.

Vascular: No hyperdense vessel or unexpected calcification.

Skull: Normal. Negative for fracture or focal lesion.

Sinuses/Orbits: No acute finding.

Other: None.
IMPRESSION: Normal head CT.

## 2020-11-22 ENCOUNTER — Other Ambulatory Visit: Payer: Self-pay

## 2020-11-22 DIAGNOSIS — E119 Type 2 diabetes mellitus without complications: Secondary | ICD-10-CM

## 2020-11-22 MED ORDER — VICTOZA 18 MG/3ML ~~LOC~~ SOPN
PEN_INJECTOR | SUBCUTANEOUS | 2 refills | Status: DC
Start: 2020-11-22 — End: 2020-12-19

## 2020-11-22 NOTE — Telephone Encounter (Signed)
liraglutide (VICTOZA) 18 MG/3ML SOPN, refill request @  [facility],  - [address] AT [address].

## 2020-11-22 NOTE — Telephone Encounter (Signed)
Next appt scheduled 12/19/20 with Dr Marva Panda.

## 2020-12-07 ENCOUNTER — Other Ambulatory Visit: Payer: Self-pay | Admitting: Student

## 2020-12-07 DIAGNOSIS — R0981 Nasal congestion: Secondary | ICD-10-CM

## 2020-12-12 ENCOUNTER — Other Ambulatory Visit: Payer: Self-pay

## 2020-12-12 DIAGNOSIS — I10 Essential (primary) hypertension: Secondary | ICD-10-CM

## 2020-12-12 MED ORDER — AMLODIPINE BESYLATE 10 MG PO TABS: ORAL_TABLET | ORAL | 3 refills | Status: DC

## 2020-12-19 ENCOUNTER — Encounter: Payer: Self-pay | Admitting: Internal Medicine

## 2020-12-19 ENCOUNTER — Other Ambulatory Visit: Payer: Self-pay

## 2020-12-19 ENCOUNTER — Ambulatory Visit (INDEPENDENT_AMBULATORY_CARE_PROVIDER_SITE_OTHER): Payer: Medicare Other | Admitting: Internal Medicine

## 2020-12-19 VITALS — BP 127/79 | HR 86 | Temp 98.2°F | Ht 63.0 in | Wt 233.9 lb

## 2020-12-19 DIAGNOSIS — Z13820 Encounter for screening for osteoporosis: Secondary | ICD-10-CM

## 2020-12-19 DIAGNOSIS — M25561 Pain in right knee: Secondary | ICD-10-CM | POA: Insufficient documentation

## 2020-12-19 DIAGNOSIS — E119 Type 2 diabetes mellitus without complications: Secondary | ICD-10-CM

## 2020-12-19 DIAGNOSIS — F419 Anxiety disorder, unspecified: Secondary | ICD-10-CM | POA: Diagnosis not present

## 2020-12-19 DIAGNOSIS — E785 Hyperlipidemia, unspecified: Secondary | ICD-10-CM

## 2020-12-19 DIAGNOSIS — E559 Vitamin D deficiency, unspecified: Secondary | ICD-10-CM

## 2020-12-19 DIAGNOSIS — I10 Essential (primary) hypertension: Secondary | ICD-10-CM | POA: Diagnosis not present

## 2020-12-19 DIAGNOSIS — E2839 Other primary ovarian failure: Secondary | ICD-10-CM | POA: Diagnosis not present

## 2020-12-19 LAB — GLUCOSE, CAPILLARY: Glucose-Capillary: 93 mg/dL (ref 70–99)

## 2020-12-19 LAB — POCT GLYCOSYLATED HEMOGLOBIN (HGB A1C): Hemoglobin A1C: 5.4 % (ref 4.0–5.6)

## 2020-12-19 MED ORDER — DICLOFENAC SODIUM 1 % EX GEL: 4.0000 g | Freq: Four times a day (QID) | CUTANEOUS | 1 refills | Status: AC

## 2020-12-19 MED ORDER — OZEMPIC (1 MG/DOSE) 4 MG/3ML ~~LOC~~ SOPN: 1.0000 mg | PEN_INJECTOR | SUBCUTANEOUS | 0 refills | Status: AC

## 2020-12-19 NOTE — Assessment & Plan Note (Signed)
BP Readings from Last 3 Encounters:  12/19/20 127/79  07/19/20 127/61  04/08/20 129/78   Ms Kristen Frost has a history of hypertension well controlled on amlodipine 10mg  daily. She reports medication adherence and denies any headache, lightheadedness/dizziness, chest pain, shortness of breath, lower extremity swelling.  Plan Continue amlodipine 10 mg Follow-up in 3 months

## 2020-12-19 NOTE — Assessment & Plan Note (Signed)
[name] has a history of type 2 diabetes for which she is on metformin XR 500 mg and Victoza 1.8 daily.  She reports medication adherence but notes that in light of recent stressors, patient has not been able to follow a strict diet and has been drinking sodas and eating chocolates.  She does continue to exercise daily.  Patient's A1c 5.8> 5.4 at this visit.  Patient would like to discontinue her metformin due to GI side effects.  Also discussed changing her Victoza to Ozempic to decrease daily medication dosing.  Patient is agreeable to this.   Plan Switch Victoza 1.8 mg daily to Ozempic 1 mg weekly Discontinue metformin XR 500mg  daily Follow-up in 4 weeks for adjustment of Ozempic dosing Repeat A1c in 6 months

## 2020-12-19 NOTE — Progress Notes (Signed)
   CC: hypertension and diabetes follow up   HPI:  [name] is a 65 y.o. female with PMHx as stated below presenting for follow up of her diabetes and hypertension. She does not have any acute concerns at this time but does report significant anxiety surrounding multiple recent life stressors. Please see problem based charting for complete assessment and plan.  Past Medical History:  Diagnosis Date   Anxiety    Arthritis    Bleeding hemorrhoids    grade 2   Diverticulosis of colon    Full dentures    History of colon polyps    Hyperlipidemia    Hypertension    followed by pcp   (04-30-2019  per pt had stress test approx. 2005, was told normal (not available in epic)   IDA (iron deficiency anemia)    OSA on CPAP    followed by dr Halford Chessman ([facility])    (04-30-2019  pt  stated uses every night)   Restless leg syndrome    Type 2 diabetes mellitus ([place])    followed by pcp   (04-30-2019  pt stated does not check blood sugar at home)   Review of Systems:  Negative except as stated in HPI  Physical Exam:  Vitals:   12/19/20 0924  BP: 127/79  Pulse: 86  Temp: 98.2 F (36.8 C)  TempSrc: Oral  SpO2: 97%  Weight: 233 lb 14.4 oz (106.1 kg)  Height: 5\' 3"  (1.6 m)   Physical Exam  Constitutional: Middle aged obese female, generally well-appearing, No distress.  HENT: Normocephalic and atraumatic Cardiovascular: Normal rate, regular rhythm, S1 and S2 present, no murmurs, rubs, gallops.  Distal pulses intact Respiratory: Effort is normal.  Lungs are clear to auscultation bilaterally. Musculoskeletal: Normal bulk and tone.  No peripheral edema noted. Neurological: Is alert and oriented x4, no apparent focal deficits noted. Skin: Warm and dry.  No rash, erythema, lesions noted. Psychiatric: Normal mood and affect.   Assessment & Plan:   See Encounters Tab for problem based charting.  Patient discussed with Dr. Philipp Ovens

## 2020-12-19 NOTE — Assessment & Plan Note (Signed)
Patient reports intermittent right knee pain that has been bothering her more so since the weather is changing. She reports history of osteoarthritis and notes that current pain is similar. She does note some relief with IcyHot. On examination, no effusion,tenderness to palpation, erythema or warmth appreciated.   Plan: Voltaren gel 1% qid prn

## 2020-12-19 NOTE — Patient Instructions (Addendum)
Ms Joyann Spidle,  It was a pleasure seeing you in clinic. Today we discussed:   Right knee pain: Prescription for voltaren gel sent to pharmacy  Anxiety: Our therapist, Dr Theodis Shove, will give you a call to follow up   Diabetes/Weight loss; Your A1c is 5.4 today. Great job! At this time, you may discontinue your metformin. We will switch the Victoza to Ozempic as discussed. Please follow up in 4 weeks  If you have any questions or concerns, please call our clinic at [phone] between 9am-5pm and after hours call [phone] and ask for the internal medicine resident on call. If you feel you are having a medical emergency please call 911.   Thank you, we look forward to helping you remain healthy!

## 2020-12-19 NOTE — Assessment & Plan Note (Signed)
Lipid Panel     Component Value Date/Time   CHOL 152 04/08/2020 0923   TRIG 72 04/08/2020 0923   HDL 58 04/08/2020 0923   CHOLHDL 2.6 04/08/2020 0923   LDLCALC 80 04/08/2020 0923   LABVLDL 14 04/08/2020 0923   ASCVD risk:15.7% Patient is currrently on lipitor 40mg  daily. She reports taking this daily as prescribed and denies any side effects at this time. She would like to get her LFT's checked in setting of ongoing statin use and prior elevated AST/ALT.   Plan: Continue Lipitor daily CMP today

## 2020-12-19 NOTE — Assessment & Plan Note (Signed)
Given history of severe obesity, hypertension and diabetes, patient at risk for NAFLD. Prior elevated LFT's in 2018.  Plan: CMP today Continue to encourage weight loss; uptitrate Ozempic to 2.4mg  weekly

## 2020-12-19 NOTE — Assessment & Plan Note (Signed)
Patient reports significant situational anxiety over the past several months surrounding her husband's hospitalization with a possible liver lesion, the process of purchasing a new home, and her daughter having COVID. She reports significant stress and notes that she has been coping with music and increasing her chocolate intake.  Discussed talking to a therapist for which she is agreeable.   Plan: Referral to Dr Theodis Shove

## 2020-12-19 NOTE — Assessment & Plan Note (Signed)
Patient with history of vitamin D deficiency for which she takes 2000U daily. Given estrogen deficiency as well, increased risk for osteoporosis.  Plan: DEXA scan

## 2020-12-20 ENCOUNTER — Encounter: Payer: Self-pay | Admitting: Student

## 2020-12-20 LAB — CMP14 + ANION GAP
ALT: 17 IU/L (ref 0–32)
AST: 25 IU/L (ref 0–40)
Albumin/Globulin Ratio: 1.5 (ref 1.2–2.2)
Albumin: 4.4 g/dL (ref 3.8–4.8)
Alkaline Phosphatase: 123 IU/L — ABNORMAL HIGH (ref 44–121)
Anion Gap: 13 mmol/L (ref 10.0–18.0)
BUN/Creatinine Ratio: 13 (ref 12–28)
BUN: 14 mg/dL (ref 8–27)
Bilirubin Total: 0.5 mg/dL (ref 0.0–1.2)
CO2: 25 mmol/L (ref 20–29)
Calcium: 9.9 mg/dL (ref 8.7–10.3)
Chloride: 103 mmol/L (ref 96–106)
Creatinine, Ser: 1.06 mg/dL — ABNORMAL HIGH (ref 0.57–1.00)
Globulin, Total: 2.9 g/dL (ref 1.5–4.5)
Glucose: 85 mg/dL (ref 70–99)
Potassium: 4.3 mmol/L (ref 3.5–5.2)
Sodium: 141 mmol/L (ref 134–144)
Total Protein: 7.3 g/dL (ref 6.0–8.5)
eGFR: 58 mL/min/{1.73_m2} — ABNORMAL LOW (ref >59)

## 2020-12-27 ENCOUNTER — Encounter: Payer: Self-pay | Admitting: *Deleted

## 2020-12-27 NOTE — Progress Notes (Unsigned)

## 2021-01-02 NOTE — Progress Notes (Signed)
Internal Medicine Clinic Attending ° °Case discussed with Dr. Aslam  At the time of the visit.  We reviewed the resident’s history and exam and pertinent patient test results.  I agree with the assessment, diagnosis, and plan of care documented in the resident’s note.  °

## 2021-01-10 ENCOUNTER — Institutional Professional Consult (permissible substitution): Payer: Medicare Other | Admitting: Behavioral Health

## 2021-01-11 ENCOUNTER — Telehealth: Payer: Self-pay | Admitting: Behavioral Health

## 2021-01-11 ENCOUNTER — Institutional Professional Consult (permissible substitution): Payer: Medicare Other | Admitting: Behavioral Health

## 2021-01-11 NOTE — Telephone Encounter (Signed)
Lft msg to Pt today re: [facility] telehealth visit. Gave Holiday greetings & related to Pt I will speak w/her after Thx'giving since we could not communicate today.  Dr. Theodis Shove

## 2021-01-17 ENCOUNTER — Other Ambulatory Visit: Payer: Self-pay

## 2021-01-18 ENCOUNTER — Ambulatory Visit (INDEPENDENT_AMBULATORY_CARE_PROVIDER_SITE_OTHER): Payer: Medicare Other | Admitting: Student

## 2021-01-18 VITALS — BP 136/74 | HR 80 | Wt 238.7 lb

## 2021-01-18 DIAGNOSIS — F419 Anxiety disorder, unspecified: Secondary | ICD-10-CM

## 2021-01-18 DIAGNOSIS — E119 Type 2 diabetes mellitus without complications: Secondary | ICD-10-CM | POA: Diagnosis present

## 2021-01-18 DIAGNOSIS — Z0000 Encounter for general adult medical examination without abnormal findings: Secondary | ICD-10-CM

## 2021-01-18 DIAGNOSIS — Z205 Contact with and (suspected) exposure to viral hepatitis: Secondary | ICD-10-CM | POA: Diagnosis not present

## 2021-01-18 DIAGNOSIS — I10 Essential (primary) hypertension: Secondary | ICD-10-CM

## 2021-01-18 DIAGNOSIS — Z23 Encounter for immunization: Secondary | ICD-10-CM | POA: Diagnosis not present

## 2021-01-18 MED ORDER — OZEMPIC (2 MG/DOSE) 8 MG/3ML ~~LOC~~ SOPN: 2.0000 mg | PEN_INJECTOR | SUBCUTANEOUS | 3 refills | Status: DC

## 2021-01-18 MED ORDER — ZOSTER VAC RECOMB ADJUVANTED 50 MCG/0.5ML IM SUSR: 0.5000 mL | Freq: Once | INTRAMUSCULAR | 0 refills | Status: AC

## 2021-01-18 NOTE — Patient Instructions (Addendum)
Thank you, Kristen Frost for allowing [place] to provide your care today. Today we discussed .    Diabetes We will be increasing her Ozempic to 2 mg.  Please continue on this dose, this is the high dose you can receive.  If you notice any side effects please call our clinic to be seen sooner.  Please continue to keep up the good work it is hard to eat healthy during the holidays and in stressful moments but you are doing a great job.  Please also try to continue to attend your exercise classes.  Life stressors You doing a great job staying positive and managing these.  If you feel as though you need further assistance such as medications to help with this, please call us.  Otherwise please follow-up with Dr. Carolynne Edouard.  Healthcare maintenance You will receive your pneumonia vaccine today and then I will give you a prescription for the shingle shot.  Please take this to your local pharmacy to have this done.  I have ordered the following labs for you:  Lab Orders  No laboratory test(s) ordered today     Referrals ordered today:   Referral Orders  No referral(s) requested today     I have ordered the following medication/changed the following medications:   Stop the following medications: There are no discontinued medications.   Start the following medications: No orders of the defined types were placed in this encounter.    Follow up:  1 month  follow up diabetes and weight loss   Should you have any questions or concerns please call the internal medicine clinic at [phone].    Sanjuana Letters, D.O. [place]

## 2021-01-18 NOTE — Assessment & Plan Note (Signed)
Assessment: Continue amlodpine 10 mg daily. BP above goal today, 136/74. Prior visits patient with BP under 130/80. Continue to monitor and if persistently elevated consider ARB.   Plan: -continue amlodipine 10 mg daily

## 2021-01-18 NOTE — Assessment & Plan Note (Addendum)
Assessment: Patient's husband tested positive for hepatitis C antibody last year and was treated for an acute infection per the patient. Since, she has not been tested. We will repeat her Hep C antibody testing today.  Plan: -Repeat Hep C antibody testing  Addendum: Hepatitis C antibody negative

## 2021-01-18 NOTE — Assessment & Plan Note (Signed)
Assessment: Last A1c one month ago, 5.4. Current regimen of Ozempic, metformin discontinued during last visit. She is doing well overall, continue to work on diet and exercise, however, recent life stressors have made this more difficult. She states she "knows what I need to do." She is tolerating ozempic well without side effects. Will continue to up titrate. Repeat A1c in 2 months and assess if need to place patient back on metformin. Will increase ozempic to max dose of 2 mg weekly  Plan: -Increase ozempic to 2mg  weekly -Follow up A1c in 2 months

## 2021-01-18 NOTE — Progress Notes (Signed)
CC: Follow Up - Diabetes, Recent Stressors  HPI:  [name] is a 65 y.o. female with a past medical history stated below and presents today for follow up concerning her diabetes and recent life stressors. Please see problem based assessment and plan for additional details.  Past Medical History:  Diagnosis Date   Anxiety    Arthritis    Bleeding hemorrhoids    grade 2   Diverticulosis of colon    Full dentures    History of colon polyps    Hyperlipidemia    Hypertension    followed by pcp   (04-30-2019  per pt had stress test approx. 2005, was told normal (not available in epic)   IDA (iron deficiency anemia)    OSA on CPAP    followed by dr Halford Chessman ([facility])    (04-30-2019  pt  stated uses every night)   Restless leg syndrome    Type 2 diabetes mellitus ([place])    followed by pcp   (04-30-2019  pt stated does not check blood sugar at home)    Current Outpatient Medications on File Prior to Visit  Medication Sig Dispense Refill   amLODipine (NORVASC) 10 MG tablet TAKE 1 TABLET BY MOUTH DAILY 90 tablet 3   atorvastatin (LIPITOR) 40 MG tablet Take 1 tablet (40 mg total) by mouth daily. 90 tablet 3   cetirizine (ZYRTEC ALLERGY) 10 MG tablet Take 1 tablet (10 mg total) by mouth daily. 90 tablet 3   Cholecalciferol (VITAMIN D) 2000 units CAPS Take 1 capsule (2,000 Units total) by mouth daily. (Patient taking differently: Take 2,000 Units by mouth daily. ) 90 capsule 1   diclofenac Sodium (VOLTAREN) 1 % GEL Apply 4 g topically 4 (four) times daily. 50 g 1   ferrous sulfate 325 (65 FE) MG tablet Take 1 tablet (325 mg total) by mouth daily with breakfast. (Patient taking differently: Take 325 mg by mouth daily with breakfast. ) 90 tablet 1   fluticasone (FLONASE) 50 MCG/ACT nasal spray SHAKE LIQUID AND USE 1 SPRAY IN EACH NOSTRIL DAILY AS NEEDED 16 g 0   Insulin Pen Needle (PEN NEEDLES) 31G X 5 MM MISC 1 application by Does not apply route daily. 90 each 1   No current  facility-administered medications on file prior to visit.    Family History  Problem Relation Age of Onset   Allergies Father    Alcohol abuse Father    Tuberculosis Father        deceased from TB   Allergies Other        sibling   Asthma Other        sibling   Hypertension Other        sibling   Migraines Other        sibling   Rashes / Skin problems Other        sibling   Gout Other        sibling    Social History   Socioeconomic History   Marital status: Married    Spouse name: Not on file   Number of children: Not on file   Years of education: Not on file   Highest education level: Not on file  Occupational History   Occupation: unemployeed  Tobacco Use   Smoking status: Never   Smokeless tobacco: Never  Vaping Use   Vaping Use: Never used  Substance and Sexual Activity   Alcohol use: No   Drug use: Never  Sexual activity: Not on file  Other Topics Concern   Not on file  Social History Narrative   Not on file   Social Determinants of Health   Financial Resource Strain: Not on file  Food Insecurity: Not on file  Transportation Needs: Not on file  Physical Activity: Not on file  Stress: Not on file  Social Connections: Not on file  Intimate Partner Violence: Not on file    Review of Systems: ROS negative except for what is noted on the assessment and plan.  Vitals:   01/18/21 0855  BP: 136/74  Pulse: 80  SpO2: 100%  Weight: 238 lb 11.2 oz (108.3 kg)    Physical Exam: Constitutional: no acute distress HENT: normocephalic atraumatic Eyes: conjunctiva non-erythematous Neck: supple Cardiovascular: regular rate and rhythm, no m/r/g Pulmonary/Chest: normal work of breathing on room air, lungs clear to auscultation bilaterally MSK: normal bulk and tone Neurological: alert & oriented x 3 Skin: warm and dry. Feet without wounds.  Psych: Tearful on examination   Assessment & Plan:   See Encounters Tab for problem based charting.  Patient  discussed with Dr. Delano Metz, D.O. [facility], PGY-2 Pager: [phone], Phone: [phone] Date 01/18/2021 Time 7:01 PM

## 2021-01-18 NOTE — Assessment & Plan Note (Signed)
Patient with many life stressors causing increased anxiety. Patient's husband recently diagnosed with mass of his liver, difficulties with purchasing a new home, and her daughter recently being diagnosed with a COVID infection. She had scheduled meetings with Dr. Theodis Shove unfortunately they have not occurred. Discussed need to follow up with Dr. Theodis Shove, patient states she is still interested. Will reach out to front desk to have this scheduled.

## 2021-01-18 NOTE — Assessment & Plan Note (Signed)
Pneumococcal vaccine given today as well as prescription for shingles shot to take to local pharmacy

## 2021-01-19 ENCOUNTER — Encounter: Payer: Self-pay | Admitting: Student

## 2021-01-19 LAB — HEPATITIS C ANTIBODY: Hep C Virus Ab: 0.1 s/co ratio (ref 0.0–0.9)

## 2021-01-20 NOTE — Progress Notes (Signed)
Internal Medicine Clinic Attending  Case discussed with Dr. [name]  At the time of the visit.  We reviewed the resident's history and exam and pertinent patient test results.  I agree with the assessment, diagnosis, and plan of care documented in the resident's note.  

## 2021-01-31 LAB — HM DIABETES EYE EXAM

## 2021-02-07 ENCOUNTER — Other Ambulatory Visit: Payer: Self-pay | Admitting: Student

## 2021-02-07 ENCOUNTER — Ambulatory Visit: Payer: Medicare Other | Admitting: Behavioral Health

## 2021-02-07 DIAGNOSIS — F419 Anxiety disorder, unspecified: Secondary | ICD-10-CM

## 2021-02-07 DIAGNOSIS — F331 Major depressive disorder, recurrent, moderate: Secondary | ICD-10-CM

## 2021-02-07 DIAGNOSIS — R0981 Nasal congestion: Secondary | ICD-10-CM

## 2021-02-07 NOTE — BH Specialist Note (Signed)
Integrated Behavioral Health via Telemedicine Visit  02/07/2021 Kristen Frost [id number]  Number of [place] visits: 1/6 Session Start time: 9:30am  Session End time: 10:00am Total time: 30  Referring Provider: Dr. Mcpeek Old, DO Patient/Family location: Pt is home in private [facility] Provider location: [facility] Office All persons participating in visit: Pt & Clinician Types of Service: Individual psychotherapy  I connected with Kristen Frost and/or Kristen Frost's  self  via  Telephone or Video Enabled Telemedicine Application  (Video is Caregility application) and verified that I am speaking with the correct person using two identifiers. Discussed confidentiality: Yes   I discussed the limitations of telemedicine and the availability of in person appointments.  Discussed there is a possibility of technology failure and discussed alternative modes of communication if that failure occurs.  I discussed that engaging in this telemedicine visit, they consent to the provision of behavioral healthcare and the services will be billed under their insurance.  Patient and/or legal guardian expressed understanding and consented to Telemedicine visit: Yes   Presenting Concerns: Patient and/or family reports the following symptoms/concerns: life is bringing many concerns for her @ this time. She needs someone to listen to her worries & offer ideas & perspective. Duration of problem: months; Severity of problem: moderate  Patient and/or Family's Strengths/Protective Factors: Social connections, Social and Emotional competence, Concrete supports in place (healthy food, safe environments, etc.), Sense of purpose, and Physical Health (exercise, healthy diet, medication compliance, etc.), Pt is a Jehovah's Witness & does not celebrate Christmas. Pt is clear about her faith, as she was raised Catholic.   Goals Addressed: Patient will:  Reduce symptoms of: anxiety,  depression, and stress   Increase knowledge and/or ability of: coping skills, healthy habits, stress reduction, and alternative perspectives from an objective listener as Pt requests.    Demonstrate ability to: Increase healthy adjustment to current life circumstances  Progress towards Goals: Estb'd today: Pt will attend several sessions to create a trusted space for discussion.  Interventions: Interventions utilized:  Supportive Counseling Standardized Assessments completed:  screeners prn  Patient and/or Family Response: Pt receptive to call today & requests future visits to assist her in coping.  Assessment: Patient currently experiencing several different issues that are bothering her. They are not causing overwhelm, but Pt needs a trusted person to listen.  Patient may benefit from cont'd sessions to create & define Pt needs, inc coping, & for Clinician to listen w/o interruption.   Plan: Follow up with behavioral health clinician on : early Jan for 30 on teleheatlh per Pt request Behavioral recommendations: Keep a Notebook for our first full visit in Jan to guide our work Referral(s): [place] (In Clinic)  I discussed the assessment and treatment plan with the patient and/or parent/guardian. They were provided an opportunity to ask questions and all were answered. They agreed with the plan and demonstrated an understanding of the instructions.   They were advised to call back or seek an in-person evaluation if the symptoms worsen or if the condition fails to improve as anticipated.  Donnetta Hutching, LMFT

## 2021-02-16 ENCOUNTER — Encounter: Payer: Medicare Other | Admitting: Internal Medicine

## 2021-02-22 ENCOUNTER — Telehealth: Payer: Self-pay

## 2021-02-22 NOTE — Telephone Encounter (Signed)
Per pt Semaglutide, 2 MG/DOSE, (OZEMPIC, 2 MG/DOSE,) 8 MG/3ML SOPN is on back order. Requesting to speak with a nurse about taking another medication. Please call pt back.

## 2021-02-23 ENCOUNTER — Encounter: Payer: Self-pay | Admitting: Dietician

## 2021-02-23 ENCOUNTER — Encounter: Payer: Medicare Other | Admitting: Student

## 2021-02-23 ENCOUNTER — Telehealth: Payer: Self-pay | Admitting: *Deleted

## 2021-02-23 NOTE — Telephone Encounter (Signed)
Patient was rescheduled for 02-27-2021 with dr visit.

## 2021-02-27 ENCOUNTER — Ambulatory Visit (INDEPENDENT_AMBULATORY_CARE_PROVIDER_SITE_OTHER): Payer: Medicare Other | Admitting: Student

## 2021-02-27 ENCOUNTER — Other Ambulatory Visit (HOSPITAL_COMMUNITY): Payer: Self-pay

## 2021-02-27 VITALS — BP 137/65 | HR 79 | Temp 98.1°F | Ht 63.0 in | Wt 238.4 lb

## 2021-02-27 DIAGNOSIS — E1122 Type 2 diabetes mellitus with diabetic chronic kidney disease: Secondary | ICD-10-CM | POA: Diagnosis present

## 2021-02-27 DIAGNOSIS — I129 Hypertensive chronic kidney disease with stage 1 through stage 4 chronic kidney disease, or unspecified chronic kidney disease: Secondary | ICD-10-CM | POA: Diagnosis not present

## 2021-02-27 DIAGNOSIS — N182 Chronic kidney disease, stage 2 (mild): Secondary | ICD-10-CM | POA: Diagnosis not present

## 2021-02-27 DIAGNOSIS — Z0000 Encounter for general adult medical examination without abnormal findings: Secondary | ICD-10-CM

## 2021-02-27 DIAGNOSIS — E119 Type 2 diabetes mellitus without complications: Secondary | ICD-10-CM

## 2021-02-27 DIAGNOSIS — I10 Essential (primary) hypertension: Secondary | ICD-10-CM

## 2021-02-27 DIAGNOSIS — Z205 Contact with and (suspected) exposure to viral hepatitis: Secondary | ICD-10-CM | POA: Diagnosis not present

## 2021-02-27 LAB — POCT GLYCOSYLATED HEMOGLOBIN (HGB A1C): Hemoglobin A1C: 5.9 % — AB (ref 4.0–5.6)

## 2021-02-27 LAB — GLUCOSE, CAPILLARY: Glucose-Capillary: 70 mg/dL (ref 70–99)

## 2021-02-27 MED ORDER — OZEMPIC (0.25 OR 0.5 MG/DOSE) 2 MG/1.5ML ~~LOC~~ SOPN
2.4000 mg | PEN_INJECTOR | SUBCUTANEOUS | 3 refills | Status: DC
  Filled 2021-02-27: qty 1.5, 7d supply, fill #0

## 2021-02-27 MED ORDER — WEGOVY 2.4 MG/0.75ML ~~LOC~~ SOAJ
2.4000 mg | SUBCUTANEOUS | 3 refills | Status: DC
  Filled 2021-02-27: qty 3, fill #0

## 2021-02-27 MED ORDER — OZEMPIC (0.25 OR 0.5 MG/DOSE) 2 MG/1.5ML ~~LOC~~ SOPN
2.0000 mg | PEN_INJECTOR | SUBCUTANEOUS | 3 refills | Status: DC
  Filled 2021-02-27: qty 1.5, 7d supply, fill #0

## 2021-02-27 MED ORDER — OZEMPIC (2 MG/DOSE) 8 MG/3ML ~~LOC~~ SOPN
2.0000 mg | PEN_INJECTOR | SUBCUTANEOUS | 3 refills | Status: DC
  Filled 2021-02-27 (×2): qty 3, 28d supply, fill #0

## 2021-02-27 NOTE — Patient Instructions (Addendum)
For your weight loss we will increase your ozempic to 2.4mg  weekly and we will reach out to the nutritionist to give you a phone call. Keep up the good work with your exercise classes!!!  For your blood pressure, please keep a log of daily blood pressures and schedule a telehealth visit in 1 month with these readings.

## 2021-03-01 ENCOUNTER — Other Ambulatory Visit (HOSPITAL_COMMUNITY): Payer: Self-pay

## 2021-03-01 ENCOUNTER — Other Ambulatory Visit: Payer: Self-pay | Admitting: Student

## 2021-03-01 MED ORDER — WEGOVY 2.4 MG/0.75ML ~~LOC~~ SOAJ
2.4000 mg | SUBCUTANEOUS | 3 refills | Status: DC
  Filled 2021-03-01: qty 3, 28d supply, fill #0

## 2021-03-01 NOTE — Assessment & Plan Note (Signed)
Well controlled on ozempic only. Ozempic 2 mg currently. A1c 5.9 this clinic visit. Previously 5.4. Increased ozempic to 2.5mg  to help with weight loss.

## 2021-03-01 NOTE — Progress Notes (Signed)
Internal Medicine Clinic Attending  Case discussed with Dr. Christian  At the time of the visit.  We reviewed the resident's history and exam and pertinent patient test results.  I agree with the assessment, diagnosis, and plan of care documented in the resident's note.  

## 2021-03-01 NOTE — Progress Notes (Signed)
° °  CC: f/u of chronic medical issues   HPI:  [name] is a 66 y.o. F with pmh per below who presents for follow up of her hypertension, weight loss, and diabetes. Please see encounters tab and problem based charting for further details.   Past Medical History:  Diagnosis Date   Anxiety    Arthritis    Bleeding hemorrhoids    grade 2   Diverticulosis of colon    Full dentures    History of colon polyps    Hyperlipidemia    Hypertension    followed by pcp   (04-30-2019  per pt had stress test approx. 2005, was told normal (not available in epic)   IDA (iron deficiency anemia)    OSA on CPAP    followed by dr Halford Chessman ([facility])    (04-30-2019  pt  stated uses every night)   Restless leg syndrome    Type 2 diabetes mellitus ([place])    followed by pcp   (04-30-2019  pt stated does not check blood sugar at home)   Review of Systems:  Please see encounters tab and problem based charting for further details.  Physical Exam:  Vitals:   02/27/21 1008  BP: 137/65  Pulse: 79  Temp: 98.1 F (36.7 C)  TempSrc: Oral  SpO2: 100%  Weight: 238 lb 6.4 oz (108.1 kg)  Height: 5\' 3"  (1.6 m)   Physical Exam  Constitutional: well-developed, well-nourished, and in no distress.  HENT:  Head: Normocephalic and atraumatic.  Eyes: EOM are normal.  Neck: Normal range of motion.  Cardiovascular: Normal rate, regular rhythm, normal heart sounds and intact distal pulses. Exam reveals no gallop and no friction rub.  No murmur heard. Pulmonary/Chest: Non labored breathing on RA, no wheezing or crackles  Abdominal: Soft. Bowel sounds are normal. He exhibits no distension. There is no abdominal tenderness.  Musculoskeletal: Normal range of motion.        General: No tenderness or edema.  Neurological: He is alert and oriented to person, place, and time.  Skin: Skin is warm and dry.     Assessment & Plan:   See Encounters Tab for problem based charting.  Patient discussed with  Dr.  Cain Sieve

## 2021-03-01 NOTE — Assessment & Plan Note (Signed)
Patient not interested in COVID or TDAP vaccine this clinic visit.

## 2021-03-01 NOTE — Assessment & Plan Note (Signed)
Patient's blood pressure 137/65. She is currently on amlodipine 10mg  qd. She denies any lightheadedness, dizziness, chest pain, or SOB. Discussed with patient to take a blood pressure log and continue to work on weight loss. She was given information about dieting.  We will also refer patient to nutrition Increased patient's ozempic to 2.5mg 

## 2021-03-01 NOTE — Assessment & Plan Note (Signed)
Nothing to do this visit. Will continue to monitor.

## 2021-03-02 ENCOUNTER — Ambulatory Visit: Payer: Medicare Other | Admitting: Behavioral Health

## 2021-03-09 ENCOUNTER — Telehealth: Payer: Self-pay

## 2021-03-09 NOTE — Telephone Encounter (Signed)
Pa for  pt ( WEGOVY 2.4 MG/0.75 ML ) came through on cover my meds was submitted along with office notes and labs  from 11/9... awaiting approval or denial

## 2021-03-13 ENCOUNTER — Telehealth: Payer: Self-pay | Admitting: Student

## 2021-03-13 NOTE — Telephone Encounter (Signed)
yes

## 2021-03-13 NOTE — Telephone Encounter (Signed)
Pt requesting a call back about her appetite and the Ozempic medication.

## 2021-03-14 NOTE — Telephone Encounter (Signed)
DECISION:     Message from Plan   Denied.   This drug is a weight loss medication,   which is one of the classes not covered under the Part D coverage. We do not offer supplemental benefit that would cover this drug. Section 1927(d)(2) of the Social Security Act (the Act) permits the exclusion of certain drugs or classes of drugs from coverage under Part D.

## 2021-03-14 NOTE — Telephone Encounter (Addendum)
Called patient at request of triage nurse. She states she is "Outrageously hungry" that has been going on for some time but seems worse since semaglutide medicine increased. Says she is "Starving, eating seconds and still hungry. Craves protein and finds that this satisfies her the most. Has been taking semaglutide 2 mg for a few weeks. She says she exercises at gym, used to go to [employer] for a while, but stopped due to financial reasons. Ramen noodles, chunk chicken, onions, seconds, water. Breakfast- half a pack instant oatmeal, banana.   Lab Results  Component Value Date   HGBA1C 5.9 (A) 02/27/2021   HGBA1C 5.4 12/19/2020   HGBA1C 5.8 (A) 07/19/2020   HGBA1C 5.6 04/08/2020   HGBA1C 6.0 (A) 09/22/2019    Wt Readings from Last 10 Encounters:  02/27/21 238 lb 6.4 oz (108.1 kg)  01/18/21 238 lb 11.2 oz (108.3 kg)  12/19/20 233 lb 14.4 oz (106.1 kg)  07/19/20 223 lb 12.8 oz (101.5 kg)  04/08/20 228 lb (103.4 kg)  01/06/20 231 lb 8 oz (105 kg)  12/21/19 233 lb 6.4 oz (105.9 kg)  11/09/19 236 lb (107 kg)  10/29/19 238 lb 3.2 oz (108 kg)  09/22/19 245 lb 14.4 oz (111.5 kg)   Confirmed telehealth appointment for next week. She agreed to have information mailed to her. Consider checking insulin secretion levels and or Continuous glucose monitoring .

## 2021-03-23 ENCOUNTER — Encounter: Payer: Self-pay | Admitting: Dietician

## 2021-03-23 ENCOUNTER — Other Ambulatory Visit: Payer: Self-pay | Admitting: Student

## 2021-03-23 ENCOUNTER — Telehealth: Payer: Self-pay | Admitting: Dietician

## 2021-03-23 ENCOUNTER — Ambulatory Visit (INDEPENDENT_AMBULATORY_CARE_PROVIDER_SITE_OTHER): Payer: Medicare Other | Admitting: Dietician

## 2021-03-23 DIAGNOSIS — E119 Type 2 diabetes mellitus without complications: Secondary | ICD-10-CM | POA: Diagnosis not present

## 2021-03-23 MED ORDER — SEMAGLUTIDE (2 MG/DOSE) 8 MG/3ML ~~LOC~~ SOPN
2.0000 mg | PEN_INJECTOR | SUBCUTANEOUS | 4 refills | Status: DC
  Filled 2021-03-30: qty 3, 28d supply, fill #0
  Filled 2021-04-20: qty 3, 28d supply, fill #1
  Filled 2021-05-19: qty 3, 28d supply, fill #2

## 2021-03-23 NOTE — Progress Notes (Signed)
I reviewed and approve this note and the plan. Debera Lat, RD 03/23/2021 3:36 PM.

## 2021-03-23 NOTE — Progress Notes (Signed)
Medical Nutrition Therapy Via Telemedicine (Telephone Assisted Platform):  Appt start time: 9:10a.m.  end time: 9:45a.m.  Total time: 65min Visit # 1  This is a telephone encounter between Haskell Riling   and Butch Penny Plyler on 03/23/2021 for Medical Nutrition Therapy. The visit was conducted with the patient located at home and at [facility]. The patient's identity was confirmed using their DOB and current address. The patient has consented to being evaluated through a telephone encounter and understands the associated risks / benefits (allows the patient to remain at home, decreasing exposure to coronavirus). I personally spent 35 minutes on medical nutrition therapy discussion.   The following statements were read to the patient and/or legal guardian that are established with the nutrtional Health Provider.     "The purpose of this phone visit is to provide behavioral health care while limiting exposure to the coronavirus (COVID19).  There is a possibility of technology failure and discussed alternative modes of communication if that failure occurs."     "By engaging in this telephone visit, you consent to the provision of healthcare.  Additionally, you authorize for your insurance to be billed for the services provided during this telephone visit."     Patient and/or legal guardian consented to telephone visit: yes    Assessment:  Primary concerns today: appetite, gotten better  Goals-continue weight loss and patient states she would like to reduce belly fat Get off  medication- prediabetes off metformin, asa; need to assess readiness to get off Ozempic  Received letter not going to pay for 2.4 dosage of semaglutide. On 2 mg currently covered.  Was eating protein,weight watchers.    Preferred Learning Style: Auditory; Visual  Learning Readiness: Ready, Change in progress   ANTHROPOMETRICS:Estimated body mass index is 42.23 kg/m as calculated from the following:    Height as of 02/27/21: 5'  3" (1.6 m).    Weight as of 02/27/21: 238 lb 6.4 oz (108.1 kg).   WEIGHT HISTORY:  Wt Readings from Last 10 Encounters:  02/27/21 238 lb 6.4 oz (108.1 kg)  01/18/21 238 lb 11.2 oz (108.3 kg)  12/19/20 233 lb 14.4 oz (106.1 kg)  07/19/20 223 lb 12.8 oz (101.5 kg)  04/08/20 228 lb (103.4 kg)  01/06/20 231 lb 8 oz (105 kg)  12/21/19 233 lb 6.4 oz (105.9 kg)  11/09/19 236 lb (107 kg)  10/29/19 238 lb 3.2 oz (108 kg)  09/22/19 245 lb 14.4 oz (111.5 kg)   SLEEP: very early on cpap, 830pm- 11pm bedtime, wakes 2-3 bathroom, out of bed 5-7am   MEDICATIONS: ozempic 2.4 mg weekly, psyllium husk daily,  prune juice- stopped because of gas  BLOOD SUGAR:doesn't check  Lab Results  Component Value Date  HGBA1C 5.9 (A) 02/27/2021  HGBA1C 5.4 12/19/2020  HGBA1C 5.8 (A) 07/19/2020  HGBA1C 5.6 04/08/2020  HGBA1C 6.0 (A) 09/22/2019      DIETARY INTAKE:  Usual eating pattern includes 2 meals and 1-2 snacks per day.     Dining Out (times/week): not often; Yesterday-Qdoba mexican bowl ate half, scrambled fried green tomatoes, sauce 1-2x/month  24-hr recall:   sips water before exercise class (Breakfast 1030 AM): leftovers (chicken,rice,vegetable) chicken(curried)peas & rice small portion, boiled egg x2, canned chunk chix, skinny girls salad dressing , water or juice OJ pinepaple- full glass 16 oz, orange  Snk ( PM): sometimes a banana, sometimes little debbie cake, popcorn air popcorn  D ( 530-6 PM): egg noodles, ribs,london broil, always vegetables, two veggies instead of a  starch  Snk ( PM): desires sweet- less than before, atkins snacks- protein meal replacement bar, protein shakes from herbalife  Beverages: water, juice, regular soda, tea ginger tea qam, ginger honey   Usual physical activity: [facility] for  45 minutes ahoy, tia chi, yoga     Estimated energy needs:  5183-3582 calories   130-225g carbohydrates   108.1-130g protein    Progress Towards Goal(s):     Nutritional  Diagnosis: Food and nutrition-related knowledge deficit related to lack of prior sufficient education and support to attain self-confidence as evidenced by self-report of guidance needed regarding nutritional intake.    Intervention:  Nutrition: suggestions regarding intake of protein and fiber-containing foods and supplements.  Action Goal: Complete a food record and maintain physical activity  Outcome goal: Continue nutritional and physical activity habits to reduce medication use.  Coordination of care: investigate insurance coverage of Ozempic 2.4mg   Teaching Method Utilized: Visual, Auditory Handouts given during visit include: Food record for collection:  Demonstrated degree of understanding via:  Teach Back   Monitoring/Evaluation:  Dietary intake, exercise, and body weight   Will follow up in 1 month on 04/20/2021.   Nichole Raynor 03/23/2021 11:26 AM.

## 2021-03-23 NOTE — Telephone Encounter (Signed)
Patient states that her insurance sent her a letter that the weight loss dose of semaglutide is not covered. She request a prescription for the diabetes dose of 2 mg weekly.

## 2021-03-28 ENCOUNTER — Other Ambulatory Visit (HOSPITAL_COMMUNITY): Payer: Self-pay

## 2021-03-30 ENCOUNTER — Other Ambulatory Visit (HOSPITAL_COMMUNITY): Payer: Self-pay

## 2021-03-30 ENCOUNTER — Telehealth: Payer: Self-pay

## 2021-03-30 NOTE — Telephone Encounter (Signed)
Semaglutide, 2 MG/DOSE, 8 MG/3ML SOPN, REFILL REQUEST @ [facility].

## 2021-03-30 NOTE — Telephone Encounter (Signed)
Called to inform pt Kristen Frost rx was sent to [facility] on 03/23/21. She thought it went to [place].

## 2021-03-31 ENCOUNTER — Other Ambulatory Visit (HOSPITAL_COMMUNITY): Payer: Self-pay

## 2021-03-31 ENCOUNTER — Ambulatory Visit (INDEPENDENT_AMBULATORY_CARE_PROVIDER_SITE_OTHER): Payer: Medicare Other | Admitting: Internal Medicine

## 2021-03-31 DIAGNOSIS — E119 Type 2 diabetes mellitus without complications: Secondary | ICD-10-CM | POA: Diagnosis not present

## 2021-03-31 DIAGNOSIS — I10 Essential (primary) hypertension: Secondary | ICD-10-CM | POA: Diagnosis present

## 2021-03-31 NOTE — Progress Notes (Signed)
°  [facility] Internal Medicine Residency Telephone Encounter Continuity Care Appointment  HPI:  This telephone encounter was created for [name] on 03/31/2021 for the following purpose/cc blood pressure.   Past Medical History:  Past Medical History:  Diagnosis Date   Anxiety    Arthritis    Bleeding hemorrhoids    grade 2   Diverticulosis of colon    Full dentures    History of colon polyps    Hyperlipidemia    Hypertension    followed by pcp   (04-30-2019  per pt had stress test approx. 2005, was told normal (not available in epic)   IDA (iron deficiency anemia)    OSA on CPAP    followed by dr Halford Chessman ([place] [facility])    (04-30-2019  pt  stated uses every night)   Restless leg syndrome    Type 2 diabetes mellitus ([place])    followed by pcp   (04-30-2019  pt stated does not check blood sugar at home)     ROS:  Negative except as stated in HPI.    Assessment / Plan / Recommendations:  Please see A&P under problem oriented charting for assessment of the patient's acute and chronic medical conditions.  As always, pt is advised that if symptoms worsen or new symptoms arise, they should go to an urgent care facility or to to ER for further evaluation.   Consent and Medical Decision Making:  Patient discussed with Dr. Angelia Mould This is a telephone encounter between Kristen Frost and Harvie Heck on 03/31/2021 for blood pressure. The visit was conducted with the patient located at home and [place] at [facility]. The patient's identity was confirmed using their DOB and current address. The patient has consented to being evaluated through a telephone encounter and understands the associated risks (an examination cannot be done and the patient may need to come in for an appointment) / benefits (allows the patient to remain at home, decreasing exposure to coronavirus). I personally spent 10 minutes on medical discussion.

## 2021-03-31 NOTE — Assessment & Plan Note (Addendum)
Patient was evaluated on 03/01/2021 for hypertension follow up. She is on amlodipine 10mg  daily. Her BP in office was 137/65. Patient reports medication compliance and reports that her systolic blood pressures at home have ranged in the 130s. She denies any lightheadedness, dizziness, or chest pain or focal weakness.   Plan: Continue current regimen

## 2021-04-02 NOTE — Assessment & Plan Note (Signed)
Patient's ozempic dosing was increased at last visit. Patient reports initially having some trouble obtaining it but was able to get it from the pharmacy. She is tolerating her increased dosing well. Does not report any acute concerns at this time   Plan: Continue ozempic 2.4mg  weekly

## 2021-04-03 NOTE — Progress Notes (Signed)
Internal Medicine Clinic Attending ° °Case discussed with Dr. Aslam  At the time of the visit.  We reviewed the resident’s history and exam and pertinent patient test results.  I agree with the assessment, diagnosis, and plan of care documented in the resident’s note.  °

## 2021-04-14 ENCOUNTER — Other Ambulatory Visit: Payer: Self-pay | Admitting: Internal Medicine

## 2021-04-14 DIAGNOSIS — R0981 Nasal congestion: Secondary | ICD-10-CM

## 2021-04-20 ENCOUNTER — Ambulatory Visit (INDEPENDENT_AMBULATORY_CARE_PROVIDER_SITE_OTHER): Payer: Medicare Other | Admitting: Dietician

## 2021-04-20 ENCOUNTER — Other Ambulatory Visit (HOSPITAL_COMMUNITY): Payer: Self-pay

## 2021-04-20 DIAGNOSIS — E119 Type 2 diabetes mellitus without complications: Secondary | ICD-10-CM | POA: Diagnosis not present

## 2021-04-20 NOTE — Progress Notes (Signed)
Medical Nutrition Therapy Via Telemedicine (Telephone Assisted Platform):  Appt start time: 10:16a.m.  end time: 1043 a.m.  ?Total time:27 min ?Visit # 2 ? ?This is a telephone encounter between Kristen Frost  and Kristen Frost on 04/20/2021 for Medical Nutrition Therapy. The visit was conducted with the patient located at home and at [place]. The patient's identity was confirmed using their DOB and current address. The patient has consented to being evaluated through a telephone encounter and understands the associated risks / benefits (allows the patient to remain at home, decreasing exposure to coronavirus). I personally spent 35 minutes on medical nutrition therapy discussion.  ? ?The following statements were read to the patient and/or legal guardian that are established with the nutrtional Health Provider.  ?   ?"The purpose of this phone visit is to provide behavioral health care while limiting exposure to the coronavirus (COVID19).  There is a possibility of technology failure and discussed alternative modes of communication if that failure occurs."  ?   ?"By engaging in this telephone visit, you consent to the provision of healthcare.  Additionally, you authorize for your insurance to be billed for the services provided during this telephone visit."  ?   ?Patient and/or legal guardian consented to telephone visit: yes  ? ? ?Assessment:  Primary concerns today: appetite- lately better. She does not weigh at hime, she did not keep food records due to competing values.  ?Goals-continue weight loss and patient states she would like to reduce belly fat ?Get off  medication for weight loss.  ?Was eating protein,weight watchers. Points 23/day - not using now ? ?Not weighing at home- at doctor's office, stomach feels the same, does not want surgery ? ?ANTHROPOMETRICS:Estimated body mass index is 42.23 kg/m? as calculated from the following:  ?  Height as of 02/27/21: 5\' 3"  (1.6 m).  ?  Weight as of 02/27/21: 238 lb 6.4  oz (108.1 kg).  ? ? ?SLEEP: very early on cpap, 630 pm- her comfort spot, out of bed 5-7am - "I sleep well" ? ?Exercise class 3x/week- 25 minutes each time , counts minutes and steps  ? ?MEDICATIONS: ozempic 2.0 mg weekly- tolerating well, psyllium husk daily    ? ?DIETARY INTAKE:  ?Usual eating pattern includes 2 meals and 1-2 snacks per day. She did nopt keep nay food records due to competing values.  ?    ?24-hr recall:  ?630 am Eggs x1, ham steak palm of her hand, spoonful of grits, bread, 1 slice water with drink mix ?12-1 Captain D lobster salad roll, high C fruit punch ?6 pm- small bowl of spaghetti with ground beef, no cheese, seltzer water ?Fruit- 3-4 per week ?Veggies-daily ?Whole grains- ?Nuts, seeds, beans-  ?Beverages- ginger ale, water ? Was not feeling well- legs hurt/ arthritis ? ?Estimated energy needs:  ?0867-6195 calories  ? 130-225g carbohydrates  ? 108.1-130g protein  ? ? ?Progress Towards Goal(s):   in progress ? ?Nutritional Diagnosis: Food and nutrition-related knowledge deficit related to lack of prior sufficient education and support to attain self-confidence as evidenced by self-report of guidance needed regarding nutritional intake.  ? ? ?Intervention:  Nutrition education about habits that often lead to successful weight loss. Increased physical activity, sell monitoring via weight or food records, increasing whole foods in her diet.  ?Action Goal: Complete a food record and maintain physical activity  ?Outcome goal: Continue nutritional and physical activity habits to reduce medication use.  ?Coordination of care:  ? ?Teaching Method Utilized:  Visual, Auditory ?Handouts given during visit include: Food record for collection:  ?Demonstrated degree of understanding via:  Teach Back  ? ?Monitoring/Evaluation:  Dietary intake, exercise, and body weight  ? ?Will follow up in ~1 month   ? ?Debera Lat, RD ?04/20/2021 ?10:15 AM. ? ?

## 2021-04-20 NOTE — Patient Instructions (Signed)
Exercising to Lose Weight ?Getting regular exercise is important for everyone. It is especially important if you are overweight. Being overweight increases your risk of heart disease, stroke, diabetes, high blood pressure, and several types of cancer. Exercising, and reducing the calories you consume, can help you lose weight and improve fitness and health. ?Exercise can be moderate or vigorous intensity. To lose weight, most people need to do a certain amount of moderate or vigorous-intensity exercise each week. ?How can exercise affect me? ?You lose weight when you exercise enough to burn more calories than you eat. Exercise also reduces body fat and builds muscle. The more muscle you have, the more calories you burn. Exercise also: ?Improves mood. ?Reduces stress and tension. ?Improves your overall fitness, flexibility, and endurance. ?Increases bone strength. ?Moderate-intensity exercise ?Moderate-intensity exercise is any activity that gets you moving enough to burn at least three times more energy (calories) than if you were sitting. ?Examples of moderate exercise include: ?Walking a mile in 15 minutes. ?Doing light yard work. ?Biking at an easy pace. ?Most people should get at least 150 minutes of moderate-intensity exercise a week to maintain their body weight. ?Vigorous-intensity exercise ?Vigorous-intensity exercise is any activity that gets you moving enough to burn at least six times more calories than if you were sitting. When you exercise at this intensity, you should be working hard enough that you are not able to carry on a conversation. ?Examples of vigorous exercise include: ?Running. ?Playing a team sport, such as football, basketball, and soccer. ?Jumping rope. ?Most people should get at least 75 minutes a week of vigorous exercise to maintain their body weight. ?What actions can I take to lose weight? ?The amount of exercise you need to lose weight depends on: ?Your age. ?The type of  exercise. ?Any health conditions you have. ?Your overall physical ability. ?Talk to your health care provider about how much exercise you need and what types of activities are safe for you. ?Nutrition ? ?Make changes to your diet as told by your health care provider or diet and nutrition specialist (dietitian). This may include: ?Eating fewer calories. ?Eating more protein. ?Eating less unhealthy fats. ?Eating a diet that includes fresh fruits and vegetables, whole grains, low-fat dairy products, and lean protein. ?Avoiding foods with added fat, salt, and sugar. ?Drink plenty of water while you exercise to prevent dehydration or heat stroke. ?Activity ?Choose an activity that you enjoy and set realistic goals. Your health care provider can help you make an exercise plan that works for you. ?Exercise at a moderate or vigorous intensity most days of the week. ?The intensity of exercise may vary from person to person. You can tell how intense a workout is for you by paying attention to your breathing and heartbeat. Most people will notice their breathing and heartbeat get faster with more intense exercise. ?Do resistance training twice each week, such as: ?Push-ups. ?Sit-ups. ?Lifting weights. ?Using resistance bands. ?Getting short amounts of exercise can be just as helpful as long, structured periods of exercise. If you have trouble finding time to exercise, try doing these things as part of your daily routine: ?Get up, stretch, and walk around every 30 minutes throughout the day. ?Go for a walk during your lunch break. ?Park your car farther away from your destination. ?If you take public transportation, get off one stop early and walk the rest of the way. ?Make phone calls while standing up and walking around. ?Take the stairs instead of elevators or escalators. ?Wear  comfortable clothes and shoes with good support. ?Do not exercise so much that you hurt yourself, feel dizzy, or get very short of breath. ?Where to  find more information ?[place] Department of Health and Human Services: BondedCompany.at ?Centers for Disease Control and Prevention: http://www.wolf.info/ ?Contact a health care provider: ?Before starting a new exercise program. ?If you have questions or concerns about your weight. ?If you have a medical problem that keeps you from exercising. ?Get help right away if: ?You have any of the following while exercising: ?Injury. ?Dizziness. ?Difficulty breathing or shortness of breath that does not go away when you stop exercising. ?Chest pain. ?Rapid heartbeat. ?These symptoms may represent a serious problem that is an emergency. Do not wait to see if the symptoms will go away. Get medical help right away. Call your local emergency services (911 in the [place] Do not drive yourself to the hospital. ?Summary ?Getting regular exercise is especially important if you are overweight. ?Being overweight increases your risk of heart disease, stroke, diabetes, high blood pressure, and several types of cancer. ?Losing weight happens when you burn more calories than you eat. ?Reducing the amount of calories you eat, and getting regular moderate or vigorous exercise each week, helps you lose weight. ? ?_ ? ? ?

## 2021-05-15 ENCOUNTER — Other Ambulatory Visit: Payer: Self-pay

## 2021-05-15 DIAGNOSIS — E785 Hyperlipidemia, unspecified: Secondary | ICD-10-CM

## 2021-05-15 MED ORDER — ATORVASTATIN CALCIUM 40 MG PO TABS: 40.0000 mg | ORAL_TABLET | Freq: Every day | ORAL | 3 refills | Status: DC

## 2021-05-18 ENCOUNTER — Ambulatory Visit: Payer: Medicare Other | Admitting: Dietician

## 2021-05-19 ENCOUNTER — Other Ambulatory Visit (HOSPITAL_COMMUNITY): Payer: Self-pay

## 2021-05-30 ENCOUNTER — Ambulatory Visit
Admission: RE | Admit: 2021-05-30 | Discharge: 2021-05-30 | Disposition: A | Discharge status: Home / Self Care | Payer: Medicare Other | Source: Ambulatory Visit | Attending: Internal Medicine | Admitting: Internal Medicine

## 2021-06-12 ENCOUNTER — Other Ambulatory Visit: Payer: Self-pay | Admitting: Student

## 2021-06-12 DIAGNOSIS — R0981 Nasal congestion: Secondary | ICD-10-CM

## 2021-06-16 ENCOUNTER — Other Ambulatory Visit: Payer: Self-pay | Admitting: Student

## 2021-06-16 ENCOUNTER — Other Ambulatory Visit (HOSPITAL_COMMUNITY): Payer: Self-pay

## 2021-06-17 MED ORDER — OZEMPIC (2 MG/DOSE) 8 MG/3ML ~~LOC~~ SOPN
2.0000 mg | PEN_INJECTOR | SUBCUTANEOUS | 4 refills | Status: DC
  Filled 2021-06-17: qty 3, 28d supply, fill #0

## 2021-06-19 ENCOUNTER — Other Ambulatory Visit (HOSPITAL_COMMUNITY): Payer: Self-pay

## 2021-06-28 ENCOUNTER — Other Ambulatory Visit (HOSPITAL_COMMUNITY): Payer: Self-pay

## 2021-06-28 ENCOUNTER — Ambulatory Visit (INDEPENDENT_AMBULATORY_CARE_PROVIDER_SITE_OTHER): Payer: Medicare Other | Admitting: Student

## 2021-06-28 VITALS — BP 129/88 | HR 79 | Wt 232.4 lb

## 2021-06-28 DIAGNOSIS — I129 Hypertensive chronic kidney disease with stage 1 through stage 4 chronic kidney disease, or unspecified chronic kidney disease: Secondary | ICD-10-CM | POA: Diagnosis not present

## 2021-06-28 DIAGNOSIS — E785 Hyperlipidemia, unspecified: Secondary | ICD-10-CM | POA: Diagnosis not present

## 2021-06-28 DIAGNOSIS — E1122 Type 2 diabetes mellitus with diabetic chronic kidney disease: Secondary | ICD-10-CM | POA: Diagnosis present

## 2021-06-28 DIAGNOSIS — M7918 Myalgia, other site: Secondary | ICD-10-CM

## 2021-06-28 DIAGNOSIS — E119 Type 2 diabetes mellitus without complications: Secondary | ICD-10-CM | POA: Diagnosis present

## 2021-06-28 DIAGNOSIS — I10 Essential (primary) hypertension: Secondary | ICD-10-CM

## 2021-06-28 DIAGNOSIS — N182 Chronic kidney disease, stage 2 (mild): Secondary | ICD-10-CM

## 2021-06-28 LAB — POCT GLYCOSYLATED HEMOGLOBIN (HGB A1C): Hemoglobin A1C: 5.8 % — AB (ref 4.0–5.6)

## 2021-06-28 LAB — GLUCOSE, CAPILLARY: Glucose-Capillary: 123 mg/dL — ABNORMAL HIGH (ref 70–99)

## 2021-06-28 MED ORDER — MOUNJARO 2.5 MG/0.5ML ~~LOC~~ SOAJ
2.5000 mg | SUBCUTANEOUS | 2 refills | Status: DC
  Filled 2021-06-28: qty 2, 28d supply, fill #0
  Filled 2021-06-30: qty 2.5, 35d supply, fill #0
  Filled 2021-07-04: qty 2, 28d supply, fill #0
  Filled 2021-07-28 – 2021-08-04 (×7): qty 2, 28d supply, fill #1

## 2021-06-28 NOTE — Patient Instructions (Addendum)
It was a pleasure seeing you in clinic. Today we discussed:  ? ?Diabetes and weight ?Your A1c looks great today! ?Please stop ozempic and start mounjaro 2.5 mg weekly, you can use a copay card from the [employer] if insurance does not cover this ?Please continue to diet modifications to help with weight loss ? ?Buttock pain ?I have printed off some instruction to help with this, if not improving we can also send you to physical therapy ?Continue to use voltaren or bengay as needed to help with pain ? ?We will check lab today ? ?Follow up in 1-2  month to see how you are doing on mounjaro  ? ?If you have any questions or concerns, please call our clinic at [phone] between 9am-5pm and after hours call [phone] and ask for the internal medicine resident on call. If you feel you are having a medical emergency please call 911.  ? ?Thank you, we look forward to helping you remain healthy! ? ?

## 2021-06-29 LAB — BMP8+ANION GAP
Anion Gap: 13 mmol/L (ref 10.0–18.0)
BUN/Creatinine Ratio: 13 (ref 12–28)
BUN: 15 mg/dL (ref 8–27)
CO2: 24 mmol/L (ref 20–29)
Calcium: 9.7 mg/dL (ref 8.7–10.3)
Chloride: 105 mmol/L (ref 96–106)
Creatinine, Ser: 1.17 mg/dL — ABNORMAL HIGH (ref 0.57–1.00)
Glucose: 73 mg/dL (ref 70–99)
Potassium: 4.2 mmol/L (ref 3.5–5.2)
Sodium: 142 mmol/L (ref 134–144)
eGFR: 52 mL/min/{1.73_m2} — ABNORMAL LOW (ref >59)

## 2021-06-29 LAB — LIPID PANEL
Chol/HDL Ratio: 3.6 ratio (ref 0.0–4.4)
Cholesterol, Total: 200 mg/dL — ABNORMAL HIGH (ref 100–199)
HDL: 55 mg/dL (ref >39)
LDL Chol Calc (NIH): 128 mg/dL — ABNORMAL HIGH (ref 0–99)
Triglycerides: 93 mg/dL (ref 0–149)
VLDL Cholesterol Cal: 17 mg/dL (ref 5–40)

## 2021-06-29 LAB — MICROALBUMIN / CREATININE URINE RATIO
Creatinine, Urine: 118.7 mg/dL
Microalb/Creat Ratio: 8 mg/g creat (ref 0–29)
Microalbumin, Urine: 9.1 ug/mL

## 2021-06-29 NOTE — Assessment & Plan Note (Addendum)
BMP checked today with creatinine of 1.17 and BUN of 52 seems close to baseline when compared to other measurements in the last few years.  Medications reviewed , she does not take NSAIDs regularly.  We will recheck at next visit, if this remains elevated may represent progression of CKD. ?

## 2021-06-29 NOTE — Assessment & Plan Note (Addendum)
Lipid Panel  ?   ?Component Value Date/Time  ? CHOL 200 (H) 06/28/2021 0934  ? TRIG 93 06/28/2021 0934  ? HDL 55 06/28/2021 0934  ? CHOLHDL 3.6 06/28/2021 0934  ? [place] 128 (H) 06/28/2021 0934  ? LABVLDL 17 06/28/2021 0934  ? ?Lipid panel rechecked today with elevation in LDL to 128 compared to 80.  ASCVD 20%. she reports compliance with atorvastatin 40 mg daily.  States she missed 2 doses in the last month, no issues taking this regularly.  Denies any changes recently that might of contributed to this.  She would like to discuss this further at her next visit before starting on any medications. ? ?We will discuss this further at next visit, may benefit from further lipid-lowering agents including Zetia and PSK 9 inhibitors ?

## 2021-06-29 NOTE — Assessment & Plan Note (Signed)
A1c today is 5.8%.  She currently takes Ozempic 2 mg weekly.  Trams declined increased to 2.4 mg weekly given her well-controlled diabetes.  She with like to lose more weight and has not noticed much weight loss on the Ozempic.  We will switch her to monitor which may have more weight loss benefit and titrate her from 2.5 mg weekly upwards. ? ?Stop Ozempic ?Start 1 to 2.5 mg weekly ?Urine microalbumin checked today was normal ?Follow-up in 1 month ?

## 2021-06-29 NOTE — Assessment & Plan Note (Signed)
She still has pain in her left buttock consistent with piriformis pain.  Has point tenderness to the upper central area of her left buttock.  Describes pain as a soreness this is worse sometimes when doing exercises at the gym.  She has been applying Voltaren as well as heat and ice with some improvement however it does bother her when she is trying to lay on her left side to sleep.  We discussed stretches to help with her pain.  She will try these daily to see if this improves her pain.  If not we can also try referral to PT at her next visit ?

## 2021-06-29 NOTE — Assessment & Plan Note (Addendum)
Reports insurance would not approve increasing dose of Ozempic to 2.'4mg'$ .  Currently is still using 2 mg weekly.  Weight down approximately 6 pounds since last visit in January.  Given lack of much weight improvement on Ozempic we will switch her to [facility] 2.5 mg weekly and titrate in 1 month if she is tolerating this well. ?

## 2021-06-29 NOTE — Assessment & Plan Note (Signed)
BP 129/88 well-controlled on current medications.  We will continue on her on amlodipine 10 mg daily. ?

## 2021-06-29 NOTE — Progress Notes (Signed)
? ?Established Patient Office Visit ? ?Subjective   ?Patient ID: Kristen Frost, female    DOB: 02-22-1955  Age: 66 y.o. MRN: [id number] ? ?Chief Complaint  ?Patient presents with  ? Diabetes  ?  Follow up ?  ? ? ?Kristen Frost presents today for follow up of diabetes and left buttock pain. Please refer to problem based charting for further details and assessment and plan of current problem and chronic medical conditions. ? ? ? ?Patient Active Problem List  ? Diagnosis Date Noted  ? Exposure to hepatitis C 01/18/2021  ? Right knee pain 12/19/2020  ? Anxiety 12/19/2020  ? Piriformis muscle pain 07/19/2020  ? Seasonal allergies 07/19/2020  ? Hearing difficulty of right ear 04/08/2020  ? Left shoulder pain 10/29/2019  ? Acute back pain 09/22/2019  ? CKD (chronic kidney disease) stage 2, GFR 60-89 ml/min 08/09/2016  ? Healthcare maintenance 05/17/2016  ? Vitamin D deficiency 03/09/2016  ? Dyslipidemia 03/02/2016  ? Class 3 obesity ([place]) 03/01/2016  ? T2DM (type 2 diabetes mellitus) ([place]) 03/01/2016  ? HTN (hypertension) 11/17/2015  ? OSA (obstructive sleep apnea) 12/04/2011  ? RLS (restless legs syndrome) 12/04/2011  ? ?  ? ?Review of Systems  ?All other systems reviewed and are negative. ? ?  ?Objective:  ?  ? ?BP 129/88 (BP Location: Left Arm, Patient Position: Sitting, Cuff Size: Normal)   Pulse 79   Wt 232 lb 6.4 oz (105.4 kg)   SpO2 99%   BMI 41.17 kg/m?  ?  ? ?Physical Exam ?Constitutional:   ?   Appearance: Normal appearance.  ?Cardiovascular:  ?   Rate and Rhythm: Normal rate and regular rhythm.  ?   Pulses: Normal pulses.  ?Pulmonary:  ?   Effort: Pulmonary effort is normal.  ?   Breath sounds: Normal breath sounds.  ?Abdominal:  ?   General: Abdomen is flat. Bowel sounds are normal.  ?   Palpations: Abdomen is soft.  ?Musculoskeletal:  ?   Comments: Pain with palpation of the left central upper buttock, no hip pain, no knee pain  ?Skin: ?   Comments: Rashes or obvious lesions on bilateral feet, DP and  PT pulses 2+ bilaterally  ?Neurological:  ?   General: No focal deficit present.  ?   Mental Status: She is alert and oriented to person, place, and time.  ? ? ?Results for orders placed or performed in visit on 06/28/21  ?Microalbumin / Creatinine Urine Ratio  ?Result Value Ref Range  ? Creatinine, Urine 118.7 Not Estab. mg/dL  ? Microalbumin, Urine 9.1 Not Estab. ug/mL  ? Microalb/Creat Ratio 8 0 - 29 mg/g creat  ?Glucose, capillary  ?Result Value Ref Range  ? Glucose-Capillary 123 (H) 70 - 99 mg/dL  ?BMP8+Anion Gap  ?Result Value Ref Range  ? Glucose 73 70 - 99 mg/dL  ? BUN 15 8 - 27 mg/dL  ? Creatinine, Ser 1.17 (H) 0.57 - 1.00 mg/dL  ? eGFR 52 (L) >59 mL/min/1.73  ? BUN/Creatinine Ratio 13 12 - 28  ? Sodium 142 134 - 144 mmol/L  ? Potassium 4.2 3.5 - 5.2 mmol/L  ? Chloride 105 96 - 106 mmol/L  ? CO2 24 20 - 29 mmol/L  ? Anion Gap 13.0 10.0 - 18.0 mmol/L  ? Calcium 9.7 8.7 - 10.3 mg/dL  ?Lipid Profile  ?Result Value Ref Range  ? Cholesterol, Total 200 (H) 100 - 199 mg/dL  ? Triglycerides 93 0 - 149 mg/dL  ? HDL 55 >  39 mg/dL  ? VLDL Cholesterol Cal 17 5 - 40 mg/dL  ? LDL Chol Calc (NIH) 128 (H) 0 - 99 mg/dL  ? Chol/HDL Ratio 3.6 0.0 - 4.4 ratio  ?POC Hbg A1C  ?Result Value Ref Range  ? Hemoglobin A1C 5.8 (A) 4.0 - 5.6 %  ? HbA1c POC (<> result, manual entry)    ? HbA1c, POC (prediabetic range)    ? HbA1c, POC (controlled diabetic range)    ? ? ?Last metabolic panel ?Lab Results  ?Component Value Date  ? GLUCOSE 73 06/28/2021  ? NA 142 06/28/2021  ? K 4.2 06/28/2021  ? CL 105 06/28/2021  ? CO2 24 06/28/2021  ? BUN 15 06/28/2021  ? CREATININE 1.17 (H) 06/28/2021  ? EGFR 52 (L) 06/28/2021  ? CALCIUM 9.7 06/28/2021  ? PROT 7.3 12/19/2020  ? ALBUMIN 4.4 12/19/2020  ? LABGLOB 2.9 12/19/2020  ? AGRATIO 1.5 12/19/2020  ? BILITOT 0.5 12/19/2020  ? ALKPHOS 123 (H) 12/19/2020  ? AST 25 12/19/2020  ? ALT 17 12/19/2020  ? ANIONGAP 6 09/15/2019  ? ? ? ?The 10-year ASCVD risk score (Arnett DK, et al., 2019) is: 20.9% ? ?   ?Assessment & Plan:  ? ?Problem List Items Addressed This Visit   ? ?  ? Cardiovascular and Mediastinum  ? HTN (hypertension)  ?  BP 129/88 well-controlled on current medications.  We will continue on her on amlodipine 10 mg daily. ? ?  ?  ?  ? Endocrine  ? T2DM (type 2 diabetes mellitus) ([place]) - Primary  ?  A1c today is 5.8%.  She currently takes Ozempic 2 mg weekly.  Trams declined increased to 2.4 mg weekly given her well-controlled diabetes.  She with like to lose more weight and has not noticed much weight loss on the Ozempic.  We will switch her to monitor which may have more weight loss benefit and titrate her from 2.5 mg weekly upwards. ? ?Stop Ozempic ?Start 1 to 2.5 mg weekly ?Urine microalbumin checked today was normal ?Follow-up in 1 month ? ?  ?  ? Relevant Medications  ? tirzepatide [facility]) 2.5 MG/0.5ML Pen  ? Other Relevant Orders  ? POC Hbg A1C (Completed)  ? Microalbumin / Creatinine Urine Ratio (Completed)  ?  ? Genitourinary  ? CKD (chronic kidney disease) stage 2, GFR 60-89 ml/min  ?  BMP checked today with creatinine of 1.7 and BUN of 52 seems close to baseline when compared to other measurements in the last few years.  Medications reviewed skin warm, no changes that would increase any function.  She does not take NSAIDs regularly.  We will recheck at next visit, if this remains elevated may represent progression of CKD. ? ?  ?  ? Relevant Orders  ? BMP8+Anion Gap (Completed)  ?  ? Other  ? Class 3 obesity (HCC)  ?  Reports insurance would not approve for titration in her Ozempic.  Currently is still on 2 mg weekly.  Weight down approximately 6 pounds since last visit in January.  Given lack of much weight improvement on Ozempic we will switch her to [facility] 2.5 mg weekly and titrate in 1 month if she is tolerating this well. ? ?  ?  ? Dyslipidemia  ? Relevant Orders  ? Lipid Profile (Completed)  ? Piriformis muscle pain  ?  She still has pain in her left buttock consistent with piriformis  pain.  Has point tenderness to the upper central area of her left  buttock.  Describes pain as a soreness this is worse sometimes when doing exercises at the gym.  She has been applying Voltaren as well as heat and ice with some improvement however it does bother her when she is trying to lay on her left side to sleep.  We discussed stretches to help with her pain.  She will try these daily to see if this improves her pain.  If not we can also try referral to PT at her next visit ? ?  ?  ? ? ?Return in about 2 months (around 08/28/2021).  ? ? ?Iona Beard, MD ? ?Discussed with Dr. Saverio Danker.  ?

## 2021-06-30 ENCOUNTER — Other Ambulatory Visit (HOSPITAL_COMMUNITY): Payer: Self-pay

## 2021-06-30 ENCOUNTER — Telehealth: Payer: Self-pay | Admitting: *Deleted

## 2021-06-30 ENCOUNTER — Encounter: Payer: Self-pay | Admitting: Pulmonary Disease

## 2021-06-30 ENCOUNTER — Ambulatory Visit (INDEPENDENT_AMBULATORY_CARE_PROVIDER_SITE_OTHER): Payer: Medicare Other | Admitting: Pulmonary Disease

## 2021-06-30 VITALS — BP 138/84 | HR 78 | Temp 97.9°F | Ht 61.5 in | Wt 237.2 lb

## 2021-06-30 DIAGNOSIS — G4730 Sleep apnea, unspecified: Secondary | ICD-10-CM

## 2021-06-30 DIAGNOSIS — Z9989 Dependence on other enabling machines and devices: Secondary | ICD-10-CM | POA: Diagnosis not present

## 2021-06-30 DIAGNOSIS — E669 Obesity, unspecified: Secondary | ICD-10-CM | POA: Diagnosis not present

## 2021-06-30 DIAGNOSIS — G4733 Obstructive sleep apnea (adult) (pediatric): Secondary | ICD-10-CM

## 2021-06-30 NOTE — Patient Instructions (Signed)
Follow up in 1 year.

## 2021-06-30 NOTE — Telephone Encounter (Signed)
Patient called in stating there is something wrong on Rx for [facility]. Spoke with Manuela Schwartz at [facility]. States it is requiring a PA and PA request was faxed to [facility] on 5/10. Will forward to PA staff. ?

## 2021-06-30 NOTE — Progress Notes (Signed)
? ?[facility] ? ?Chief Complaint  ?Patient presents with  ? Follow-up  ?  No c/o   ? ? ?Constitutional:  ?BP 138/84 (BP Location: Left Arm, Patient Position: Sitting, Cuff Size: Large)   Pulse 78   Temp 97.9 ?F (36.6 ?C) (Oral)   Ht 5' 1.5" (1.562 m)   Wt 237 lb 3.2 oz (107.6 kg)   SpO2 96%   BMI 44.09 kg/m?  ? ?Past Medical History:  ?Arthritis, RLS, Anemia, Cholelithiasis, HTN, DM ? ?Past Surgical History:  ?Her  has a past surgical history that includes Rotator cuff repair (Left, 2007   approx.); Cholecystectomy (N/A, 06/07/2016); Colonoscopy with propofol (last one 04-10-2018    dr Loletha Carrow); and Transanal hemorrhoidal dearterialization (N/A, 05/07/2019). ? ?Brief Summary:  ?Kristen Frost is a 66 y.o. female with obstructive sleep apnea. ?  ? ? ? ?Subjective:  ? ?She had more trouble with additional weight loss.  Has been under a lot of stress (her husband was diagnosed with liver cancer).  She was recently prescribed mounjaro, but hasn't started this yet. ? ?No issues with CPAP.  Has full face mask.  No sinus congestion or dry mouth. ? ?Physical Exam:  ? ?Appearance - well kempt  ? ?ENMT - no sinus tenderness, no oral exudate, no LAN, Mallampati 2 airway, no stridor, wears dentures ? ?Respiratory - equal breath sounds bilaterally, no wheezing or rales ? ?CV - s1s2 regular rate and rhythm, no murmurs ? ?Ext - no clubbing, no edema ? ?Skin - no rashes ? ?Psych - normal mood and affect ? ?  ?Sleep Tests:  ?PSG 08/20/09 >> AHI 26 ?PSG 04/25/16 >> AHI 16.4, SpO2 low 88%.  CPAP 9 cm H2O ?Auto CPAP 05/30/21 to 06/28/21 >> used on 20 of 30 nights with average 6 hrs 53 min.  Average AHI 0.5 with median CPAP 8 and 95 th percentile CPAP 10 cm H2O ? ?Social History:  ?She  reports that she has never smoked. She has never used smokeless tobacco. She reports that she does not drink alcohol and does not use drugs. ? ?Family History:  ?Her family history includes Alcohol abuse in  her father; Allergies in her father and another family member; Asthma in an other family member; Gout in an other family member; Hypertension in an other family member; Migraines in an other family member; Rashes / Skin problems in an other family member; Tuberculosis in her father. ?  ? ? ?Assessment/Plan:  ? ?Obstructive sleep apnea. ?- she is compliant with CPAP and reports benefit ?- uses [employer] for her DME ?- continue auto CPAP 5 to 15 cm H2O ?  ?Obesity. ?- she recently was prescribed mounjaro ? ?Time Spent Involved in Patient Care on Day of Examination:  ?27 minutes ? ?Follow up:  ? ?Patient Instructions  ?Follow up in 1 year ? ?Medication List:  ? ?Allergies as of 06/30/2021   ?No Known Allergies ?  ? ?  ?Medication List  ?  ? ?  ? Accurate as of Jun 30, 2021  2:36 PM. If you have any questions, ask your nurse or doctor.  ?  ?  ? ?  ? ?amLODipine 10 MG tablet ?Commonly known as: NORVASC ?TAKE 1 TABLET BY MOUTH DAILY ?  ?atorvastatin 40 MG tablet ?Commonly known as: Lipitor ?Take 1 tablet (40 mg total) by mouth daily. ?  ?cetirizine 10 MG tablet ?Commonly known as: ZyrTEC Allergy ?Take 1 tablet (10 mg total) by  mouth daily. ?  ?diclofenac Sodium 1 % Gel ?Commonly known as: VOLTAREN ?Apply 4 g topically 4 (four) times daily. ?  ?ferrous sulfate 325 (65 FE) MG tablet ?Take 1 tablet (325 mg total) by mouth daily with breakfast. ?  ?fluticasone 50 MCG/ACT nasal spray ?Commonly known as: FLONASE ?SHAKE LIQUID AND USE 1 SPRAY IN EACH NOSTRIL DAILY AS NEEDED ?  ?Mounjaro 2.5 MG/0.5ML Pen ?Generic drug: tirzepatide ?Inject 2.5 mg into the skin once a week. ?  ?Pen Needles 31G X 5 MM Misc ?1 application by Does not apply route daily. ?  ?Vitamin D 50 MCG (2000 UT) Caps ?Take 1 capsule (2,000 Units total) by mouth daily. ?  ? ?  ? ? ?Signature:  ?Chesley Mires, MD ?[place] ?Pager - [phone] ?06/30/2021, 2:36 PM ?  ? ? ? ? ? ? ? ? ?

## 2021-07-03 ENCOUNTER — Telehealth: Payer: Self-pay

## 2021-07-03 NOTE — Addendum Note (Signed)
Addended by: Charise Killian on: 07/03/2021 12:26 PM ? ? Modules accepted: Level of Service ? ?

## 2021-07-03 NOTE — Progress Notes (Signed)
Internal Medicine Clinic Attending ? ?Case discussed with Dr. Liang  At the time of the visit.  We reviewed the resident?s history and exam and pertinent patient test results.  I agree with the assessment, diagnosis, and plan of care documented in the resident?s note. ? ?

## 2021-07-03 NOTE — Telephone Encounter (Signed)
Pa for pt [facility] )  came through on cover my meds was submitted with office notes  and last labs .. awaiting approval or denial ?

## 2021-07-04 ENCOUNTER — Other Ambulatory Visit (HOSPITAL_COMMUNITY): Payer: Self-pay

## 2021-07-04 NOTE — Telephone Encounter (Signed)
DECISION: ? ? ? ? ?Message from Plan ? ?Approved.  ? ? ?This drug has been approved under the Member's Medicare Part D benefit. Approved quantity: 2.5 ML per 35 day(s). You may fill up to a 90 day supply except for those on Specialty Tier 5, which can be filled up to a 30 day supply. Please call the pharmacy to process the prescription claim. ? ? ? ? ( [place] )  ?

## 2021-07-28 ENCOUNTER — Other Ambulatory Visit (HOSPITAL_COMMUNITY): Payer: Self-pay

## 2021-07-31 ENCOUNTER — Other Ambulatory Visit (HOSPITAL_COMMUNITY): Payer: Self-pay

## 2021-07-31 ENCOUNTER — Telehealth: Payer: Self-pay

## 2021-07-31 NOTE — Telephone Encounter (Signed)
Call to [place] patient has refills left.  No PA needed as patient has Medicare Part D. Pharmacy does not have a current Medicare Part D for patient will need to get card from patient and run the claim.  Patient to take card to the Pharmacy and will notify us if PA will need to go through [facility].

## 2021-07-31 NOTE — Telephone Encounter (Signed)
tirzepatide [facility]) 2.5 MG/0.5ML Pen, REFILL REQUEST @ [facility].

## 2021-08-01 ENCOUNTER — Other Ambulatory Visit (HOSPITAL_COMMUNITY): Payer: Self-pay

## 2021-08-03 ENCOUNTER — Telehealth: Payer: Self-pay

## 2021-08-03 ENCOUNTER — Other Ambulatory Visit (HOSPITAL_COMMUNITY): Payer: Self-pay

## 2021-08-03 NOTE — Telephone Encounter (Signed)
Pa for pt [facility] ) came through on cover my meds .Marland Kitchen But pty has new insurance she has brought the card  and new PA was started with her new insurance  last office notes and labs sent off also through cover my meds .Marland Kitchen Awaiting approval  or denial ..         UPDATE :   I also called pt to let her know she should take the new card to her pharmacy .Marland Kitchen

## 2021-08-03 NOTE — Telephone Encounter (Signed)
DECISION :    Approved today  Approved.   This drug has been approved under the Member's Medicare Part D benefit.    Approved quantity: 0.5 Milliliter(s) per 365 day(s). You may fill up to a 90 day supply except for those on Specialty Tier 5, which can be filled up to a 30 day supply.   Please call the pharmacy to process the prescription claim.   Drug Mounjaro 2.'5MG'$ /0.5ML pen-injectors   Form [employer] Prior [employer] Form [phone] NCPDP)     ( COPY ALONG WITH NEW INSURANCE CARD SENT TO PHARMACY ALSO )

## 2021-08-04 ENCOUNTER — Other Ambulatory Visit (HOSPITAL_COMMUNITY): Payer: Self-pay

## 2021-08-09 ENCOUNTER — Other Ambulatory Visit: Payer: Self-pay | Admitting: Internal Medicine

## 2021-08-09 DIAGNOSIS — Z1231 Encounter for screening mammogram for malignant neoplasm of breast: Secondary | ICD-10-CM

## 2021-08-14 ENCOUNTER — Encounter: Payer: Self-pay | Admitting: Student

## 2021-08-14 ENCOUNTER — Other Ambulatory Visit (HOSPITAL_COMMUNITY): Payer: Self-pay

## 2021-08-14 ENCOUNTER — Other Ambulatory Visit: Payer: Self-pay

## 2021-08-14 ENCOUNTER — Ambulatory Visit (INDEPENDENT_AMBULATORY_CARE_PROVIDER_SITE_OTHER): Payer: Medicare (Managed Care)

## 2021-08-14 ENCOUNTER — Ambulatory Visit (INDEPENDENT_AMBULATORY_CARE_PROVIDER_SITE_OTHER): Payer: Medicare (Managed Care) | Admitting: Student

## 2021-08-14 DIAGNOSIS — E119 Type 2 diabetes mellitus without complications: Secondary | ICD-10-CM

## 2021-08-14 DIAGNOSIS — Z7985 Long-term (current) use of injectable non-insulin antidiabetic drugs: Secondary | ICD-10-CM

## 2021-08-14 DIAGNOSIS — Z0000 Encounter for general adult medical examination without abnormal findings: Secondary | ICD-10-CM

## 2021-08-14 MED ORDER — MOUNJARO 2.5 MG/0.5ML ~~LOC~~ SOAJ
5.0000 mg | SUBCUTANEOUS | 2 refills | Status: DC
Start: 2021-08-14 — End: 2021-08-14

## 2021-08-14 MED ORDER — MOUNJARO 5 MG/0.5ML ~~LOC~~ SOAJ
5.0000 mg | SUBCUTANEOUS | 2 refills | Status: DC
  Filled 2021-08-14: qty 2, 28d supply, fill #0
  Filled 2021-09-07: qty 2, 28d supply, fill #1

## 2021-08-14 NOTE — Progress Notes (Signed)
Subjective:   Kristen Frost is a 66 y.o. female who presents for an Initial Medicare Annual Wellness Visit. I connected with  Haskell Riling on 11/01/21 by a  [place]     Patient Location: Other:  [place]   Provider Location: Office/Clinic  I discussed the limitations of evaluation and management by telemedicine. The patient expressed understanding and agreed to proceed.  Review of Systems    DEFERRED TO PCP  Cardiac Risk Factors include: advanced age (>9mn, >>66women);diabetes mellitus;hypertension     Objective:    Today's Vitals   08/14/21 1026  PainSc: 0-No pain   There is no height or weight on file to calculate BMI.     08/14/2021   10:36 AM 08/14/2021    9:33 AM 02/27/2021   10:09 AM 01/18/2021    9:14 AM 12/19/2020    9:26 AM 07/19/2020    9:10 AM 04/08/2020    9:19 AM  Advanced Directives  Does Patient Have a Medical Advance Directive? No No No No No No No  Would patient like information on creating a medical advance directive? No - Patient declined No - Patient declined No - Patient declined No - Patient declined Yes (MAU/Ambulatory/Procedural Areas - Information given) No - Patient declined Yes (MAU/Ambulatory/Procedural Areas - Information given)    Current Medications (verified) Outpatient Encounter Medications as of 08/14/2021  Medication Sig   amLODipine (NORVASC) 10 MG tablet TAKE 1 TABLET BY MOUTH DAILY   cetirizine (ZYRTEC ALLERGY) 10 MG tablet Take 1 tablet (10 mg total) by mouth daily.   Cholecalciferol (VITAMIN D) 2000 units CAPS Take 1 capsule (2,000 Units total) by mouth daily. (Patient taking differently: Take 2,000 Units by mouth daily.)   diclofenac Sodium (VOLTAREN) 1 % GEL Apply 4 g topically 4 (four) times daily.   fluticasone (FLONASE) 50 MCG/ACT nasal spray SHAKE LIQUID AND USE 1 SPRAY IN EACH NOSTRIL DAILY AS NEEDED   Insulin Pen Needle (PEN NEEDLES) 31G X 5 MM MISC 1 application by Does not apply route daily.    [DISCONTINUED] atorvastatin (LIPITOR) 40 MG tablet Take 1 tablet (40 mg total) by mouth daily.   [DISCONTINUED] tirzepatide ([facility] 2.5 MG/0.5ML Pen Inject 5 mg into the skin once a week.   ferrous sulfate 325 (65 FE) MG tablet Take 1 tablet (325 mg total) by mouth daily with breakfast. (Patient taking differently: Take 325 mg by mouth daily with breakfast. )   No facility-administered encounter medications on file as of 08/14/2021.    Allergies (verified) Patient has no known allergies.   History: Past Medical History:  Diagnosis Date   Anxiety    Arthritis    Bleeding hemorrhoids    grade 2   Diverticulosis of colon    Full dentures    History of colon polyps    Hyperlipidemia    Hypertension    followed by pcp   (04-30-2019  per pt had stress test approx. 2005, was told normal (not available in epic)   IDA (iron deficiency anemia)    OSA on CPAP    followed by dr sHalford Chessman([place] [facility])    (04-30-2019  pt  stated uses every night)   Restless leg syndrome    Type 2 diabetes mellitus ([place]    followed by pcp   (04-30-2019  pt stated does not check blood sugar at home)   Past Surgical History:  Procedure Laterality Date   CHOLECYSTECTOMY N/A 06/07/2016   Procedure:  LAPAROSCOPIC CHOLECYSTECTOMY;  Surgeon: Georganna Skeans, MD;  Location: [facility];  Service: General;  Laterality: N/A;   COLONOSCOPY WITH PROPOFOL  last one 04-10-2018    dr Loletha Carrow   ROTATOR CUFF REPAIR Left 2007   approx.   TRANSANAL HEMORRHOIDAL DEARTERIALIZATION N/A 05/07/2019   Procedure: TRANSANAL HEMORRHOIDAL DEARTERIALIZATION;  Surgeon: Leighton Ruff, MD;  Location: [facility];  Service: General;  Laterality: N/A;   Family History  Problem Relation Age of Onset   Allergies Father    Alcohol abuse Father    Tuberculosis Father        deceased from TB   Allergies Other        sibling   Asthma Other        sibling   Hypertension Other        sibling   Migraines Other        sibling   Rashes  / Skin problems Other        sibling   Gout Other        sibling   Social History   Socioeconomic History   Marital status: Married    Spouse name: Not on file   Number of children: Not on file   Years of education: Not on file   Highest education level: Not on file  Occupational History   Occupation: unemployeed  Tobacco Use   Smoking status: Never   Smokeless tobacco: Never  Vaping Use   Vaping Use: Never used  Substance and Sexual Activity   Alcohol use: No   Drug use: Never   Sexual activity: Not on file  Other Topics Concern   Not on file  Social History Narrative   Not on file   Social Determinants of Health   Financial Resource Strain: Low Risk  (08/14/2021)   Overall Financial Resource Strain (CARDIA)    Difficulty of Paying Living Expenses: Not hard at all  Food Insecurity: No Food Insecurity (08/14/2021)   Hunger Vital Sign    Worried About Running Out of Food in the Last Year: Never true    [place] in the Last Year: Never true  Transportation Needs: No Transportation Needs (08/14/2021)   PRAPARE - Hydrologist (Medical): No    Lack of Transportation (Non-Medical): No  Physical Activity: Sufficiently Active (08/14/2021)   Exercise Vital Sign    Days of Exercise per Week: 4 days    Minutes of Exercise per Session: 50 min  Stress: No Stress Concern Present (08/14/2021)   [place]    Feeling of Stress : Not at all  Social Connections: Moderately Integrated (08/14/2021)   Social Connection and Isolation Panel [NHANES]    Frequency of Communication with Friends and Family: More than three times a week    Frequency of Social Gatherings with Friends and Family: More than three times a week    Attends Religious Services: More than 4 times per year    Active Member of [employer] or Organizations: No    Attends Music therapist: Never    Marital Status:  Married    Tobacco Counseling Counseling given: Not Answered   Clinical Intake:  Pre-visit preparation completed: Yes  Pain : No/denies pain Pain Score: 0-No pain     BMI - recorded: 45 Nutritional Status: BMI > 30  Obese Nutritional Risks: None Diabetes: Yes Did pt. bring in CBG monitor from home?: No  How  often do you need to have someone help you when you read instructions, pamphlets, or other written materials from your doctor or pharmacy?: 1 - Never What is the last grade level you completed in school?: college  Diabetic?YES   Interpreter Needed?: No  Information entered by :: Macaela Presas   Activities of Daily Living    09/14/2021    8:42 AM 08/14/2021   10:37 AM  In your present state of health, do you have any difficulty performing the following activities:  Hearing? 0 1  Vision? 0 0  Difficulty concentrating or making decisions? 0 0  Walking or climbing stairs? 0 0  Dressing or bathing? 0 0  Doing errands, shopping? 0 0  Preparing Food and eating ?  N  Using the Toilet?  N  In the past six months, have you accidently leaked urine?  N  Do you have problems with loss of bowel control?  N  Managing your Medications?  N  Managing your Finances?  N  Housekeeping or managing your Housekeeping?  N    Patient Care Team: [name], MD as PCP - General  Indicate any recent Medical Services you may have received from other than [facility] providers in the past year (date may be approximate).     Assessment:   This is a routine wellness examination for [place].  Hearing/Vision screen No results found.  Dietary issues and exercise activities discussed: Current Exercise Habits: Structured exercise class, Type of exercise: Other - see comments (gym), Time (Minutes): 45, Frequency (Times/Week): 4, Weekly Exercise (Minutes/Week): 180, Intensity: Moderate, Exercise limited by: None identified   Goals Addressed   None   Depression Screen     09/14/2021   10:09 AM 08/14/2021    9:30 AM 02/27/2021   10:14 AM 01/18/2021    9:51 AM 12/19/2020    9:59 AM 04/08/2020   10:47 AM 01/06/2020    2:16 PM  PHQ 2/9 Scores  PHQ - 2 Score 1 1 0 1 1 0 0  PHQ- 9 Score 1 1 0 1 1 0 0    Fall Risk    08/14/2021   10:37 AM 08/14/2021    9:29 AM 06/28/2021    4:46 PM 02/27/2021   10:09 AM 01/18/2021    9:12 AM  Fall Risk   Falls in the past year? 0 0 0 0 0  Number falls in past yr: 0 0 0 0 0  Injury with Fall? 0 0 0 0 0  Risk for fall due to : No Fall Risks No Fall Risks No Fall Risks  No Fall Risks  Follow up Falls evaluation completed;Falls prevention discussed Falls evaluation completed Falls evaluation completed Falls evaluation completed Falls evaluation completed    FALL RISK PREVENTION PERTAINING TO THE HOME:  Any stairs in or around the home? Yes  If so, are there any without handrails? No  Home free of loose throw rugs in walkways, pet beds, electrical cords, etc? Yes  Adequate lighting in your home to reduce risk of falls? Yes   ASSISTIVE DEVICES UTILIZED TO PREVENT FALLS:  Life alert? No  Use of a cane, walker or w/c? No  Grab bars in the bathroom? No  Shower chair or bench in shower? No  Elevated toilet seat or a handicapped toilet? No   TIMED UP AND GO:  Was the test performed? No .  Length of time to ambulate 10 feet: N/A sec.     Cognitive Function:  08/14/2021   10:38 AM  6CIT Screen  What Year? 0 points  What month? 0 points  What time? 0 points  Months in reverse 0 points  Repeat phrase 0 points    Immunizations Immunization History  Administered Date(s) Administered   Pneumococcal Conjugate-13 01/18/2021    TDAP status: Due, Education has been provided regarding the importance of this vaccine. Advised may receive this vaccine at local pharmacy or Health Dept. Aware to provide a copy of the vaccination record if obtained from local pharmacy or Health Dept. Verbalized acceptance and  understanding.  Flu Vaccine status: Up to date  Pneumococcal vaccine status: Up to date  Covid-19 vaccine status: Declined, Education has been provided regarding the importance of this vaccine but patient still declined. Advised may receive this vaccine at local pharmacy or Health Dept.or vaccine clinic. Aware to provide a copy of the vaccination record if obtained from local pharmacy or Health Dept. Verbalized acceptance and understanding.  Qualifies for Shingles Vaccine? Yes   Zostavax completed No   Shingrix Completed?: No.    Education has been provided regarding the importance of this vaccine. Patient has been advised to call insurance company to determine out of pocket expense if they have not yet received this vaccine. Advised may also receive vaccine at local pharmacy or Health Dept. Verbalized acceptance and understanding.  Screening Tests Health Maintenance  Topic Date Due   COVID-19 Vaccine (1) Never done   TETANUS/TDAP  Never done   Zoster Vaccines- Shingrix (1 of 2) Never done   INFLUENZA VACCINE  Never done   Pneumonia Vaccine 65+ Years old (2 - PPSV23 or PCV20) 01/18/2022   HEMOGLOBIN A1C  12/29/2021   OPHTHALMOLOGY EXAM  01/31/2022   Diabetic kidney evaluation - GFR measurement  06/29/2022   Diabetic kidney evaluation - Urine ACR  06/29/2022   FOOT EXAM  06/29/2022   MAMMOGRAM  09/13/2023   COLONOSCOPY (Pts 45-1yr Insurance coverage will need to be confirmed)  04/10/2025   DEXA SCAN  Completed   Hepatitis C Screening  Completed   HPV VACCINES  Aged Out    Health Maintenance  Health Maintenance Due  Topic Date Due   COVID-19 Vaccine (1) Never done   TETANUS/TDAP  Never done   Zoster Vaccines- Shingrix (1 of 2) Never done   INFLUENZA VACCINE  Never done   Pneumonia Vaccine 66 Years old (2 - PPSV23 or PCV20) 01/18/2022    Colorectal cancer screening: Type of screening: Colonoscopy. Completed 04/10/2018. Repeat every 7 years  Mammogram status: Completed  upcoming ( 09/12/2021). Repeat every year  Bone Density status: Completed 05/30/2021. Results reflect: Bone density results: OSTEOPENIA. Repeat every 2 years.  Lung Cancer Screening: (Low Dose CT Chest recommended if Age 66-80years, 30 pack-year currently smoking OR have quit w/in 15years.) does not qualify.   Lung Cancer Screening Referral: DEFERRED TO PCP   Additional Screening:  Hepatitis C Screening: does qualify; Completed 01/18/2021  Vision Screening: Recommended annual ophthalmology exams for early detection of glaucoma and other disorders of the eye. Is the patient up to date with their annual eye exam?  Yes  Who is the provider or what is the name of the office in which the patient attends annual eye exams?  DR GKaty Fitch If pt is not established with a provider, would they like to be referred to a provider to establish care? No .   Dental Screening: Recommended annual dental exams for proper oral hygiene  Community Resource Referral /  Chronic Care Management: CRR required this visit?  No   [name] required this visit?  No      Plan:     I have personally reviewed and noted the following in the patient's chart:   Medical and social history Use of alcohol, tobacco or illicit drugs  Current medications and supplements including opioid prescriptions. Patient is not currently taking opioid prescriptions. Functional ability and status Nutritional status Physical activity Advanced directives List of other physicians Hospitalizations, surgeries, and ER visits in previous 12 months Vitals Screenings to include cognitive, depression, and falls Referrals and appointments  In addition, I have reviewed and discussed with patient certain preventive protocols, quality metrics, and best practice recommendations. A written personalized care plan for preventive services as well as general preventive health recommendations were provided to patient.     Judyann Munson, CMA   11/01/2021    Nurse Notes: IN PERSON [facility] CLINIC    Ms. Bohac , Thank you for taking time to come for your Medicare Wellness Visit. I appreciate your ongoing commitment to your health goals. Please review the following plan we discussed and let me know if I can assist you in the future.   These are the goals we discussed:  Goals       Blood Pressure < 130/80 (pt-stated)      BP Readings from Last 3 Encounters:  12/21/19 128/64  11/09/19 137/75  10/29/19 (!) 142/79   Meeting treatment targets for blood pressure. Marland Kitchen       HEMOGLOBIN A1C < 7      Lab Results  Component Value Date   HGBA1C 6.0 (A) 09/22/2019   Meeting Hgb A1C target      LDL CALC < 100      Lab Results  Component Value Date   CHOL 140 02/28/2017   HDL 52 02/28/2017   LDLCALC 71 02/28/2017   TRIG 85 02/28/2017   CHOLHDL 2.7 02/28/2017          Make and Keep All Appointments      Timeframe:  Long-Range Goal Priority:  High Start Date:   02/05/20                          Expected End Date:     ongoing                  Follow Up Date 10/18/20   - call to cancel if needed - keep a calendar with appointment dates    Why is this important?   Part of staying healthy is seeing the doctor for follow-up care.  If you forget your appointments, there are some things you can do to stay on track.    Notes: 08/23/20- meeting goal      Anthony My Quality of Life      Timeframe:  Long-Range Goal Priority:  High Start Date:           02/05/20                  Expected End Date:     ongoing                  Follow Up Date 10/18/20   - do one enjoyable thing every day    Why is this important?   Having a long-term illness can be scary.  It can also be stressful for you and your caregiver.  These steps may help.  Notes: 08/23/20- continues to attend exercise class and continues to lose weight        This is a list of the screening recommended for you and due dates:  Health Maintenance  Topic Date Due   COVID-19  Vaccine (1) Never done   Tetanus Vaccine  Never done   Zoster (Shingles) Vaccine (1 of 2) Never done   Flu Shot  Never done   Pneumonia Vaccine (2 - PPSV23 or PCV20) 01/18/2022   Hemoglobin A1C  12/29/2021   Eye exam for diabetics  01/31/2022   Yearly kidney function blood test for diabetes  06/29/2022   Yearly kidney health urinalysis for diabetes  06/29/2022   Complete foot exam   06/29/2022   Mammogram  09/13/2023   Colon Cancer Screening  04/10/2025   DEXA scan (bone density measurement)  Completed   Hepatitis C Screening: USPSTF Recommendation to screen - Ages 30-79 yo.  Completed   HPV Vaccine  Aged Out

## 2021-08-15 ENCOUNTER — Other Ambulatory Visit: Payer: Self-pay

## 2021-08-15 DIAGNOSIS — E785 Hyperlipidemia, unspecified: Secondary | ICD-10-CM

## 2021-08-15 MED ORDER — ATORVASTATIN CALCIUM 40 MG PO TABS: 40.0000 mg | ORAL_TABLET | Freq: Every day | ORAL | 3 refills | Status: DC

## 2021-08-17 NOTE — Progress Notes (Signed)
Internal Medicine Clinic Attending  Case discussed with Dr. Jinwala  At the time of the visit.  We reviewed the resident's history and exam and pertinent patient test results.  I agree with the assessment, diagnosis, and plan of care documented in the resident's note.  

## 2021-09-07 ENCOUNTER — Telehealth: Payer: Self-pay

## 2021-09-07 ENCOUNTER — Other Ambulatory Visit (HOSPITAL_COMMUNITY): Payer: Self-pay

## 2021-09-07 NOTE — Telephone Encounter (Signed)
Pt called in needing a refill on Mounjaro. Pt thought she had called the [facility] outpatient pharmacy. Transferred pt to the pharmacy successfully.

## 2021-09-12 ENCOUNTER — Ambulatory Visit
Admission: RE | Admit: 2021-09-12 | Discharge: 2021-09-12 | Disposition: A | Discharge status: Home / Self Care | Payer: Medicare (Managed Care) | Source: Ambulatory Visit | Attending: Internal Medicine | Admitting: Internal Medicine

## 2021-09-12 DIAGNOSIS — Z1231 Encounter for screening mammogram for malignant neoplasm of breast: Secondary | ICD-10-CM

## 2021-09-14 ENCOUNTER — Ambulatory Visit (INDEPENDENT_AMBULATORY_CARE_PROVIDER_SITE_OTHER): Payer: Medicare (Managed Care) | Admitting: Student

## 2021-09-14 ENCOUNTER — Ambulatory Visit (HOSPITAL_COMMUNITY): Payer: Medicare (Managed Care) | Attending: Internal Medicine

## 2021-09-14 ENCOUNTER — Other Ambulatory Visit (HOSPITAL_COMMUNITY): Payer: Self-pay

## 2021-09-14 VITALS — BP 117/64 | HR 73 | Wt 243.5 lb

## 2021-09-14 DIAGNOSIS — I499 Cardiac arrhythmia, unspecified: Secondary | ICD-10-CM

## 2021-09-14 DIAGNOSIS — F419 Anxiety disorder, unspecified: Secondary | ICD-10-CM

## 2021-09-14 DIAGNOSIS — Z0000 Encounter for general adult medical examination without abnormal findings: Secondary | ICD-10-CM

## 2021-09-14 DIAGNOSIS — N182 Chronic kidney disease, stage 2 (mild): Secondary | ICD-10-CM

## 2021-09-14 DIAGNOSIS — E1122 Type 2 diabetes mellitus with diabetic chronic kidney disease: Secondary | ICD-10-CM

## 2021-09-14 DIAGNOSIS — E119 Type 2 diabetes mellitus without complications: Secondary | ICD-10-CM

## 2021-09-14 DIAGNOSIS — I491 Atrial premature depolarization: Secondary | ICD-10-CM

## 2021-09-14 DIAGNOSIS — I10 Essential (primary) hypertension: Secondary | ICD-10-CM

## 2021-09-14 DIAGNOSIS — E785 Hyperlipidemia, unspecified: Secondary | ICD-10-CM

## 2021-09-14 DIAGNOSIS — Z7985 Long-term (current) use of injectable non-insulin antidiabetic drugs: Secondary | ICD-10-CM

## 2021-09-14 DIAGNOSIS — I129 Hypertensive chronic kidney disease with stage 1 through stage 4 chronic kidney disease, or unspecified chronic kidney disease: Secondary | ICD-10-CM

## 2021-09-14 MED ORDER — MOUNJARO 10 MG/0.5ML ~~LOC~~ SOAJ
10.0000 mg | SUBCUTANEOUS | 2 refills | Status: DC
  Filled 2021-09-14 – 2021-09-15 (×2): qty 2, 28d supply, fill #0
  Filled 2021-10-09: qty 2, 28d supply, fill #1
  Filled 2021-11-07: qty 2, 28d supply, fill #2

## 2021-09-14 NOTE — Assessment & Plan Note (Signed)
We following with [facility] in our clinic however Dr. Theodis Shove is no longer with our clinic. Initially not interested in continuing with new provider however on further discussion she would like to go ahead and have referral placed.   -new referral to [facility] placed

## 2021-09-14 NOTE — Progress Notes (Signed)
CC: Chronic management - T2DM, HTN  HPI:  Kristen Frost is a 66 y.o. female living with a history stated below and presents today for chronic medical conditions. Please see problem based assessment and plan for additional details.  Past Medical History:  Diagnosis Date   Anxiety    Arthritis    Bleeding hemorrhoids    grade 2   Diverticulosis of colon    Full dentures    History of colon polyps    Hyperlipidemia    Hypertension    followed by pcp   (04-30-2019  per pt had stress test approx. 2005, was told normal (not available in epic)   IDA (iron deficiency anemia)    OSA on CPAP    followed by dr Halford Chessman ([place] [facility])    (04-30-2019  pt  stated uses every night)   Restless leg syndrome    Type 2 diabetes mellitus ([place])    followed by pcp   (04-30-2019  pt stated does not check blood sugar at home)    Current Outpatient Medications on File Prior to Visit  Medication Sig Dispense Refill   amLODipine (NORVASC) 10 MG tablet TAKE 1 TABLET BY MOUTH DAILY 90 tablet 3   atorvastatin (LIPITOR) 40 MG tablet Take 1 tablet (40 mg total) by mouth daily. 90 tablet 3   cetirizine (ZYRTEC ALLERGY) 10 MG tablet Take 1 tablet (10 mg total) by mouth daily. 90 tablet 3   Cholecalciferol (VITAMIN D) 2000 units CAPS Take 1 capsule (2,000 Units total) by mouth daily. (Patient taking differently: Take 2,000 Units by mouth daily.) 90 capsule 1   diclofenac Sodium (VOLTAREN) 1 % GEL Apply 4 g topically 4 (four) times daily. 50 g 1   ferrous sulfate 325 (65 FE) MG tablet Take 1 tablet (325 mg total) by mouth daily with breakfast. (Patient taking differently: Take 325 mg by mouth daily with breakfast. ) 90 tablet 1   fluticasone (FLONASE) 50 MCG/ACT nasal spray SHAKE LIQUID AND USE 1 SPRAY IN EACH NOSTRIL DAILY AS NEEDED 16 g 3   Insulin Pen Needle (PEN NEEDLES) 31G X 5 MM MISC 1 application by Does not apply route daily. 90 each 1   No current facility-administered medications on file prior  to visit.    Family History  Problem Relation Age of Onset   Allergies Father    Alcohol abuse Father    Tuberculosis Father        deceased from TB   Allergies Other        sibling   Asthma Other        sibling   Hypertension Other        sibling   Migraines Other        sibling   Rashes / Skin problems Other        sibling   Gout Other        sibling    Social History   Socioeconomic History   Marital status: Married    Spouse name: Not on file   Number of children: Not on file   Years of education: Not on file   Highest education level: Not on file  Occupational History   Occupation: unemployeed  Tobacco Use   Smoking status: Never   Smokeless tobacco: Never  Vaping Use   Vaping Use: Never used  Substance and Sexual Activity   Alcohol use: No   Drug use: Never   Sexual activity: Not on file  Other  Topics Concern   Not on file  Social History Narrative   Not on file   Social Determinants of Health   Financial Resource Strain: Low Risk  (08/14/2021)   Overall Financial Resource Strain (CARDIA)    Difficulty of Paying Living Expenses: Not hard at all  Food Insecurity: No Food Insecurity (08/14/2021)   Hunger Vital Sign    Worried About Running Out of Food in the Last Year: Never true    Ran Out of Food in the Last Year: Never true  Transportation Needs: No Transportation Needs (08/14/2021)   PRAPARE - Hydrologist (Medical): No    Lack of Transportation (Non-Medical): No  Physical Activity: Sufficiently Active (08/14/2021)   Exercise Vital Sign    Days of Exercise per Week: 4 days    Minutes of Exercise per Session: 50 min  Stress: No Stress Concern Present (08/14/2021)   [place]    Feeling of Stress : Not at all  Social Connections: Moderately Integrated (08/14/2021)   Social Connection and Isolation Panel [NHANES]    Frequency of Communication with Friends  and Family: More than three times a week    Frequency of Social Gatherings with Friends and Family: More than three times a week    Attends Religious Services: More than 4 times per year    Active Member of [employer] or Organizations: No    Attends Archivist Meetings: Never    Marital Status: Married  Human resources officer Violence: Not At Risk (08/14/2021)   Humiliation, Afraid, Rape, and Kick questionnaire    Fear of Current or Ex-Partner: No    Emotionally Abused: No    Physically Abused: No    Sexually Abused: No    Review of Systems: ROS negative except for what is noted on the assessment and plan.  Vitals:   09/14/21 0837  BP: 117/64  Pulse: 73  SpO2: 98%  Weight: 243 lb 8 oz (110.5 kg)    Physical Exam: Constitutional: in no acute distress HENT: normocephalic atraumatic Eyes: conjunctiva non-erythematous Neck: supple Cardiovascular: Irregular rhythm. Normal Rate. No murmurs, gallops, rubs Pulmonary/Chest: normal work of breathing on room air MSK: normal bulk and tone Neurological: alert & oriented x 3 Skin: warm and dry Psych: normal mood  Assessment & Plan:   HTN (hypertension) BP well controlled and in goal on amlodipine 10 mg daily. Her microalbuminuria to creatinine ratio has been normal, if she does start developing proteinuria can transition to ARB   T2DM (type 2 diabetes mellitus) ([place]) Last A1c of 5.8% Doing well on mounjaro 5 mg weekly. Working on diet and exercise, despite her increased life stressors of her husband recently being diagnosed with hepatic malignancy.   We will continue to up-titrate her mounjaor to 10 mg. She has done well since starting and without GI side effects. If symptomatic or unable to get the 10 mg, will drop to 7.'5mg'$  and see how she does.   -increase mounjaro to 10 mg weekly -follow up one month A1c and how she is doing with increased dose.   Anxiety We following with [facility] in our clinic however Dr. Theodis Shove is no longer with  our clinic. Initially not interested in continuing with new provider however on further discussion she would like to go ahead and have referral placed.   -new referral to [facility] placed  Healthcare maintenance Husband recently diagnosed with hepatic malignancy. She is uncertain of the cause.  Her last Hep C ab was 7 months ago and was negative. Hep A and B vaccination not in chart. Asked patient to follow up with those if had them in the past as well as with her husbands oncologist to see if she needs any screenings  Dyslipidemia Has done well on atorvastatin 40 mg daily for primary prevention. Last lipid panel in the clinic had elevated total C and LDL. Goal would be LDL under 100. She notes she has missed a few days. Will continue atorva 40 mg and have her repeat levels in 1-2 months when she has a blood draw for A1c  Premature atrial beat Irregular rhythm on exam. She denies shortness of breath, palpitations, chest pain, or fluttering. EKG performed with my interpretation of regular rhythm. Machine read of PAC's.   Will continue to monitor and discussed if the above symptoms occur she should schedule an appointment with our clinic immediately.   Patient discussed with Dr. Verlee Rossetti, D.O. [facility], PGY-3 Phone: [phone] Date 09/14/2021 Time 9:58 AM

## 2021-09-14 NOTE — Assessment & Plan Note (Signed)
BP well controlled and in goal on amlodipine 10 mg daily. Her microalbuminuria to creatinine ratio has been normal, if she does start developing proteinuria can transition to ARB

## 2021-09-14 NOTE — Assessment & Plan Note (Signed)
Last A1c of 5.8% Doing well on mounjaro 5 mg weekly. Working on diet and exercise, despite her increased life stressors of her husband recently being diagnosed with hepatic malignancy.   We will continue to up-titrate her mounjaor to 10 mg. She has done well since starting and without GI side effects. If symptomatic or unable to get the 10 mg, will drop to 7.'5mg'$  and see how she does.   -increase mounjaro to 10 mg weekly -follow up one month A1c and how she is doing with increased dose.

## 2021-09-14 NOTE — Assessment & Plan Note (Signed)
Husband recently diagnosed with hepatic malignancy. She is uncertain of the cause. Her last Hep C ab was 7 months ago and was negative. Hep A and B vaccination not in chart. Asked patient to follow up with those if had them in the past as well as with her husbands oncologist to see if she needs any screenings

## 2021-09-14 NOTE — Patient Instructions (Addendum)
Thank you, Ms.Haskell Riling for allowing [place] to provide your care today. Today we discussed.  Diabetes We will increase your mounjaro to 10 mg weekly. If you have side effects of nausea, vomiting, or anything else, please call us and we will go back to 5 mg dosing. If you are unable to pick this up or it is on back order, please continue the 5 mg dosing.   Please continue to exercise as you can and eat a healthy diet.   You are doing through a lot with your husband, please know we are all here for you with whatever it is you need.   Next visit in 1-2 months we will check your A1c and cholesterol levels.     I have ordered the following labs for you:  Lab Orders  No laboratory test(s) ordered today    Referrals ordered today:   Referral Orders  No referral(s) requested today     I have ordered the following medication/changed the following medications:   Stop the following medications: Medications Discontinued During This Encounter  Medication Reason   tirzepatide Darcel Bayley) 5 MG/0.5ML Pen      Start the following medications: Meds ordered this encounter  Medications   tirzepatide (MOUNJARO) 10 MG/0.5ML Pen    Sig: Inject 10 mg into the skin once a week.    Dispense:  0.5 mL    Refill:  2     Follow up: 1-2 months for A1c    Should you have any questions or concerns please call the internal medicine clinic at [phone].    Sanjuana Letters, D.O. [place]

## 2021-09-14 NOTE — Assessment & Plan Note (Signed)
Has done well on atorvastatin 40 mg daily for primary prevention. Last lipid panel in the clinic had elevated total C and LDL. Goal would be LDL under 100. She notes she has missed a few days. Will continue atorva 40 mg and have her repeat levels in 1-2 months when she has a blood draw for A1c

## 2021-09-14 NOTE — Assessment & Plan Note (Signed)
Irregular rhythm on exam. She denies shortness of breath, palpitations, chest pain, or fluttering. EKG performed with my interpretation of regular rhythm. Machine read of PAC's.   Will continue to monitor and discussed if the above symptoms occur she should schedule an appointment with our clinic immediately.

## 2021-09-15 ENCOUNTER — Other Ambulatory Visit (HOSPITAL_COMMUNITY): Payer: Self-pay

## 2021-09-18 NOTE — Progress Notes (Signed)
Internal Medicine Clinic Attending  Case discussed with Dr. [name]  At the time of the visit.  We reviewed the resident's history and exam and pertinent patient test results.  I agree with the assessment, diagnosis, and plan of care documented in the resident's note.  

## 2021-10-04 ENCOUNTER — Other Ambulatory Visit (HOSPITAL_COMMUNITY): Payer: Self-pay

## 2021-10-09 ENCOUNTER — Other Ambulatory Visit (HOSPITAL_COMMUNITY): Payer: Self-pay

## 2021-11-02 NOTE — Progress Notes (Signed)
I reviewed the AWV findings with the provider who conducted the visit. I was present in the office suite and immediately available to provide assistance and direction throughout the time the service was provided.  

## 2021-11-06 NOTE — Progress Notes (Signed)
Internal Medicine Clinic Attending  Case and documentation of Dr. [name]  at the time of the visit reviewed.  I reviewed the AWV findings.  I agree with the assessment, diagnosis, and plan of care documented in the AWV note.     

## 2021-11-07 ENCOUNTER — Other Ambulatory Visit (HOSPITAL_COMMUNITY): Payer: Self-pay

## 2021-11-10 ENCOUNTER — Ambulatory Visit (INDEPENDENT_AMBULATORY_CARE_PROVIDER_SITE_OTHER): Payer: Medicare (Managed Care) | Admitting: Student

## 2021-11-10 VITALS — BP 113/77 | HR 79 | Temp 98.4°F | Ht 61.0 in | Wt 241.5 lb

## 2021-11-10 DIAGNOSIS — H9041 Sensorineural hearing loss, unilateral, right ear, with unrestricted hearing on the contralateral side: Secondary | ICD-10-CM

## 2021-11-10 DIAGNOSIS — E119 Type 2 diabetes mellitus without complications: Secondary | ICD-10-CM | POA: Diagnosis not present

## 2021-11-10 DIAGNOSIS — Z0000 Encounter for general adult medical examination without abnormal findings: Secondary | ICD-10-CM

## 2021-11-10 DIAGNOSIS — Z7984 Long term (current) use of oral hypoglycemic drugs: Secondary | ICD-10-CM

## 2021-11-10 DIAGNOSIS — H905 Unspecified sensorineural hearing loss: Secondary | ICD-10-CM

## 2021-11-10 NOTE — Progress Notes (Addendum)
CC: follow-up  HPI:  Kristen Frost is a 66 y.o. person with hypertension, OSA, type II diabetes mellitus, CKDII presenting to [facility] for follow-up.   Please see problem-based list for further details, assessments, and plans.  Past Medical History:  Diagnosis Date   Anxiety    Arthritis    Bleeding hemorrhoids    grade 2   Diverticulosis of colon    Full dentures    History of colon polyps    Hyperlipidemia    Hypertension    followed by pcp   (04-30-2019  per pt had stress test approx. 2005, was told normal (not available in epic)   IDA (iron deficiency anemia)    OSA on CPAP    followed by dr Halford Chessman ( pulm)    (04-30-2019  pt  stated uses every night)   Restless leg syndrome    Type 2 diabetes mellitus ([place])    followed by pcp   (04-30-2019  pt stated does not check blood sugar at home)   Review of Systems:  As per HPI  Physical Exam:  Vitals:   11/10/21 0856  BP: 113/77  Pulse: 79  Temp: 98.4 F (36.9 C)  TempSrc: Oral  SpO2: 100%  Weight: 241 lb 8 oz (109.5 kg)  Height: '5\' 1"'$  (1.549 m)   General: Resting comfortably in chair, no acute distress HENT: External bilateral ears without abnormalities. No erythema or open wounds in the canals bilaterally. On the right, yellow-white material on the tympanic membrane, but it appears the membrane is in tact and clear. On the left, tympanic membrane in tact and clear. Weber lateralized to L. Rinne AC>BC bilaterally.  CV: Regular rate, rhythm. No murmurs appreciated. No extra heart sounds. Warm extremities. Pulm: Normal work of breathing on room air. Clear to auscultation bilaterally.  MSK: Normal bulk, tone. No peripheral edema noted Neuro: Awake, alert, conversing appropriately. Grossly non-focal.  Assessment & Plan:   Sensorineural hearing loss (SNHL) of right ear Kristen Frost is presenting today with chronic symptom of difficulty hearing out of her right ear. Last year, Kristen Frost came to clinic with  similar presentation and was found to have cerumen impaction. Since that time she has been using Debrox to help clean the wax. However, she reports her hearing has continued to decline on the right side. Feels like her hearing has declined "over years" now. She denies any trauma, tinnitus, sore throat, runny nose, headaches, vision changes.   On examination, there are patches of yellow-white material of the membrane.  Unclear whether this penetrates through the membrane or is just on the outside. Could represent cholesteatoma? Discussed with Kristen Frost, we will plan to refer her to ENT for further evaluation. She is agreeable to the plan.  - ENT referral  T2DM (type 2 diabetes mellitus) ([place]) Kristen Frost reports that she has been doing well on the increased dose of Mounjaro. She isn't sure if she has lost much weight given the amount of stress she is under being the caregiver of her husband. She mentions some mild nausea during the first week of the new dose, but no vomiting. Since that time, she has been tolerating well. We will plan to continue with current dose.  Will hold off another A1c today. Weight is mildly down since initiating. Will continue with her current regimen and have her follow-up in three months for A1c re-check.  - Tirzepatide '10mg'$  weekly - Follow-up in three months  Healthcare maintenance - Refused influenza vaccine  today  Patient discussed with Dr. Denita Lung, MD Internal Medicine PGY-3 Pager: [phone]

## 2021-11-10 NOTE — Assessment & Plan Note (Signed)
Ms. Crenshaw is presenting today with chronic symptom of difficulty hearing out of her right ear. Last year, Ms. Hemm came to clinic with similar presentation and was found to have cerumen impaction. Since that time she has been using Debrox to help clean the wax. However, she reports her hearing has continued to decline on the right side. Feels like her hearing has declined "over years" now. She denies any trauma, tinnitus, sore throat, runny nose, headaches, vision changes.   On examination, there are patches of yellow-white material of the membrane.  Unclear whether this penetrates through the membrane or is just on the outside. Could represent cholesteatoma? Discussed with [name], we will plan to refer her to ENT for further evaluation. She is agreeable to the plan.  - ENT referral

## 2021-11-10 NOTE — Patient Instructions (Signed)
Ms.Arleta Drye Randol Kern, it was a pleasure seeing you today!  Today we discussed: - I have referred you to the ears, nose, and throat doctors. They will reach out to schedule an appointment (not next Wednesday-Monday!) Have a great trip to [place]!   Referrals ordered today:    Referral Orders         Ambulatory referral to ENT      Follow-up: 3-4 months   Please make sure to arrive 15 minutes prior to your next appointment. If you arrive late, you may be asked to reschedule.   We look forward to seeing you next time. Please call our clinic at [phone] if you have any questions or concerns. The best time to call is Monday-Friday from 9am-4pm, but there is someone available 24/7. If after hours or the weekend, call the main hospital number and ask for the Internal Medicine Resident On-Call. If you need medication refills, please notify your pharmacy one week in advance and they will send [place] a request.  Thank you for letting us take part in your care. Wishing you the best!  Thank you, Sanjuan Dame, MD

## 2021-11-10 NOTE — Assessment & Plan Note (Signed)
-   Refused influenza vaccine today

## 2021-11-10 NOTE — Assessment & Plan Note (Signed)
Ms. Andrzejewski reports that she has been doing well on the increased dose of Mounjaro. She isn't sure if she has lost much weight given the amount of stress she is under being the caregiver of her husband. She mentions some mild nausea during the first week of the new dose, but no vomiting. Since that time, she has been tolerating well. We will plan to continue with current dose.  Will hold off another A1c today. Weight is mildly down since initiating. Will continue with her current regimen and have her follow-up in three months for A1c re-check.  - Tirzepatide '10mg'$  weekly - Follow-up in three months

## 2021-11-14 NOTE — Progress Notes (Signed)
Internal Medicine Clinic Attending  Case discussed with the resident at the time of the visit.  We reviewed the resident's history and exam and pertinent patient test results.  I agree with the assessment, diagnosis, and plan of care documented in the resident's note.  

## 2021-12-08 ENCOUNTER — Other Ambulatory Visit (HOSPITAL_COMMUNITY): Payer: Self-pay

## 2021-12-08 ENCOUNTER — Other Ambulatory Visit: Payer: Self-pay

## 2021-12-08 DIAGNOSIS — E119 Type 2 diabetes mellitus without complications: Secondary | ICD-10-CM

## 2021-12-08 MED ORDER — MOUNJARO 10 MG/0.5ML ~~LOC~~ SOAJ
10.0000 mg | SUBCUTANEOUS | 2 refills | Status: DC
  Filled 2021-12-08: qty 2, 28d supply, fill #0
  Filled 2022-01-05: qty 2, 28d supply, fill #1
  Filled 2022-01-28: qty 2, 28d supply, fill #2

## 2022-01-05 ENCOUNTER — Other Ambulatory Visit (HOSPITAL_COMMUNITY): Payer: Self-pay

## 2022-02-20 ENCOUNTER — Other Ambulatory Visit: Payer: Self-pay

## 2022-02-20 ENCOUNTER — Ambulatory Visit (INDEPENDENT_AMBULATORY_CARE_PROVIDER_SITE_OTHER): Payer: Medicare (Managed Care) | Admitting: Student

## 2022-02-20 ENCOUNTER — Other Ambulatory Visit (HOSPITAL_COMMUNITY): Payer: Self-pay

## 2022-02-20 ENCOUNTER — Encounter: Payer: Self-pay | Admitting: Student

## 2022-02-20 VITALS — BP 135/68 | HR 82 | Temp 98.3°F | Ht 61.5 in | Wt 236.4 lb

## 2022-02-20 DIAGNOSIS — I10 Essential (primary) hypertension: Secondary | ICD-10-CM

## 2022-02-20 DIAGNOSIS — Z7985 Long-term (current) use of injectable non-insulin antidiabetic drugs: Secondary | ICD-10-CM

## 2022-02-20 DIAGNOSIS — Z794 Long term (current) use of insulin: Secondary | ICD-10-CM

## 2022-02-20 DIAGNOSIS — I129 Hypertensive chronic kidney disease with stage 1 through stage 4 chronic kidney disease, or unspecified chronic kidney disease: Secondary | ICD-10-CM | POA: Diagnosis not present

## 2022-02-20 DIAGNOSIS — G4733 Obstructive sleep apnea (adult) (pediatric): Secondary | ICD-10-CM

## 2022-02-20 DIAGNOSIS — N182 Chronic kidney disease, stage 2 (mild): Secondary | ICD-10-CM

## 2022-02-20 DIAGNOSIS — E1122 Type 2 diabetes mellitus with diabetic chronic kidney disease: Secondary | ICD-10-CM

## 2022-02-20 DIAGNOSIS — E785 Hyperlipidemia, unspecified: Secondary | ICD-10-CM

## 2022-02-20 DIAGNOSIS — F419 Anxiety disorder, unspecified: Secondary | ICD-10-CM

## 2022-02-20 DIAGNOSIS — E119 Type 2 diabetes mellitus without complications: Secondary | ICD-10-CM

## 2022-02-20 DIAGNOSIS — H9041 Sensorineural hearing loss, unilateral, right ear, with unrestricted hearing on the contralateral side: Secondary | ICD-10-CM

## 2022-02-20 DIAGNOSIS — J302 Other seasonal allergic rhinitis: Secondary | ICD-10-CM

## 2022-02-20 DIAGNOSIS — E559 Vitamin D deficiency, unspecified: Secondary | ICD-10-CM

## 2022-02-20 DIAGNOSIS — Z634 Disappearance and death of family member: Secondary | ICD-10-CM

## 2022-02-20 DIAGNOSIS — I491 Atrial premature depolarization: Secondary | ICD-10-CM

## 2022-02-20 DIAGNOSIS — Z0000 Encounter for general adult medical examination without abnormal findings: Secondary | ICD-10-CM

## 2022-02-20 DIAGNOSIS — Z23 Encounter for immunization: Secondary | ICD-10-CM | POA: Diagnosis not present

## 2022-02-20 LAB — POCT GLYCOSYLATED HEMOGLOBIN (HGB A1C): Hemoglobin A1C: 6.1 % — AB (ref 4.0–5.6)

## 2022-02-20 LAB — GLUCOSE, CAPILLARY: Glucose-Capillary: 85 mg/dL (ref 70–99)

## 2022-02-20 MED ORDER — MOUNJARO 10 MG/0.5ML ~~LOC~~ SOAJ
10.0000 mg | SUBCUTANEOUS | 2 refills | Status: DC
  Filled 2022-02-20: qty 2, 28d supply, fill #0
  Filled 2022-03-23: qty 2, 28d supply, fill #1
  Filled 2022-04-23: qty 2, 28d supply, fill #2

## 2022-02-20 NOTE — Patient Instructions (Addendum)
Thank you, Ms.Haskell Riling for allowing [place] to provide your care today. Today we discussed.  Diabetes You are doing well! Continue to eat a healthy diet (fruit, vegetables, low fat protein) and continue to exercise at the senior center. We will recheck your A1c in 6 months. Continue to check your feet regularly for any wounds.   High cholesterol   We will recheck your cholesterol levels today. Please continue to take your atorvastatin.   High blood pressure Please continue to take your amlodipine and record your blood pressures at home. Our goal for you is in the 120's. If higher, we may add another medication if you are consistently high.   Vaccines Please follow up with your local pharmacy to receive your tetanus shot. You received your second pneumonia shot during this visit. If you change your mind about the covid and flu vaccines please let us know.   Sleep apnea Please continue to wear your CPAP and follow with Dr. Halford Chessman  Hearing loss Please follow up [facility]   Vitamin supplements Please continue to take your multivitamins. Please bring them in during your next visit so that we can review them together.   I have ordered the following labs for you:  Referrals ordered today:   Referral Orders  No referral(s) requested today     I have ordered the following medication/changed the following medications:   Stop the following medications: Medications Discontinued During This Encounter  Medication Reason   tirzepatide Darcel Bayley) 10 MG/0.5ML Pen Reorder     Start the following medications: Meds ordered this encounter  Medications   tirzepatide (MOUNJARO) 10 MG/0.5ML Pen    Sig: Inject 10 mg into the skin once a week.    Dispense:  2 mL    Refill:  2     Follow up: 4-6 months or sooner if need be  Should you have any questions or concerns please call the internal medicine clinic at [phone].    Sanjuana Letters, D.O. [place]

## 2022-02-20 NOTE — Progress Notes (Signed)
CC: Follow up T2DM, HTN, HLD  HPI:  Kristen Frost is a 67 y.o. female living with a history stated below and presents today for her bi-annual follow up to discuss her chronic medical conditions and recent passing of her husband. Please see problem based assessment and plan for additional details.  Past Medical History:  Diagnosis Date   Anxiety    Arthritis    Bleeding hemorrhoids    grade 2   Diverticulosis of colon    Full dentures    History of colon polyps    Hyperlipidemia    Hypertension    followed by pcp   (04-30-2019  per pt had stress test approx. 2005, was told normal (not available in epic)   IDA (iron deficiency anemia)    OSA on CPAP    followed by dr Halford Chessman ([facility])    (04-30-2019  pt  stated uses every night)   Restless leg syndrome    Type 2 diabetes mellitus ([place])    followed by pcp   (04-30-2019  pt stated does not check blood sugar at home)    Current Outpatient Medications on File Prior to Visit  Medication Sig Dispense Refill   amLODipine (NORVASC) 10 MG tablet TAKE 1 TABLET BY MOUTH DAILY 90 tablet 3   atorvastatin (LIPITOR) 40 MG tablet Take 1 tablet (40 mg total) by mouth daily. 90 tablet 3   cetirizine (ZYRTEC ALLERGY) 10 MG tablet Take 1 tablet (10 mg total) by mouth daily. 90 tablet 3   Cholecalciferol (VITAMIN D) 2000 units CAPS Take 1 capsule (2,000 Units total) by mouth daily. (Patient taking differently: Take 2,000 Units by mouth daily.) 90 capsule 1   diclofenac Sodium (VOLTAREN) 1 % GEL Apply 4 g topically 4 (four) times daily. 50 g 1   ferrous sulfate 325 (65 FE) MG tablet Take 1 tablet (325 mg total) by mouth daily with breakfast. (Patient taking differently: Take 325 mg by mouth daily with breakfast. ) 90 tablet 1   fluticasone (FLONASE) 50 MCG/ACT nasal spray SHAKE LIQUID AND USE 1 SPRAY IN EACH NOSTRIL DAILY AS NEEDED 16 g 3   Insulin Pen Needle (PEN NEEDLES) 31G X 5 MM MISC 1 application by Does not apply route daily. 90  each 1   No current facility-administered medications on file prior to visit.    Family History  Problem Relation Age of Onset   Allergies Father    Alcohol abuse Father    Tuberculosis Father        deceased from TB   Allergies Other        sibling   Asthma Other        sibling   Hypertension Other        sibling   Migraines Other        sibling   Rashes / Skin problems Other        sibling   Gout Other        sibling    Social History   Socioeconomic History   Marital status: Married    Spouse name: Not on file   Number of children: Not on file   Years of education: Not on file   Highest education level: Not on file  Occupational History   Occupation: unemployeed  Tobacco Use   Smoking status: Never   Smokeless tobacco: Never  Vaping Use   Vaping Use: Never used  Substance and Sexual Activity   Alcohol use: No  Drug use: Never   Sexual activity: Not on file  Other Topics Concern   Not on file  Social History Narrative   Not on file   Social Determinants of Health   Financial Resource Strain: Low Risk  (02/20/2022)   Overall Financial Resource Strain (CARDIA)    Difficulty of Paying Living Expenses: Not very hard  Food Insecurity: No Food Insecurity (02/20/2022)   Hunger Vital Sign    Worried About Running Out of Food in the Last Year: Never true    Ran Out of Food in the Last Year: Never true  Transportation Needs: No Transportation Needs (02/20/2022)   PRAPARE - Hydrologist (Medical): No    Lack of Transportation (Non-Medical): No  Physical Activity: Sufficiently Active (08/14/2021)   Exercise Vital Sign    Days of Exercise per Week: 4 days    Minutes of Exercise per Session: 50 min  Stress: No Stress Concern Present (08/14/2021)   [place]    Feeling of Stress : Not at all  Social Connections: Moderately Integrated (02/20/2022)   Social Connection and  Isolation Panel [NHANES]    Frequency of Communication with Friends and Family: More than three times a week    Frequency of Social Gatherings with Friends and Family: More than three times a week    Attends Religious Services: More than 4 times per year    Active Member of [employer] or Organizations: Yes    Attends Archivist Meetings: More than 4 times per year    Marital Status: Widowed  Intimate Partner Violence: Not At Risk (02/20/2022)   Humiliation, Afraid, Rape, and Kick questionnaire    Fear of Current or Ex-Partner: No    Emotionally Abused: No    Physically Abused: No    Sexually Abused: No    Review of Systems: ROS negative except for what is noted on the assessment and plan.  Vitals:   02/20/22 0852 02/20/22 0920  BP: 134/66 135/68  Pulse: 92 82  Temp: 98.3 F (36.8 C)   TempSrc: Oral   SpO2: 98% 97%  Weight: 236 lb 6.4 oz (107.2 kg)   Height: 5' 1.5" (1.562 m)     Physical Exam: Constitutional: no acute distress HENT: normocephalic atraumatic Neck: supple Cardiovascular: regular rate and rhythm, no m/r/g Pulmonary/Chest: normal work of breathing on room air MSK: normal bulk and tone Neurological: alert & oriented x 3, normal sensation in feet.  Skin: warm and dry. Feet without open wounds or sores Psych: Normal mood  Assessment & Plan:   HTN (hypertension) BP slightly above goal, 135/68 on initial and repeat check. Current regimen of amlodipine 10 mg. Goal would be systolic 269'S. She has a home cuff, instructed on how to properly take blood pressure and if above goal she will reach out to discuss addition of arb. Continue amlodipine 10 mg and add ARB if continues to be elevated.  Premature atrial beat Regular rate and rhythm today. Denies chest pain or palpitations. No further intervention needed  OSA (obstructive sleep apnea) Continues to follow with Dr. Halford Chessman annually, wears her CPAP nightly.   T2DM (type 2 diabetes mellitus) ([facility]) A1c today of  6.1% from 5.8%. She remains in goal on tirzepatide 10 mg weekly. She has lost 8 lbs since her last visit and continues to eat a healthy diet and will return to the senior center for exercising. microalbumin/Cr urine normal 6 months ago  Foot exam today without open sores or wounds and she has normal sensation. She has an upcoming ophthalmology appointment. Will continue mounjaro 10 mg weekly and repeat A1c in 6 months, if under 7% can extend to annually.   Sensorineural hearing loss (SNHL) of right ear Hearing has improved and she continues to use debrox. Has follow up with ENT 1/16.  CKD (chronic kidney disease) stage 2, GFR 60-89 ml/min BMP at next visit in 6 months.   Anxiety Hx of anxiety that has been exacerbated recently secondary to the recent passing of her husband. She is interested in meeting with Wyatt Portela, counselor with [facility]. Will place new referral. She is not interested in addition of medications at this time. PHQ-9 of 0.  Dyslipidemia Prior lipid panel with LDL 128 and at this time she was not taking her statin therapy. She has been adherent recently and we will recheck lipid panel today. Goal LDL is under 100 for primary prevention w/ her history of T2DM  Healthcare maintenance Given secondary pneumonia vaccine today (p-20, given 13 in the past). She will follow up with her pharmacy for her Tdap. She continues to decline COVID and flu vaccines. She states she has received both shingrix vaccines a few months ago at [facility] however on discussion with the pharmacy they note she has not received it from them. She will need to check with [facility] to see if she received elsewhere.   Vitamin D deficiency Continues to take multivitamins and vitamin D daily. Discussed bringing supplements in to next appointment to review together.   Seasonal allergies Uses flonase PRN  Bereavement Patient's husband passed away this past year from liver cancer. She has been handling his loss well but has been  having increasing anxiety associated with family members being involved. She feels as though this is an opportunity to focus on herself and with her increasing episodes of anxiety she would like to meet with Bianca,counselor in [facility].  Patient discussed with Dr. Newell Coral, D.O. [facility], PGY-3 Phone: [phone] Date 02/20/2022 Time 10:16 AM

## 2022-02-20 NOTE — Assessment & Plan Note (Signed)
Hearing has improved and she continues to use debrox. Has follow up with ENT 1/16.

## 2022-02-20 NOTE — Assessment & Plan Note (Signed)
Continues to follow with Dr. Halford Chessman annually, wears her CPAP nightly.

## 2022-02-20 NOTE — Assessment & Plan Note (Signed)
Continues to take multivitamins and vitamin D daily. Discussed bringing supplements in to next appointment to review together.

## 2022-02-20 NOTE — Assessment & Plan Note (Signed)
BMP at next visit in 6 months.

## 2022-02-20 NOTE — Assessment & Plan Note (Addendum)
Hx of anxiety that has been exacerbated recently secondary to the recent passing of her husband. She is interested in meeting with Wyatt Portela, counselor with [facility]. Will place new referral. She is not interested in addition of medications at this time. PHQ-9 of 0.

## 2022-02-20 NOTE — Assessment & Plan Note (Addendum)
BP slightly above goal, 135/68 on initial and repeat check. Current regimen of amlodipine 10 mg. Goal would be systolic 390'Z. She has a home cuff, instructed on how to properly take blood pressure and if above goal she will reach out to discuss addition of arb. Continue amlodipine 10 mg and add ARB if continues to be elevated.

## 2022-02-20 NOTE — Assessment & Plan Note (Signed)
Uses flonase PRN

## 2022-02-20 NOTE — Assessment & Plan Note (Signed)
Regular rate and rhythm today. Denies chest pain or palpitations. No further intervention needed

## 2022-02-20 NOTE — Assessment & Plan Note (Addendum)
A1c today of 6.1% from 5.8%. She remains in goal on tirzepatide 10 mg weekly. She has lost 8 lbs since her last visit and continues to eat a healthy diet and will return to the senior center for exercising. microalbumin/Cr urine normal 6 months ago Foot exam today without open sores or wounds and she has normal sensation. She has an upcoming ophthalmology appointment. Will continue mounjaro 10 mg weekly and repeat A1c in 6 months, if under 7% can extend to annually.

## 2022-02-20 NOTE — Assessment & Plan Note (Addendum)
Given secondary pneumonia vaccine today (p-20, given 13 in the past). She will follow up with her pharmacy for her Tdap. She continues to decline COVID and flu vaccines. She states she has received both shingrix vaccines a few months ago at [facility] however on discussion with the pharmacy they note she has not received it from them. She will need to check with [facility] to see if she received elsewhere.

## 2022-02-20 NOTE — Assessment & Plan Note (Signed)
Prior lipid panel with LDL 128 and at this time she was not taking her statin therapy. She has been adherent recently and we will recheck lipid panel today. Goal LDL is under 100 for primary prevention w/ her history of T2DM  Addendum: LDL 90, at goal for primary prevention. Can repeat in 3-5 years

## 2022-02-20 NOTE — Assessment & Plan Note (Signed)
Patient's husband passed away this past year from liver cancer. She has been handling his loss well but has been having increasing anxiety associated with family members being involved. She feels as though this is an opportunity to focus on herself and with her increasing episodes of anxiety she would like to meet with Bianca,counselor in [facility].

## 2022-02-21 ENCOUNTER — Encounter: Payer: Self-pay | Admitting: Student

## 2022-02-21 LAB — LIPID PANEL
Chol/HDL Ratio: 2.7 ratio (ref 0.0–4.4)
Cholesterol, Total: 165 mg/dL (ref 100–199)
HDL: 61 mg/dL (ref >39)
LDL Chol Calc (NIH): 90 mg/dL (ref 0–99)
Triglycerides: 72 mg/dL (ref 0–149)
VLDL Cholesterol Cal: 14 mg/dL (ref 5–40)

## 2022-02-23 NOTE — Progress Notes (Signed)
Internal Medicine Clinic Attending  Case discussed with Dr. [name]  At the time of the visit.  We reviewed the resident's history and exam and pertinent patient test results.  I agree with the assessment, diagnosis, and plan of care documented in the resident's note.  

## 2022-02-28 ENCOUNTER — Other Ambulatory Visit: Payer: Self-pay | Admitting: Student

## 2022-02-28 DIAGNOSIS — R0981 Nasal congestion: Secondary | ICD-10-CM

## 2022-04-09 ENCOUNTER — Encounter: Payer: Self-pay | Admitting: Student

## 2022-04-09 ENCOUNTER — Ambulatory Visit (INDEPENDENT_AMBULATORY_CARE_PROVIDER_SITE_OTHER): Payer: Medicare (Managed Care) | Admitting: Student

## 2022-04-09 VITALS — BP 138/68 | HR 92 | Temp 98.6°F | Wt 232.4 lb

## 2022-04-09 DIAGNOSIS — I10 Essential (primary) hypertension: Secondary | ICD-10-CM

## 2022-04-09 DIAGNOSIS — M545 Low back pain, unspecified: Secondary | ICD-10-CM | POA: Diagnosis not present

## 2022-04-09 DIAGNOSIS — M549 Dorsalgia, unspecified: Secondary | ICD-10-CM | POA: Insufficient documentation

## 2022-04-09 DIAGNOSIS — E119 Type 2 diabetes mellitus without complications: Secondary | ICD-10-CM

## 2022-04-09 DIAGNOSIS — R748 Abnormal levels of other serum enzymes: Secondary | ICD-10-CM | POA: Diagnosis not present

## 2022-04-09 NOTE — Assessment & Plan Note (Signed)
Patient did have previously elevated alkaline phosphatase level, possibly 2/2 vitamin D deficiency. Today we will follow-up with repeat CMP given last LFT's were 1.5y ago.  - Follow-up CMP

## 2022-04-09 NOTE — Patient Instructions (Signed)
[name], it was a pleasure seeing you today!  Today we discussed: - I think your pain is mostly due to some inflammation in your muscles from exercising. Keep exercising and stretching as you have been. If you need medication, I would try some Tylenol first. We will check lab work today as well.   I have ordered the following labs today:   Lab Orders         CMP14 + Anion Gap      Follow-up: 4-6 months   Please make sure to arrive 15 minutes prior to your next appointment. If you arrive late, you may be asked to reschedule.   We look forward to seeing you next time. Please call our clinic at [phone] if you have any questions or concerns. The best time to call is Monday-Friday from 9am-4pm, but there is someone available 24/7. If after hours or the weekend, call the main hospital number and ask for the Internal Medicine Resident On-Call. If you need medication refills, please notify your pharmacy one week in advance and they will send [place] a request.  Thank you for letting us take part in your care. Wishing you the best!  Thank you, Sanjuan Dame, MD

## 2022-04-09 NOTE — Assessment & Plan Note (Signed)
Kristen Frost is presenting to clinic today to discuss lower back pain. She states the pain started around one a half weeks ago after she increased her exercise regimen to four times weekly. Describes the pain as soreness without any radiation. Typically comes on intermittently and randomly, not necessarily with a specific movement. Does note that stretching tends to improve her symptoms. She denies any change in functional status due to this or requiring any medications to treat the pain. Given her husband recently passed away, she wanted to make sure there was nothing underlying. She denies any fever, chills, radiculopathy, chest pain, dyspnea, palpitations, nausea, vomiting, abdominal pain, changes in bowel movements, dysuria, polyuria.   Most likely Kristen Frost's pain is musculoskeletal due to increase in exercise. She does not have any red flag symptoms, the pain is not functionally limiting, and is well-controlled without medication. Discussed continuing to exercise as tolerated, mentioned she can use Tylenol as needed for the pain.  - Tylenol as needed for the pain - Return to clinic if symptoms worsen

## 2022-04-09 NOTE — Progress Notes (Signed)
   CC: back pain  HPI:  [name] is a 67 y.o. person with medical history as below presenting to [facility] for back pain.  Please see problem-based list for further details, assessments, and plans.  Past Medical History:  Diagnosis Date   Anxiety    Arthritis    Bleeding hemorrhoids    grade 2   Diverticulosis of colon    Full dentures    History of colon polyps    Hyperlipidemia    Hypertension    followed by pcp   (04-30-2019  per pt had stress test approx. 2005, was told normal (not available in epic)   IDA (iron deficiency anemia)    OSA on CPAP    followed by dr Kristen Frost ([place] [facility])    (04-30-2019  pt  stated uses every night)   Restless leg syndrome    Type 2 diabetes mellitus ([place])    followed by pcp   (04-30-2019  pt stated does not check blood sugar at home)   Review of Systems:  As per HPI  Physical Exam:  Vitals:   04/09/22 1357  BP: 138/68  Pulse: 92  Temp: 98.6 F (37 C)  TempSrc: Oral  SpO2: 99%  Weight: 232 lb 6.4 oz (105.4 kg)   General: Resting in chair in no acute distress CV: Regular rate, rhythm. No murmurs appreciated. 2+ radial pulses bilaterally. Pulm: Normal work of breathing on room air. Clear to auscultation bilaterally. GI: Abdomen soft, non-tender, non-distended. Normoactive bowel sounds. MSK: Normal bulk, tone. Mild tenderness to palpation in R lower back. No spinal or paraspinal tenderness throughout. Normal active hip and shoulder range of motion.  Neuro: Awake, alert, conversing appropriately. Grossly non-focal. Psych: Normal mood, affect, speech.   Assessment & Plan:   Low back pain Kristen Frost is presenting to clinic today to discuss lower back pain. She states the pain started around one a half weeks ago after she increased her exercise regimen to four times weekly. Describes the pain as soreness without any radiation. Typically comes on intermittently and randomly, not necessarily with a specific movement. Does note  that stretching tends to improve her symptoms. She denies any change in functional status due to this or requiring any medications to treat the pain. Given her husband recently passed away, she wanted to make sure there was nothing underlying. She denies any fever, chills, radiculopathy, chest pain, dyspnea, palpitations, nausea, vomiting, abdominal pain, changes in bowel movements, dysuria, polyuria.   Most likely Kristen Frost's pain is musculoskeletal due to increase in exercise. She does not have any red flag symptoms, the pain is not functionally limiting, and is well-controlled without medication. Discussed continuing to exercise as tolerated, mentioned she can use Tylenol as needed for the pain.  - Tylenol as needed for the pain - Return to clinic if symptoms worsen  Elevated alkaline phosphatase level Patient did have previously elevated alkaline phosphatase level, possibly 2/2 vitamin D deficiency. Today we will follow-up with repeat CMP given last LFT's were 1.5y ago.  - Follow-up CMP   Patient discussed with Dr. Denita Lung, MD Internal Medicine PGY-3 Pager: [phone]

## 2022-04-10 ENCOUNTER — Encounter: Payer: Self-pay | Admitting: Student

## 2022-04-10 LAB — CMP14 + ANION GAP
ALT: 15 IU/L (ref 0–32)
AST: 22 IU/L (ref 0–40)
Albumin/Globulin Ratio: 1.5 (ref 1.2–2.2)
Albumin: 4.4 g/dL (ref 3.9–4.9)
Alkaline Phosphatase: 105 IU/L (ref 44–121)
Anion Gap: 16 mmol/L (ref 10.0–18.0)
BUN/Creatinine Ratio: 12 (ref 12–28)
BUN: 13 mg/dL (ref 8–27)
Bilirubin Total: 0.3 mg/dL (ref 0.0–1.2)
CO2: 22 mmol/L (ref 20–29)
Calcium: 9.8 mg/dL (ref 8.7–10.3)
Chloride: 105 mmol/L (ref 96–106)
Creatinine, Ser: 1.05 mg/dL — ABNORMAL HIGH (ref 0.57–1.00)
Globulin, Total: 2.9 g/dL (ref 1.5–4.5)
Glucose: 88 mg/dL (ref 70–99)
Potassium: 4.1 mmol/L (ref 3.5–5.2)
Sodium: 143 mmol/L (ref 134–144)
Total Protein: 7.3 g/dL (ref 6.0–8.5)
eGFR: 59 mL/min/{1.73_m2} — ABNORMAL LOW (ref >59)

## 2022-04-11 NOTE — Progress Notes (Signed)
Internal Medicine Clinic Attending  Case discussed with the resident at the time of the visit.  We reviewed the resident's history and exam and pertinent patient test results.  I agree with the assessment, diagnosis, and plan of care documented in the resident's note.  

## 2022-04-23 ENCOUNTER — Other Ambulatory Visit (HOSPITAL_COMMUNITY): Payer: Self-pay

## 2022-05-01 ENCOUNTER — Ambulatory Visit (INDEPENDENT_AMBULATORY_CARE_PROVIDER_SITE_OTHER): Payer: Medicare (Managed Care) | Admitting: Licensed Clinical Social Worker

## 2022-05-01 DIAGNOSIS — F419 Anxiety disorder, unspecified: Secondary | ICD-10-CM

## 2022-05-01 NOTE — BH Specialist Note (Signed)
Patient no-showed today's appointment; appointment was for Telephone visit at 10:30am  [facility] from here on out)  attempted patient twice via Telephone number [phone]    [facility] contacted patient from telephone number [phone]. [place] left a  VM.   Patient will need to reschedule appointment by calling Internal medicine center [phone].  Update Patient contact [facility] back and requested appointment to be rescheduled to 04/23 due to patient currently being in class and surgeries coming up.  Milus Height, MSW, [place]  Internal Medicine Center Direct Dial:[phone]  Fax [phone] Main Office Phone: [phone] [address] [place], [place] [address] Website: [place]

## 2022-05-08 ENCOUNTER — Ambulatory Visit (INDEPENDENT_AMBULATORY_CARE_PROVIDER_SITE_OTHER): Payer: Medicare (Managed Care) | Admitting: Student

## 2022-05-08 ENCOUNTER — Other Ambulatory Visit (HOSPITAL_COMMUNITY): Payer: Self-pay

## 2022-05-08 VITALS — BP 138/68 | HR 80 | Temp 97.8°F | Ht 61.0 in | Wt 233.2 lb

## 2022-05-08 DIAGNOSIS — M549 Dorsalgia, unspecified: Secondary | ICD-10-CM | POA: Diagnosis not present

## 2022-05-08 DIAGNOSIS — N644 Mastodynia: Secondary | ICD-10-CM | POA: Diagnosis not present

## 2022-05-08 MED ORDER — IBUPROFEN 200 MG PO TABS
400.0000 mg | ORAL_TABLET | Freq: Four times a day (QID) | ORAL | 0 refills | Status: AC | PRN
  Filled 2022-05-08: qty 20, 3d supply, fill #0

## 2022-05-08 NOTE — Assessment & Plan Note (Addendum)
Pt complaining of soreness localized to the lateral right breast that began 2 days ago. States the pain was intermittent and mild at first but has gotten progressively more intense and constant. Pain was the worst yesterday rated 10 out of 10 in intensity. States symptoms have started to get better today, but are still present. Pain is made worse with movements such as turning her body to the right or reaching her right had behind her back. Symptoms are better when patient is laying on her right side. She denies any recent falls, trauma. States she did lift a pack of water bottles the other day, but this was after the pain started. Denies fever, chills. Denies overlying skin changes, nipple discharge/bleeding, or skin dimpling. She had a screening mammogram 9 months ago which was normal. Exam revealed no obvious overlying skin changes. On palpation, there was tenderness as well as mild localized bulge of the right lateral breast just beneath the axilla when compared to the left. Will obtain diagnostic mammogram to rule out malignancy or lymph node abnormalities. Given Voltaren has seemed to help patient with the pain, will encourage continued use and provide oral NSAIDs for symptomatic control. Will follow up with patient in April.  > diagnostic mammography > ibuprofen 600 mg > Voltaren gel > follow up in April

## 2022-05-08 NOTE — Assessment & Plan Note (Signed)
Pt reports she began having pain of the right upper back just medial to the right scapula 2 days ago. She described the pain as sharp and non-radiating. States symptoms were initially intermittent, but became more constant over the next few days. Pain was 10 out of 10 yesterday, but became slightly better today. She denies recent trauma, falls. Denies numbness, tingling, weakness, bladder/bowel incontinence or saddle anesthesia. She did have some relief with Voltaren gel. Pain is worse with certain movements such as turning her body to the right. She does have point tenderness just medial to the right scapula on exam. Given presentation and location, suspect likely MSK in nature.   > Voltaren gel > ibuprofen 600 mg

## 2022-05-08 NOTE — Patient Instructions (Addendum)
Today we discussed the following:  1) Right breast pain and back pain Please begin taking ibuprofen 600 mg every 6 hours as needed for pain. You may also continue to use the Voltaren gel. We will schedule you for a diagnostic mammogram.

## 2022-05-08 NOTE — Progress Notes (Signed)
Attestation for Student Documentation:  I personally was present and performed or re-performed the history, physical exam and medical decision-making activities of this service and have verified that the service and findings are accurately documented in the student's note.  In short, Kristen Frost is a 67 y/o person living with a history of well controlled diabetes who presents with acute pain in her right lateral breast and right upper back. She denies any evidence of rashes, breast/nipple changes, breast discharge or bleeding. No prior history of malignancy. During our examination she did have a palpable mass in the right lateral breast that was tender. While the tenderness would make malignancy lower on my differential, we will go ahead with diagnostic mammography. Last mammogram a few months ago was benign. Low suspicion for shingles as pain describes as a soreness and without pain in dermatomal pattern.   Her back pain seems more consistent with an MSK strain (paraspinal, rhomboid or teres). Encouraged voltaren gel use and short course of ibuprofen. Will follow up in one month. She was instructed to follow up sooner if any new symptoms.   Nursing staff Kristen Frost present for breast exam.   [name], MD 05/08/2022, 10:44 PM

## 2022-05-08 NOTE — Progress Notes (Signed)
Subjective:   Patient ID: Kristen Frost female   DOB: 11/06/1955 67 y.o.   MRN: [id number]  HPI: KristenShanautica Drye Frost is a 67 y.o. female with a PMHx as listed above who present for evaluation of right side pain. Please see problem based assessment and plan for further details.   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 01/18/2021   Anxiety 12/19/2020   Seasonal allergies 07/19/2020   Sensorineural hearing loss (SNHL) of right ear 04/08/2020   CKD (chronic kidney disease) stage 2, GFR 60-89 ml/min 08/09/2016   Healthcare maintenance 05/17/2016   Vitamin D deficiency 03/09/2016   Dyslipidemia 03/02/2016   Class 3 obesity ([place]) 03/01/2016   T2DM (type 2 diabetes mellitus) ([place]) 03/01/2016   HTN (hypertension) 11/17/2015   OSA (obstructive sleep apnea) 12/04/2011   RLS (restless legs syndrome) 12/04/2011     Current Outpatient Medications  Medication Sig Dispense Refill   ibuprofen (ADVIL) 200 MG tablet Take 2 tablets (400 mg total) by mouth every 6 (six) hours as needed for up to 5 days. 20 tablet 0   amLODipine (NORVASC) 10 MG tablet TAKE 1 TABLET BY MOUTH DAILY 90 tablet 3   atorvastatin (LIPITOR) 40 MG tablet Take 1 tablet (40 mg total) by mouth daily. 90 tablet 3   cetirizine (ZYRTEC ALLERGY) 10 MG tablet Take 1 tablet (10 mg total) by mouth daily. 90 tablet 3   Cholecalciferol (VITAMIN D) 2000 units CAPS Take 1 capsule (2,000 Units total) by mouth daily. (Patient taking differently: Take 2,000 Units by mouth daily.) 90 capsule 1   diclofenac Sodium (VOLTAREN) 1 % GEL Apply 4 g topically 4 (four) times daily. 50 g 1   ferrous sulfate 325 (65 FE) MG tablet Take 1 tablet (325 mg total) by mouth daily with breakfast. (Patient taking differently:  Take 325 mg by mouth daily with breakfast. ) 90 tablet 1   fluticasone (FLONASE) 50 MCG/ACT nasal spray SHAKE LIQUID AND USE 1 SPRAY IN EACH NOSTRIL DAILY AS NEEDED 16 g 3   Insulin Pen Needle (PEN NEEDLES) 31G X 5 MM MISC 1 application by Does not apply route daily. 90 each 1   tirzepatide (MOUNJARO) 10 MG/0.5ML Pen Inject 10 mg into the skin once a week. 2 mL 2   No current facility-administered medications for this visit.     Review of Systems: Pertinent ROS present in the assessment and plan below. Otherwise negative.   Objective:   Physical Exam: Vitals:   05/08/22 1510  BP: 138/68  Pulse: 80  Temp: 97.8 F (36.6 C)  TempSrc: Oral  SpO2: 100%  Weight: 233 lb 3.2 oz (105.8 kg)  Height: 5\' 1"  (1.549 m)   Constitutional: no acute distress HENT: normocephalic atraumatic Neck: supple Cardiovascular: regular rate and rhythm, no m/r/g Pulmonary/Chest: normal work of breathing on room air MSK: normal bulk and tone. Point tenderness to palpation of the right upper back just medial to the right scapula with no overlying skin changes  Breast: Point tenderness to the right lateral breast just beneath the right axilla. There does seem to be a small bulge in this region on palpation when compared to the left. There is no breast dimpling, nipple discharge/bleeding or overlying skin changes to the  bilateral breast.  Neurological: alert & oriented x 3 Skin: warm and dry Psych: Normal mood  Assessment & Plan:   Breast pain, right Pt complaining of soreness localized to the lateral right breast that began 2 days ago. States the pain was intermittent and mild at first but has gotten progressively more intense and constant. Pain was the worst yesterday rated 10 out of 10 in intensity. States symptoms have started to get better today, but are still present. Pain is made worse with movements such as turning her body to the right or reaching her right had behind her back. Symptoms are better  when patient is laying on her right side. She denies any recent falls, trauma. States she did lift a pack of water bottles the other day, but this was after the pain started. Denies fever, chills. Denies overlying skin changes, nipple discharge/bleeding, or skin dimpling. She had a screening mammogram 9 months ago which was normal. Exam revealed no obvious overlying skin changes. On palpation, there was tenderness as well as mild localized bulge of the right lateral breast just beneath the axilla when compared to the left. Will obtain diagnostic mammogram to rule out malignancy or lymph node abnormalities. Given Voltaren has seemed to help patient with the pain, will encourage continued use and provide oral NSAIDs for symptomatic control. Will follow up with patient in April.  > diagnostic mammography > ibuprofen 600 mg > Voltaren gel > follow up in April   Upper back pain on right side Pt reports she began having pain of the right upper back just medial to the right scapula 2 days ago. She described the pain as sharp and non-radiating. States symptoms were initially intermittent, but became more constant over the next few days. Pain was 10 out of 10 yesterday, but became slightly better today. She denies recent trauma, falls. Denies numbness, tingling, weakness, bladder/bowel incontinence or saddle anesthesia. She did have some relief with Voltaren gel. Pain is worse with certain movements such as turning her body to the right. She does have point tenderness just medial to the right scapula on exam. Given presentation and location, suspect likely MSK in nature.   > Voltaren gel > ibuprofen 600 mg

## 2022-05-09 ENCOUNTER — Other Ambulatory Visit (HOSPITAL_COMMUNITY): Payer: Self-pay

## 2022-05-09 ENCOUNTER — Telehealth: Payer: Self-pay | Admitting: *Deleted

## 2022-05-09 MED ORDER — VALACYCLOVIR HCL 1 G PO TABS
1000.0000 mg | ORAL_TABLET | Freq: Three times a day (TID) | ORAL | 0 refills | Status: AC
  Filled 2022-05-09: qty 21, 7d supply, fill #0

## 2022-05-09 NOTE — Telephone Encounter (Signed)
Call from pt who stated she saw Dr Johnney Ou yesterday and was told to call the office back if she develops a rash. Stated she woke up this am with a rash on her back; red bumps, no c/o itching,.

## 2022-05-09 NOTE — Addendum Note (Signed)
Addended by: [name] on: 05/09/2022 09:29 AM   Modules accepted: Orders

## 2022-05-09 NOTE — Progress Notes (Signed)
Addendum: Patient called in to clinic today with evidence of rash on her mid axilla. Please see media tab, starting to have formation of multiple vesicles consistent with acute herpes zoster. She has not received the vaccine in the past.   Will prescribe valacyclovir 1000 mg TID for 7 days and have her follow up with a telehealth to assure resolution

## 2022-05-09 NOTE — Addendum Note (Signed)
Addended by: [name] on: 05/09/2022 07:07 PM   Modules accepted: Orders

## 2022-05-09 NOTE — Telephone Encounter (Signed)
Dr Johnney Ou would like pt to send a picture via My Chart. Pt was called - stated she's at the [place] and she can come by our office. Dr Johnney Ou stated this will be ok.

## 2022-05-09 NOTE — Progress Notes (Signed)
Internal Medicine Clinic Attending  Case discussed with Dr. Johnney Ou  At the time of the visit.  We reviewed the resident's history and exam and pertinent patient test results.  I agree with the assessment, diagnosis, and plan of care documented in the resident's note.     Yesterday, at the time this note was written, patient had no rash, but had pain in a dermatomal distribution. Today, she called in with a vesicular rash in this same area. Her presentation is consistent with Zoster, and I agree with Dr. Marthann Schiller treatment plan of Valtrex.

## 2022-05-18 ENCOUNTER — Other Ambulatory Visit: Payer: Self-pay | Admitting: Student

## 2022-05-18 ENCOUNTER — Other Ambulatory Visit (HOSPITAL_COMMUNITY): Payer: Self-pay

## 2022-05-18 DIAGNOSIS — E785 Hyperlipidemia, unspecified: Secondary | ICD-10-CM

## 2022-05-18 DIAGNOSIS — E119 Type 2 diabetes mellitus without complications: Secondary | ICD-10-CM

## 2022-05-21 HISTORY — PX: EYE SURGERY: SHX253

## 2022-05-22 ENCOUNTER — Other Ambulatory Visit (HOSPITAL_COMMUNITY): Payer: Self-pay

## 2022-05-22 MED ORDER — MOUNJARO 10 MG/0.5ML ~~LOC~~ SOAJ
10.0000 mg | SUBCUTANEOUS | 2 refills | Status: DC
  Filled 2022-05-22: qty 2, 28d supply, fill #0
  Filled 2022-06-14: qty 2, 28d supply, fill #1
  Filled 2022-07-12: qty 2, 28d supply, fill #2

## 2022-06-08 ENCOUNTER — Ambulatory Visit (INDEPENDENT_AMBULATORY_CARE_PROVIDER_SITE_OTHER): Payer: Medicare (Managed Care)

## 2022-06-08 VITALS — BP 122/57 | HR 79 | Wt 230.0 lb

## 2022-06-08 DIAGNOSIS — Z8619 Personal history of other infectious and parasitic diseases: Secondary | ICD-10-CM

## 2022-06-08 NOTE — Progress Notes (Signed)
Internal Medicine Clinic Attending  Case discussed with Dr. White  At the time of the visit.  We reviewed the resident's history and exam and pertinent patient test results.  I agree with the assessment, diagnosis, and plan of care documented in the resident's note.  

## 2022-06-08 NOTE — Progress Notes (Signed)
   CC: Follow-up back pain  HPI:  Ms.Lexine Drye Baglio is a 67 y.o. with past medical history as below who presents for back pain, 1 month follow-up.  See detailed assessment and plan for HPI.  Past Medical History:  Diagnosis Date   Anxiety    Arthritis    Bleeding hemorrhoids    grade 2   Diverticulosis of colon    Full dentures    History of colon polyps    Hyperlipidemia    Hypertension    followed by pcp   (04-30-2019  per pt had stress test approx. 2005, was told normal (not available in epic)   IDA (iron deficiency anemia)    OSA on CPAP    followed by dr Craige Cotta ([facility] [facility])    (04-30-2019  pt  stated uses every night)   Restless leg syndrome    Type 2 diabetes mellitus    followed by pcp   (04-30-2019  pt stated does not check blood sugar at home)   Review of Systems: Please see detailed assessment plan for pertinent ROS.  Physical Exam:  Vitals:   06/08/22 0851  BP: (!) 122/57  Pulse: 79  SpO2: 100%  Weight: 230 lb (104.3 kg)   Physical Exam Constitutional:      General: She is not in acute distress. HENT:     Head: Normocephalic and atraumatic.  Eyes:     Extraocular Movements: Extraocular movements intact.  Cardiovascular:     Heart sounds: No murmur heard. Pulmonary:     Effort: Pulmonary effort is normal.  Musculoskeletal:     Cervical back: Neck supple.  Skin:    General: Skin is warm and dry.     Comments: Small grouping of punctate granulation tissue at right lateral chest/midaxillary line and also right mid back at about the area of the rhomboid muscles. No tenderness, erythema, or vesicular lesions.  Neurological:     Mental Status: She is alert and oriented to person, place, and time.  Psychiatric:        Mood and Affect: Mood normal.        Behavior: Behavior normal.      Assessment & Plan:   See Encounters Tab for problem based charting.  History of shingles Patient presents after shingles rash appeared several weeks ago.  Initially presented for lateral breast pain and back pain, both in same dermatomal distribution and not crossing midline. Following day, developed vesicular rash (see media). Her symptoms have fully resolved with only the use of some ibuprofen early in course of rash. Exam with resolution of inflammation today. -encouraged shingles vaccine at pharmacy  Patient discussed with Dr. Oswaldo Done

## 2022-06-08 NOTE — Assessment & Plan Note (Signed)
Patient presents after shingles rash appeared several weeks ago. Initially presented for lateral breast pain and back pain, both in same dermatomal distribution and not crossing midline. Following day, developed vesicular rash (see media). Her symptoms have fully resolved with only the use of some ibuprofen early in course of rash. Exam with resolution of inflammation today. -encouraged shingles vaccine at pharmacy

## 2022-06-12 ENCOUNTER — Ambulatory Visit: Payer: Self-pay | Admitting: Licensed Clinical Social Worker

## 2022-06-12 DIAGNOSIS — F419 Anxiety disorder, unspecified: Secondary | ICD-10-CM

## 2022-06-12 NOTE — BH Specialist Note (Signed)
Integrated Behavioral Health via Telemedicine Visit  06/12/2022 Kristen Frost [id number]  Number of Integrated Behavioral Health Clinician visits: Initial Session Start time: 1030   Session End time: 1130  Total time in minutes: 60   Referring Provider: Yevonne Aline Patient/Family location: Home [facility] Provider location: Office All persons participating in visit: Patient and [facility] Types of Service: Individual psychotherapy and General Behavioral Integrated Care (BHI)  I connected with Kristen Frost a via  Telephone or Video Enabled Telemedicine Application  (Video is [employer], mining) and verified that I am speaking with the correct person using two identifiers. Discussed confidentiality: Yes   I discussed the limitations of telemedicine and the availability of in person appointments.  Discussed there is a possibility of technology failure and discussed alternative modes of communication if that failure occurs.  I discussed that engaging in this telemedicine visit, they consent to the provision of behavioral healthcare.  Patient and/or legal guardian expressed understanding and consented to Telemedicine visit: Yes   Presenting Concerns: Patient and/or family reports the following symptoms/concerns: Grief from passing of spouse Duration of problem: Less than a year; Severity of problem: severe  Patient and/or Family's Strengths/Protective Factors: Sense of purpose  Goals Addressed: Patient will:  Reduce symptoms of:  Grief     Progress towards Goals: Ongoing  Interventions: Interventions utilized:  Motivational Interviewing, Solution-Focused Strategies, and Supportive Counseling Standardized Assessments completed: PHQ-SADS     06/08/2022    9:20 AM 06/08/2022    9:19 AM 04/09/2022    2:17 PM  PHQ-SADS Last 3 Score only  Total GAD-7 Score 0    PHQ Adolescent Score  0 0       Assessment: Patient currently experiencing grief. Spouse  passed away in 2021/11/20.  Patient offered grief counseling at that time, but declined. Patient agreed to contact agency and proceed with grief counseling.  Patient will work with [facility] to decrease feelings of sadness. Patient declined having anxiety or depression. [facility] will monitor patient progress and update PHQSAD if and once needed.   Patient may benefit from Grief counseling and ongoing therapy with [facility].  Plan: Follow up with behavioral health clinician on : 06/18/2022   I discussed the assessment and treatment plan with the patient and/or parent/guardian. They were provided an opportunity to ask questions and all were answered. They agreed with the plan and demonstrated an understanding of the instructions.   They were advised to call back or seek an in-person evaluation if the symptoms worsen or if the condition fails to improve as anticipated.  Christen Butter, MSW, LCSW-A She/Her Behavioral Health Clinician [facility]  Internal Medicine Center Direct Dial:[phone]  Fax [phone] Main Office Phone: [phone] [address] [place] [place], [place] [address] Website: [facility] Internal Medicine [facility]  [place], [place]  [place]

## 2022-06-18 ENCOUNTER — Ambulatory Visit: Payer: Self-pay | Admitting: Licensed Clinical Social Worker

## 2022-06-18 DIAGNOSIS — F419 Anxiety disorder, unspecified: Secondary | ICD-10-CM

## 2022-06-18 NOTE — BH Specialist Note (Signed)
Patient no-showed today's appointment; appointment was for Telephone visit at 10:300am. Phone went to busy tone. [facility] attempted patient again at 3:15 Pm. [facility] left a VM.  [facility] will attempt patient again in the next 7-days.   Patient will need to reschedule appointment by calling Internal medicine center [phone].  Christen Butter, MSW, LCSW-A She[facility]Her Behavioral Health Clinician [facility]  Internal Medicine Center Direct [phone]  Fax [phone] Main Office Phone: [phone] [address] [place] [place], [place] [address] Website: [facility] Internal Medicine [facility]  [place], [place]  [place]

## 2022-07-13 ENCOUNTER — Other Ambulatory Visit (HOSPITAL_COMMUNITY): Payer: Self-pay

## 2022-07-23 ENCOUNTER — Encounter: Payer: Self-pay | Admitting: Student

## 2022-07-30 ENCOUNTER — Other Ambulatory Visit: Payer: Self-pay | Admitting: Student

## 2022-07-30 DIAGNOSIS — Z1231 Encounter for screening mammogram for malignant neoplasm of breast: Secondary | ICD-10-CM

## 2022-08-02 ENCOUNTER — Other Ambulatory Visit: Payer: Self-pay

## 2022-08-02 DIAGNOSIS — I10 Essential (primary) hypertension: Secondary | ICD-10-CM

## 2022-08-02 MED ORDER — AMLODIPINE BESYLATE 10 MG PO TABS: ORAL_TABLET | ORAL | 3 refills | Status: DC

## 2022-08-17 ENCOUNTER — Other Ambulatory Visit: Payer: Self-pay | Admitting: Student

## 2022-08-17 DIAGNOSIS — E119 Type 2 diabetes mellitus without complications: Secondary | ICD-10-CM

## 2022-08-20 ENCOUNTER — Other Ambulatory Visit (HOSPITAL_COMMUNITY): Payer: Self-pay

## 2022-08-20 MED ORDER — MOUNJARO 10 MG/0.5ML ~~LOC~~ SOAJ
10.0000 mg | SUBCUTANEOUS | 2 refills | Status: DC
Start: 2022-08-20 — End: 2022-11-19
  Filled 2022-08-20: qty 2, 28d supply, fill #0
  Filled 2022-09-13: qty 2, 28d supply, fill #1
  Filled 2022-10-11: qty 2, 28d supply, fill #2

## 2022-08-21 ENCOUNTER — Other Ambulatory Visit (HOSPITAL_COMMUNITY): Payer: Self-pay

## 2022-09-05 ENCOUNTER — Ambulatory Visit (INDEPENDENT_AMBULATORY_CARE_PROVIDER_SITE_OTHER): Payer: Medicare (Managed Care)

## 2022-09-05 VITALS — BP 139/61 | HR 82 | Ht 61.0 in | Wt 226.3 lb

## 2022-09-05 DIAGNOSIS — Z0000 Encounter for general adult medical examination without abnormal findings: Secondary | ICD-10-CM

## 2022-09-05 NOTE — Progress Notes (Signed)
Subjective:   Kristen Frost is a 67 y.o. female who presents for Medicare Annual (Subsequent) preventive examination.  Visit Complete: In person  Review of Systems    Cardiac Risk Factors include: advanced age (>20men, >40 women);diabetes mellitus;dyslipidemia;obesity (BMI >30kg/m2);hypertension;Other (see comment), Risk factor comments: CKD, OSA     Objective:    Today's Vitals   09/05/22 1312  BP: 139/61  Pulse: 82  SpO2: 99%  Weight: 226 lb 4.8 oz (102.6 kg)  Height: 5\' 1"  (1.549 m)   Body mass index is 42.76 kg/m.     09/05/2022    1:29 PM 06/08/2022    9:20 AM 05/08/2022    3:17 PM 04/09/2022    2:16 PM 02/20/2022    8:50 AM 11/10/2021   10:33 AM 08/14/2021   10:36 AM  Advanced Directives  Does Patient Have a Medical Advance Directive? No No No No No No No  Would patient like information on creating a medical advance directive? Yes (MAU/Ambulatory/Procedural Areas - Information given) No - Patient declined No - Patient declined No - Patient declined Yes (MAU/Ambulatory/Procedural Areas - Information given) No - Patient declined No - Patient declined    Current Medications (verified) Outpatient Encounter Medications as of 09/05/2022  Medication Sig   amLODipine (NORVASC) 10 MG tablet TAKE 1 TABLET BY MOUTH DAILY   Cholecalciferol (VITAMIN D) 2000 units CAPS Take 1 capsule (2,000 Units total) by mouth daily. (Patient taking differently: Take 2,000 Units by mouth daily.)   diclofenac Sodium (VOLTAREN) 1 % GEL Apply 4 g topically 4 (four) times daily.   fluticasone (FLONASE) 50 MCG/ACT nasal spray SHAKE LIQUID AND USE 1 SPRAY IN EACH NOSTRIL DAILY AS NEEDED   Insulin Pen Needle (PEN NEEDLES) 31G X 5 MM MISC 1 application by Does not apply route daily.   tirzepatide (MOUNJARO) 10 MG/0.5ML Pen Inject 10 mg into the skin once a week.   atorvastatin (LIPITOR) 40 MG tablet Take 1 tablet (40 mg total) by mouth daily.   cetirizine (ZYRTEC ALLERGY) 10 MG tablet Take 1 tablet  (10 mg total) by mouth daily. (Patient not taking: Reported on 09/05/2022)   ferrous sulfate 325 (65 FE) MG tablet Take 1 tablet (325 mg total) by mouth daily with breakfast. (Patient taking differently: Take 325 mg by mouth daily with breakfast. )   No facility-administered encounter medications on file as of 09/05/2022.    Allergies (verified) Patient has no known allergies.   History: Past Medical History:  Diagnosis Date   Anxiety    Arthritis    Bleeding hemorrhoids    grade 2   Diverticulosis of colon    Full dentures    History of colon polyps    Hyperlipidemia    Hypertension    followed by pcp   (04-30-2019  per pt had stress test approx. 2005, was told normal (not available in epic)   IDA (iron deficiency anemia)    OSA on CPAP    followed by dr Craige Cotta ([place] [facility])    (04-30-2019  pt  stated uses every night)   Restless leg syndrome    Type 2 diabetes mellitus (HCC)    followed by pcp   (04-30-2019  pt stated does not check blood sugar at home)   Past Surgical History:  Procedure Laterality Date   CHOLECYSTECTOMY N/A 06/07/2016   Procedure: LAPAROSCOPIC CHOLECYSTECTOMY;  Surgeon: Violeta Gelinas, MD;  [facility] OR;  Service: General;  Laterality: N/A;   COLONOSCOPY WITH PROPOFOL  last  one 04-10-2018    dr Myrtie Neither   EYE SURGERY Bilateral 05/2022   Cataract surgery   ROTATOR CUFF REPAIR Left 2007   approx.   TRANSANAL HEMORRHOIDAL DEARTERIALIZATION N/A 05/07/2019   Procedure: TRANSANAL HEMORRHOIDAL DEARTERIALIZATION;  Surgeon: Romie Levee, MD;  Location: [facility];  Service: General;  Laterality: N/A;   Family History  Problem Relation Age of Onset   Allergies Father    Alcohol abuse Father    Tuberculosis Father        deceased from TB   Allergies Other        sibling   Asthma Other        sibling   Hypertension Other        sibling   Migraines Other        sibling   Rashes / Skin problems Other        sibling   Gout Other         sibling   Social History   Socioeconomic History   Marital status: Widowed    Spouse name: Not on file   Number of children: 5   Years of education: Not on file   Highest education level: Not on file  Occupational History   Occupation: unemployeed  Tobacco Use   Smoking status: Never   Smokeless tobacco: Never  Vaping Use   Vaping status: Never Used  Substance and Sexual Activity   Alcohol use: No   Drug use: Never   Sexual activity: Not on file  Other Topics Concern   Not on file  Social History Narrative   Has 5 children and 1 step child.  Husband passed recently.   Social Determinants of Health   Financial Resource Strain: Low Risk  (09/05/2022)   Overall Financial Resource Strain (CARDIA)    Difficulty of Paying Living Expenses: Not very hard  Food Insecurity: No Food Insecurity (09/05/2022)   Hunger Vital Sign    Worried About Running Out of Food in the Last Year: Never true    Ran Out of Food in the Last Year: Never true  Transportation Needs: Unmet Transportation Needs (09/05/2022)   PRAPARE - Transportation    Lack of Transportation (Medical): Yes    Lack of Transportation (Non-Medical): Yes  Physical Activity: Sufficiently Active (09/05/2022)   Exercise Vital Sign    Days of Exercise per Week: 4 days    Minutes of Exercise per Session: 40 min  Stress: No Stress Concern Present (09/05/2022)   [employer] of Occupational Health - Occupational Stress Questionnaire    Feeling of Stress : Not at all  Social Connections: Moderately Integrated (09/05/2022)   Social Connection and Isolation Panel [NHANES]    Frequency of Communication with Friends and Family: More than three times a week    Frequency of Social Gatherings with Friends and Family: More than three times a week    Attends Religious Services: More than 4 times per year    Active Member of [employer] or Organizations: Yes    Attends Banker Meetings: 1 to 4 times per year    Marital Status:  Widowed    Tobacco Counseling Counseling given: Not Answered   Clinical Intake:  Pre-visit preparation completed: Yes  Pain : No/denies pain     BMI - recorded: 42.76 Nutritional Status: BMI > 30  Obese Nutritional Risks: None Diabetes: Yes CBG done?: No Did pt. bring in CBG monitor from home?: No  How often do you need to  have someone help you when you read instructions, pamphlets, or other written materials from your doctor or pharmacy?: 1 - Never     Information entered by :: Beckham Capistran, RMA   Activities of Daily Living    09/05/2022    1:20 PM 05/08/2022    3:15 PM  In your present state of health, do you have any difficulty performing the following activities:  Hearing? 1 0  Comment Has recently been to ear doctor   Vision? 0 0  Difficulty concentrating or making decisions? 0 0  Walking or climbing stairs? 0 0  Dressing or bathing? 0 0  Doing errands, shopping? 0 0  Preparing Food and eating ? N   Using the Toilet? N   In the past six months, have you accidently leaked urine? N   Do you have problems with loss of bowel control? N   Managing your Medications? N     Patient Care Team: Morene Crocker, MD as PCP - General (Student)  Indicate any recent Medical Services you may have received from other than [facility] providers in the past year (date may be approximate).     Assessment:   This is a routine wellness examination for [place].  Hearing/Vision screen Hearing Screening - Comments:: Some difficulty on the rt side.   Vision Screening - Comments:: Wears eyeglasses.  Dietary issues and exercise activities discussed:     Goals Addressed               This Visit's Progress     Weight (lb) < 200 lb (90.7 kg) (pt-stated)   226 lb 4.8 oz (102.6 kg)     Make sure she stays in tuned to her physical health by keeping up with doc appointments.  She has lost weight since medication Mounjaro.      Depression Screen    09/05/2022    1:40 PM  06/08/2022    9:19 AM 04/09/2022    2:17 PM 02/20/2022    9:20 AM 11/10/2021   10:30 AM 09/14/2021   10:09 AM 08/14/2021    9:30 AM  PHQ 2/9 Scores  PHQ - 2 Score 0 0 0 0 0 1 1  PHQ- 9 Score 0 0 0 0 0 1 1    Fall Risk    09/05/2022    1:33 PM 05/08/2022    3:16 PM 04/09/2022    1:58 PM 02/20/2022    9:20 AM 11/10/2021   10:33 AM  Fall Risk   Falls in the past year? 0 0 0 0 0  Number falls in past yr: 0 0 0 0 0  Injury with Fall? 0 0 0  0  Risk for fall due to : No Fall Risks  History of fall(s) No Fall Risks   Follow up Falls prevention discussed Falls evaluation completed  Falls evaluation completed Falls evaluation completed    MEDICARE RISK AT HOME:  Medicare Risk at Home - 09/05/22 1333     Any stairs in or around the home? Yes    If so, are there any without handrails? Yes    Home free of loose throw rugs in walkways, pet beds, electrical cords, etc? Yes    Adequate lighting in your home to reduce risk of falls? Yes    Life alert? No    Use of a cane, walker or w/c? No    Grab bars in the bathroom? No    Shower chair or bench in shower? Yes  Per patient-she is in need of a new shower chair as the one she has was given to her.   Elevated toilet seat or a handicapped toilet? No             TIMED UP AND GO:  Was the test performed?  Yes  Length of time to ambulate 10 feet: 15 sec Gait slow and steady without use of assistive device    Cognitive Function:        09/05/2022    1:35 PM 08/14/2021   10:38 AM  6CIT Screen  What Year?  0 points  What month?  0 points  What time? 0 points 0 points  Count back from 20 0 points   Months in reverse 0 points 0 points  Repeat phrase 2 points 0 points    Immunizations Immunization History  Administered Date(s) Administered   PNEUMOCOCCAL CONJUGATE-20 02/20/2022   Pneumococcal Conjugate-13 01/18/2021    TDAP status: Due, Education has been provided regarding the importance of this vaccine. Advised may receive this  vaccine at local pharmacy or Health Dept. Aware to provide a copy of the vaccination record if obtained from local pharmacy or Health Dept. Verbalized acceptance and understanding.  Flu Vaccine status: Declined, Education has been provided regarding the importance of this vaccine but patient still declined. Advised may receive this vaccine at local pharmacy or Health Dept. Aware to provide a copy of the vaccination record if obtained from local pharmacy or Health Dept. Verbalized acceptance and understanding.  Pneumococcal vaccine status: Up to date  Covid-19 vaccine status: Declined, Education has been provided regarding the importance of this vaccine but patient still declined. Advised may receive this vaccine at local pharmacy or Health Dept.or vaccine clinic. Aware to provide a copy of the vaccination record if obtained from local pharmacy or Health Dept. Verbalized acceptance and understanding.  Qualifies for Shingles Vaccine? Yes   Zostavax completed No   Shingrix Completed?: Yes   Screening Tests Health Maintenance  Topic Date Due   DTaP/Tdap/Td (1 - Tdap) Never done   Zoster Vaccines- Shingrix (1 of 2) Never done   COVID-19 Vaccine (1 - 2023-24 season) Never done   OPHTHALMOLOGY EXAM  01/31/2022   Diabetic kidney evaluation - Urine ACR  06/29/2022   FOOT EXAM  06/29/2022   HEMOGLOBIN A1C  08/21/2022   INFLUENZA VACCINE  09/20/2022   Diabetic kidney evaluation - eGFR measurement  04/10/2023   Medicare Annual Wellness (AWV)  09/05/2023   MAMMOGRAM  09/13/2023   Colonoscopy  04/10/2025   Pneumonia Vaccine 49+ Years old  Completed   DEXA SCAN  Completed   Hepatitis C Screening  Completed   HPV VACCINES  Aged Out    Health Maintenance  Health Maintenance Due  Topic Date Due   DTaP/Tdap/Td (1 - Tdap) Never done   Zoster Vaccines- Shingrix (1 of 2) Never done   COVID-19 Vaccine (1 - 2023-24 season) Never done   OPHTHALMOLOGY EXAM  01/31/2022   Diabetic kidney evaluation -  Urine ACR  06/29/2022   FOOT EXAM  06/29/2022   HEMOGLOBIN A1C  08/21/2022    Colorectal cancer screening: Type of screening: Colonoscopy. Completed 04/10/2018. Repeat every 7 years  Mammogram status: Completed 09/12/2021. Repeat every year 2.  Bone Density status: Completed 05/30/2021. Results reflect: Bone density results: NORMAL. Repeat every 2 years.  Lung Cancer Screening: (Low Dose CT Chest recommended if Age 67-80 years, 20 pack-year currently smoking OR have quit w/in 15years.) does not qualify.  Lung Cancer Screening Referral: N/A  Additional Screening:  Hepatitis C Screening: does qualify; Completed 01/18/2021  Vision Screening: Recommended annual ophthalmology exams for early detection of glaucoma and other disorders of the eye. Is the patient up to date with their annual eye exam?  Yes  Who is the provider or what is the name of the office in which the patient attends annual eye exams? [facility] If pt is not established with a provider, would they like to be referred to a provider to establish care? No .   Dental Screening: Recommended annual dental exams for proper oral hygiene  Diabetic Foot Exam: Diabetic Foot Exam: Completed 06/28/2021  Community Resource Referral / Chronic Care Management: CRR required this visit?  No   [name] required this visit?  No     Plan:     I have personally reviewed and noted the following in the patient's chart:   Medical and social history Use of alcohol, tobacco or illicit drugs  Current medications and supplements including opioid prescriptions. Patient is not currently taking opioid prescriptions. Functional ability and status Nutritional status Physical activity Advanced directives List of other physicians Hospitalizations, surgeries, and ER visits in previous 12 months Vitals Screenings to include cognitive, depression, and falls Referrals and appointments  In addition, I have reviewed and discussed with patient certain  preventive protocols, quality metrics, and best practice recommendations. A written personalized care plan for preventive services as well as general preventive health recommendations were provided to patient.     Radley Barto L Bonnie Overdorf, CMA   09/05/2022   After Visit Summary:   Nurse Notes: Patient is due for her Tdap and will schedule a appointment here for a follow up visit.  She will request the Tdap at that time.Patient is in need of a new CPAP machine or supplies needed for machine.  She received her Shingrix at [facility] in April and 2nd dose in July 2024.  Need records.  Also she is requesting to have a new shower chair.

## 2022-09-05 NOTE — Patient Instructions (Signed)
Kristen Frost , Thank you for taking time to come for your Medicare Wellness Visit. I appreciate your ongoing commitment to your health goals. Please review the following plan we discussed and let me know if I can assist you in the future.   These are the goals we discussed:  Goals       Weight (lb) < 200 lb (90.7 kg) (pt-stated)      Make sure she stays in tuned to her physical health by keeping up with doc appointments.  She has lost weight since medication Mounjaro.        This is a list of the screening recommended for you and due dates:  Health Maintenance  Topic Date Due   DTaP/Tdap/Td vaccine (1 - Tdap) Never done   Zoster (Shingles) Vaccine (1 of 2) Never done   COVID-19 Vaccine (1 - 2023-24 season) Never done   Eye exam for diabetics  01/31/2022   Yearly kidney health urinalysis for diabetes  06/29/2022   Complete foot exam   06/29/2022   Hemoglobin A1C  08/21/2022   Flu Shot  09/20/2022   Yearly kidney function blood test for diabetes  04/10/2023   Medicare Annual Wellness Visit  09/05/2023   Mammogram  09/13/2023   Colon Cancer Screening  04/10/2025   Pneumonia Vaccine  Completed   DEXA scan (bone density measurement)  Completed   Hepatitis C Screening  Completed   HPV Vaccine  Aged Out    Advanced directives: Provided a information packet.  Conditions/risks identified: Keep up the good work.  Thank you for stopping in for this appointment.  Remember to inquire about the Tdap at your pharmacy or when you have your follow up visit with your PCP.    Next appointment: Follow up in one year for your annual wellness visit    Preventive Care 65 Years and Older, Female Preventive care refers to lifestyle choices and visits with your health care provider that can promote health and wellness. What does preventive care include? A yearly physical exam. This is also called an annual well check. Dental exams once or twice a year. Routine eye exams. Ask your health care  provider how often you should have your eyes checked. Personal lifestyle choices, including: Daily care of your teeth and gums. Regular physical activity. Eating a healthy diet. Avoiding tobacco and drug use. Limiting alcohol use. Practicing safe sex. Taking low-dose aspirin every day. Taking vitamin and mineral supplements as recommended by your health care provider. What happens during an annual well check? The services and screenings done by your health care provider during your annual well check will depend on your age, overall health, lifestyle risk factors, and family history of disease. Counseling  Your health care provider may ask you questions about your: Alcohol use. Tobacco use. Drug use. Emotional well-being. Home and relationship well-being. Sexual activity. Eating habits. History of falls. Memory and ability to understand (cognition). Work and work Astronomer. Reproductive health. Screening  You may have the following tests or measurements: Height, weight, and BMI. Blood pressure. Lipid and cholesterol levels. These may be checked every 5 years, or more frequently if you are over 70 years old. Skin check. Lung cancer screening. You may have this screening every year starting at age 50 if you have a 30-pack-year history of smoking and currently smoke or have quit within the past 15 years. Fecal occult blood test (FOBT) of the stool. You may have this test every year starting at age 55. Flexible  sigmoidoscopy or colonoscopy. You may have a sigmoidoscopy every 5 years or a colonoscopy every 10 years starting at age 9. Hepatitis C blood test. Hepatitis B blood test. Sexually transmitted disease (STD) testing. Diabetes screening. This is done by checking your blood sugar (glucose) after you have not eaten for a while (fasting). You may have this done every 1-3 years. Bone density scan. This is done to screen for osteoporosis. You may have this done starting at age  76. Mammogram. This may be done every 1-2 years. Talk to your health care provider about how often you should have regular mammograms. Talk with your health care provider about your test results, treatment options, and if necessary, the need for more tests. Vaccines  Your health care provider may recommend certain vaccines, such as: Influenza vaccine. This is recommended every year. Tetanus, diphtheria, and acellular pertussis (Tdap, Td) vaccine. You may need a Td booster every 10 years. Zoster vaccine. You may need this after age 59. Pneumococcal 13-valent conjugate (PCV13) vaccine. One dose is recommended after age 34. Pneumococcal polysaccharide (PPSV23) vaccine. One dose is recommended after age 38. Talk to your health care provider about which screenings and vaccines you need and how often you need them. This information is not intended to replace advice given to you by your health care provider. Make sure you discuss any questions you have with your health care provider. Document Released: 03/04/2015 Document Revised: 10/26/2015 Document Reviewed: 12/07/2014 Elsevier Interactive Patient Education  2017 [employer].  Fall Prevention in the Home Falls can cause injuries. They can happen to people of all ages. There are many things you can do to make your home safe and to help prevent falls. What can I do on the outside of my home? Regularly fix the edges of walkways and driveways and fix any cracks. Remove anything that might make you trip as you walk through a door, such as a raised step or threshold. Trim any bushes or trees on the path to your home. Use bright outdoor lighting. Clear any walking paths of anything that might make someone trip, such as rocks or tools. Regularly check to see if handrails are loose or broken. Make sure that both sides of any steps have handrails. Any raised decks and porches should have guardrails on the edges. Have any leaves, snow, or ice cleared  regularly. Use sand or salt on walking paths during winter. Clean up any spills in your garage right away. This includes oil or grease spills. What can I do in the bathroom? Use night lights. Install grab bars by the toilet and in the tub and shower. Do not use towel bars as grab bars. Use non-skid mats or decals in the tub or shower. If you need to sit down in the shower, use a plastic, non-slip stool. Keep the floor dry. Clean up any water that spills on the floor as soon as it happens. Remove soap buildup in the tub or shower regularly. Attach bath mats securely with double-sided non-slip rug tape. Do not have throw rugs and other things on the floor that can make you trip. What can I do in the bedroom? Use night lights. Make sure that you have a light by your bed that is easy to reach. Do not use any sheets or blankets that are too big for your bed. They should not hang down onto the floor. Have a firm chair that has side arms. You can use this for support while you get dressed.  Do not have throw rugs and other things on the floor that can make you trip. What can I do in the kitchen? Clean up any spills right away. Avoid walking on wet floors. Keep items that you use a lot in easy-to-reach places. If you need to reach something above you, use a strong step stool that has a grab bar. Keep electrical cords out of the way. Do not use floor polish or wax that makes floors slippery. If you must use wax, use non-skid floor wax. Do not have throw rugs and other things on the floor that can make you trip. What can I do with my stairs? Do not leave any items on the stairs. Make sure that there are handrails on both sides of the stairs and use them. Fix handrails that are broken or loose. Make sure that handrails are as long as the stairways. Check any carpeting to make sure that it is firmly attached to the stairs. Fix any carpet that is loose or worn. Avoid having throw rugs at the top or  bottom of the stairs. If you do have throw rugs, attach them to the floor with carpet tape. Make sure that you have a light switch at the top of the stairs and the bottom of the stairs. If you do not have them, ask someone to add them for you. What else can I do to help prevent falls? Wear shoes that: Do not have high heels. Have rubber bottoms. Are comfortable and fit you well. Are closed at the toe. Do not wear sandals. If you use a stepladder: Make sure that it is fully opened. Do not climb a closed stepladder. Make sure that both sides of the stepladder are locked into place. Ask someone to hold it for you, if possible. Clearly mark and make sure that you can see: Any grab bars or handrails. First and last steps. Where the edge of each step is. Use tools that help you move around (mobility aids) if they are needed. These include: Canes. Walkers. Scooters. Crutches. Turn on the lights when you go into a dark area. Replace any light bulbs as soon as they burn out. Set up your furniture so you have a clear path. Avoid moving your furniture around. If any of your floors are uneven, fix them. If there are any pets around you, be aware of where they are. Review your medicines with your doctor. Some medicines can make you feel dizzy. This can increase your chance of falling. Ask your doctor what other things that you can do to help prevent falls. This information is not intended to replace advice given to you by your health care provider. Make sure you discuss any questions you have with your health care provider. Document Released: 12/02/2008 Document Revised: 07/14/2015 Document Reviewed: 03/12/2014 Elsevier Interactive Patient Education  2017 [employer].

## 2022-09-14 ENCOUNTER — Ambulatory Visit
Admission: RE | Admit: 2022-09-14 | Discharge: 2022-09-14 | Disposition: A | Discharge status: Home / Self Care | Payer: Medicare (Managed Care) | Source: Ambulatory Visit | Attending: Internal Medicine | Admitting: Internal Medicine

## 2022-09-14 DIAGNOSIS — Z1231 Encounter for screening mammogram for malignant neoplasm of breast: Secondary | ICD-10-CM

## 2022-09-19 ENCOUNTER — Encounter: Payer: Self-pay | Admitting: Student

## 2022-09-19 ENCOUNTER — Ambulatory Visit: Payer: Medicare (Managed Care) | Admitting: Student

## 2022-09-19 VITALS — BP 132/79 | HR 77 | Temp 98.0°F | Ht 61.0 in | Wt 226.0 lb

## 2022-09-19 DIAGNOSIS — E785 Hyperlipidemia, unspecified: Secondary | ICD-10-CM | POA: Diagnosis not present

## 2022-09-19 DIAGNOSIS — E1122 Type 2 diabetes mellitus with diabetic chronic kidney disease: Secondary | ICD-10-CM | POA: Diagnosis not present

## 2022-09-19 DIAGNOSIS — I10 Essential (primary) hypertension: Secondary | ICD-10-CM

## 2022-09-19 DIAGNOSIS — N182 Chronic kidney disease, stage 2 (mild): Secondary | ICD-10-CM

## 2022-09-19 DIAGNOSIS — I129 Hypertensive chronic kidney disease with stage 1 through stage 4 chronic kidney disease, or unspecified chronic kidney disease: Secondary | ICD-10-CM

## 2022-09-19 DIAGNOSIS — E119 Type 2 diabetes mellitus without complications: Secondary | ICD-10-CM | POA: Diagnosis not present

## 2022-09-19 DIAGNOSIS — G4733 Obstructive sleep apnea (adult) (pediatric): Secondary | ICD-10-CM | POA: Diagnosis not present

## 2022-09-19 DIAGNOSIS — Z634 Disappearance and death of family member: Secondary | ICD-10-CM

## 2022-09-19 DIAGNOSIS — N644 Mastodynia: Secondary | ICD-10-CM

## 2022-09-19 LAB — POCT GLYCOSYLATED HEMOGLOBIN (HGB A1C): Hemoglobin A1C: 5.9 % — AB (ref 4.0–5.6)

## 2022-09-19 LAB — GLUCOSE, CAPILLARY: Glucose-Capillary: 85 mg/dL (ref 70–99)

## 2022-09-19 MED ORDER — ATORVASTATIN CALCIUM 40 MG PO TABS
40.0000 mg | ORAL_TABLET | Freq: Every day | ORAL | 3 refills | Status: DC
Start: 2022-09-19 — End: 2023-10-18

## 2022-09-19 NOTE — Assessment & Plan Note (Addendum)
Continues to wear cPAP nightly. Has difficulty getting supplioes for the machine under new insurance -Will touch base with our DME coordinator to inquire about help with DME needs

## 2022-09-19 NOTE — Assessment & Plan Note (Signed)
Patient has continued to find support from her community. Her mood and outlook in life continue to be uplifted with her everyday activities. She feels emotionally "strong" despite missing her husband, whose one year death anniversary is this upcoming September.

## 2022-09-19 NOTE — Assessment & Plan Note (Signed)
Patient has been taking her late husbands Atorvastatin 40 mg at home. Will refill atorvastatin prescription and monitor lipid panel today - Lipid panel today

## 2022-09-19 NOTE — Assessment & Plan Note (Addendum)
Last A1c:  Lab Results  Component Value Date   HGBA1C 5.9 (A) 09/19/2022   Currently at goal on Mounjaro 10 mg daily. Continues to exercise 4x weekly and improving dietary modifications, which patient reports have been successful -Patient was recently seen by ophthalmology for cataract surgery -Foot exam done today without abnormalities - Urine microalbumin today -Currently on Atorvastatin 40 mg, will refill -A1c every 6-12 months

## 2022-09-19 NOTE — Assessment & Plan Note (Signed)
Continued with controlled HTN and DM.  -BMP today -Urine microalbumin/Cr ratio today

## 2022-09-19 NOTE — Assessment & Plan Note (Addendum)
Patient returns to clinic for hypertension follow up. Initially, uncontrolled with systolic BP in 150s. Patient denies chest pain, shortness of breath, HA or changes in vision today. Adherent to amlodipine 10 mg daily, which she last refilled in June for 90 days. Repeat BP today 132/79. Will keep medications as scheduled, however, this patient may benefit from ARB/ACEi in the future. - BMP today

## 2022-09-19 NOTE — Progress Notes (Signed)
Subjective:  CC: Chronic condition follow up  HPI:  [name] is a 67 y.o. female with a past medical history stated below and presents today for HTN, DM, Obesity, hyperlipidemia, result follow up. Please see problem based assessment and plan for additional details.  Past Medical History:  Diagnosis Date   Anxiety    Arthritis    Bleeding hemorrhoids    grade 2   Diverticulosis of colon    Full dentures    History of colon polyps    Hyperlipidemia    Hypertension    followed by pcp   (04-30-2019  per pt had stress test approx. 2005, was told normal (not available in epic)   IDA (iron deficiency anemia)    OSA on CPAP    followed by dr Craige Cotta ([facility])    (04-30-2019  pt  stated uses every night)   Restless leg syndrome    Type 2 diabetes mellitus (HCC)    followed by pcp   (04-30-2019  pt stated does not check blood sugar at home)    Current Outpatient Medications on File Prior to Visit  Medication Sig Dispense Refill   amLODipine (NORVASC) 10 MG tablet TAKE 1 TABLET BY MOUTH DAILY 90 tablet 3   cetirizine (ZYRTEC ALLERGY) 10 MG tablet Take 1 tablet (10 mg total) by mouth daily. (Patient not taking: Reported on 09/05/2022) 90 tablet 3   Cholecalciferol (VITAMIN D) 2000 units CAPS Take 1 capsule (2,000 Units total) by mouth daily. (Patient taking differently: Take 2,000 Units by mouth daily.) 90 capsule 1   diclofenac Sodium (VOLTAREN) 1 % GEL Apply 4 g topically 4 (four) times daily. 50 g 1   ferrous sulfate 325 (65 FE) MG tablet Take 1 tablet (325 mg total) by mouth daily with breakfast. (Patient taking differently: Take 325 mg by mouth daily with breakfast. ) 90 tablet 1   fluticasone (FLONASE) 50 MCG/ACT nasal spray SHAKE LIQUID AND USE 1 SPRAY IN EACH NOSTRIL DAILY AS NEEDED 16 g 3   Insulin Pen Needle (PEN NEEDLES) 31G X 5 MM MISC 1 application by Does not apply route daily. 90 each 1   tirzepatide (MOUNJARO) 10 MG/0.5ML Pen Inject 10 mg into the skin  once a week. 2 mL 2   No current facility-administered medications on file prior to visit.    Family History  Problem Relation Age of Onset   Allergies Father    Alcohol abuse Father    Tuberculosis Father        deceased from TB   Allergies Other        sibling   Asthma Other        sibling   Hypertension Other        sibling   Migraines Other        sibling   Rashes / Skin problems Other        sibling   Gout Other        sibling    Social History   Socioeconomic History   Marital status: Widowed    Spouse name: Not on file   Number of children: 5   Years of education: Not on file   Highest education level: Not on file  Occupational History   Occupation: unemployeed  Tobacco Use   Smoking status: Never   Smokeless tobacco: Never  Vaping Use   Vaping status: Never Used  Substance and Sexual Activity   Alcohol use: No   Drug  use: Never   Sexual activity: Not on file  Other Topics Concern   Not on file  Social History Narrative   Has 5 children and 1 step child.  Husband passed recently.   Social Determinants of Health   Financial Resource Strain: Low Risk  (09/05/2022)   Overall Financial Resource Strain (CARDIA)    Difficulty of Paying Living Expenses: Not very hard  Food Insecurity: No Food Insecurity (09/05/2022)   Hunger Vital Sign    Worried About Running Out of Food in the Last Year: Never true    Ran Out of Food in the Last Year: Never true  Transportation Needs: Unmet Transportation Needs (09/05/2022)   PRAPARE - Transportation    Lack of Transportation (Medical): Yes    Lack of Transportation (Non-Medical): Yes  Physical Activity: Sufficiently Active (09/05/2022)   Exercise Vital Sign    Days of Exercise per Week: 4 days    Minutes of Exercise per Session: 40 min  Stress: No Stress Concern Present (09/05/2022)   [employer] of Occupational Health - Occupational Stress Questionnaire    Feeling of Stress : Not at all  Social Connections:  Moderately Integrated (09/05/2022)   Social Connection and Isolation Panel [NHANES]    Frequency of Communication with Friends and Family: More than three times a week    Frequency of Social Gatherings with Friends and Family: More than three times a week    Attends Religious Services: More than 4 times per year    Active Member of [employer] or Organizations: Yes    Attends Banker Meetings: 1 to 4 times per year    Marital Status: Widowed  Intimate Partner Violence: Not At Risk (09/05/2022)   Humiliation, Afraid, Rape, and Kick questionnaire    Fear of Current or Ex-Partner: No    Emotionally Abused: No    Physically Abused: No    Sexually Abused: No    Review of Systems: ROS negative except for what is noted on the assessment and plan.  Objective:   Vitals:   09/19/22 0954 09/19/22 1027  BP: (!) 156/68 132/79  Pulse: 79 77  Temp: 98 F (36.7 C)   TempSrc: Oral   SpO2: 100%   Weight: 226 lb (102.5 kg)   Height: 5\' 1"  (1.549 m)     Physical Exam: Constitutional: well-appearing woman sitting in exam chair, in no acute distress HENT: normocephalic atraumatic, mucous membranes moist Neck: supple Cardiovascular: regular rate and rhythm, no m/r/g Pulmonary/Chest: normal work of breathing on room air, lungs clear to auscultation bilaterally Abdominal: soft, non-tender, non-distended MSK: normal bulk and tone Neurological: alert & oriented x 3, 5/5 strength in bilateral upper and lower extremities, normal gait Skin: warm and dry Psych: Pleasant mood and affect       09/19/2022    9:53 AM  Depression screen PHQ 2/9  Decreased Interest 0  Down, Depressed, Hopeless 0  PHQ - 2 Score 0       06/08/2022    9:20 AM 02/27/2021   11:52 AM 12/19/2020   10:16 AM 11/09/2019    3:05 PM  GAD 7 : Generalized Anxiety Score  Nervous, Anxious, on Edge 0 0 1 0  Control/stop worrying 0 0 1 0  Worry too much - different things 0 0 1 0  Trouble relaxing 0 0 0 0  Restless 0 0 0 0   Easily annoyed or irritable 0 0 1 0  Afraid - awful might happen 0 0 1 0  Total GAD 7 Score 0 0 5 0  Anxiety Difficulty  Not difficult at all --      Assessment & Plan:   HTN (hypertension) Patient returns to clinic for hypertension follow up. Initially, uncontrolled with systolic BP in 150s. Patient denies chest pain, shortness of breath, HA or changes in vision today. Adherent to amlodipine 10 mg daily, which she last refilled in June for 90 days. Repeat BP today 132/79. Will keep medications as scheduled, however, this patient may benefit from ARB/ACEi in the future. - BMP today  T2DM (type 2 diabetes mellitus) (HCC) Last A1c:  Lab Results  Component Value Date   HGBA1C 5.9 (A) 09/19/2022   Currently at goal on Mounjaro 10 mg daily. Continues to exercise 4x weekly and improving dietary modifications, which patient reports have been successful -Patient was recently seen by ophthalmology for cataract surgery -Foot exam done today without abnormalities - Urine microalbumin today -Currently on Atorvastatin 40 mg, will refill -A1c every 6-12 months  OSA (obstructive sleep apnea) Continues to wear cPAP nightly. Has difficulty getting supplioes for the machine under new insurance -Will touch base with our DME coordinator to inquire about help with DME needs   Bereavement Patient has continued to find support from her community. Her mood and outlook in life continue to be uplifted with her everyday activities. She feels emotionally "strong" despite missing her husband, whose one year death anniversary is this upcoming September.  CKD (chronic kidney disease) stage 2, GFR 60-89 ml/min Continued with controlled HTN and DM.  -BMP today -Urine microalbumin/Cr ratio today  Dyslipidemia Patient has been taking her late husbands Atorvastatin 40 mg at home. Will refill atorvastatin prescription and monitor lipid panel today - Lipid panel today  Breast pain, right Reviewed results of  diagnostic mammogram without abnormalities. Pain on the R has nearly resolved with the prescribed localized pain medications.   Return in about 3 months (around 12/20/2022) for HTN and cholesterol follow up.  Patient discussed with Dr. Gardiner Ramus, MD [facility] Internal Medicine Program - PGY-2 09/19/2022, 11:54 AM

## 2022-09-19 NOTE — Assessment & Plan Note (Signed)
Reviewed results of diagnostic mammogram without abnormalities. Pain on the R has nearly resolved with the prescribed localized pain medications.

## 2022-09-19 NOTE — Patient Instructions (Signed)
Thank you, [name] for allowing [place] to provide your care today. Today we discussed your diabetes, your blood pressure, your weight, and your cholesterol. Be proud of the work you are doing to take care of yourself!   I have ordered the following labs for you:   Lab Orders         Glucose, capillary         BMP8+Anion Gap         Lipid Profile         Microalbumin / Creatinine Urine Ratio         POC Hbg A1C      I will call if any are abnormal. All of your labs can be accessed through "My Chart".   My Chart Access: [email or web address]  Please follow-up in: 3 months for chronic medical condition follow up    We look forward to seeing you next time. Please call our clinic at [phone] if you have any questions or concerns. The best time to call is Monday-Friday from 9am-4pm, but there is someone available 24/7. If after hours or the weekend, call the main hospital number and ask for the Internal Medicine Resident On-Call. If you need medication refills, please notify your pharmacy one week in advance and they will send [place] a request.   Thank you for letting us take part in your care. Wishing you the best!  Morene Crocker, MD 09/19/2022, 10:33 AM [facility] Internal Medicine Resident, PGY-1

## 2022-09-20 ENCOUNTER — Other Ambulatory Visit: Payer: Self-pay | Admitting: Student

## 2022-09-20 DIAGNOSIS — G4733 Obstructive sleep apnea (adult) (pediatric): Secondary | ICD-10-CM

## 2022-09-20 NOTE — Progress Notes (Signed)
Called patient and aware of normal lab results.

## 2022-09-24 NOTE — Progress Notes (Signed)
Internal Medicine Clinic Attending  Case discussed with Dr. Gomez-Caraballo  At the time of the visit.  We reviewed the resident's history and exam and pertinent patient test results.  I agree with the assessment, diagnosis, and plan of care documented in the resident's note.  

## 2022-11-19 ENCOUNTER — Other Ambulatory Visit: Payer: Self-pay | Admitting: Student

## 2022-11-19 DIAGNOSIS — E119 Type 2 diabetes mellitus without complications: Secondary | ICD-10-CM

## 2022-11-19 NOTE — Telephone Encounter (Signed)
Next appt scheduled 10/30 with Dr Sloan Leiter.

## 2022-11-20 ENCOUNTER — Other Ambulatory Visit (HOSPITAL_COMMUNITY): Payer: Self-pay

## 2022-11-20 MED ORDER — MOUNJARO 10 MG/0.5ML ~~LOC~~ SOAJ
10.0000 mg | SUBCUTANEOUS | 2 refills | Status: DC
Start: 2022-11-20 — End: 2023-02-08
  Filled 2022-11-20: qty 2, 28d supply, fill #0
  Filled 2022-12-14: qty 2, 28d supply, fill #1
  Filled 2023-01-11: qty 2, 28d supply, fill #2

## 2022-12-19 ENCOUNTER — Ambulatory Visit: Payer: Medicare (Managed Care) | Admitting: Internal Medicine

## 2022-12-19 ENCOUNTER — Encounter: Payer: Self-pay | Admitting: Internal Medicine

## 2022-12-19 VITALS — BP 128/67 | HR 94 | Temp 98.2°F | Wt 228.3 lb

## 2022-12-19 DIAGNOSIS — E611 Iron deficiency: Secondary | ICD-10-CM | POA: Diagnosis not present

## 2022-12-19 DIAGNOSIS — E119 Type 2 diabetes mellitus without complications: Secondary | ICD-10-CM

## 2022-12-19 DIAGNOSIS — Z0000 Encounter for general adult medical examination without abnormal findings: Secondary | ICD-10-CM

## 2022-12-19 DIAGNOSIS — I10 Essential (primary) hypertension: Secondary | ICD-10-CM | POA: Diagnosis not present

## 2022-12-19 DIAGNOSIS — Z794 Long term (current) use of insulin: Secondary | ICD-10-CM | POA: Diagnosis not present

## 2022-12-19 NOTE — Assessment & Plan Note (Signed)
History of IDA in 2019 thought to be insetting of dietary intake and hemorrhoids. She had colonoscopy with Dr. Myrtie Neither in 2020 that had 1 benign polyp. She continues to take daily iron tablets. P: Recheck CBC and iron studies at follow up in january

## 2022-12-19 NOTE — Assessment & Plan Note (Signed)
She plans to go to pharmacy to get tetanus shot updated

## 2022-12-19 NOTE — Patient Instructions (Signed)
Keep up the great work with exercising weekly. This will help your blood pressure and diabetes control. I am not making an changes to your medications today Follow-up in January- Feb to repeat A1c and check on blood count  Please go to pharmacy for tetanus shot. This is one to get every 10 years to prevent Tetanus

## 2022-12-19 NOTE — Progress Notes (Signed)
Subjective:  CC: htn and HLD follow-up  HPI:  Kristen Frost is a 67 y.o. female with a past medical history stated below and presents today for follow-up on blood pressure and cholesterol. She was last seen by PCP in July, her blood pressure was mildly elevated at that visit.  She continues to go to senior center for exercises classes 4 times weekly. Her weight is stable from last visit.  Please see problem based assessment and plan for additional details.  Past Medical History:  Diagnosis Date   Anxiety    Arthritis    Bleeding hemorrhoids    grade 2   Diverticulosis of colon    Full dentures    History of colon polyps    Hyperlipidemia    Hypertension    followed by pcp   (04-30-2019  per pt had stress test approx. 2005, was told normal (not available in epic)   IDA (iron deficiency anemia)    OSA on CPAP    followed by dr Craige Cotta ([facility])    (04-30-2019  pt  stated uses every night)   Restless leg syndrome    Type 2 diabetes mellitus (HCC)    followed by pcp   (04-30-2019  pt stated does not check blood sugar at home)    Current Outpatient Medications on File Prior to Visit  Medication Sig Dispense Refill   amLODipine (NORVASC) 10 MG tablet TAKE 1 TABLET BY MOUTH DAILY 90 tablet 3   atorvastatin (LIPITOR) 40 MG tablet Take 1 tablet (40 mg total) by mouth daily. 90 tablet 3   cetirizine (ZYRTEC ALLERGY) 10 MG tablet Take 1 tablet (10 mg total) by mouth daily. (Patient not taking: Reported on 09/05/2022) 90 tablet 3   Cholecalciferol (VITAMIN D) 2000 units CAPS Take 1 capsule (2,000 Units total) by mouth daily. (Patient taking differently: Take 2,000 Units by mouth daily.) 90 capsule 1   diclofenac Sodium (VOLTAREN) 1 % GEL Apply 4 g topically 4 (four) times daily. 50 g 1   ferrous sulfate 325 (65 FE) MG tablet Take 1 tablet (325 mg total) by mouth daily with breakfast. (Patient taking differently: Take 325 mg by mouth daily with breakfast. ) 90 tablet 1    fluticasone (FLONASE) 50 MCG/ACT nasal spray SHAKE LIQUID AND USE 1 SPRAY IN EACH NOSTRIL DAILY AS NEEDED 16 g 3   Insulin Pen Needle (PEN NEEDLES) 31G X 5 MM MISC 1 application by Does not apply route daily. 90 each 1   tirzepatide (MOUNJARO) 10 MG/0.5ML Pen Inject 10 mg into the skin once a week. 2 mL 2   No current facility-administered medications on file prior to visit.    Family History  Problem Relation Age of Onset   Allergies Father    Alcohol abuse Father    Tuberculosis Father        deceased from TB   Allergies Other        sibling   Asthma Other        sibling   Hypertension Other        sibling   Migraines Other        sibling   Rashes / Skin problems Other        sibling   Gout Other        sibling    Social History   Socioeconomic History   Marital status: Widowed    Spouse name: Not on file   Number of children: 5  Years of education: Not on file   Highest education level: Not on file  Occupational History   Occupation: unemployeed  Tobacco Use   Smoking status: Never   Smokeless tobacco: Never  Vaping Use   Vaping status: Never Used  Substance and Sexual Activity   Alcohol use: No   Drug use: Never   Sexual activity: Not on file  Other Topics Concern   Not on file  Social History Narrative   Has 5 children and 1 step child.  Husband passed recently.   Social Determinants of Health   Financial Resource Strain: Low Risk  (09/05/2022)   Overall Financial Resource Strain (CARDIA)    Difficulty of Paying Living Expenses: Not very hard  Food Insecurity: No Food Insecurity (09/05/2022)   Hunger Vital Sign    Worried About Running Out of Food in the Last Year: Never true    Ran Out of Food in the Last Year: Never true  Transportation Needs: Unmet Transportation Needs (09/05/2022)   PRAPARE - Transportation    Lack of Transportation (Medical): Yes    Lack of Transportation (Non-Medical): Yes  Physical Activity: Sufficiently Active (09/05/2022)    Exercise Vital Sign    Days of Exercise per Week: 4 days    Minutes of Exercise per Session: 40 min  Stress: No Stress Concern Present (09/05/2022)   [employer] of Occupational Health - Occupational Stress Questionnaire    Feeling of Stress : Not at all  Social Connections: Moderately Integrated (09/05/2022)   Social Connection and Isolation Panel [NHANES]    Frequency of Communication with Friends and Family: More than three times a week    Frequency of Social Gatherings with Friends and Family: More than three times a week    Attends Religious Services: More than 4 times per year    Active Member of [employer] or Organizations: Yes    Attends Banker Meetings: 1 to 4 times per year    Marital Status: Widowed  Intimate Partner Violence: Not At Risk (09/05/2022)   Humiliation, Afraid, Rape, and Kick questionnaire    Fear of Current or Ex-Partner: No    Emotionally Abused: No    Physically Abused: No    Sexually Abused: No    Review of Systems: ROS negative except for what is noted on the assessment and plan.  Objective:   Vitals:   12/19/22 0836  BP: 128/67  Pulse: 94  Temp: 98.2 F (36.8 C)  TempSrc: Oral  SpO2: 100%  Weight: 228 lb 4.8 oz (103.6 kg)    Physical Exam: Constitutional: well-appearing  Cardiovascular: regular rate and rhythm, no m/r/g Pulmonary/Chest: normal work of breathing on room air, lungs clear to auscultation bilaterally MSK: normal bulk and tone, no lower extremity edema Neurological: normal gait Skin: warm and dry    Assessment & Plan:  Healthcare maintenance She plans to go to pharmacy to get tetanus shot updated  HTN (hypertension) Home mediations include amlodipine 10 mg every day. Last BMP 3 months ago with stable kidney function. BP Readings from Last 3 Encounters:  12/19/22 128/67  09/19/22 132/79  09/05/22 139/61    Blood pressure is well controlled today. P: Continue amlodipine 10 mg qd  Iron  deficiency History of IDA in 2019 thought to be insetting of dietary intake and hemorrhoids. She had colonoscopy with Dr. Myrtie Neither in 2020 that had 1 benign polyp. She continues to take daily iron tablets. P: Recheck CBC and iron studies at follow up in january  T2DM (type 2 diabetes mellitus) (HCC) F/u in January for repeat A1c.   Patient discussed with Dr. Marjorie Smolder Diera Wirkkala, D.O. [facility]  PGY-3 Pager: [phone]  Phone: [phone] Date 12/19/2022  Time 9:09 AM

## 2022-12-19 NOTE — Assessment & Plan Note (Signed)
Home mediations include amlodipine 10 mg every day. Last BMP 3 months ago with stable kidney function. BP Readings from Last 3 Encounters:  12/19/22 128/67  09/19/22 132/79  09/05/22 139/61    Blood pressure is well controlled today. P: Continue amlodipine 10 mg qd

## 2022-12-19 NOTE — Assessment & Plan Note (Signed)
F/u in January for repeat A1c.

## 2022-12-24 LAB — HM DIABETES EYE EXAM

## 2022-12-30 NOTE — Progress Notes (Signed)
Internal Medicine Clinic Attending  Case discussed with the resident at the time of the visit.  We reviewed the resident's history and exam and pertinent patient test results.  I agree with the assessment, diagnosis, and plan of care documented in the resident's note.  

## 2023-01-10 ENCOUNTER — Encounter: Payer: Self-pay | Admitting: Dietician

## 2023-02-08 ENCOUNTER — Other Ambulatory Visit: Payer: Self-pay | Admitting: Student

## 2023-02-08 ENCOUNTER — Other Ambulatory Visit (HOSPITAL_COMMUNITY): Payer: Self-pay

## 2023-02-08 DIAGNOSIS — E119 Type 2 diabetes mellitus without complications: Secondary | ICD-10-CM

## 2023-02-08 MED ORDER — MOUNJARO 10 MG/0.5ML ~~LOC~~ SOAJ
10.0000 mg | SUBCUTANEOUS | 2 refills | Status: DC
  Filled 2023-02-08: qty 2, 28d supply, fill #0
  Filled 2023-03-03 – 2023-03-04 (×2): qty 2, 28d supply, fill #1
  Filled 2023-04-01: qty 2, 28d supply, fill #2

## 2023-03-04 ENCOUNTER — Encounter (HOSPITAL_COMMUNITY): Payer: Self-pay

## 2023-03-04 ENCOUNTER — Other Ambulatory Visit (HOSPITAL_COMMUNITY): Payer: Self-pay

## 2023-03-21 ENCOUNTER — Encounter: Payer: Medicare Other | Admitting: Internal Medicine

## 2023-03-25 ENCOUNTER — Other Ambulatory Visit (HOSPITAL_COMMUNITY): Payer: Self-pay

## 2023-03-25 ENCOUNTER — Ambulatory Visit (INDEPENDENT_AMBULATORY_CARE_PROVIDER_SITE_OTHER): Payer: Medicare Other | Admitting: Internal Medicine

## 2023-03-25 VITALS — BP 123/54 | HR 78 | Wt 227.2 lb

## 2023-03-25 DIAGNOSIS — I10 Essential (primary) hypertension: Secondary | ICD-10-CM | POA: Diagnosis not present

## 2023-03-25 DIAGNOSIS — Z862 Personal history of diseases of the blood and blood-forming organs and certain disorders involving the immune mechanism: Secondary | ICD-10-CM

## 2023-03-25 DIAGNOSIS — E611 Iron deficiency: Secondary | ICD-10-CM

## 2023-03-25 DIAGNOSIS — J302 Other seasonal allergic rhinitis: Secondary | ICD-10-CM

## 2023-03-25 DIAGNOSIS — Z7985 Long-term (current) use of injectable non-insulin antidiabetic drugs: Secondary | ICD-10-CM

## 2023-03-25 DIAGNOSIS — E119 Type 2 diabetes mellitus without complications: Secondary | ICD-10-CM

## 2023-03-25 LAB — POCT GLYCOSYLATED HEMOGLOBIN (HGB A1C): Hemoglobin A1C: 5.8 % — AB (ref 4.0–5.6)

## 2023-03-25 LAB — GLUCOSE, CAPILLARY: Glucose-Capillary: 100 mg/dL — ABNORMAL HIGH (ref 70–99)

## 2023-03-25 MED ORDER — CETIRIZINE HCL 10 MG PO TABS
10.0000 mg | ORAL_TABLET | ORAL | 3 refills | Status: AC | PRN
  Filled 2023-03-25: qty 100, 100d supply, fill #0

## 2023-03-25 NOTE — Assessment & Plan Note (Signed)
Diabetes remains under good control with A1c less than 6.  She is adherent with Mounjaro 10 mg and does not have side effects. Plan: Continue Mounjaro 10 mg weekly Follow-up in 6 months for repeat A1c

## 2023-03-25 NOTE — Progress Notes (Signed)
Subjective:  CC: diabetes  HPI:  Kristen Frost is a 68 y.o. female with a past medical history of diabetes, HLD, CKD stage 2, IDA who presents today for follow-up for diabetes.   Last seen 10/24 for HTN and iron deficiency. Blood pressure well controlled at that time.  Please see problem based assessment and plan for additional details.  Past Medical History:  Diagnosis Date   Anxiety    Arthritis    Bleeding hemorrhoids    grade 2   Diverticulosis of colon    Full dentures    History of colon polyps    Hyperlipidemia    Hypertension    followed by pcp   (04-30-2019  per pt had stress test approx. 2005, was told normal (not available in epic)   IDA (iron deficiency anemia)    OSA on CPAP    followed by dr Craige Cotta ([facility])    (04-30-2019  pt  stated uses every night)   Restless leg syndrome    Type 2 diabetes mellitus (HCC)    followed by pcp   (04-30-2019  pt stated does not check blood sugar at home)   MEDICATIONS:  Amlodipine 10 mg Fluticasone 50 mcg nasal spray Cetirizine 10 mg Tirzepatide 10 mg Atorvastatin 40 mg qd  Family History  Problem Relation Age of Onset   Allergies Father    Alcohol abuse Father    Tuberculosis Father        deceased from TB   Allergies Other        sibling   Asthma Other        sibling   Hypertension Other        sibling   Migraines Other        sibling   Rashes / Skin problems Other        sibling   Gout Other        sibling    Past Surgical History:  Procedure Laterality Date   CHOLECYSTECTOMY N/A 06/07/2016   Procedure: LAPAROSCOPIC CHOLECYSTECTOMY;  Surgeon: Violeta Gelinas, MD;  Location: [facility] OR;  Service: General;  Laterality: N/A;   COLONOSCOPY WITH PROPOFOL  last one 04-10-2018    dr Myrtie Neither   EYE SURGERY Bilateral 05/2022   Cataract surgery   ROTATOR CUFF REPAIR Left 2007   approx.   TRANSANAL HEMORRHOIDAL DEARTERIALIZATION N/A 05/07/2019   Procedure: TRANSANAL HEMORRHOIDAL DEARTERIALIZATION;   Surgeon: Romie Levee, MD;  Location: [facility];  Service: General;  Laterality: N/A;     Social History   Socioeconomic History   Marital status: Widowed    Spouse name: Not on file   Number of children: 5   Years of education: Not on file   Highest education level: Not on file  Occupational History   Occupation: unemployeed  Tobacco Use   Smoking status: Never   Smokeless tobacco: Never  Vaping Use   Vaping status: Never Used  Substance and Sexual Activity   Alcohol use: No   Drug use: Never   Sexual activity: Not on file  Other Topics Concern   Not on file  Social History Narrative   Has 5 children and 1 step child.  Husband passed recently.   Social Drivers of Health   Financial Resource Strain: Low Risk  (09/05/2022)   Overall Financial Resource Strain (CARDIA)    Difficulty of Paying Living Expenses: Not very hard  Food Insecurity: No Food Insecurity (09/05/2022)   Hunger Vital Sign  Worried About Programme researcher, broadcasting/film/video in the Last Year: Never true    Ran Out of Food in the Last Year: Never true  Transportation Needs: Unmet Transportation Needs (09/05/2022)   PRAPARE - Administrator, Civil Service (Medical): Yes    Lack of Transportation (Non-Medical): Yes  Physical Activity: Sufficiently Active (09/05/2022)   Exercise Vital Sign    Days of Exercise per Week: 4 days    Minutes of Exercise per Session: 40 min  Stress: No Stress Concern Present (09/05/2022)   [employer] of Occupational Health - Occupational Stress Questionnaire    Feeling of Stress : Not at all  Social Connections: Moderately Integrated (09/05/2022)   Social Connection and Isolation Panel [NHANES]    Frequency of Communication with Friends and Family: More than three times a week    Frequency of Social Gatherings with Friends and Family: More than three times a week    Attends Religious Services: More than 4 times per year    Active Member of [employer] or  Organizations: Yes    Attends Banker Meetings: 1 to 4 times per year    Marital Status: Widowed  Intimate Partner Violence: Not At Risk (09/05/2022)   Humiliation, Afraid, Rape, and Kick questionnaire    Fear of Current or Ex-Partner: No    Emotionally Abused: No    Physically Abused: No    Sexually Abused: No    Review of Systems: ROS negative except for what is noted on the assessment and plan.  Objective:   Vitals:   03/25/23 1000  BP: (!) 123/54  Pulse: 78  Weight: 227 lb 3.2 oz (103.1 kg)    Physical Exam: Constitutional: well-appearing Cardiovascular: regular rate and rhythm, no m/r/g Pulmonary/Chest: normal work of breathing on room air, lungs clear to auscultation bilaterally Abdominal: soft, non-tender, non-distended MSK: no lower extremity edema Neurological: alert & oriented x 3, normal gait Skin: warm and dry   Assessment & Plan:  Iron deficiency She is not taking supplement in the fall for iron.   P: Repeat CBC and iron panels She is due for follow-up with colonoscopy in 2027  T2DM (type 2 diabetes mellitus) (HCC) Diabetes remains under good control with A1c less than 6.  She is adherent with Mounjaro 10 mg and does not have side effects. Plan: Continue Mounjaro 10 mg weekly Follow-up in 6 months for repeat A1c  HTN (hypertension) Blood pressure well-controlled at 123/54.  She denies dizziness and is tolerating medication well.  She does not check her blood pressures at home. Plan: Continue amlodipine 10 mg   Patient discussed with Dr. Precious Bard Sartaj Hoskin, D.O. [facility]  PGY-3 Pager: [phone]  Phone: [phone] Date 03/25/2023  Time 12:11 PM

## 2023-03-25 NOTE — Assessment & Plan Note (Signed)
She is not taking supplement in the fall for iron.   P: Repeat CBC and iron panels She is due for follow-up with colonoscopy in 2027

## 2023-03-25 NOTE — Patient Instructions (Addendum)
Thank you, Ms.Susette Racer for allowing [place] to provide your care today.   Diabetes: Your diabetes remains very well controlled. Please continue taking mounjaro at same dose. We can move out checking diabetes labs to every 6 months.  History of low iron: I am rechecking your iron labs today. You will be due for repeat colonoscopy in 2027.  I have ordered the following labs for you:  Lab Orders         CBC no Diff         Iron, TIBC and Ferritin Panel         Glucose, capillary         POCT glycosylated hemoglobin (Hb A1C)       I have ordered the following medication/changed the following medications:   Stop the following medications: Medications Discontinued During This Encounter  Medication Reason   Insulin Pen Needle (PEN NEEDLES) 31G X 5 MM MISC Change in therapy   ferrous sulfate 325 (65 FE) MG tablet Completed Course   cetirizine (ZYRTEC ALLERGY) 10 MG tablet      Start the following medications: Meds ordered this encounter  Medications   cetirizine (ZYRTEC ALLERGY) 10 MG tablet    Sig: Take 1 tablet (10 mg total) by mouth as needed for allergies.    Dispense:  90 tablet    Refill:  3     Follow up: 6 months   We look forward to seeing you next time. Please call our clinic at [phone] if you have any questions or concerns. The best time to call is Monday-Friday from 9am-4pm, but there is someone available 24/7. If after hours or the weekend, call the main hospital number and ask for the Internal Medicine Resident On-Call. If you need medication refills, please notify your pharmacy one week in advance and they will send [place] a request.   Thank you for trusting me with your care. Wishing you the best!   Rudene Christians, DO [facility]

## 2023-03-25 NOTE — Assessment & Plan Note (Signed)
Blood pressure well-controlled at 123/54.  She denies dizziness and is tolerating medication well.  She does not check her blood pressures at home. Plan: Continue amlodipine 10 mg

## 2023-03-26 ENCOUNTER — Encounter: Payer: Medicare Other | Admitting: Internal Medicine

## 2023-03-26 LAB — CBC
Hematocrit: 41.1 % (ref 34.0–46.6)
Hemoglobin: 13.4 g/dL (ref 11.1–15.9)
MCH: 27.4 pg (ref 26.6–33.0)
MCHC: 32.6 g/dL (ref 31.5–35.7)
MCV: 84 fL (ref 79–97)
Platelets: 279 10*3/uL (ref 150–450)
RBC: 4.89 x10E6/uL (ref 3.77–5.28)
RDW: 14.3 % (ref 11.7–15.4)
WBC: 5.1 10*3/uL (ref 3.4–10.8)

## 2023-03-26 LAB — IRON,TIBC AND FERRITIN PANEL
Ferritin: 471 ng/mL — ABNORMAL HIGH (ref 15–150)
Iron Saturation: 26 % (ref 15–55)
Iron: 66 ug/dL (ref 27–139)
Total Iron Binding Capacity: 254 ug/dL (ref 250–450)
UIBC: 188 ug/dL (ref 118–369)

## 2023-04-01 ENCOUNTER — Other Ambulatory Visit (HOSPITAL_COMMUNITY): Payer: Self-pay

## 2023-04-03 ENCOUNTER — Other Ambulatory Visit (HOSPITAL_COMMUNITY): Payer: Self-pay

## 2023-05-10 ENCOUNTER — Other Ambulatory Visit (HOSPITAL_COMMUNITY): Payer: Self-pay

## 2023-05-10 ENCOUNTER — Other Ambulatory Visit: Payer: Self-pay | Admitting: Student

## 2023-05-10 DIAGNOSIS — E119 Type 2 diabetes mellitus without complications: Secondary | ICD-10-CM

## 2023-05-10 MED ORDER — MOUNJARO 10 MG/0.5ML ~~LOC~~ SOAJ
10.0000 mg | SUBCUTANEOUS | 2 refills | Status: DC
  Filled 2023-05-10: qty 2, 28d supply, fill #0
  Filled 2023-06-05: qty 2, 28d supply, fill #1
  Filled 2023-07-04: qty 2, 28d supply, fill #2

## 2023-05-10 NOTE — Telephone Encounter (Signed)
 Medication sent to pharmacy

## 2023-07-12 ENCOUNTER — Other Ambulatory Visit: Payer: Self-pay | Admitting: Student

## 2023-07-12 DIAGNOSIS — I10 Essential (primary) hypertension: Secondary | ICD-10-CM

## 2023-07-12 NOTE — Telephone Encounter (Signed)
 Medication sent to pharmacy

## 2023-08-08 ENCOUNTER — Other Ambulatory Visit: Payer: Self-pay | Admitting: Student

## 2023-08-08 ENCOUNTER — Other Ambulatory Visit (HOSPITAL_COMMUNITY): Payer: Self-pay

## 2023-08-08 DIAGNOSIS — E119 Type 2 diabetes mellitus without complications: Secondary | ICD-10-CM

## 2023-08-08 MED ORDER — MOUNJARO 10 MG/0.5ML ~~LOC~~ SOAJ
10.0000 mg | SUBCUTANEOUS | 2 refills | Status: DC
  Filled 2023-08-08: qty 2, 28d supply, fill #0
  Filled 2023-09-15: qty 2, 28d supply, fill #1
  Filled 2023-10-07: qty 2, 28d supply, fill #2

## 2023-08-09 ENCOUNTER — Other Ambulatory Visit (HOSPITAL_COMMUNITY): Payer: Self-pay

## 2023-09-02 ENCOUNTER — Other Ambulatory Visit (HOSPITAL_COMMUNITY): Payer: Self-pay

## 2023-09-11 ENCOUNTER — Ambulatory Visit: Payer: Medicare (Managed Care)

## 2023-09-11 VITALS — Ht 61.0 in | Wt 227.0 lb

## 2023-09-11 DIAGNOSIS — Z0000 Encounter for general adult medical examination without abnormal findings: Secondary | ICD-10-CM

## 2023-09-11 NOTE — Patient Instructions (Signed)
 [name] , Thank you for taking time out of your busy schedule to complete your Annual Wellness Visit with me. I enjoyed our conversation and look forward to speaking with you again next year. I, as well as your care team,  appreciate your ongoing commitment to your health goals. Please review the following plan we discussed and let me know if I can assist you in the future. Your Game plan/ To Do List    Referrals: If you haven't heard from the office you've been referred to, please reach out to them at the phone provided.   Follow up Visits: Next Medicare AWV with our clinical staff: 09/16/2024 at 1:00 pm phone visit with [facility]   Have you seen your provider in the last 6 months (3 months if uncontrolled diabetes)? Yes Next Office Visit with your provider: Office will call patient to schedule.  Clinician Recommendations:  Aim for 30 minutes of exercise or brisk walking, 6-8 glasses of water, and 5 servings of fruits and vegetables each day.       This is a list of the screening recommended for you and due dates:  Health Maintenance  Topic Date Due   COVID-19 Vaccine (1) Never done   Yearly kidney function blood test for diabetes  09/19/2023   Yearly kidney health urinalysis for diabetes  09/19/2023   Complete foot exam   09/19/2023   Flu Shot  09/20/2023   Hemoglobin A1C  09/22/2023   Eye exam for diabetics  12/24/2023   Medicare Annual Wellness Visit  09/10/2024   Mammogram  09/13/2024   Colon Cancer Screening  04/10/2025   DTaP/Tdap/Td vaccine (2 - Td or Tdap) 12/18/2032   Pneumococcal Vaccine for age over 8  Completed   DEXA scan (bone density measurement)  Completed   Hepatitis C Screening  Completed   Zoster (Shingles) Vaccine  Completed   Hepatitis B Vaccine  Aged Out   HPV Vaccine  Aged Out   Meningitis B Vaccine  Aged Out    Advanced directives: (Copy Requested) Please bring a copy of your health care power of attorney and living will to the office to be added to your  chart at your convenience. You can mail to [facility] [address] [place], [place] [address] or email to [email or web address] Advance Care Planning is important because it:  [x]  Makes sure you receive the medical care that is consistent with your values, goals, and preferences  [x]  It provides guidance to your family and loved ones and reduces their decisional burden about whether or not they are making the right decisions based on your wishes.  Follow the link provided in your after visit summary or read over the paperwork we have mailed to you to help you started getting your Advance Directives in place. If you need assistance in completing these, please reach out to us  so that we can help you!  See attachments for Preventive Care and Fall Prevention Tips.

## 2023-09-11 NOTE — Progress Notes (Signed)
 Because this visit was a virtual/telehealth visit,  certain criteria was not obtained, such a blood pressure, CBG if applicable, and timed get up and go. Any medications not marked as taking were not mentioned during the medication reconciliation part of the visit. Any vitals not documented were not able to be obtained due to this being a telehealth visit or patient was unable to self-report a recent blood pressure reading due to a lack of equipment at home via telehealth. Vitals that have been documented are verbally provided by the patient.   Subjective:   Kristen Frost is a 68 y.o. who presents for a Medicare Wellness preventive visit.  As a reminder, Annual Wellness Visits don't include a physical exam, and some assessments may be limited, especially if this visit is performed virtually. We may recommend an in-person follow-up visit with your provider if needed.  Visit Complete: Virtual I connected with  Kristen Frost on 09/11/23 by a audio enabled telemedicine application and verified that I am speaking with the correct person using two identifiers.  Patient Location: Home  Provider Location: Home Office  I discussed the limitations of evaluation and management by telemedicine. The patient expressed understanding and agreed to proceed.  Vital Signs: Because this visit was a virtual/telehealth visit, some criteria may be missing or patient reported. Any vitals not documented were not able to be obtained and vitals that have been documented are patient reported.  VideoDeclined- This patient declined Librarian, academic. Therefore the visit was completed with audio only.  Persons Participating in Visit: Patient.  AWV Questionnaire: No: Patient Medicare AWV questionnaire was not completed prior to this visit.  Cardiac Risk Factors include: advanced age (>26men, >64 women);dyslipidemia;diabetes mellitus;hypertension;obesity (BMI >30kg/m2);family  history of premature cardiovascular disease     Objective:    Today's Vitals   09/11/23 1302  Weight: 227 lb (103 kg)  Height: 5' 1 (1.549 m)  PainSc: 0-No pain   Body mass index is 42.89 kg/m.     09/11/2023    1:04 PM 12/19/2022    8:37 AM 09/19/2022    9:53 AM 09/05/2022    1:29 PM 06/08/2022    9:20 AM 05/08/2022    3:17 PM 04/09/2022    2:16 PM  Advanced Directives  Does Patient Have a Medical Advance Directive? Yes No No No No No No  Type of Estate agent of [place];Living will        Copy of Healthcare Power of Attorney in Chart? No - copy requested        Would patient like information on creating a medical advance directive?  No - Patient declined No - Patient declined Yes (MAU/Ambulatory/Procedural Areas - Information given) No - Patient declined No - Patient declined No - Patient declined    Current Medications (verified) Outpatient Encounter Medications as of 09/11/2023  Medication Sig   amLODipine  (NORVASC ) 10 MG tablet TAKE 1 TABLET BY MOUTH DAILY   atorvastatin  (LIPITOR) 40 MG tablet Take 1 tablet (40 mg total) by mouth daily.   cetirizine  (ZYRTEC  ALLERGY) 10 MG tablet Take 1 tablet (10 mg total) by mouth as needed for allergies.   Cholecalciferol (VITAMIN D ) 2000 units CAPS Take 1 capsule (2,000 Units total) by mouth daily.   Cholecalciferol (VITAMIN D -3 PO) Take by mouth.   Collagen-Vitamin C-Biotin (COLLAGEN PO) Take by mouth.   fluticasone  (FLONASE ) 50 MCG/ACT nasal spray SHAKE LIQUID AND USE 1 SPRAY IN EACH NOSTRIL DAILY AS NEEDED  Multiple Vitamins-Minerals (HAIR SKIN & NAILS PO) Take by mouth.   tirzepatide  (MOUNJARO ) 10 MG/0.5ML Pen Inject 10 mg into the skin once a week.   diclofenac  Sodium (VOLTAREN ) 1 % GEL Apply 4 g topically 4 (four) times daily. (Patient not taking: Reported on 09/11/2023)   No facility-administered encounter medications on file as of 09/11/2023.    Allergies (verified) Patient has no known allergies.    History: Past Medical History:  Diagnosis Date   Anxiety    Arthritis    Bleeding hemorrhoids    grade 2   Diverticulosis of colon    Full dentures    History of colon polyps    Hyperlipidemia    Hypertension    followed by pcp   (04-30-2019  per pt had stress test approx. 2005, was told normal (not available in epic)   IDA (iron deficiency anemia)    OSA on CPAP    followed by dr shellia ([place] [facility])    (04-30-2019  pt  stated uses every night)   Restless leg syndrome    Type 2 diabetes mellitus (HCC)    followed by pcp   (04-30-2019  pt stated does not check blood sugar at home)   Past Surgical History:  Procedure Laterality Date   CHOLECYSTECTOMY N/A 06/07/2016   Procedure: LAPAROSCOPIC CHOLECYSTECTOMY;  Surgeon: Dann Hummer, MD;  Location: [facility] OR;  Service: General;  Laterality: N/A;   COLONOSCOPY WITH PROPOFOL   last one 04-10-2018    dr legrand   EYE SURGERY Bilateral 05/2022   Cataract surgery   ROTATOR CUFF REPAIR Left 2007   approx.   TRANSANAL HEMORRHOIDAL DEARTERIALIZATION N/A 05/07/2019   Procedure: TRANSANAL HEMORRHOIDAL DEARTERIALIZATION;  Surgeon: Debby Hila, MD;  Location: [facility];  Service: General;  Laterality: N/A;   Family History  Problem Relation Age of Onset   Allergies Father    Alcohol abuse Father    Tuberculosis Father        deceased from TB   Allergies Other        sibling   Asthma Other        sibling   Hypertension Other        sibling   Migraines Other        sibling   Rashes / Skin problems Other        sibling   Gout Other        sibling   Social History   Socioeconomic History   Marital status: Widowed    Spouse name: Not on file   Number of children: 5   Years of education: Not on file   Highest education level: Not on file  Occupational History   Occupation: unemployeed  Tobacco Use   Smoking status: Never   Smokeless tobacco: Never  Vaping Use   Vaping status: Never Used  Substance and  Sexual Activity   Alcohol use: No   Drug use: Never   Sexual activity: Not on file  Other Topics Concern   Not on file  Social History Narrative   Has 5 children and 1 step child.  Husband passed recently.   Social Drivers of Corporate investment banker Strain: Low Risk  (09/11/2023)   Overall Financial Resource Strain (CARDIA)    Difficulty of Paying Living Expenses: Not very hard  Food Insecurity: No Food Insecurity (09/11/2023)   Hunger Vital Sign    Worried About Running Out of Food in the Last Year: Never true  Ran Out of Food in the Last Year: Never true  Transportation Needs: Unmet Transportation Needs (09/11/2023)   PRAPARE - Transportation    Lack of Transportation (Medical): Yes    Lack of Transportation (Non-Medical): Yes  Physical Activity: Sufficiently Active (09/11/2023)   Exercise Vital Sign    Days of Exercise per Week: 4 days    Minutes of Exercise per Session: 40 min  Stress: No Stress Concern Present (09/11/2023)   [employer] of Occupational Health - Occupational Stress Questionnaire    Feeling of Stress: Not at all  Social Connections: Moderately Integrated (09/11/2023)   Social Connection and Isolation Panel    Frequency of Communication with Friends and Family: More than three times a week    Frequency of Social Gatherings with Friends and Family: More than three times a week    Attends Religious Services: More than 4 times per year    Active Member of [employer] or Organizations: Yes    Attends Banker Meetings: 1 to 4 times per year    Marital Status: Widowed    Tobacco Counseling Counseling given: Not Answered    Clinical Intake:  Pre-visit preparation completed: Yes  Pain : No/denies pain Pain Score: 0-No pain     BMI - recorded: 42.89 Nutritional Status: BMI > 30  Obese Nutritional Risks: None Diabetes: Yes CBG done?: No Did pt. bring in CBG monitor from home?: No  Lab Results  Component Value Date   HGBA1C 5.8 (A)  03/25/2023   HGBA1C 5.9 (A) 09/19/2022   HGBA1C 6.1 (A) 02/20/2022     How often do you need to have someone help you when you read instructions, pamphlets, or other written materials from your doctor or pharmacy?: 1 - Never  Interpreter Needed?: No  Information entered by :: Aroldo Galli N. Makenah Karas, LPN.   Activities of Daily Living     09/11/2023    1:08 PM 12/19/2022    8:37 AM  In your present state of health, do you have any difficulty performing the following activities:  Hearing? 0 1  Vision? 0 0  Difficulty concentrating or making decisions? 0 0  Comment Brain stimuating exercises: reading, puzzles   Walking or climbing stairs? 0 0  Dressing or bathing? 0 0  Doing errands, shopping? 0 0  Preparing Food and eating ? N   Using the Toilet? N   In the past six months, have you accidently leaked urine? N   Do you have problems with loss of bowel control? N   Managing your Medications? N   Managing your Finances? N   Housekeeping or managing your Housekeeping? N     Patient Care Team: Elnora Ip, MD as PCP - General (Student)  I have updated your Care Teams any recent Medical Services you may have received from other providers in the past year.     Assessment:   This is a routine wellness examination for [place].  Hearing/Vision screen Hearing Screening - Comments:: Some hearing difficulties in the right ear, no hearing aids.  Vision Screening - Comments:: Wears reading glasses - up to date with routine eye exams with [facility]    Goals Addressed             This Visit's Progress    09/11/23: To be medication free.         Depression Screen     09/11/2023    1:18 PM 12/19/2022    8:38 AM 09/19/2022  9:53 AM 09/05/2022    1:40 PM 06/08/2022    9:19 AM 04/09/2022    2:17 PM 02/20/2022    9:20 AM  PHQ 2/9 Scores  PHQ - 2 Score 0 0 0 0 0 0 0  PHQ- 9 Score 0   0 0 0 0    Fall Risk     09/11/2023    1:04 PM 12/19/2022    8:37 AM 09/19/2022     9:52 AM 09/05/2022    1:33 PM 05/08/2022    3:16 PM  Fall Risk   Falls in the past year? 0 0 0 0 0  Number falls in past yr: 0 0 0 0 0  Injury with Fall? 0 0 0 0 0  Risk for fall due to : No Fall Risks No Fall Risks  No Fall Risks   Follow up Falls evaluation completed Falls evaluation completed Falls evaluation completed Falls prevention discussed Falls evaluation completed    MEDICARE RISK AT HOME:  Medicare Risk at Home Any stairs in or around the home?: Yes If so, are there any without handrails?: No Home free of loose throw rugs in walkways, pet beds, electrical cords, etc?: Yes Adequate lighting in your home to reduce risk of falls?: Yes Life alert?: No Use of a cane, walker or w/c?: No Grab bars in the bathroom?: No Shower chair or bench in shower?: Yes Elevated toilet seat or a handicapped toilet?: Yes  TIMED UP AND GO:  Was the test performed?  No  Cognitive Function: Declined/Normal: No cognitive concerns noted by patient or family. Patient alert, oriented, able to answer questions appropriately and recall recent events. No signs of memory loss or confusion.    09/11/2023    1:18 PM  MMSE - Mini Mental State Exam  Not completed: Unable to complete        09/11/2023    1:21 PM 09/05/2022    1:35 PM 08/14/2021   10:38 AM  6CIT Screen  What Year? 0 points  0 points  What month? 0 points  0 points  What time? 0 points 0 points 0 points  Count back from 20 0 points 0 points   Months in reverse 0 points 0 points 0 points  Repeat phrase 0 points 2 points 0 points  Total Score 0 points      Immunizations Immunization History  Administered Date(s) Administered   PNEUMOCOCCAL CONJUGATE-20 02/20/2022   Pneumococcal Conjugate-13 01/18/2021   Tdap 12/19/2022   Zoster Recombinant(Shingrix) 06/08/2022, 08/18/2022    Screening Tests Health Maintenance  Topic Date Due   COVID-19 Vaccine (1) Never done   Diabetic kidney evaluation - eGFR measurement  09/19/2023    Diabetic kidney evaluation - Urine ACR  09/19/2023   FOOT EXAM  09/19/2023   INFLUENZA VACCINE  09/20/2023   HEMOGLOBIN A1C  09/22/2023   OPHTHALMOLOGY EXAM  12/24/2023   Medicare Annual Wellness (AWV)  09/10/2024   MAMMOGRAM  09/13/2024   Colonoscopy  04/10/2025   DTaP/Tdap/Td (2 - Td or Tdap) 12/18/2032   Pneumococcal Vaccine: 50+ Years  Completed   DEXA SCAN  Completed   Hepatitis C Screening  Completed   Zoster Vaccines- Shingrix  Completed   Hepatitis B Vaccines  Aged Out   HPV VACCINES  Aged Out   Meningococcal B Vaccine  Aged Out    Health Maintenance  Health Maintenance Due  Topic Date Due   COVID-19 Vaccine (1) Never done   Diabetic kidney evaluation - eGFR  measurement  09/19/2023   Diabetic kidney evaluation - Urine ACR  09/19/2023   Health Maintenance Items Addressed: Yes Patient aware of current care gaps.  Immunization record was verified by [employer].   Additional Screening:  Vision Screening: Recommended annual ophthalmology exams for early detection of glaucoma and other disorders of the eye. Would you like a referral to an eye doctor? No    Dental Screening: Recommended annual dental exams for proper oral hygiene  Community Resource Referral / Chronic Care Management: CRR required this visit?  No   [name] required this visit?  No   Plan:    I have personally reviewed and noted the following in the patient's chart:   Medical and social history Use of alcohol, tobacco or illicit drugs  Current medications and supplements including opioid prescriptions. Patient is not currently taking opioid prescriptions. Functional ability and status Nutritional status Physical activity Advanced directives List of other physicians Hospitalizations, surgeries, and ER visits in previous 12 months Vitals Screenings to include cognitive, depression, and falls Referrals and appointments  In addition, I have reviewed and discussed with patient certain preventive  protocols, quality metrics, and best practice recommendations. A written personalized care plan for preventive services as well as general preventive health recommendations were provided to patient.   Roz LOISE Fuller, LPN   [id number]/76/[id number]   After Visit Summary: (MyChart) Due to this being a telephonic visit, the after visit summary with patients personalized plan was offered to patient via MyChart   Notes: Patient aware of current care gaps.  Immunization record was verified by [employer].  Patient is due for the following Urine ACR, GFR and patient declined vaccines for Covid.

## 2023-09-27 NOTE — Progress Notes (Signed)
 This visit was performed by a medical professional under my direct supervision. I was immediately available for consultation/collaboration. I have reviewed and agree with the Annual Wellness Visit documentation.

## 2023-10-07 ENCOUNTER — Other Ambulatory Visit: Payer: Self-pay | Admitting: Internal Medicine

## 2023-10-07 DIAGNOSIS — Z1231 Encounter for screening mammogram for malignant neoplasm of breast: Secondary | ICD-10-CM

## 2023-10-18 ENCOUNTER — Other Ambulatory Visit (HOSPITAL_COMMUNITY): Payer: Self-pay

## 2023-10-18 ENCOUNTER — Ambulatory Visit (INDEPENDENT_AMBULATORY_CARE_PROVIDER_SITE_OTHER): Payer: Self-pay | Admitting: Student

## 2023-10-18 VITALS — BP 122/70 | HR 75 | Temp 97.5°F | Wt 221.4 lb

## 2023-10-18 DIAGNOSIS — Z7985 Long-term (current) use of injectable non-insulin antidiabetic drugs: Secondary | ICD-10-CM | POA: Diagnosis not present

## 2023-10-18 DIAGNOSIS — G4733 Obstructive sleep apnea (adult) (pediatric): Secondary | ICD-10-CM | POA: Diagnosis not present

## 2023-10-18 DIAGNOSIS — Z6841 Body Mass Index (BMI) 40.0 and over, adult: Secondary | ICD-10-CM

## 2023-10-18 DIAGNOSIS — H9041 Sensorineural hearing loss, unilateral, right ear, with unrestricted hearing on the contralateral side: Secondary | ICD-10-CM | POA: Diagnosis not present

## 2023-10-18 DIAGNOSIS — I10 Essential (primary) hypertension: Secondary | ICD-10-CM | POA: Diagnosis not present

## 2023-10-18 DIAGNOSIS — E66813 Obesity, class 3: Secondary | ICD-10-CM

## 2023-10-18 DIAGNOSIS — E785 Hyperlipidemia, unspecified: Secondary | ICD-10-CM | POA: Diagnosis not present

## 2023-10-18 DIAGNOSIS — E119 Type 2 diabetes mellitus without complications: Secondary | ICD-10-CM | POA: Diagnosis present

## 2023-10-18 LAB — POCT GLYCOSYLATED HEMOGLOBIN (HGB A1C): HbA1c, POC (controlled diabetic range): 5.7 % (ref 0.0–7.0)

## 2023-10-18 LAB — GLUCOSE, CAPILLARY: Glucose-Capillary: 83 mg/dL (ref 70–99)

## 2023-10-18 MED ORDER — ATORVASTATIN CALCIUM 40 MG PO TABS: 40.0000 mg | ORAL_TABLET | Freq: Every day | ORAL | 3 refills | Status: AC

## 2023-10-18 MED ORDER — MOUNJARO 12.5 MG/0.5ML ~~LOC~~ SOAJ
12.5000 mg | SUBCUTANEOUS | 11 refills | Status: AC
  Filled 2023-10-18: qty 2, 28d supply, fill #0
  Filled 2023-11-14: qty 2, 28d supply, fill #1
  Filled 2023-12-12: qty 2, 28d supply, fill #2
  Filled 2024-01-09: qty 2, 28d supply, fill #3
  Filled 2024-02-03: qty 2, 28d supply, fill #4
  Filled 2024-02-04: qty 2, 28d supply, fill #0
  Filled 2024-03-04: qty 2, 28d supply, fill #1

## 2023-10-18 NOTE — Patient Instructions (Addendum)
 Thank you, Kristen Frost for allowing us  to provide your care today. Today we discussed   Diabetes and weight management.  Increase your Mounjaro  dose to 12.5 mg weekly- sent to [address]  I also refilled your Lipitor and Amlodipine  - sent to your other pharmacy  Kristen Frost will check about the supplies for your cPAP. We should have more information when I chat with you in about 2 weeks.  I have ordered the following labs for you:   Lab Orders         Glucose, capillary         Microalbumin / Creatinine Urine Ratio         BMP8         Lipid Profile         POC Hbg A1C      I will call if any are abnormal. All of your labs can be accessed through My Chart.   My Chart Access: [email or web address]  Please follow-up in: 2 weeks telehealth and ~ 6 months with me for chronic condition follow up    We look forward to seeing you next time. Please call our clinic at [phone] if you have any questions or concerns. The best time to call is Monday-Friday from 9am-4pm, but there is someone available 24/7. If after hours or the weekend, call the main hospital number and ask for the Internal Medicine Resident On-Call. If you need medication refills, please notify your pharmacy one week in advance and they will send us  a request.   Thank you for letting us  take part in your care. Wishing you the best!  Kristen Ip, MD 10/18/2023, 11:39 AM [facility]

## 2023-10-19 LAB — LIPID PANEL
Chol/HDL Ratio: 2.5 ratio (ref 0.0–4.4)
Cholesterol, Total: 148 mg/dL (ref 100–199)
HDL: 59 mg/dL (ref >39)
LDL Chol Calc (NIH): 76 mg/dL (ref 0–99)
Triglycerides: 66 mg/dL (ref 0–149)
VLDL Cholesterol Cal: 13 mg/dL (ref 5–40)

## 2023-10-19 LAB — MICROALBUMIN / CREATININE URINE RATIO
Creatinine, Urine: 141.9 mg/dL
Microalb/Creat Ratio: 8 mg/g{creat} (ref 0–29)
Microalbumin, Urine: 10.8 ug/mL

## 2023-10-19 LAB — BMP8
BUN: 14 mg/dL (ref 8–27)
CO2: 23 mmol/L (ref 20–29)
Calcium: 9.9 mg/dL (ref 8.7–10.3)
Chloride: 105 mmol/L (ref 96–106)
Creatinine, Ser: 1.11 mg/dL — ABNORMAL HIGH (ref 0.57–1.00)
Glucose: 92 mg/dL (ref 70–99)
Potassium: 4.1 mmol/L (ref 3.5–5.2)
Sodium: 143 mmol/L (ref 134–144)
eGFR: 54 mL/min/1.73 — ABNORMAL LOW (ref >59)

## 2023-10-21 NOTE — Progress Notes (Signed)
 Subjective:  CC: Diabetes follow up  HPI:  [name] is a 68 y.o. female with a past medical history stated below and presents today for diabetes follow up. Please see problem based assessment and plan for additional details.  Past Medical History:  Diagnosis Date   Anxiety    Arthritis    Bleeding hemorrhoids    grade 2   Diverticulosis of colon    Full dentures    History of colon polyps    Hyperlipidemia    Hypertension    followed by pcp   (04-30-2019  per pt had stress test approx. 2005, was told normal (not available in epic)   IDA (iron deficiency anemia)    OSA on CPAP    followed by dr shellia ([place] [facility])    (04-30-2019  pt  stated uses every night)   Restless leg syndrome    Type 2 diabetes mellitus (HCC)    followed by pcp   (04-30-2019  pt stated does not check blood sugar at home)    Current Outpatient Medications on File Prior to Visit  Medication Sig Dispense Refill   amLODipine  (NORVASC ) 10 MG tablet TAKE 1 TABLET BY MOUTH DAILY 90 tablet 3   cetirizine  (ZYRTEC  ALLERGY) 10 MG tablet Take 1 tablet (10 mg total) by mouth as needed for allergies. 100 tablet 3   Cholecalciferol (VITAMIN D ) 2000 units CAPS Take 1 capsule (2,000 Units total) by mouth daily. 90 capsule 1   Cholecalciferol (VITAMIN D -3 PO) Take by mouth.     Collagen-Vitamin C-Biotin (COLLAGEN PO) Take by mouth.     diclofenac  Sodium (VOLTAREN ) 1 % GEL Apply 4 g topically 4 (four) times daily. (Patient not taking: Reported on 09/11/2023) 50 g 1   fluticasone  (FLONASE ) 50 MCG/ACT nasal spray SHAKE LIQUID AND USE 1 SPRAY IN EACH NOSTRIL DAILY AS NEEDED 16 g 3   Multiple Vitamins-Minerals (HAIR SKIN & NAILS PO) Take by mouth.     No current facility-administered medications on file prior to visit.    Family History  Problem Relation Age of Onset   Allergies Father    Alcohol abuse Father    Tuberculosis Father        deceased from TB   Allergies Other        sibling   Asthma  Other        sibling   Hypertension Other        sibling   Migraines Other        sibling   Rashes / Skin problems Other        sibling   Gout Other        sibling    Social History   Socioeconomic History   Marital status: Widowed    Spouse name: Not on file   Number of children: 5   Years of education: Not on file   Highest education level: Not on file  Occupational History   Occupation: unemployeed  Tobacco Use   Smoking status: Never   Smokeless tobacco: Never  Vaping Use   Vaping status: Never Used  Substance and Sexual Activity   Alcohol use: No   Drug use: Never   Sexual activity: Not on file  Other Topics Concern   Not on file  Social History Narrative   Has 5 children and 1 step child.  Husband passed recently.   Social Drivers of Health   Financial Resource Strain: Low Risk  (09/11/2023)  Overall Financial Resource Strain (CARDIA)    Difficulty of Paying Living Expenses: Not very hard  Food Insecurity: No Food Insecurity (09/11/2023)   Hunger Vital Sign    Worried About Running Out of Food in the Last Year: Never true    Ran Out of Food in the Last Year: Never true  Transportation Needs: Unmet Transportation Needs (09/11/2023)   PRAPARE - Transportation    Lack of Transportation (Medical): Yes    Lack of Transportation (Non-Medical): Yes  Physical Activity: Sufficiently Active (09/11/2023)   Exercise Vital Sign    Days of Exercise per Week: 4 days    Minutes of Exercise per Session: 40 min  Stress: No Stress Concern Present (09/11/2023)   [employer] of Occupational Health - Occupational Stress Questionnaire    Feeling of Stress: Not at all  Social Connections: Moderately Integrated (09/11/2023)   Social Connection and Isolation Panel    Frequency of Communication with Friends and Family: More than three times a week    Frequency of Social Gatherings with Friends and Family: More than three times a week    Attends Religious Services: More than 4  times per year    Active Member of [employer] or Organizations: Yes    Attends Banker Meetings: 1 to 4 times per year    Marital Status: Widowed  Intimate Partner Violence: Not At Risk (09/11/2023)   Humiliation, Afraid, Rape, and Kick questionnaire    Fear of Current or Ex-Partner: No    Emotionally Abused: No    Physically Abused: No    Sexually Abused: No    Review of Systems: ROS negative except for what is noted on the assessment and plan.  Objective:   Vitals:   10/18/23 1044  BP: 122/70  Pulse: 75  Temp: (!) 97.5 F (36.4 C)  TempSrc: Oral  SpO2: 99%  Weight: 221 lb 6.4 oz (100.4 kg)    Physical Exam: Constitutional: well-appearing woman sitting in chair, in no acute distress HENT: normocephalic atraumatic, mucous membranes moist Eyes: conjunctiva non-erythematous Neck: supple Cardiovascular: regular rate and rhythm, no m/r/g Pulmonary/Chest: normal work of breathing on room air, lungs clear to auscultation bilaterally Abdominal: soft, non-tender, non-distended MSK: normal bulk and tone Neurological: alert & oriented x 3,normal gait Skin: warm and dry Psych: Pleasant mood and affect       10/21/2023    6:58 PM  Depression screen PHQ 2/9  Decreased Interest 0  Down, Depressed, Hopeless 0  PHQ - 2 Score 0       Assessment & Plan:   HTN (hypertension) Well controlled on Amlodipine  without adverse effects. Vitals:   10/18/23 1044  BP: 122/70    - Continue medication  OSA (obstructive sleep apnea) Adherent to therapy. Continues to see Dr. Shellia, now at [facility],. Per his last note, patient is to change her PAP machine as the one she is using is 68 years old. Patient is also having difficulty  with PAP machine supplies.  - Will discuss with Ms. Chilon to help us  with this information  T2DM (type 2 diabetes mellitus) (HCC) Lab Results  Component Value Date   HGBA1C 5.7 10/18/2023   HGBA1C 5.8 (A) 03/25/2023   HGBA1C 5.9 (A) 09/19/2022   Well  controlled. On Mounjaro  and statin. Normal range urine ACR. Kidney function at baseline.  Patient continues to work on lifestyle modifications. Tolerating Mounjaro  without GI side effects.  - Will increase Mounjaro  dose to 12.5 mg weekly - Patient advised to  follow up via phone or MyChart if she experiences side effects  Sensorineural hearing loss (SNHL) of right ear Has upcoming audiology appointment  Class 3 severe obesity with serious comorbidity and body mass index (BMI) of 40.0 to 44.9 in adult Continues to work on lifestyle modifications. Her weight has plateaued over the past few months. Will increase dose of Mounjaro  today to 12.5 mg weekly. Would benefit of q3 months check in for accountability and progress with weight loss goals. Patient is motivated.    Return in about 2 weeks (around 11/01/2023) for Telehealth in 2 weeks for medication follow up and then in ~ 6 months with me for chronic condition.  Patient discussed with Dr. Trudy Ip Kristy Ahr, MD [facility] Internal Medicine Residency Program

## 2023-10-21 NOTE — Assessment & Plan Note (Signed)
 Adherent to therapy. Continues to see Dr. Shellia, now at [facility],. Per his last note, patient is to change her PAP machine as the one she is using is 68 years old. Patient is also having difficulty  with PAP machine supplies.  - Will discuss with Ms. Chilon to help us  with this information

## 2023-10-21 NOTE — Assessment & Plan Note (Signed)
 Well controlled on Amlodipine  without adverse effects. Vitals:   10/18/23 1044  BP: 122/70    - Continue medication

## 2023-10-21 NOTE — Assessment & Plan Note (Signed)
 Continues to work on lifestyle modifications. Her weight has plateaued over the past few months. Will increase dose of Mounjaro  today to 12.5 mg weekly. Would benefit of q3 months check in for accountability and progress with weight loss goals. Patient is motivated.

## 2023-10-21 NOTE — Assessment & Plan Note (Signed)
 Lab Results  Component Value Date   HGBA1C 5.7 10/18/2023   HGBA1C 5.8 (A) 03/25/2023   HGBA1C 5.9 (A) 09/19/2022   Well controlled. On Mounjaro  and statin. Normal range urine ACR. Kidney function at baseline.  Patient continues to work on lifestyle modifications. Tolerating Mounjaro  without GI side effects.  - Will increase Mounjaro  dose to 12.5 mg weekly - Patient advised to follow up via phone or MyChart if she experiences side effects

## 2023-10-21 NOTE — Assessment & Plan Note (Signed)
 Has upcoming audiology appointment.

## 2023-10-25 NOTE — Progress Notes (Signed)
 Internal Medicine Clinic Attending  Case discussed with the resident at the time of the visit.  We reviewed the resident's history and exam and pertinent patient test results.  I agree with the assessment, diagnosis, and plan of care documented in the resident's note.

## 2023-10-30 ENCOUNTER — Ambulatory Visit
Admission: RE | Admit: 2023-10-30 | Discharge: 2023-10-30 | Disposition: A | Discharge status: Home / Self Care | Source: Ambulatory Visit | Attending: Internal Medicine | Admitting: Internal Medicine

## 2023-10-30 DIAGNOSIS — Z1231 Encounter for screening mammogram for malignant neoplasm of breast: Secondary | ICD-10-CM

## 2023-10-31 ENCOUNTER — Ambulatory Visit

## 2023-11-01 ENCOUNTER — Encounter: Payer: Self-pay | Admitting: Student

## 2023-11-01 ENCOUNTER — Ambulatory Visit (INDEPENDENT_AMBULATORY_CARE_PROVIDER_SITE_OTHER): Admitting: Student

## 2023-11-01 DIAGNOSIS — E669 Obesity, unspecified: Secondary | ICD-10-CM | POA: Diagnosis not present

## 2023-11-01 DIAGNOSIS — E1169 Type 2 diabetes mellitus with other specified complication: Secondary | ICD-10-CM | POA: Diagnosis not present

## 2023-11-01 DIAGNOSIS — G4733 Obstructive sleep apnea (adult) (pediatric): Secondary | ICD-10-CM | POA: Diagnosis present

## 2023-11-01 DIAGNOSIS — Z7985 Long-term (current) use of injectable non-insulin antidiabetic drugs: Secondary | ICD-10-CM | POA: Diagnosis not present

## 2023-11-01 DIAGNOSIS — E1159 Type 2 diabetes mellitus with other circulatory complications: Secondary | ICD-10-CM

## 2023-11-01 DIAGNOSIS — I152 Hypertension secondary to endocrine disorders: Secondary | ICD-10-CM

## 2023-11-01 NOTE — Progress Notes (Signed)
  [facility] Residency Telephone Encounter Continuity Care Appointment  HPI:  This telephone encounter was created for [name] on 11/01/2023 for the following purpose/cc medication follow-up and OSA.   Past Medical History:  Past Medical History:  Diagnosis Date   Anxiety    Arthritis    Bleeding hemorrhoids    grade 2   Diverticulosis of colon    Full dentures    History of colon polyps    Hyperlipidemia    Hypertension    followed by pcp   (04-30-2019  per pt had stress test approx. 2005, was told normal (not available in epic)   IDA (iron deficiency anemia)    OSA on CPAP    followed by dr shellia ([facility])    (04-30-2019  pt  stated uses every night)   Restless leg syndrome    Type 2 diabetes mellitus (HCC)    followed by pcp   (04-30-2019  pt stated does not check blood sugar at home)     ROS:     Assessment / Plan / Recommendations:  OSA (obstructive sleep apnea) DME supply problem has been resolved.  However, patient continues to use CPAP machine.  Last titration study was in 2018; because of weight change to his doctor shellia recommended a new titration study. - Referral for sleep studies placed today  T2DM with obesity, OSA, and hypertension Patient tolerating increased dose of Mounjaro  12.5 mg weekly.  No further changes today -This patient can continue to be followed every 6 months for well-controlled diabetes   As always, pt is advised that if symptoms worsen or new symptoms arise, they should go to an urgent care facility or to to ER for further evaluation.   Consent and Medical Decision Making:  Patient discussed with Dr. Shawn This is a telephone encounter between Kristen Frost and Hadassah Ala ,MD on 11/01/2023 for diabetes medication management and OSA. The visit was conducted with the patient located at home and Hadassah Ala ,MD at [facility]. The patient's identity was confirmed using their DOB and  current address. The patient has consented to being evaluated through a telephone encounter and understands the associated risks (an examination cannot be done and the patient may need to come in for an appointment) / benefits (allows the patient to remain at home, decreasing exposure to coronavirus). I personally spent 15 minutes on medical discussion.

## 2023-11-01 NOTE — Assessment & Plan Note (Signed)
 Patient tolerating increased dose of Mounjaro  12.5 mg weekly.  No further changes today -This patient can continue to be followed every 6 months for well-controlled diabetes

## 2023-11-01 NOTE — Assessment & Plan Note (Signed)
 DME supply problem has been resolved.  However, patient continues to use CPAP machine.  Last titration study was in 2018; because of weight change to his doctor Shellia recommended a new titration study. - Referral for sleep studies placed today

## 2023-11-01 NOTE — Progress Notes (Signed)
 Internal Medicine Clinic Attending  Case discussed with the resident at the time of the visit.  We reviewed the resident's history and exam and pertinent patient test results.  I agree with the assessment, diagnosis, and plan of care documented in the resident's note.

## 2023-12-24 ENCOUNTER — Telehealth: Payer: Self-pay | Admitting: *Deleted

## 2023-12-24 NOTE — Telephone Encounter (Signed)
 RTC to patient.  Handicap [employer] form at front for patient and doctor completion.  Copied from CRM #[id number]. Topic: General - Other >> Dec 24, 2023 10:16 AM Kristen Frost wrote: Reason for CRM: Patient is calling because its time for her to fill out her handicap paperwork.Patients callback number is [phone].

## 2023-12-25 NOTE — Telephone Encounter (Signed)
 Patient disability form has been completed by Dr. Elicia. Pt came today to pick up the original copy.

## 2023-12-30 LAB — OPHTHALMOLOGY REPORT-SCANNED

## 2024-02-03 ENCOUNTER — Other Ambulatory Visit (HOSPITAL_COMMUNITY): Payer: Self-pay

## 2024-02-04 ENCOUNTER — Other Ambulatory Visit (HOSPITAL_COMMUNITY): Payer: Self-pay

## 2024-02-04 ENCOUNTER — Other Ambulatory Visit: Payer: Self-pay

## 2024-03-04 ENCOUNTER — Other Ambulatory Visit: Payer: Self-pay

## 2024-03-05 ENCOUNTER — Other Ambulatory Visit (HOSPITAL_COMMUNITY): Payer: Self-pay

## 2024-03-09 ENCOUNTER — Other Ambulatory Visit (HOSPITAL_COMMUNITY): Payer: Self-pay

## 2024-03-09 ENCOUNTER — Other Ambulatory Visit: Payer: Self-pay

## 2024-03-09 ENCOUNTER — Encounter: Payer: Self-pay | Admitting: Pharmacist

## 2024-09-16 ENCOUNTER — Ambulatory Visit
